# Patient Record
Sex: Female | Born: 2000 | Race: White | Hispanic: No | Marital: Married | Smoking: Never smoker
Health system: Southern US, Community
[De-identification: ages and names within clinical notes are randomized; demographics above are authoritative.]

## PROBLEM LIST (undated history)

## (undated) DIAGNOSIS — J45909 Unspecified asthma, uncomplicated: Secondary | ICD-10-CM

## (undated) DIAGNOSIS — D6800 Von Willebrand disease, unspecified: Secondary | ICD-10-CM

## (undated) DIAGNOSIS — F419 Anxiety disorder, unspecified: Secondary | ICD-10-CM

## (undated) DIAGNOSIS — M5127 Other intervertebral disc displacement, lumbosacral region: Secondary | ICD-10-CM

## (undated) DIAGNOSIS — Z1589 Genetic susceptibility to other disease: Secondary | ICD-10-CM

## (undated) DIAGNOSIS — I10 Essential (primary) hypertension: Secondary | ICD-10-CM

## (undated) DIAGNOSIS — K529 Noninfective gastroenteritis and colitis, unspecified: Secondary | ICD-10-CM

## (undated) DIAGNOSIS — K6389 Other specified diseases of intestine: Secondary | ICD-10-CM

## (undated) DIAGNOSIS — D689 Coagulation defect, unspecified: Secondary | ICD-10-CM

## (undated) DIAGNOSIS — E7212 Methylenetetrahydrofolate reductase deficiency: Secondary | ICD-10-CM

## (undated) DIAGNOSIS — T7840XA Allergy, unspecified, initial encounter: Secondary | ICD-10-CM

## (undated) HISTORY — DX: Von Willebrand's disease: D68.0

## (undated) HISTORY — DX: Von Willebrand disease, unspecified: D68.00

## (undated) HISTORY — DX: Allergy, unspecified, initial encounter: T78.40XA

## (undated) HISTORY — DX: Anxiety disorder, unspecified: F41.9

## (undated) HISTORY — DX: Other specified diseases of intestine: K63.89

## (undated) HISTORY — DX: Genetic susceptibility to other disease: Z15.89

## (undated) HISTORY — DX: Coagulation defect, unspecified: D68.9

## (undated) HISTORY — DX: Noninfective gastroenteritis and colitis, unspecified: K52.9

## (undated) HISTORY — DX: Methylenetetrahydrofolate reductase deficiency: E72.12

---
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
## 2019-01-17 ENCOUNTER — Telehealth: Payer: Self-pay

## 2019-01-17 DIAGNOSIS — Z01419 Encounter for gynecological examination (general) (routine) without abnormal findings: Secondary | ICD-10-CM

## 2019-01-17 MED ORDER — NORETHINDRONE ACET-ETHINYL EST 1-20 MG-MCG PO TABS: 1.0000 | ORAL_TABLET | Freq: Every day | ORAL | 1 refills | Status: DC

## 2019-01-17 NOTE — Telephone Encounter (Signed)
Pt has sched annual for 02/15/19 and needs refill of bc.  [phone]  Pt aware refill eRx'd.

## 2019-02-15 ENCOUNTER — Other Ambulatory Visit (HOSPITAL_COMMUNITY)
Admission: RE | Admit: 2019-02-15 | Discharge: 2019-02-15 | Disposition: A | Discharge status: Home / Self Care | Payer: BC Managed Care – PPO | Source: Ambulatory Visit | Attending: Obstetrics and Gynecology | Admitting: Obstetrics and Gynecology

## 2019-02-15 ENCOUNTER — Encounter: Payer: Self-pay | Admitting: Obstetrics and Gynecology

## 2019-02-15 ENCOUNTER — Other Ambulatory Visit: Payer: Self-pay

## 2019-02-15 ENCOUNTER — Ambulatory Visit (INDEPENDENT_AMBULATORY_CARE_PROVIDER_SITE_OTHER): Payer: BC Managed Care – PPO | Admitting: Obstetrics and Gynecology

## 2019-02-15 VITALS — BP 125/95 | HR 69 | Ht 61.0 in | Wt 168.0 lb

## 2019-02-15 DIAGNOSIS — Z30017 Encounter for initial prescription of implantable subdermal contraceptive: Secondary | ICD-10-CM | POA: Diagnosis not present

## 2019-02-15 DIAGNOSIS — Z01419 Encounter for gynecological examination (general) (routine) without abnormal findings: Secondary | ICD-10-CM

## 2019-02-15 DIAGNOSIS — Z1331 Encounter for screening for depression: Secondary | ICD-10-CM

## 2019-02-15 DIAGNOSIS — Z113 Encounter for screening for infections with a predominantly sexual mode of transmission: Secondary | ICD-10-CM

## 2019-02-15 DIAGNOSIS — Z1339 Encounter for screening examination for other mental health and behavioral disorders: Secondary | ICD-10-CM

## 2019-02-15 MED ORDER — NEXPLANON 68 MG ~~LOC~~ IMPL: 1.0000 | DRUG_IMPLANT | Freq: Once | SUBCUTANEOUS | 0 refills | Status: DC

## 2019-02-15 NOTE — Progress Notes (Signed)
Gynecology Annual Exam  PCP: Lorre Munroe, NP  Chief Complaint  Patient presents with  . Gynecologic Exam    Discuss birthcontrol options    History of Present Illness:  [name] is a 18 y.o. G0P0000 who LMP was Patient's last menstrual period was 01/29/2019., presents today for her annual examination.  Her menses are regular every 28-30 days, lasting 5 day(s).  Dysmenorrhea none. She does not have intermenstrual bleeding. She takes COCs.  She has von Willebrands Disease. She was put on contraception to help control her bleeding, given her disease state.  She used to have very heavy period, but never had a blood transfusion.    She is not sexually active.   Hx of STDs: none  There is no FH of breast cancer. There is no FH of ovarian cancer.   Tobacco use: The patient denies current or previous tobacco use. Alcohol use: none Exercise: not active  The patient wears seatbelts: yes.   The patient reports that domestic violence in her life is absent.   Patient is a 18 y.o. G0P0000 presenting for contraception consult.  She is currently on OCP (estrogen/progesterone) and desiring to start Nexplanon.  She has a past medical history significant for no contraindication to estrogen.  She specifically denies a history of migraine with aura, chronic hypertension, history of DVT/PE and smoking.  Reported Patient's last menstrual period was 01/29/2019.Marland Kitchen      Past Medical History:  Diagnosis Date  . Anxiety   . Epiploic appendagitis   . MTHFR mutation (HCC)   . Von Willebrand disease (HCC) Dr[facility] Janee [facility] at [facility]    Past Surgical History:  Procedure Laterality Date  . WISDOM TOOTH EXTRACTION  08/2018   no bleeding complications    Prior to Admission medications   Medication Sig Start Date End Date Taking? Authorizing Provider  ibuprofen (ADVIL,MOTRIN) 200 MG tablet Take 600 mg by mouth every 6 (six) hours as needed.   Yes [provider]  norethindrone-ethinyl  estradiol (JUNEL 1/20) 1-20 MG-MCG tablet Take 1 tablet by mouth daily. 01/17/19  Yes Oswaldo Conroy, CNM  ondansetron (ZOFRAN ODT) 4 MG disintegrating tablet Take 1 tablet (4 mg total) by mouth every 8 (eight) hours as needed for nausea or vomiting. 04/06/18  Yes Zehr, Princella Pellegrini, PA-C    Allergies  Allergen Reactions  . Tylenol [Acetaminophen] Other (See Comments)    fever   Obstetric History: G0P0000  Social History   Socioeconomic History  . Marital status: Single    Spouse name: Not on file  . Number of children: Not on file  . Years of education: Not on file  . Highest education level: Not on file  Occupational History  . Not on file  Social Needs  . Financial resource strain: Not on file  . Food insecurity    Worry: Not on file    Inability: Not on file  . Transportation needs    Medical: Not on file    Non-medical: Not on file  Tobacco Use  . Smoking status: Never Smoker  . Smokeless tobacco: Never Used  Substance and Sexual Activity  . Alcohol use: No    Alcohol/week: 0.0 standard drinks  . Drug use: No  . Sexual activity: Never    Birth control/protection: Pill  Lifestyle  . Physical activity    Days per week: Not on file    Minutes per session: Not on file  . Stress: Not on file  Relationships  .  Social Herbalist on phone: Not on file    Gets together: Not on file    Attends religious service: Not on file    Active member of club or organization: Not on file    Attends meetings of clubs or organizations: Not on file    Relationship status: Not on file  . Intimate partner violence    Fear of current or ex partner: Not on file    Emotionally abused: Not on file    Physically abused: Not on file    Forced sexual activity: Not on file  Other Topics Concern  . Not on file  Social History Narrative  . Not on file    Family History  Problem Relation Age of Onset  . Arthritis Maternal Grandmother   . Hyperlipidemia Maternal Grandmother    . CAD Maternal Grandfather   . Cancer Paternal Grandmother        melanoma, sinus cancer  . Colon polyps Paternal Grandmother   . Esophageal cancer Neg Hx   . Stomach cancer Neg Hx   . Liver disease Neg Hx     Review of Systems  Constitutional: Negative.   HENT: Negative.   Eyes: Negative.   Respiratory: Negative.   Cardiovascular: Negative.   Gastrointestinal: Negative.   Genitourinary: Negative.   Musculoskeletal: Negative.   Skin: Negative.   Neurological: Negative.   Psychiatric/Behavioral: Negative.      Physical Exam BP (!) 125/95 (BP Location: Left Arm, Patient Position: Sitting, Cuff Size: Normal)   Pulse 69   Ht 5\' 1"  (1.549 m)   Wt 168 lb (76.2 kg)   LMP 01/29/2019   BMI 31.74 kg/m    Physical Exam Constitutional:      General: She is not in acute distress.    Appearance: Normal appearance. She is well-developed.  Genitourinary:     Genitourinary Comments: Pelvic exam deferred given age and virginal status  HENT:     Head: Normocephalic and atraumatic.  Eyes:     General: No scleral icterus.    Conjunctiva/sclera: Conjunctivae normal.  Neck:     Musculoskeletal: Normal range of motion and neck supple.  Cardiovascular:     Rate and Rhythm: Normal rate and regular rhythm.     Heart sounds: No murmur. No friction rub. No gallop.   Pulmonary:     Effort: Pulmonary effort is normal. No respiratory distress.     Breath sounds: Normal breath sounds. No wheezing or rales.  Abdominal:     General: Bowel sounds are normal. There is no distension.     Palpations: Abdomen is soft. There is no mass.     Tenderness: There is no abdominal tenderness. There is no guarding or rebound.  Musculoskeletal: Normal range of motion.  Neurological:     General: No focal deficit present.     Mental Status: She is alert and oriented to person, place, and time.     Cranial Nerves: No cranial nerve deficit.  Skin:    General: Skin is warm and dry.     Findings: No  erythema.  Psychiatric:        Mood and Affect: Mood normal.        Behavior: Behavior normal.        Judgment: Judgment normal.     GYNECOLOGY PROCEDURE NOTE  Patient is a 18 y.o. G0P0000 presenting for Nexplanon insertion as her desires means of contraception.  She provided informed consent, signed copy in the chart,  time out was performed. Pregnancy test was not done, with self reported LMP of Patient's last menstrual period was 01/29/2019.  She understands that Nexplanon is a progesterone only therapy, and that patients often patients have irregular and unpredictable vaginal bleeding or amenorrhea. She understands that other side effects are possible related to systemic progesterone, including but not limited to, headaches, breast tenderness, nausea, and irritability. While effective at preventing pregnancy long acting reversible contraceptives do not prevent transmission of sexually transmitted diseases and use of barrier methods for this purpose was discussed. The placement procedure for Nexplanon was reviewed with the patient in detail including risks of nerve injury, infection, bleeding and injury to other muscles or tendons. She understands that the Nexplanon implant is good for 3 years and needs to be removed at the end of that time.  She understands that Nexplanon is an extremely effective option for contraception, with failure rate of <1%. This information is reviewed today and all questions were answered. Informed consent was obtained, both verbally and written.   The patient is healthy and has no contraindications to Nexplanon use. Urine pregnancy test was performed today and was negative.  Procedure Appropriate time out taken.  Patient placed in dorsal supine with left arm above head, elbow flexed at 90 degrees, arm resting on examination table with hand behind her head.  The bicipital grove was palpated and site 8-10cm proximal to the medial epicondyle was indentified.  Per the  manufacturer's recommendations, the insertion site was marked along a line 3-5 cm posterior (toward the triceps) to the bicipital groove and at 8-10 cm medial to the medial epicondyle. The insertion site was prepped with a two betadine swabs and then injected with 2 cc of 1% lidocaine without epinephrine.  Nexplanon removed form sterile blister packaging,  Device confirmed in needle, before inserting full length of needle, tenting up the skin as the needle was advance.  The drug eluting rod was then deployed by pulling back the slider per the manufactures recommendation.  The implant was palpable by the clinician as well as the patient.  The insertion site covered dressed with a 1/2" steri-strip before applying  a kerlex bandage pressure dressing..Minimal blood loss was noted during the procedure.  The patient tolerated the procedure well.   She was instructed to wear the bandage for 24 hours, call with any signs of infection.  She was given the Nexplanon card and instructed to have the rod removed in 3 years.  Results: AUDIT Questionnaire (screen for alcoholism): 0 PHQ-9: 0   Assessment: 18 y.o. G0P0000 female here for routine annual gynecologic examination  Plan: Problem List Items Addressed This Visit    None    Visit Diagnoses    Women's annual routine gynecological examination    -  Primary   Relevant Orders   Cervicovaginal ancillary only   Screening for depression       Screening for alcoholism       Screen for STD (sexually transmitted disease)       Relevant Orders   Cervicovaginal ancillary only   Nexplanon insertion          Screening: -- Blood pressure screen elevated: continued to monitor. -- Weight screening: obese: discussed management options, including lifestyle, dietary, and exercise. -- Depression screening negative (PHQ-9) -- Nutrition: normal -- cholesterol screening: not due for screening -- osteoporosis screening: due - will order DEXA -- tobacco screening:  not using -- alcohol screening: AUDIT questionnaire indicates low-risk usage. -- family history  of breast cancer screening: done. not at high risk. -- no evidence of domestic violence or intimate partner violence. -- STD screening: gonorrhea/chlamydia NAAT collected -- pap smear not collected per ASCCP guidelines -- HPV vaccination series: discussed. She will consider.   Thomasene Mohair, MD 02/15/2019 1:29 PM

## 2019-02-27 LAB — CERVICOVAGINAL ANCILLARY ONLY
Chlamydia: NEGATIVE
Comment: NEGATIVE
Comment: NORMAL
Neisseria Gonorrhea: NEGATIVE

## 2019-06-28 ENCOUNTER — Ambulatory Visit (INDEPENDENT_AMBULATORY_CARE_PROVIDER_SITE_OTHER): Payer: BC Managed Care – PPO | Admitting: Family Medicine

## 2019-06-28 ENCOUNTER — Encounter: Payer: Self-pay | Admitting: Family Medicine

## 2019-06-28 DIAGNOSIS — H6980 Other specified disorders of Eustachian tube, unspecified ear: Secondary | ICD-10-CM | POA: Diagnosis not present

## 2019-06-28 DIAGNOSIS — K6389 Other specified diseases of intestine: Secondary | ICD-10-CM | POA: Insufficient documentation

## 2019-06-28 DIAGNOSIS — R110 Nausea: Secondary | ICD-10-CM | POA: Diagnosis not present

## 2019-06-28 MED ORDER — ONDANSETRON 4 MG PO TBDP: 4.0000 mg | ORAL_TABLET | Freq: Three times a day (TID) | ORAL | 0 refills | Status: DC | PRN

## 2019-06-28 NOTE — Progress Notes (Signed)
I connected with Tamara Mcclain on 06/28/19 at 10:00 AM EST by video and verified that I am speaking with the correct person using two identifiers.   I discussed the limitations, risks, security and privacy concerns of performing an evaluation and management service by video and the availability of in person appointments. I also discussed with the patient that there may be a patient responsible charge related to this service. The patient expressed understanding and agreed to proceed.  Patient location: Home Provider Location: [facility] Participants: Lynnda Child and Tamara Mcclain   Subjective:     Tamara Mcclain is a 19 y.o. female presenting for Nausea (ears feel stopped. 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  HENT: Negative for congestion.   Respiratory: Negative for cough and shortness of breath.   Gastrointestinal: Positive for nausea. Negative for abdominal pain, constipation, diarrhea and vomiting.  Musculoskeletal: Negative for myalgias.     Social History   Tobacco Use  Smoking Status Never Smoker  Smokeless Tobacco Never Used        Objective:   BP Readings from Last 3 Encounters:  02/15/19 (!) 125/95  04/09/18 (!) 152/95  04/06/18 110/70   Wt Readings from Last 3 Encounters:  02/15/19 168 lb (76.2 kg) (91 %, Z= 1.35)*  04/09/18 165 lb 3.2 oz (74.9  kg) (91 %, Z= 1.34)*  04/06/18 165 lb 3.2 oz (74.9 kg) (91 %, Z= 1.34)*   * Growth percentiles are based on CDC (Girls, 2-20 Years) data.    There were no vitals taken for this visit.   Physical Exam Constitutional:      Appearance: Normal appearance. She is not ill-appearing.  HENT:     Head: Normocephalic and atraumatic.     Right Ear: External ear normal.     Left Ear: External ear normal.  Eyes:     Conjunctiva/sclera: Conjunctivae normal.  Pulmonary:     Effort: Pulmonary effort is normal. No respiratory distress.  Neurological:     Mental Status: She is alert. Mental status is at baseline.  Psychiatric:        Mood and Affect: Mood normal.        Behavior: Behavior normal.        Thought Content: Thought content normal.        Judgment: Judgment normal.            Assessment & Plan:   Problem List Items Addressed This Visit      Digestive   Epiploic appendagitis     Other   Nausea without vomiting - Primary    Other Visit Diagnoses    Dysfunction of Eustachian tube, unspecified laterality         Possible food related nausea - zofran prn Ear fullness improving and no other  symptoms to suggest URI - watch and wait, can try allergy medication or pseudoephedrine if needed  Return if symptoms worsen or fail to improve.  Lesleigh Noe, MD

## 2019-06-28 NOTE — Patient Instructions (Addendum)
#  Nausea - small sips of fluid - bland diet - zofran as needed  #Ear Fullness - Watch and wait - if not improving - can try allergy medication (Claritin, zyrtec) and or sinus decongestant like Pseudoephedrine

## 2019-10-24 ENCOUNTER — Telehealth: Payer: Self-pay

## 2019-10-24 ENCOUNTER — Encounter: Payer: Self-pay | Admitting: Internal Medicine

## 2019-10-24 ENCOUNTER — Other Ambulatory Visit: Payer: Self-pay

## 2019-10-24 ENCOUNTER — Ambulatory Visit: Payer: BC Managed Care – PPO | Admitting: Internal Medicine

## 2019-10-24 VITALS — BP 120/82 | HR 84 | Temp 98.1°F | Wt 187.0 lb

## 2019-10-24 DIAGNOSIS — R110 Nausea: Secondary | ICD-10-CM | POA: Diagnosis not present

## 2019-10-24 DIAGNOSIS — R1030 Lower abdominal pain, unspecified: Secondary | ICD-10-CM

## 2019-10-24 DIAGNOSIS — K6389 Other specified diseases of intestine: Secondary | ICD-10-CM | POA: Diagnosis not present

## 2019-10-24 DIAGNOSIS — T50905A Adverse effect of unspecified drugs, medicaments and biological substances, initial encounter: Secondary | ICD-10-CM

## 2019-10-24 LAB — POC URINALSYSI DIPSTICK (AUTOMATED)
Bilirubin, UA: NEGATIVE
Glucose, UA: NEGATIVE
Ketones, UA: NEGATIVE
Nitrite, UA: NEGATIVE
Protein, UA: NEGATIVE
Spec Grav, UA: 1.02 (ref 1.010–1.025)
Urobilinogen, UA: 0.2 E.U./dL
pH, UA: 6 (ref 5.0–8.0)

## 2019-10-24 LAB — LIPASE: Lipase: 10 U/L — ABNORMAL LOW (ref 11.0–59.0)

## 2019-10-24 LAB — CBC
HCT: 43.3 % (ref 36.0–46.0)
Hemoglobin: 14.4 g/dL (ref 12.0–15.0)
MCHC: 33.3 g/dL (ref 30.0–36.0)
MCV: 82.2 fl (ref 78.0–100.0)
Platelets: 334 10*3/uL (ref 150.0–400.0)
RBC: 5.27 Mil/uL — ABNORMAL HIGH (ref 3.87–5.11)
RDW: 14.1 % (ref 11.5–14.6)
WBC: 8.7 10*3/uL (ref 4.5–10.5)

## 2019-10-24 LAB — COMPREHENSIVE METABOLIC PANEL
ALT: 16 U/L (ref 0–35)
AST: 22 U/L (ref 0–37)
Albumin: 4.2 g/dL (ref 3.5–5.2)
Alkaline Phosphatase: 85 U/L (ref 39–117)
BUN: 8 mg/dL (ref 6–23)
CO2: 27 mEq/L (ref 19–32)
Calcium: 9.4 mg/dL (ref 8.4–10.5)
Chloride: 105 mEq/L (ref 96–112)
Creatinine, Ser: 0.75 mg/dL (ref 0.40–1.20)
GFR: 98.24 mL/min (ref >60.00)
Glucose, Bld: 97 mg/dL (ref 70–99)
Potassium: 4.2 mEq/L (ref 3.5–5.1)
Sodium: 137 mEq/L (ref 135–145)
Total Bilirubin: 1.2 mg/dL (ref 0.2–1.2)
Total Protein: 7.3 g/dL (ref 6.0–8.3)

## 2019-10-24 LAB — AMYLASE: Amylase: 29 U/L (ref 27–131)

## 2019-10-24 MED ORDER — DIPHENHYDRAMINE HCL 12.5 MG/5ML PO ELIX
25.0000 mg | ORAL_SOLUTION | Freq: Once | ORAL | Status: AC
  Administered 2019-10-24: 25 mg via ORAL

## 2019-10-24 MED ORDER — ONDANSETRON 4 MG PO TBDP
4.0000 mg | ORAL_TABLET | Freq: Once | ORAL | Status: AC
  Administered 2019-10-24: 4 mg via ORAL

## 2019-10-24 MED ORDER — NAPROXEN 500 MG PO TABS: 500.0000 mg | ORAL_TABLET | Freq: Two times a day (BID) | ORAL | 0 refills | Status: DC

## 2019-10-24 MED ORDER — METHYLPREDNISOLONE ACETATE 80 MG/ML IJ SUSP
80.0000 mg | Freq: Once | INTRAMUSCULAR | Status: AC
  Administered 2019-10-24: 80 mg via INTRAMUSCULAR

## 2019-10-24 MED ORDER — DIPHENHYDRAMINE HCL 12.5 MG/5ML PO ELIX: 25.0000 mg | ORAL_SOLUTION | Freq: Once | ORAL | Status: DC

## 2019-10-24 NOTE — Addendum Note (Signed)
Addended by: Roena Malady on: 10/24/2019 03:39 PM   Modules accepted: Orders

## 2019-10-24 NOTE — Telephone Encounter (Signed)
Addressed as part of visit.

## 2019-10-24 NOTE — Patient Instructions (Signed)

## 2019-10-24 NOTE — Progress Notes (Addendum)
Subjective:    Patient ID: Tamara Mcclain, female    DOB: February 18, 2001, 19 y.o.   MRN: [id number]  HPI  Pt presents to the clinic today with c/o bilateral abdominal pain. She reports this started 1 week on the right side and has now radiated to the left. She describes the pain as pins and needles but can be sharp and stabbing at times. The pain is constant on the left, intermittent on the right. She reports nausea, but denies reflux, constipation, diarrhea or blood in her stool. She denies urinary or vaginal complaints. She has a Nexplanon and does not routinely get a regular menses. She is not sexually active. She has tried Ibuprofen OTC with minimal relief. She has been diagnosed with Epiploic Appendigitis and she reports this feels the same.  Review of Systems      Past Medical History:  Diagnosis Date  . Anxiety   . Epiploic appendagitis   . MTHFR mutation (HCC)   . Von Willebrand disease (HCC) Dr. Janee Morn at [facility]    Current Outpatient Medications  Medication Sig Dispense Refill  . etonogestrel (NEXPLANON) 68 MG IMPL implant 1 each (68 mg total) by Subdermal route once for 1 dose. 1 each 0  . ibuprofen (ADVIL,MOTRIN) 200 MG tablet Take 600 mg by mouth every 6 (six) hours as needed.    . ondansetron (ZOFRAN ODT) 4 MG disintegrating tablet Take 1 tablet (4 mg total) by mouth every 8 (eight) hours as needed for nausea or vomiting. 30 tablet 0   No current facility-administered medications for this visit.    Allergies  Allergen Reactions  . Tylenol [Acetaminophen] Other (See Comments)    fever    Family History  Problem Relation Age of Onset  . Arthritis Maternal Grandmother   . Hyperlipidemia Maternal Grandmother   . CAD Maternal Grandfather   . Cancer Paternal Grandmother        melanoma, sinus cancer  . Colon polyps Paternal Grandmother   . Esophageal cancer Neg Hx   . Stomach cancer Neg Hx   . Liver disease Neg Hx     Social History   Socioeconomic History  .  Marital status: Single    Spouse name: Not on file  . Number of children: Not on file  . Years of education: Not on file  . Highest education level: Not on file  Occupational History  . Not on file  Tobacco Use  . Smoking status: Never Smoker  . Smokeless tobacco: Never Used  Vaping Use  . Vaping Use: Never used  Substance and Sexual Activity  . Alcohol use: No    Alcohol/week: 0.0 standard drinks  . Drug use: No  . Sexual activity: Never    Birth control/protection: Pill  Other Topics Concern  . Not on file  Social History Narrative  . Not on file   Social Determinants of Health   Financial Resource Strain:   . Difficulty of Paying Living Expenses:   Food Insecurity:   . Worried About Programme researcher, broadcasting/film/video in the Last Year:   . Barista in the Last Year:   Transportation Needs:   . Freight forwarder (Medical):   Marland Kitchen Lack of Transportation (Non-Medical):   Physical Activity:   . Days of Exercise per Week:   . Minutes of Exercise per Session:   Stress:   . Feeling of Stress :   Social Connections:   . Frequency of Communication with Friends and Family:   .  Frequency of Social Gatherings with Friends and Family:   . Attends Religious Services:   . Active Member of Clubs or Organizations:   . Attends Archivist Meetings:   Marland Kitchen Marital Status:   Intimate Partner Violence:   . Fear of Current or Ex-Partner:   . Emotionally Abused:   Marland Kitchen Physically Abused:   . Sexually Abused:      Constitutional: Denies fever, malaise, fatigue, headache or abrupt weight changes.  Respiratory: Denies difficulty breathing, shortness of breath, cough or sputum production.   Cardiovascular: Denies chest pain, chest tightness, palpitations or swelling in the hands or feet.  Gastrointestinal: Pt reports abdominal pain and nausea.  Denies bloating, constipation, diarrhea or blood in the stool.  GU: Denies urgency, frequency, pain with urination, burning sensation, blood in  urine, odor or discharge.  No other specific complaints in a complete review of systems (except as listed in HPI above).  Objective:   Physical Exam  BP 120/82   Pulse 84   Temp 98.1 F (36.7 C) (Temporal)   Wt 187 lb (84.8 kg)   SpO2 98%   BMI 35.33 kg/m   Wt Readings from Last 3 Encounters:  02/15/19 168 lb (76.2 kg) (91 %, Z= 1.35)*  04/09/18 165 lb 3.2 oz (74.9 kg) (91 %, Z= 1.34)*  04/06/18 165 lb 3.2 oz (74.9 kg) (91 %, Z= 1.34)*   * Growth percentiles are based on CDC (Girls, 2-20 Years) data.    General: Appears her stated age, obese, in NAD. Skin: Warm, dry and intact. No rashes noted. Cardiovascular: Normal rate and rhythm. S1,S2 noted.  No murmur, rubs or gallops noted.  Pulmonary/Chest: Normal effort and positive vesicular breath sounds. No respiratory distress. No wheezes, rales or ronchi noted.  Abdomen: Soft and tender in the RLQ, LUQ, LLQ. Normal bowel sounds. No distention or masses noted. Liver, spleen and kidneys non palpable. Musculoskeletal:  No difficulty with gait.  Neurological: Alert and oriented.    BMET    Component Value Date/Time   NA 141 04/09/2018 2151   K 3.6 04/09/2018 2151   CL 107 04/09/2018 2151   CO2 25 04/09/2018 2151   GLUCOSE 123 (H) 04/09/2018 2151   BUN 10 04/09/2018 2151   CREATININE 0.55 04/09/2018 2151   CALCIUM 9.2 04/09/2018 2151   GFRNONAA >60 04/09/2018 2151   GFRAA >60 04/09/2018 2151    Lipid Panel  No results found for: CHOL, TRIG, HDL, CHOLHDL, VLDL, LDLCALC  CBC    Component Value Date/Time   WBC 12.5 (H) 04/09/2018 2151   RBC 4.86 04/09/2018 2151   HGB 13.7 04/09/2018 2151   HCT 41.1 04/09/2018 2151   PLT 368 04/09/2018 2151   MCV 84.6 04/09/2018 2151   MCH 28.2 04/09/2018 2151   MCHC 33.3 04/09/2018 2151   RDW 12.2 04/09/2018 2151   LYMPHSABS 2.6 04/06/2018 1101   MONOABS 0.8 04/06/2018 1101   EOSABS 0.2 04/06/2018 1101   BASOSABS 0.1 04/06/2018 1101    Hgb A1C No results found for:  HGBA1C         Assessment & Plan:   Bilateral Lower Abdominal Pain, Nausea:  Urinalysis: trace leuks, trace blood Will send urine culture CBC, CMET, Amylase and Lipase 80 mg Depo Medrol IM today -had mild reaction of flushing, lightheadedness and nausea. Zofran 4 mg and Benadryl 25 mg PO x 1 given RX for Naproxen 500 mg BID x 1 week If pain worsens, will obtain CT scan abd/pelvis  Will follow  up after labs, return precautions discussed Nicki Reaper, NP This visit occurred during the SARS-CoV-2 public health emergency.  Safety protocols were in place, including screening questions prior to the visit, additional usage of staff PPE, and extensive cleaning of exam room while observing appropriate contact time as indicated for disinfecting solutions.

## 2019-10-24 NOTE — Telephone Encounter (Signed)
Carrie from front desk came and said pt was at her window and said she felt nauseated, her mouth was swelling and pt felt like she was going to pass out. Morey Hummingbird advised pt to sit down and came and told me; I advised Morey Hummingbird to let Avie Echevaria NP know; I grabbed the BP kit and went to waiting area and was about to take BP and Melanie CMA came and got pt to take back to exam room.

## 2019-10-24 NOTE — Addendum Note (Signed)
Addended by: Roena Malady on: 10/24/2019 04:35 PM   Modules accepted: Orders

## 2019-10-24 NOTE — Addendum Note (Signed)
Addended by: Tawnya Crook on: 10/24/2019 12:06 PM   Modules accepted: Orders

## 2019-10-25 LAB — URINE CULTURE
MICRO NUMBER:: 10593135
SPECIMEN QUALITY:: ADEQUATE

## 2019-11-09 ENCOUNTER — Other Ambulatory Visit: Payer: Self-pay

## 2019-11-09 ENCOUNTER — Ambulatory Visit: Payer: BC Managed Care – PPO | Admitting: Internal Medicine

## 2019-11-09 ENCOUNTER — Encounter: Payer: Self-pay | Admitting: Internal Medicine

## 2019-11-09 VITALS — BP 122/78 | HR 104 | Temp 97.2°F | Wt 189.0 lb

## 2019-11-09 DIAGNOSIS — K6389 Other specified diseases of intestine: Secondary | ICD-10-CM | POA: Diagnosis not present

## 2019-11-09 DIAGNOSIS — R112 Nausea with vomiting, unspecified: Secondary | ICD-10-CM

## 2019-11-09 MED ORDER — PROMETHAZINE HCL 12.5 MG RE SUPP: 12.5000 mg | Freq: Four times a day (QID) | RECTAL | 0 refills | Status: DC | PRN

## 2019-11-09 NOTE — Patient Instructions (Signed)
Nausea, Adult Nausea is feeling sick to your stomach or feeling that you are about to throw up (vomit). Feeling sick to your stomach is usually not serious, but it may be an early sign of a more serious medical problem. As you feel sicker to your stomach, you may throw up. If you throw up, or if you are not able to drink enough fluids, there is a risk that you may lose too much water in your body (get dehydrated). If you lose too much water in your body, you may:  Feel tired.  Feel thirsty.  Have a dry mouth.  Have cracked lips.  Go pee (urinate) less often. Older adults and people who have other diseases or a weak body defense system (immune system) have a higher risk of losing too much water in the body. The main goals of treating this condition are:  To relieve your nausea.  To ensure your nausea occurs less often.  To prevent throwing up and losing too much fluid. Follow these instructions at home: Watch your symptoms for any changes. Tell your doctor about them. Follow these instructions as told by your doctor. Eating and drinking      Take an ORS (oral rehydration solution). This is a drink that is sold at pharmacies and stores.  Drink clear fluids in small amounts as you are able. These include: ? Water. ? Ice chips. ? Fruit juice that has water added (diluted fruit juice). ? Low-calorie sports drinks.  Eat bland, easy-to-digest foods in small amounts as you are able, such as: ? Bananas. ? Applesauce. ? Rice. ? Low-fat (lean) meats. ? Toast. ? Crackers.  Avoid drinking fluids that have a lot of sugar or caffeine in them. This includes energy drinks, sports drinks, and soda.  Avoid alcohol.  Avoid spicy or fatty foods. General instructions  Take over-the-counter and prescription medicines only as told by your doctor.  Rest at home while you get better.  Drink enough fluid to keep your pee (urine) pale yellow.  Take slow and deep breaths when you feel  sick to your stomach.  Avoid food or things that have strong smells.  Wash your hands often with soap and water. If you cannot use soap and water, use hand sanitizer.  Make sure that all people in your home wash their hands well and often.  Keep all follow-up visits as told by your doctor. This is important. Contact a doctor if:  You feel sicker to your stomach.  You feel sick to your stomach for more than 2 days.  You throw up.  You are not able to drink fluids without throwing up.  You have new symptoms.  You have a fever.  You have a headache.  You have muscle cramps.  You have a rash.  You have pain while peeing.  You feel light-headed or dizzy. Get help right away if:  You have pain in your chest, neck, arm, or jaw.  You feel very weak or you pass out (faint).  You have throw up that is bright red or looks like coffee grounds.  You have bloody or black poop (stools) or poop that looks like tar.  You have a very bad headache, a stiff neck, or both.  You have very bad pain, cramping, or bloating in your belly (abdomen).  You have trouble breathing or you are breathing very quickly.  Your heart is beating very quickly.  Your skin feels cold and clammy.  You feel confused.    You have signs of losing too much water in your body, such as: ? Dark pee, very little pee, or no pee. ? Cracked lips. ? Dry mouth. ? Sunken eyes. ? Sleepiness. ? Weakness. These symptoms may be an emergency. Do not wait to see if the symptoms will go away. Get medical help right away. Call your local emergency services (911 in the [place] Do not drive yourself to the hospital. Summary  Nausea is feeling sick to your stomach or feeling that you are about to throw up (vomit).  If you throw up, or if you are not able to drink enough fluids, there is a risk that you may lose too much water in your body (get dehydrated).  Eat and drink what your doctor tells you. Take  over-the-counter and prescription medicines only as told by your doctor.  Contact a doctor right away if your symptoms get worse or you have new symptoms.  Keep all follow-up visits as told by your doctor. This is important. This information is not intended to replace advice given to you by your health care provider. Make sure you discuss any questions you have with your health care provider. Document Revised: 10/05/2017 Document Reviewed: 10/05/2017 Elsevier Patient Education  2020 Elsevier Inc.  

## 2019-11-09 NOTE — Progress Notes (Signed)
Subjective:    Patient ID: Tamara Mcclain, female    DOB: 15-Sep-2000, 19 y.o.   MRN: [id number]  HPI  Pt presents to the clinic today with c/o nausea and vomiting. This started 2 days ago. She has been unable to keep food or water down. She had some loose stools on Tuesday but denies constipation or blood in her stool She denies fever, chills or body aches. She reports the vomiting is better but she is still nauseated. She was seen 6/15 for nausea, vomiting and abdminal pain. Labs were unremarkable. 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 She has taken Zofran and Ibuprofen OTC with minimal relief.  Review of Systems      Past Medical History:  Diagnosis Date  . Anxiety   . Epiploic appendagitis   . MTHFR mutation (HCC)   . Von Willebrand disease (HCC) Dr. Janee Morn at [facility]    Current Outpatient Medications  Medication Sig Dispense Refill  . etonogestrel (NEXPLANON) 68 MG IMPL implant 1 each (68 mg total) by Subdermal route once for 1 dose. 1 each 0  . ibuprofen (ADVIL,MOTRIN) 200 MG tablet Take 600 mg by mouth every 6 (six) hours as needed.    . naproxen (NAPROSYN) 500 MG tablet Take 1 tablet (500 mg total) by mouth 2 (two) times daily with a meal. 14 tablet 0  . ondansetron (ZOFRAN ODT) 4 MG disintegrating tablet Take 1 tablet (4 mg total) by mouth every 8 (eight) hours as needed for nausea or vomiting. 30 tablet 0   No current facility-administered medications for this visit.    Allergies  Allergen Reactions  . Tylenol [Acetaminophen] Other (See Comments)    fever  . Depo-Medrol [Methylprednisolone] Nausea Only    Feeling flushed    Family History  Problem Relation Age of Onset  . Arthritis Maternal Grandmother   . Hyperlipidemia Maternal Grandmother   . CAD Maternal Grandfather   . Cancer Paternal Grandmother        melanoma, sinus cancer  . Colon polyps Paternal Grandmother   . Esophageal cancer Neg Hx   . Stomach cancer Neg Hx   . Liver disease Neg Hx      Social History   Socioeconomic History  . Marital status: Single    Spouse name: Not on file  . Number of children: Not on file  . Years of education: Not on file  . Highest education level: Not on file  Occupational History  . Not on file  Tobacco Use  . Smoking status: Never Smoker  . Smokeless tobacco: Never Used  Vaping Use  . Vaping Use: Never used  Substance and Sexual Activity  . Alcohol use: No    Alcohol/week: 0.0 standard drinks  . Drug use: No  . Sexual activity: Never    Birth control/protection: Pill  Other Topics Concern  . Not on file  Social History Narrative  . Not on file   Social Determinants of Health   Financial Resource Strain:   . Difficulty of Paying Living Expenses:   Food Insecurity:   . Worried About Programme researcher, broadcasting/film/video in the Last Year:   . Barista in the Last Year:   Transportation Needs:   . Freight forwarder (Medical):   Marland Kitchen Lack of Transportation (Non-Medical):   Physical Activity:   . Days of Exercise per Week:   . Minutes of Exercise per Session:   Stress:   . Feeling of Stress :   Social  Connections:   . Frequency of Communication with Friends and Family:   . Frequency of Social Gatherings with Friends and Family:   . Attends Religious Services:   . Active Member of Clubs or Organizations:   . Attends Banker Meetings:   Marland Kitchen Marital Status:   Intimate Partner Violence:   . Fear of Current or Ex-Partner:   . Emotionally Abused:   Marland Kitchen Physically Abused:   . Sexually Abused:      Constitutional: Denies fever, malaise, fatigue, headache or abrupt weight changes.  Respiratory: Denies difficulty breathing, shortness of breath, cough or sputum production.   Cardiovascular: Denies chest pain, chest tightness, palpitations or swelling in the hands or feet.  Gastrointestinal: Pt reports nausea, vomiting and loose stools. Denies abdominal pain, bloating, constipation, diarrhea or blood in the stool.  GU:  Denies urgency, frequency, pain with urination, burning sensation, blood in urine, odor or discharge.  No other specific complaints in a complete review of systems (except as listed in HPI above).  Objective:   Physical Exam  BP 122/78   Pulse (!) 104   Temp (!) 97.2 F (36.2 C) (Temporal)   Wt 189 lb (85.7 kg)   SpO2 97%   BMI 35.71 kg/m   Wt Readings from Last 3 Encounters:  10/24/19 187 lb (84.8 kg)  02/15/19 168 lb (76.2 kg) (91 %, Z= 1.35)*  04/09/18 165 lb 3.2 oz (74.9 kg) (91 %, Z= 1.34)*   * Growth percentiles are based on CDC (Girls, 2-20 Years) data.    General: Appears her stated age, obese, in NAD. Cardiovascular: Tachycardic with normal rhythm. S1,S2 noted.  No murmur, rubs or gallops noted. No JVD or BLE edema. No carotid bruits noted. Pulmonary/Chest: Normal effort and positive vesicular breath sounds. No respiratory distress. No wheezes, rales or ronchi noted.  Abdomen: Soft and nontender. Normal bowel sounds. No distention or masses noted. Liver, spleen and kidneys non palpable. Neurological: Alert and oriented.     BMET    Component Value Date/Time   NA 137 10/24/2019 1058   K 4.2 10/24/2019 1058   CL 105 10/24/2019 1058   CO2 27 10/24/2019 1058   GLUCOSE 97 10/24/2019 1058   BUN 8 10/24/2019 1058   CREATININE 0.75 10/24/2019 1058   CALCIUM 9.4 10/24/2019 1058   GFRNONAA >60 04/09/2018 2151   GFRAA >60 04/09/2018 2151    Lipid Panel  No results found for: CHOL, TRIG, HDL, CHOLHDL, VLDL, LDLCALC  CBC    Component Value Date/Time   WBC 8.7 10/24/2019 1058   RBC 5.27 (H) 10/24/2019 1058   HGB 14.4 10/24/2019 1058   HCT 43.3 10/24/2019 1058   PLT 334.0 10/24/2019 1058   MCV 82.2 10/24/2019 1058   MCH 28.2 04/09/2018 2151   MCHC 33.3 10/24/2019 1058   RDW 14.1 10/24/2019 1058   LYMPHSABS 2.6 04/06/2018 1101   MONOABS 0.8 04/06/2018 1101   EOSABS 0.2 04/06/2018 1101   BASOSABS 0.1 04/06/2018 1101    Hgb A1C No results found for:  HGBA1C      Assessment & Plan:   Nausea, Vomiting and Epiploic Appendagitis:  No need for CT abd/pelvis today ? Food poisoning vs [facility] vs epiploic appendagitis RX for Phenergan 12.5 mg suppositories Referral to GI for further evaluation  ER precautions discussed    Nicki Reaper, NP This visit occurred during the SARS-CoV-2 public health emergency.  Safety protocols were in place, including screening questions prior to the visit, additional usage of staff  PPE, and extensive cleaning of exam room while observing appropriate contact time as indicated for disinfecting solutions.

## 2019-11-14 ENCOUNTER — Encounter: Payer: Self-pay | Admitting: Internal Medicine

## 2019-11-14 DIAGNOSIS — K6389 Other specified diseases of intestine: Secondary | ICD-10-CM

## 2019-11-14 DIAGNOSIS — R110 Nausea: Secondary | ICD-10-CM

## 2019-11-29 ENCOUNTER — Ambulatory Visit: Payer: BC Managed Care – PPO | Admitting: Advanced Practice Midwife

## 2019-12-05 ENCOUNTER — Ambulatory Visit: Payer: BC Managed Care – PPO | Admitting: Family Medicine

## 2019-12-05 ENCOUNTER — Other Ambulatory Visit: Payer: Self-pay

## 2019-12-05 ENCOUNTER — Encounter: Payer: Self-pay | Admitting: Family Medicine

## 2019-12-05 VITALS — BP 123/69 | HR 101 | Temp 98.0°F | Wt 184.5 lb

## 2019-12-05 DIAGNOSIS — F410 Panic disorder [episodic paroxysmal anxiety] without agoraphobia: Secondary | ICD-10-CM

## 2019-12-05 MED ORDER — HYDROXYZINE HCL 25 MG PO TABS: 25.0000 mg | ORAL_TABLET | Freq: Three times a day (TID) | ORAL | 0 refills | Status: DC | PRN

## 2019-12-05 NOTE — Progress Notes (Signed)
Subjective:     Tamara Mcclain is a 19 y.o. 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 -- but denies extensive worry - no major life changes - lives with BF and gets along well with him - no depression - denies hx of general anxiety but endorses worrying about a lot of things "what if" - endorses that she worries about paying bills - recently lost her job and school    Does not get regular exercise Drinks more mountain dew than water - trying to decrease  Review of Systems   Social History   Tobacco Use  Smoking Status Never Smoker  Smokeless Tobacco Never Used        Objective:    BP Readings from Last 3 Encounters:  12/05/19 123/69  11/09/19 122/78  10/24/19 120/82   Wt Readings from Last 3 Encounters:  12/05/19 184 lb 8 oz (83.7 kg)  11/09/19 189 lb (85.7 kg)  10/24/19 187 lb (84.8 kg)    BP 123/69   Pulse 101   Temp 98 F (36.7 C) (Temporal)   Wt 184 lb 8 oz (83.7 kg)   SpO2 100%   BMI 34.86 kg/m    Physical Exam Constitutional:      General: She is not in acute distress.    Appearance: She is well-developed. She is not diaphoretic.  HENT:     Right Ear: External ear normal.     Left Ear: External ear normal.     Nose: Nose normal.  Eyes:     Conjunctiva/sclera: Conjunctivae normal.  Cardiovascular:     Rate and Rhythm: Normal rate and regular rhythm.     Heart sounds: No murmur heard.   Pulmonary:     Effort: Pulmonary effort is normal. No respiratory distress.     Breath sounds:  Normal breath sounds. No wheezing.  Musculoskeletal:     Cervical back: Neck supple.  Skin:    General: Skin is warm and dry.     Capillary Refill: Capillary refill takes less than 2 seconds.  Neurological:     Mental Status: She is alert. Mental status is at baseline.  Psychiatric:        Mood and Affect: Mood normal.        Behavior: Behavior normal.     GAD 7 : Generalized Anxiety Score 12/05/2019  Nervous, Anxious, on Edge 3  Control/stop worrying 2  Worry too much - different things 2  Trouble relaxing 3  Restless 2  Easily annoyed or irritable 3  Afraid - awful might happen 3  Total GAD 7 Score 18  Anxiety Difficulty Somewhat difficult           Assessment & Plan:   Problem List Items Addressed This Visit      Other   Panic attacks - Primary    Symptoms seem consistent with panic attacks and does have some recent life stressors (not working, in school currently) and  GAD elevated. Seems she probably has some baseline Generalized anxiety, but manages well w/o medication until the panic episodes started happening over the last 2 weeks. Hydroxyzine (counseled on sedation) trial and hand out and discussed mindfulness practice. She will look into therapy and phone number provided but due to financial stress this is not a good option for her. Recommended f/u if persisting to consider daily medication.       Relevant Medications   hydrOXYzine (ATARAX/VISTARIL) 25 MG tablet       Return if symptoms worsen or fail to improve.  Lynnda Child, MD  This visit occurred during the SARS-CoV-2 public health emergency.  Safety protocols were in place, including screening questions prior to the visit, additional usage of staff PPE, and extensive cleaning of exam room while observing appropriate contact time as indicated for disinfecting solutions.

## 2019-12-05 NOTE — Assessment & Plan Note (Signed)
Symptoms seem consistent with panic attacks and does have some recent life stressors (not working, in school currently) and GAD elevated. Seems she probably has some baseline Generalized anxiety, but manages well w/o medication until the panic episodes started happening over the last 2 weeks. Hydroxyzine (counseled on sedation) trial and hand out and discussed mindfulness practice. She will look into therapy and phone number provided but due to financial stress this is not a good option for her. Recommended f/u if persisting to consider daily medication.

## 2019-12-05 NOTE — Patient Instructions (Signed)
How to help anxiety - without medication.  ? ?1) Regular Exercise - walking, jogging, cycling, dancing, strength training ?--> Yoga has been shown in research to reduce depression and anxiety -- with even just one hour long session per week ? ?2)  Begin a Mindfulness/Meditation practice -- this can take a little as 3 minutes and is helpful for all kinds of mood issues ?-- You can find resources in books ?-- Or you can download apps like  ?---- Headspace App (which currently has free content called "Weathering the Storm") ?---- Calm (which has a few free options)  ?---- Insignt Timer ?---- Stop, Breathe & Think ? ?# With each of these Apps - you should decline the "start free trial" offer and as you search through the App should be able to access some of their free content. You can also chose to pay for the content if you find one that works well for you.  ? ?# Many of them also offer sleep specific content which may help with insomnia ? ?3) Healthy Diet ?-- Avoid or decrease Caffeine ?-- Avoid or decrease Alcohol ?-- Drink plenty of water, have a balanced diet ?-- Avoid cigarettes and marijuana (as well as other recreational drugs) ? ?4) Consider contacting a professional therapist  ?-- [facility] is one option. Call [phone] ?-- Or you can check out www.psychologytoday.com -- you can read bios of therapists and see if they accept insurance ?-- Check with your insurance to see if you have coverage and who may take your insurance ?  ? ? ?

## 2020-01-01 ENCOUNTER — Other Ambulatory Visit: Payer: Self-pay

## 2020-01-01 ENCOUNTER — Ambulatory Visit (INDEPENDENT_AMBULATORY_CARE_PROVIDER_SITE_OTHER): Payer: BC Managed Care – PPO | Admitting: Gastroenterology

## 2020-01-01 VITALS — BP 135/97 | HR 93 | Temp 98.2°F | Ht 61.0 in | Wt 189.0 lb

## 2020-01-01 DIAGNOSIS — R112 Nausea with vomiting, unspecified: Secondary | ICD-10-CM

## 2020-01-01 DIAGNOSIS — R1032 Left lower quadrant pain: Secondary | ICD-10-CM

## 2020-01-01 MED ORDER — OMEPRAZOLE 40 MG PO CPDR
40.0000 mg | DELAYED_RELEASE_CAPSULE | Freq: Every day | ORAL | 1 refills | Status: DC
Start: 2020-01-01 — End: 2020-07-09

## 2020-01-01 NOTE — Progress Notes (Signed)
Wyline Mood MD, MRCP(U.K) [address]  [address]  [place], [place] [address]  Main: [phone]  Fax: [phone]   Gastroenterology Consultation  Referring Provider:     Lorre Munroe, NP Primary Care Physician:  Lorre Munroe, NP Primary Gastroenterologist:  Dr. Wyline Mood  Reason for Consultation:     Epiploic appendagitis nausea and vomiting        HPI:   Tamara Mcclain is a 19 y.o. y/o female referred for consultation & management  by  Lorre Munroe, NP.    She has been referred for abdominal pain.  At least going on since June 2021.  Last CT scan of the abdomen in December 2019 was normal.  Associated [facility] Me[facility] CT scan in October 2019 which was performed showed features suggestive of epiploic appendagitis.  She was seen in the emergency room in June 2021 at [facility] for abdominal pain of 2 days duration.  Nonbloody emesis.  Felt to be gastroenteritis.  Hemoglobin is 14.5 g with an MCV of 81.7 platelet count of 369 with protein and leukocyte esterase present in the urine.  Large quantity of blood.  LFTs were normal.  Her last urine cultures in June 2021 suggesting possible contamination.  She felt that she did not develop quite a while of 2019 and then the abdominal pain returned.  She describes it in both her flanks.  On and off.  Most days right after she eats within 30 minutes and last for about 30 minutes.  No clear other aggravating or relieving factors.  Has a bowel movement every other day.  Not relieved by bowel movement.  Takes ibuprofen 2 tablets a day for the last 2 months which helps the pain temporarily but not too much.  Denies any marijuana use.  Nausea at times but no vomiting.  Father had issues with her gallbladder.  She has not lost weight but rather gained weight recently.  Pain is sharp in nature.  Nonradiating.  Past Medical History:  Diagnosis Date  . Anxiety   . Epiploic appendagitis   . MTHFR mutation (HCC)   . Von Willebrand disease (HCC) Dr.  Janee Morn at [facility]    Past Surgical History:  Procedure Laterality Date  . WISDOM TOOTH EXTRACTION  08/2018   no bleeding complications    Prior to Admission medications   Medication Sig Start Date End Date Taking? Authorizing Provider  etonogestrel (NEXPLANON) 68 MG IMPL implant 1 each by Subdermal route once.    [provider]  hydrOXYzine (ATARAX/VISTARIL) 25 MG tablet Take 1-2 tablets (25-50 mg total) by mouth 3 (three) times daily as needed for anxiety. 12/05/19   Lynnda Child, MD  ibuprofen (ADVIL,MOTRIN) 200 MG tablet Take 600 mg by mouth every 6 (six) hours as needed.    [provider]  ondansetron (ZOFRAN ODT) 4 MG disintegrating tablet Take 1 tablet (4 mg total) by mouth every 8 (eight) hours as needed for nausea or vomiting. 06/28/19   Lynnda Child, MD    Family History  Problem Relation Age of Onset  . Arthritis Maternal Grandmother   . Hyperlipidemia Maternal Grandmother   . CAD Maternal Grandfather   . Cancer Paternal Grandmother        melanoma, sinus cancer  . Colon polyps Paternal Grandmother   . Esophageal cancer Neg Hx   . Stomach cancer Neg Hx   . Liver disease Neg Hx      Social History   Tobacco Use  .  Smoking status: Never Smoker  . Smokeless tobacco: Never Used  Vaping Use  . Vaping Use: Never used  Substance Use Topics  . Alcohol use: No    Alcohol/week: 0.0 standard drinks  . Drug use: No    Allergies as of 01/01/2020 - Review Complete 12/05/2019  Allergen Reaction Noted  . Tylenol [acetaminophen] Other (See Comments) 08/18/2016  . Depo-medrol [methylprednisolone] Nausea Only 10/24/2019    Review of Systems:    All systems reviewed and negative except where noted in HPI.   Physical Exam:  There were no vitals taken for this visit. No LMP recorded. Patient has had an implant. Psych:  Alert and cooperative. Normal mood and affect. General:   Alert,  Well-developed, well-nourished, pleasant and cooperative in  NAD Head:  Normocephalic and atraumatic. Eyes:  Sclera clear, no icterus.   Conjunctiva pink. Ears:  Normal auditory acuity. Lungs:  Respirations even and unlabored.  Clear throughout to auscultation.   No wheezes, crackles, or rhonchi. No acute distress. Heart:  Regular rate and rhythm; no murmurs, clicks, rubs, or gallops. Abdomen:  Normal bowel sounds.  No bruits.  Soft, mild epigastric tenderness and non-distended without masses, hepatosplenomegaly or hernias noted.  No guarding or rebound tenderness.    Neurologic:  Alert and oriented x3;  grossly normal neurologically. Psych:  Alert and cooperative. Normal mood and affect.  Imaging Studies: No results found.  Assessment and Plan:   Tamara Mcclain is a 19 y.o. y/o female has been referred for epiploic appendagitis.  Diagnosed by CT scan in 2019.  Repeat CT scan subsequently 2 months later showed a normal study.The treatment of which is generally conservative with NSAIDs.  Surgery is reserved for those who failed to improve with conservative management or with worsening symptoms.  Her symptoms presently could be related to gastritis versus dyspepsia versus gallbladder related.  Plan 1.  Obtain CT scan of the abdomen since her symptoms are ongoing since 2 years slightly worse at this point of time 2.  Obtain HIDA scan to evaluate gallbladder. 3.  Stop all NSAIDs as it may cause an ulcer.  Commenced on PPI Prilosec 40 mg once a day 4.  Check H. pylori breath test 5.  If above are negative and the issue continues may need endoscopic evaluation down the road.   Follow up in 3 to 4 weeks  Dr Wyline Mood MD,MRCP(U.K)

## 2020-01-03 LAB — H. PYLORI BREATH TEST: H pylori Breath Test: NEGATIVE

## 2020-01-04 ENCOUNTER — Encounter: Payer: Self-pay | Admitting: Gastroenterology

## 2020-01-04 ENCOUNTER — Other Ambulatory Visit: Payer: Self-pay

## 2020-01-04 ENCOUNTER — Encounter: Payer: Self-pay | Admitting: Emergency Medicine

## 2020-01-04 ENCOUNTER — Emergency Department
Admission: EM | Admit: 2020-01-04 | Discharge: 2020-01-04 | Disposition: A | Discharge status: Home / Self Care | Payer: BC Managed Care – PPO | Attending: Emergency Medicine | Admitting: Emergency Medicine

## 2020-01-04 DIAGNOSIS — R1010 Upper abdominal pain, unspecified: Secondary | ICD-10-CM | POA: Diagnosis present

## 2020-01-04 DIAGNOSIS — Z5321 Procedure and treatment not carried out due to patient leaving prior to being seen by health care provider: Secondary | ICD-10-CM | POA: Insufficient documentation

## 2020-01-04 NOTE — Telephone Encounter (Signed)
Yes we can give a letter explaining not attending school on August 23 rd

## 2020-01-04 NOTE — ED Triage Notes (Signed)
Patient presents to the ED with abdominal pain.  Patient states she ate a piece of plain bread today and she immediately started having upper abdominal pain.  Patient has GI history but states this feels differently than her normal pain/issues.  Patient denies vomiting and diarrhea at this time.  Patient appears uncomfortable.

## 2020-01-11 ENCOUNTER — Other Ambulatory Visit: Payer: Self-pay

## 2020-01-11 ENCOUNTER — Encounter
Admission: RE | Admit: 2020-01-11 | Discharge: 2020-01-11 | Disposition: A | Discharge status: Home / Self Care | Payer: BC Managed Care – PPO | Source: Ambulatory Visit | Attending: Gastroenterology | Admitting: Gastroenterology

## 2020-01-11 DIAGNOSIS — R112 Nausea with vomiting, unspecified: Secondary | ICD-10-CM | POA: Diagnosis present

## 2020-01-11 MED ORDER — TECHNETIUM TC 99M MEBROFENIN IV KIT
4.7800 | PACK | Freq: Once | INTRAVENOUS | Status: AC | PRN
  Administered 2020-01-11: 4.78 via INTRAVENOUS

## 2020-01-16 ENCOUNTER — Encounter: Payer: Self-pay | Admitting: Gastroenterology

## 2020-01-18 ENCOUNTER — Telehealth: Payer: Self-pay | Admitting: Gastroenterology

## 2020-01-19 ENCOUNTER — Other Ambulatory Visit: Payer: Self-pay

## 2020-01-19 DIAGNOSIS — R1032 Left lower quadrant pain: Secondary | ICD-10-CM

## 2020-01-19 NOTE — Telephone Encounter (Signed)
Ok schedule EGD- if negative will try CT scan again

## 2020-01-22 ENCOUNTER — Ambulatory Visit: Payer: BC Managed Care – PPO

## 2020-01-26 ENCOUNTER — Other Ambulatory Visit: Payer: Self-pay

## 2020-01-26 ENCOUNTER — Other Ambulatory Visit
Admission: RE | Admit: 2020-01-26 | Discharge: 2020-01-26 | Disposition: A | Discharge status: Home / Self Care | Payer: BC Managed Care – PPO | Source: Ambulatory Visit | Attending: Gastroenterology | Admitting: Gastroenterology

## 2020-01-26 DIAGNOSIS — Z20822 Contact with and (suspected) exposure to covid-19: Secondary | ICD-10-CM | POA: Diagnosis not present

## 2020-01-26 DIAGNOSIS — Z01812 Encounter for preprocedural laboratory examination: Secondary | ICD-10-CM | POA: Insufficient documentation

## 2020-01-27 LAB — SARS CORONAVIRUS 2 (TAT 6-24 HRS): SARS Coronavirus 2: NEGATIVE

## 2020-01-30 ENCOUNTER — Encounter
Admission: RE | Disposition: A | Discharge status: Home / Self Care | Payer: Self-pay | Source: Home / Self Care | Attending: Gastroenterology

## 2020-01-30 ENCOUNTER — Ambulatory Visit: Payer: BC Managed Care – PPO | Admitting: Anesthesiology

## 2020-01-30 ENCOUNTER — Other Ambulatory Visit: Payer: Self-pay

## 2020-01-30 ENCOUNTER — Ambulatory Visit
Admission: RE | Admit: 2020-01-30 | Discharge: 2020-01-30 | Disposition: A | Discharge status: Home / Self Care | Payer: BC Managed Care – PPO | Attending: Gastroenterology | Admitting: Gastroenterology

## 2020-01-30 ENCOUNTER — Encounter: Payer: Self-pay | Admitting: Gastroenterology

## 2020-01-30 DIAGNOSIS — Z886 Allergy status to analgesic agent status: Secondary | ICD-10-CM | POA: Diagnosis not present

## 2020-01-30 DIAGNOSIS — D680 Von Willebrand's disease: Secondary | ICD-10-CM | POA: Insufficient documentation

## 2020-01-30 DIAGNOSIS — R1032 Left lower quadrant pain: Secondary | ICD-10-CM | POA: Diagnosis not present

## 2020-01-30 DIAGNOSIS — K2970 Gastritis, unspecified, without bleeding: Secondary | ICD-10-CM | POA: Diagnosis not present

## 2020-01-30 DIAGNOSIS — R109 Unspecified abdominal pain: Secondary | ICD-10-CM | POA: Insufficient documentation

## 2020-01-30 DIAGNOSIS — Z888 Allergy status to other drugs, medicaments and biological substances status: Secondary | ICD-10-CM | POA: Insufficient documentation

## 2020-01-30 HISTORY — PX: ESOPHAGOGASTRODUODENOSCOPY (EGD) WITH PROPOFOL: SHX5813

## 2020-01-30 LAB — POCT PREGNANCY, URINE: Preg Test, Ur: NEGATIVE

## 2020-01-30 SURGERY — ESOPHAGOGASTRODUODENOSCOPY (EGD) WITH PROPOFOL
Anesthesia: General

## 2020-01-30 MED ORDER — PROPOFOL 10 MG/ML IV BOLUS
INTRAVENOUS | Status: DC | PRN
  Administered 2020-01-30: 40 mg via INTRAVENOUS
  Administered 2020-01-30: 20 mg via INTRAVENOUS

## 2020-01-30 MED ORDER — PROPOFOL 500 MG/50ML IV EMUL
INTRAVENOUS | Status: DC | PRN
  Administered 2020-01-30: 180 ug/kg/min via INTRAVENOUS

## 2020-01-30 MED ORDER — MIDAZOLAM HCL 2 MG/2ML IJ SOLN
INTRAMUSCULAR | Status: AC
  Filled 2020-01-30: qty 2

## 2020-01-30 MED ORDER — ONDANSETRON 4 MG PO TBDP
4.0000 mg | ORAL_TABLET | Freq: Once | ORAL | Status: AC
  Administered 2020-01-30: 4 mg via ORAL

## 2020-01-30 MED ORDER — PROPOFOL 10 MG/ML IV BOLUS
INTRAVENOUS | Status: AC
  Filled 2020-01-30: qty 20

## 2020-01-30 MED ORDER — SODIUM CHLORIDE 0.9 % IV SOLN: INTRAVENOUS | Status: DC

## 2020-01-30 MED ORDER — PROPOFOL 500 MG/50ML IV EMUL
INTRAVENOUS | Status: AC
  Filled 2020-01-30: qty 50

## 2020-01-30 MED ORDER — ONDANSETRON 4 MG PO TBDP
ORAL_TABLET | ORAL | Status: AC
  Filled 2020-01-30: qty 1

## 2020-01-30 MED ORDER — MIDAZOLAM HCL 2 MG/2ML IJ SOLN
INTRAMUSCULAR | Status: DC | PRN
  Administered 2020-01-30: 1 mg via INTRAVENOUS

## 2020-01-30 MED ORDER — ONDANSETRON HCL 4 MG/2ML IJ SOLN
INTRAMUSCULAR | Status: DC | PRN
  Administered 2020-01-30: 4 mg via INTRAVENOUS

## 2020-01-30 NOTE — Anesthesia Postprocedure Evaluation (Signed)
Anesthesia Post Note  Patient: Tamara Mcclain  Procedure(s) Performed: ESOPHAGOGASTRODUODENOSCOPY (EGD) WITH PROPOFOL (N/A )  Patient location during evaluation: Endoscopy Anesthesia Type: General Level of consciousness: awake and alert Pain management: pain level controlled Vital Signs Assessment: post-procedure vital signs reviewed and stable Respiratory status: spontaneous breathing, nonlabored ventilation, respiratory function stable and patient connected to nasal cannula oxygen Cardiovascular status: blood pressure returned to baseline and stable Postop Assessment: no apparent nausea or vomiting Anesthetic complications: no   No complications documented.   Last Vitals:  Vitals:   01/30/20 1015 01/30/20 1025  BP: 110/61 108/65  Pulse: 79 71  Resp: 18 17  Temp:    SpO2: 99% 100%    Last Pain:  Vitals:   01/30/20 1025  TempSrc:   PainSc: 0-No pain                 Corinda Gubler

## 2020-01-30 NOTE — Op Note (Signed)
[facility] Gastroenterology Patient Name: Tamara Mcclain Procedure Date: 01/30/2020 9:38 AM MRN: [id number] Account #: [id number] Date of Birth: February 23, 2001 Admit Type: Outpatient Age: 19 Room: [facility] ENDO ROOM 1 Gender: Female Note Status: Finalized Procedure:             Upper GI endoscopy Indications:           Abdominal pain Providers:             Wyline Mood MD, MD Referring MD:          Lorre Munroe (Referring MD) Medicines:             Monitored Anesthesia Care Complications:         No immediate complications. Procedure:             Pre-Anesthesia Assessment:                        - Prior to the procedure, a History and Physical was                         performed, and patient medications, allergies and                         sensitivities were reviewed. The patient's tolerance                         of previous anesthesia was reviewed.                        - The risks and benefits of the procedure and the                         sedation options and risks were discussed with the                         patient. All questions were answered and informed                         consent was obtained.                        - ASA Grade Assessment: II - A patient with mild                         systemic disease.                        After obtaining informed consent, the endoscope was                         passed under direct vision. Throughout the procedure,                         the patient's blood pressure, pulse, and oxygen                         saturations were monitored continuously. The Endoscope                         was introduced through the mouth, and advanced  to the                         third part of duodenum. The upper GI endoscopy was                         accomplished with ease. The patient tolerated the                         procedure well. Findings:      The examined duodenum was normal.      The esophagus was  normal.      The entire examined stomach was normal. Biopsies were taken with a cold       forceps for histology.      The cardia and gastric fundus were normal on retroflexion. Impression:            - Normal examined duodenum.                        - Normal esophagus.                        - Normal stomach. Biopsied. Recommendation:        - Await pathology results.                        - Discharge patient to home (with escort).                        - Resume previous diet today.                        - Continue present medications.                        - Return to my office in 3 weeks. Procedure Code(s):     --- Professional ---                        [phone], Esophagogastroduodenoscopy, flexible,                         transoral; with biopsy, single or multiple Diagnosis Code(s):     --- Professional ---                        R10.9, Unspecified abdominal pain CPT copyright 2019 American Medical Association. All rights reserved. The codes documented in this report are preliminary and upon coder review may  be revised to meet current compliance requirements. Wyline Mood, MD Wyline Mood MD, MD 01/30/2020 9:50:27 AM This report has been signed electronically. Number of Addenda: 0 Note Initiated On: 01/30/2020 9:38 AM Estimated Blood Loss:  Estimated blood loss: none.      [facility]

## 2020-01-30 NOTE — Anesthesia Preprocedure Evaluation (Signed)
Anesthesia Evaluation  Patient identified by MRN, date of birth, ID band Patient awake    Reviewed: Allergy & Precautions, NPO status , Patient's Chart, lab work & pertinent test results  History of Anesthesia Complications Negative for: history of anesthetic complications  Airway Mallampati: II  TM Distance: >3 FB Neck ROM: Full    Dental no notable dental hx. (+) Teeth Intact   Pulmonary neg pulmonary ROS, neg sleep apnea, neg COPD, Patient abstained from smoking.Not current smoker,    Pulmonary exam normal breath sounds clear to auscultation       Cardiovascular Exercise Tolerance: Good METS(-) hypertension(-) CAD and (-) Past MI negative cardio ROS  (-) dysrhythmias  Rhythm:Regular Rate:Normal - Systolic murmurs    Neuro/Psych PSYCHIATRIC DISORDERS Anxiety negative neurological ROS     GI/Hepatic neg GERD  ,(+)     (-) substance abuse  ,   Endo/Other  neg diabetes  Renal/GU negative Renal ROS     Musculoskeletal   Abdominal   Peds  Hematology  (+) Blood dyscrasia, , Patient had heme workup in 2014 for heavy periods, normal blood work, but patient had genetic mutation that could have been a hypercoagulable state, but peds heme note says this mutation not associated with increased risk of clotting. No definitive diagnosis of Von willibrand disease made in those consult notes. Patient does say she bleeds from her gums sometimes when she brushes her teeth.   Anesthesia Other Findings Past Medical History: No date: Anxiety No date: Epiploic appendagitis No date: MTHFR mutation (HCC) Dr. Janee Morn at [facility]: Von Willebrand disease [facility])  Reproductive/Obstetrics                             Anesthesia Physical Anesthesia Plan  ASA: II  Anesthesia Plan: General   Post-op Pain Management:    Induction: Intravenous  PONV Risk Score and Plan: 3 and Ondansetron, Propofol infusion and  TIVA  Airway Management Planned: Nasal Cannula  Additional Equipment: None  Intra-op Plan:   Post-operative Plan:   Informed Consent: I have reviewed the patients History and Physical, chart, labs and discussed the procedure including the risks, benefits and alternatives for the proposed anesthesia with the patient or authorized representative who has indicated his/her understanding and acceptance.     Dental advisory given  Plan Discussed with: CRNA and Surgeon  Anesthesia Plan Comments: (Discussed risks of anesthesia with patient, including possibility of difficulty with spontaneous ventilation under anesthesia necessitating airway intervention, PONV, and rare risks such as cardiac or respiratory or neurological events. Patient understands.)        Anesthesia Quick Evaluation

## 2020-01-30 NOTE — Transfer of Care (Signed)
Immediate Anesthesia Transfer of Care Note  Patient: Tamara Mcclain  Procedure(s) Performed: ESOPHAGOGASTRODUODENOSCOPY (EGD) WITH PROPOFOL (N/A )  Patient Location: PACU  Anesthesia Type:General  Level of Consciousness: sedated  Airway & Oxygen Therapy: Patient Spontanous Breathing and Patient connected to nasal cannula oxygen  Post-op Assessment: Report given to RN and Post -op Vital signs reviewed and stable  Post vital signs: Reviewed and stable  Last Vitals:  Vitals Value Taken Time  BP 109/62 01/30/20 1005  Temp    Pulse 82 01/30/20 1009  Resp 18 01/30/20 1009  SpO2 98 % 01/30/20 1009  Vitals shown include unvalidated device data.  Last Pain:  Vitals:   01/30/20 1005  TempSrc:   PainSc: Asleep         Complications: No complications documented.

## 2020-01-30 NOTE — Anesthesia Procedure Notes (Signed)
Date/Time: 01/30/2020 9:50 AM Performed by: Henrietta Hoover, CRNA Pre-anesthesia Checklist: Patient identified, Emergency Drugs available, Suction available, Patient being monitored and Timeout performed Patient Re-evaluated:Patient Re-evaluated prior to induction Oxygen Delivery Method: Nasal cannula Placement Confirmation: positive ETCO2

## 2020-01-30 NOTE — H&P (Signed)
Wyline Mood, MD [address], [place], [place], [address] [address], [place], [place], [address] Phone: [phone]  Fax: [phone]  Primary Care Physician:  Lorre Munroe, NP   Pre-Procedure History & Physical: HPI:  Tamara Mcclain is a 19 y.o. female is here for an endoscopy    Past Medical History:  Diagnosis Date   Anxiety    Epiploic appendagitis    MTHFR mutation [facility])    Von Willebrand disease (HCC) Dr. Jane[facility] at [facility]    Past Surgical History:  Procedure Laterality Date   WISDOM TOOTH EXTRACTION  08/2018   no bleeding complications    Prior to Admission medications   Medication Sig Start Date End Date Taking? Authorizing Provider  etonogestrel (NEXPLANON) 68 MG IMPL implant 1 each by Subdermal route once.    [provider]  hydrOXYzine (ATARAX/VISTARIL) 25 MG tablet Take 1-2 tablets (25-50 mg total) by mouth 3 (three) times daily as needed for anxiety. Patient not taking: Reported on 01/01/2020 12/05/19   Lynnda Child, MD  ibuprofen (ADVIL,MOTRIN) 200 MG tablet Take 600 mg by mouth every 6 (six) hours as needed. Patient not taking: Reported on 01/30/2020    [provider]  omeprazole (PRILOSEC) 40 MG capsule Take 1 capsule (40 mg total) by mouth daily. Patient not taking: Reported on 01/30/2020 01/01/20   Wyline Mood, MD  ondansetron (ZOFRAN ODT) 4 MG disintegrating tablet Take 1 tablet (4 mg total) by mouth every 8 (eight) hours as needed for nausea or vomiting. 06/28/19   Lynnda Child, MD    Allergies as of 01/19/2020 - Review Complete 01/04/2020  Allergen Reaction Noted   Tylenol [acetaminophen] Other (See Comments) 08/18/2016   Depo-medrol [methylprednisolone] Nausea Only 10/24/2019    Family History  Problem Relation Age of Onset   Arthritis Maternal Grandmother    Hyperlipidemia Maternal Grandmother    CAD Maternal Grandfather    Cancer Paternal Grandmother        melanoma, sinus  cancer   Colon polyps Paternal Grandmother    Esophageal cancer Neg Hx    Stomach cancer Neg Hx    Liver disease Neg Hx     Social History   Socioeconomic History   Marital status: Single    Spouse name: Not on file   Number of children: Not on file   Years of education: Not on file   Highest education level: Not on file  Occupational History   Not on file  Tobacco Use   Smoking status: Never Smoker   Smokeless tobacco: Never Used  Vaping Use   Vaping Use: Never used  Substance and Sexual Activity   Alcohol use: No    Alcohol/week: 0.0 standard drinks   Drug use: No   Sexual activity: Never    Birth control/protection: Pill  Other Topics Concern   Not on file  Social History Narrative   Not on file   Social Determinants of Health   Financial Resource Strain:    Difficulty of Paying Living Expenses: Not on file  Food Insecurity:    Worried About Programme researcher, broadcasting/film/video in the Last Year: Not on file   [employer] of Food in the Last Year: Not on file  Transportation Needs:    Lack of Transportation (Medical): Not on file   Lack of Transportation (Non-Medical): Not on file  Physical Activity:    Days of Exercise per Week: Not on file   Minutes  of Exercise per Session: Not on file  Stress:    Feeling of Stress : Not on file  Social Connections:    Frequency of Communication with Friends and Family: Not on file   Frequency of Social Gatherings with Friends and Family: Not on file   Attends Religious Services: Not on file   Active Member of Clubs or Organizations: Not on file   Attends Banker Meetings: Not on file   Marital Status: Not on file  Intimate Partner Violence:    Fear of Current or Ex-Partner: Not on file   Emotionally Abused: Not on file   Physically Abused: Not on file   Sexually Abused: Not on file    Review of Systems: See HPI, otherwise negative ROS  Physical Exam: BP 105/86    Pulse 70    Temp (!)  97.4 F (36.3 C) (Temporal)    Resp 16    Ht 5\' 1"  (1.549 m)    Wt 85.7 kg    SpO2 100%    BMI 35.71 kg/m  General:   Alert,  pleasant and cooperative in NAD Head:  Normocephalic and atraumatic. Neck:  Supple; no masses or thyromegaly. Lungs:  Clear throughout to auscultation, normal respiratory effort.    Heart:  +S1, +S2, Regular rate and rhythm, No edema. Abdomen:  Soft, nontender and nondistended. Normal bowel sounds, without guarding, and without rebound.   Neurologic:  Alert and  oriented x4;  grossly normal neurologically.  Impression/Plan: Tamara Mcclain is here for an endoscopy  to be performed for  evaluation of abdominal pain    Risks, benefits, limitations, and alternatives regarding endoscopy have been reviewed with the patient.  Questions have been answered.  All parties agreeable.   [name], MD  01/30/2020, 9:22 AM

## 2020-01-31 ENCOUNTER — Encounter: Payer: Self-pay | Admitting: Gastroenterology

## 2020-01-31 LAB — SURGICAL PATHOLOGY

## 2020-02-05 ENCOUNTER — Encounter: Payer: Self-pay | Admitting: Internal Medicine

## 2020-02-22 ENCOUNTER — Ambulatory Visit: Payer: BC Managed Care – PPO | Admitting: Obstetrics and Gynecology

## 2020-03-11 ENCOUNTER — Encounter: Payer: Self-pay | Admitting: Gastroenterology

## 2020-03-13 ENCOUNTER — Ambulatory Visit (INDEPENDENT_AMBULATORY_CARE_PROVIDER_SITE_OTHER): Payer: BC Managed Care – PPO | Admitting: Gastroenterology

## 2020-03-13 ENCOUNTER — Other Ambulatory Visit: Payer: Self-pay

## 2020-03-13 ENCOUNTER — Encounter: Payer: Self-pay | Admitting: Gastroenterology

## 2020-03-13 VITALS — BP 123/84 | HR 102 | Temp 97.3°F | Ht 61.0 in | Wt 191.6 lb

## 2020-03-13 DIAGNOSIS — R319 Hematuria, unspecified: Secondary | ICD-10-CM

## 2020-03-13 DIAGNOSIS — F439 Reaction to severe stress, unspecified: Secondary | ICD-10-CM

## 2020-03-13 DIAGNOSIS — M549 Dorsalgia, unspecified: Secondary | ICD-10-CM | POA: Diagnosis not present

## 2020-03-13 DIAGNOSIS — M7918 Myalgia, other site: Secondary | ICD-10-CM | POA: Diagnosis not present

## 2020-03-13 NOTE — Progress Notes (Signed)
Wyline Mood MD, MRCP(U.K) [address]  [address]  [place], [place] [address]  Main: [phone]  Fax: [phone]   Primary Care Physician: Lorre Munroe, NP  Primary Gastroenterologist:  Dr. Wyline Mood   Chief Complaint  Patient presents with  . Follow-up    Abd pain, N/V    HPI: Tamara Mcclain is a 19 y.o. female    Summary of history :  She was initially referred and seen in 12/29/2019 for nausea vomiting along with epiploic appendagitis.  Her symptoms at least began in June 2021.CT scan of the abdomen in December 2019 was normal.   CT scan in October 2019 which was performed showed features suggestive of epiploic appendagitis.  She was seen in the emergency room in June 2021 at [facility] for abdominal pain of 2 days duration.  Nonbloody emesis.  Felt to be gastroenteritis.  Hemoglobin is 14.5 g with an MCV of 81.7 platelet count of 369 with protein and leukocyte esterase present in the urine.  Large quantity of blood.  LFTs were normal.  Her last urine cultures in June 2021 suggesting possible contamination.  At her initial visit She describes the abdominal pain  in both her flanks.  On and off.  Most days right after she eats within 30 minutes and last for about 30 minutes.  No clear other aggravating or relieving factors.  Has a bowel movement every other day.  Not relieved by bowel movement.  Takes ibuprofen 2 tablets a day enies any marijuana use.  Nausea at times but no vomiting.  Father had issues with her gallbladder.  She has not lost weight but rather gained weight recently.  Pain is sharp in nature.  Nonradiating.  Interval history   01/01/2020-03/13/2020  01/11/2020: HIDA scan: Normal ejection fraction of the gallbladder. 01/02/2020: H. pylori breath test: Negative: 01/30/2020: EGD: Normal appearance of the GI tract: Biopsies of the stomach showed focal chronic inflammation.  02/05/2020 was seen at the emergency room at [facility] with abdominal pain underwent  a CT scan of the abdomen and pelvis which demonstrated no abnormalities  02/05/2020: CMP normal, lipase normal,.  Moderate leukocyte esterase seen in the urine with moderate bacteria and trace blood  Since her last visit she states that she still has on and off right-sided pain mostly in her back area worse with anxiety and movement.  When she is stressed the pain is definitely worse.  Having regular bowel movements.  Not taking Aleve on a regular basis.  Has gained weight since her last visit.  Current Outpatient Medications  Medication Sig Dispense Refill  . etonogestrel (NEXPLANON) 68 MG IMPL implant 1 each by Subdermal route once.    . ondansetron (ZOFRAN ODT) 4 MG disintegrating tablet Take 1 tablet (4 mg total) by mouth every 8 (eight) hours as needed for nausea or vomiting. 30 tablet 0  . hydrOXYzine (ATARAX/VISTARIL) 25 MG tablet Take 1-2 tablets (25-50 mg total) by mouth 3 (three) times daily as needed for anxiety. (Patient not taking: Reported on 01/01/2020) 30 tablet 0  . ibuprofen (ADVIL,MOTRIN) 200 MG tablet Take 600 mg by mouth every 6 (six) hours as needed. (Patient not taking: Reported on 01/30/2020)    . omeprazole (PRILOSEC) 40 MG capsule Take 1 capsule (40 mg total) by mouth daily. (Patient not taking: Reported on 01/30/2020) 90 capsule 1   No current facility-administered medications for this visit.    Allergies as of 03/13/2020 - Review Complete 03/13/2020  Allergen Reaction  Noted  . Tylenol [acetaminophen] Other (See Comments) 08/18/2016  . Depo-medrol [methylprednisolone] Nausea Only 10/24/2019    ROS:  General: Negative for anorexia, weight loss, fever, chills, fatigue, weakness. ENT: Negative for hoarseness, difficulty swallowing , nasal congestion. CV: Negative for chest pain, angina, palpitations, dyspnea on exertion, peripheral edema.  Respiratory: Negative for dyspnea at rest, dyspnea on exertion, cough, sputum, wheezing.  GI: See history of present illness. GU:   Negative for dysuria, hematuria, urinary incontinence, urinary frequency, nocturnal urination.  Endo: Negative for unusual weight change.    Physical Examination:   BP 123/84 (BP Location: Left Arm, Patient Position: Sitting, Cuff Size: Large)   Pulse (!) 102   Temp (!) 97.3 F (36.3 C) (Oral)   Ht 5\' 1"  (1.549 m)   Wt 191 lb 9.6 oz (86.9 kg)   BMI 36.20 kg/m   General: Well-nourished, well-developed in no acute distress.  Eyes: No icterus. Conjunctivae pink. Abdomen: Tenderness present over the right lower ribs in the infra scapular area..  Pressure in this area replicates her pain.  Abdomen is otherwise soft and nontender. Neuro: Alert and oriented x 3.  Grossly intact. Skin: Warm and dry, no jaundice.   Psych: Alert and cooperative, normal mood and affect.   Imaging Studies: No results found.  Assessment and Plan:   Tamara Mcclain is a 19 y.o. y/o female here to follow-up for nausea vomiting and  epiploic appendagitis.    Repeat CT scan shows that it has resolved.  Her main complaint at this point of time is a right-sided pain over the right lower ribs in the infrascapular area.  This pain is worse with movement as well as on palpation suggestive of a musculoskeletal etiology.  In addition anxiety and stress seems to make the pain worse.  Plan 1.    Suggest physical therapy for her back pain. 2.  She does complain of a lot of stress/anxiety and may benefit from discussion with 26, NP for medication which can treat the same.  Medication such as amitriptyline cannot really treat the stress/anxiety but will also help with the pain as a modulator 3.  She has had recurrent blood in her urine seen previously I will recheck her urine today and if blood is still present may need to see a urologist   Dr Lorre Munroe  MD,MRCP [facility]) Follow up in as needed .

## 2020-03-14 LAB — URINALYSIS
Bilirubin, UA: NEGATIVE
Glucose, UA: NEGATIVE
Ketones, UA: NEGATIVE
Nitrite, UA: NEGATIVE
Protein,UA: NEGATIVE
Specific Gravity, UA: 1.012 (ref 1.005–1.030)
Urobilinogen, Ur: 0.2 mg/dL (ref 0.2–1.0)
pH, UA: 7 (ref 5.0–7.5)

## 2020-03-19 ENCOUNTER — Telehealth: Payer: Self-pay

## 2020-03-19 NOTE — Telephone Encounter (Signed)
-----   Message from Wyline Mood, MD sent at 03/18/2020  1:59 PM EST ----- 1+ RBC in urin suggest repeat clean catch urine in 6 weeks

## 2020-03-22 ENCOUNTER — Telehealth: Payer: Self-pay

## 2020-03-22 NOTE — Telephone Encounter (Signed)
Unable to reach pt by phone. I tried pts cell and home phone per DPR can leave v/m on home phone. pts grandfather answered and said pt does not live there and the grandmother was not available. Sending note to Pamala Hurry NP and [facility] CMA.

## 2020-03-22 NOTE — Telephone Encounter (Signed)
Can we try to reach out to her again, no appointments available today but we could offer Saturday clinic.

## 2020-03-22 NOTE — Telephone Encounter (Signed)
[facility] Night - Client TELEPHONE ADVICE RECORD AccessNurse Patient Name: Tamara Mcclain Gender: Female DOB: 06/05/2000 Age: 19 Y 8 M 6 D Return Phone Number: [phone] (Primary) [place] [address] Client  [facility] Night - Client Client Site  [facility] [place] - Night Physician Nicki Reaper - NP Contact Type Call Who Is Calling Patient / Member / Family / Caregiver Call Type Triage / Clinical Relationship To Patient Self Return Phone Number [phone] (Primary) Chief Complaint Headache Reason for Call Symptomatic / Request for Health Information Initial Comment Caller states shehas a stuffy nose as well as a headache due to sinus pressure. She states she doesn't have any energy. Translation No Nurse Assessment Nurse: Louann Liv, RN, Darl Pikes Date/Time Lamount Cohen Time): 03/21/2020 9:35:55 PM Confirm and document reason for call. If symptomatic, describe symptoms. ---Caller states she has a stuffy nose as well as a headache due to sinus pressure/congestion. She states she doesn't have any energy, is tired but denies severe weakness. Onset last night, increased today. No fever, 97.5. Denies Covid exposure. Does the patient have any new or worsening symptoms? ---Yes Will a triage be completed? ---Yes Related visit to physician within the last 2 weeks? ---No Does the PT have any chronic conditions? (i.e. diabetes, asthma, this includes High risk factors for pregnancy, etc.) ---No Is the patient pregnant or possibly pregnant? (Ask all females between the ages of 41-55) ---No Is this a behavioral health or substance abuse call? ---No Guidelines Guideline Title Affirmed Question Affirmed Notes Nurse Date/Time Lamount Cohen Time) Sinus Pain or Congestion [1] Sinus congestion as part of a cold AND [2] present < 10 days Dorinda Hill 03/21/2020 9:39:39 PM Disp. Time Lamount Cohen Time)  Disposition Final User 03/21/2020 9:45:04 PM Home Care Yes Louann Liv, RN, Helane Rima Disagree/Comply Comply PLEASE NOTE: All timestamps contained within this report are represented as [place] Standard Time. CONFIDENTIALTY NOTICE: This fax transmission is intended only for the addressee. It contains information that is legally privileged, confidential or otherwise protected from use or disclosure. If you are not the intended recipient, you are strictly prohibited from reviewing, disclosing, copying using or disseminating any of this information or taking any action in reliance on or regarding this information. If you have received this fax in error, please notify us immediately by telephone so that we can arrange for its return to [place]. Phone: [phone], Toll-Free: [phone], Fax: [phone] Page: 2 of 2 Call Id: [id number] Caller Understands Yes PreDisposition Call Pharmacist Care Advice Given Per Guideline HOME CARE: * You should be able to treat this at home. REASSURANCE AND EDUCATION: * Sinus congestion is a normal part of a cold. * Usually home treatment with nasal washes can prevent an actual bacterial sinus infection. NASAL WASHES FOR A STUFFY NOSE: * Introduction: Saline (salt water) nasal irrigation (nasal wash) is an effective and simple home remedy for treating stuffy nose and sinus congestion. The nose can be irrigated by pouring, spraying, or squirting salt water into the nose and then letting it run back out. * How it Helps: The salt water rinses out excess mucus and washes out any irritants (dust, allergens) that might be present. It also moistens the nasal cavity. NASAL WASHES - STEP-BY-STEP INSTRUCTIONS: * STEP 1: Lean over a sink. * STEP 2: Gently squirt or spray warm salt water into one of your nostrils. NASAL DECONGESTANTS FOR A VERY STUFFY NOSE: * MOST PEOPLE DO NOT NEED TO USE THESE MEDICINES. * If your  nose feels blocked, you should try using nasal washes first. * If  you have a very stuffy nose, nasal decongestant medicines can shrink the swollen nasal mucosa and allow for easier breathing. If you have a very runny nose, these medicines can reduce the amount of drainage. They may be taken as pills by mouth or as a nasal spray. * Pseudoephedrine (Sudafed): Available over-the-counter in pill form. Typical adult dosage is two 30 mg tablets every 6 hours. CAUTION - NASAL DECONGESTANTS: * Do not take these medications if you have high blood pressure, heart disease, prostate enlargement, or an overactive thyroid. PAIN MEDICINES: * For pain relief, you can take either acetaminophen, ibuprofen, or naproxen. * They are over-the-counter (OTC) pain drugs. You can buy them at the drugstore. * IBUPROFEN (E.G., MOTRIN, ADVIL): Take 400 mg (two 200 mg pills) by mouth every 6 hours. The most you should take each day is 1,200 mg (six 200 mg pills), unless your doctor has told you to take more. CALL BACK IF: * Severe pain persists over 2 hours after pain medicine * Sinus congestion (fullness) persists over 10 days * You become worse CARE ADVICE given per Sinus Pain or Congestion (Adult) guideline. Comments User: Silvio Clayman, RN Date/Time Lamount Cohen Time): 03/21/2020 9:41:08 PM patient states the pain is mild, and she has taken 2 Sudafed per pharmacist recommendation.

## 2020-03-25 NOTE — Telephone Encounter (Signed)
Spoke to pt and she reports she is feeling better with stuffy nose, advise pt OTC allergy medicine at bedtime and Flonase in AM may continue to aide in improvement in Sx... pt is aware of virtual visit is available for further evaluation if Rx intervention may be needed.

## 2020-04-01 ENCOUNTER — Telehealth: Payer: Self-pay | Admitting: Internal Medicine

## 2020-04-01 ENCOUNTER — Emergency Department
Admission: EM | Admit: 2020-04-01 | Discharge: 2020-04-01 | Disposition: A | Discharge status: Home / Self Care | Payer: BC Managed Care – PPO

## 2020-04-01 NOTE — Telephone Encounter (Signed)
[facility] Day - Client TELEPHONE ADVICE RECORD AccessNurse Patient Name: Tamara Mcclain Gender: Female DOB: June 26, 2000 Age: 19 Y 8 M 16 D Return Phone Number: [phone] (Primary) Address: [place] [address] Client [facility] Day - Client Client Site [facility] - Day Physician Nicki Reaper - NP Contact Type Call Who Is Calling Patient / Member / Family / Caregiver Call Type Triage / Clinical Relationship To Patient Self Return Phone Number [phone] (Primary) Chief Complaint ABDOMINAL PAIN - Severe and only in abdomen Reason for Call Symptomatic / Request for Health Information Initial Comment Caller states she went to ER, having severe abdominal pain from belly button to right side, dx uti, but its getting worse. Translation No Nurse Assessment Nurse: Mayford Knife, RN, Whitney Date/Time (Eastern Time): 04/01/2020 10:30:08 AM Confirm and document reason for call. If symptomatic, describe symptoms. ---Caller states she went to ER yesterday evening, having severe abdominal pain from belly button to right side, diagnosed with a UTI, but its getting worse. States they only took a urine specimen and blood work. Denies fever. Symptoms started on Friday night with pain and she started vomiting. Does the patient have any new or worsening symptoms? ---Yes Will a triage be completed? ---Yes Related visit to physician within the last 2 weeks? ---Yes Does the PT have any chronic conditions? (i.e. diabetes, asthma, this includes High risk factors for pregnancy, etc.) ---Yes List chronic conditions. ---von willebrand, epiploic appendagitis Is the patient pregnant or possibly pregnant? (Ask all females between the ages of 53-55) ---No Is this a behavioral health or substance abuse call? ---No Guidelines Guideline Title Affirmed Question Affirmed Notes Nurse Date/Time (Eastern Time) Urinary Tract  Infection on Antibiotic Follow-up Call - Female [1] SEVERE pain (e.g., excruciating) AND [2] no improvement 2 hours after pain medications Mayford Knife, RN, Whitney 04/01/2020 10:37:07 AM Disp. Time Lamount Cohen Time) Disposition Final User 04/01/2020 10:28:56 AM Send to Urgent Macon Large, Baird Lyons PLEASE NOTE: All timestamps contained within this report are represented as [place] Standard Time. CONFIDENTIALTY NOTICE: This fax transmission is intended only for the addressee. It contains information that is legally privileged, confidential or otherwise protected from use or disclosure. If you are not the intended recipient, you are strictly prohibited from reviewing, disclosing, copying using or disseminating any of this information or taking any action in reliance on or regarding this information. If you have received this fax in error, please notify us immediately by telephone so that we can arrange for its return to [place]. Phone: [phone], Toll-Free: [phone], Fax: [phone] Page: 2 of 2 Call Id: [id number] 04/01/2020 10:41:38 AM Go to ED Now (or PCP triage) Yes Mayford Knife, RN, Whitney Disposition Overriden: See HCP within 4 Hours (or PCP triage) Override Reason: Patient's symptoms need a higher level of care Caller Disagree/Comply Comply Caller Understands Yes PreDisposition InappropriateToAsk Care Advice Given Per Guideline SEE HCP (OR PCP TRIAGE) WITHIN 4 HOURS: * IF NO PCP (PRIMARY CARE PROVIDER) SECOND-LEVEL TRIAGE: You need to be seen within the next hour. Go to the ED/UCC at _____________ Hospital. Leave as soon as you can. Comments User: Hardie Lora, RN Date/Time Lamount Cohen Time): 04/01/2020 10:42:48 AM RN upgraded triage outcome due to caller report of vomiting, and concern caller needed urgent imaging of abdomen. Referrals GO TO FACILITY UNDECIDED

## 2020-04-01 NOTE — Telephone Encounter (Signed)
Patient called in states that she went to ER last night was diagnosed with UTI. Patient states that she is in even more pain on her right side going all the way to her belly button. Transferred her to Triage due to abdominal pain not improving. EM

## 2020-04-01 NOTE — Telephone Encounter (Addendum)
I spoke with pt's grandmother; pts grandmother only knows the one contact # for pt as her cell phone; pts grandmother does not know if pt went back to ED or not. I spoke with pt; pt said the right sided pain has worsened since 03/31/20. pt said ED is last resort; pt has made appt with [facility] in [place] today at 1;15 pm.pt went to [facility] ED on 03/31/20 . Now pts pain level is 9. Pt said the pain med given by ED made her abd pain worse. Pt still has appendix; pt had pregnancy test at ED that was neg. Pt said when she mashes on rt side the pain is so intense she cries. Pt is having chills; pt is not sure if fever or not. Pt said she just hit a bump in the rd and causes severe pain; pt is going to [facility] ED now; I spoke with Victorino Dike in [facility] ED traige and gave above symptoms and pt will be at [facility] in 10'. FYI to Pamala Hurry NP who is not in office and Allayne Gitelman NP who is in office.

## 2020-04-01 NOTE — Telephone Encounter (Addendum)
Noted. It looks like patient checked in at [facility] ED and then left without triage or labs. Cannot see notes from [facility] urgent care from today.  ED notes from [facility] reviewed, no CT obtained as symptoms were chronic (3 years) and CT in September 2021 was negative.   Seems like symptoms are chronic, will defer to PCP. Appropriate recommendations made earlier by our nursing staff which was for urgent ED evaluation.

## 2020-04-01 NOTE — ED Notes (Addendum)
Pt telling screener that she can not wait and needs something for the pain.  Screener advised pt that nothing can be provided, pt inquired about the wait time and when she was told we could not give out times, pt walked from building and stated she was leaving.

## 2020-04-02 NOTE — Telephone Encounter (Signed)
Can we call to see if she was evaluated

## 2020-04-03 ENCOUNTER — Encounter: Payer: Self-pay | Admitting: Internal Medicine

## 2020-05-09 ENCOUNTER — Encounter: Payer: Self-pay | Admitting: Obstetrics and Gynecology

## 2020-05-09 ENCOUNTER — Other Ambulatory Visit: Payer: Self-pay

## 2020-05-09 ENCOUNTER — Ambulatory Visit (INDEPENDENT_AMBULATORY_CARE_PROVIDER_SITE_OTHER): Payer: BC Managed Care – PPO | Admitting: Obstetrics and Gynecology

## 2020-05-09 VITALS — BP 135/86 | Ht 61.0 in | Wt 175.0 lb

## 2020-05-09 DIAGNOSIS — Z3046 Encounter for surveillance of implantable subdermal contraceptive: Secondary | ICD-10-CM

## 2020-05-09 DIAGNOSIS — Z113 Encounter for screening for infections with a predominantly sexual mode of transmission: Secondary | ICD-10-CM

## 2020-05-09 DIAGNOSIS — Z30011 Encounter for initial prescription of contraceptive pills: Secondary | ICD-10-CM

## 2020-05-09 MED ORDER — NORETHIN ACE-ETH ESTRAD-FE 1-20 MG-MCG PO TABS: 1.0000 | ORAL_TABLET | Freq: Every day | ORAL | 4 refills | Status: DC

## 2020-05-09 NOTE — Addendum Note (Signed)
Addended by: Thomasene Mohair D on: 05/09/2020 01:35 PM   Modules accepted: Orders

## 2020-05-09 NOTE — Progress Notes (Signed)
  GYNECOLOGY PROCEDURE NOTE  Nexplanon removal discussed in detail.  Risks of infection, bleeding, nerve injury all reviewed.  Patient understands risks and desires to proceed.  Verbal consent obtained.  Patient is certain she wants the Nexplanon removed.  All questions answered.  Procedure: Patient placed in dorsal supine with left arm above head, elbow flexed at 90 degrees, arm resting on examination table.  Nexplanon identified without problems.  Betadine scrub x3.  1 ml of 1% lidocaine injected under Nexplanondevice without problems.  Sterile gloves applied.  Small 0.5cm incision made at distal tip of Nexplanon device with 11 blade scalpel.  Nexplanon brought to incision and grasped with a small kelly clamp.  Nexplanon removed intact without problems.  Pressure applied to incision.  Hemostasis obtained.  Steri-strips applied, followed by bandage and compression dressing.  Patient tolerated procedure well.  No complications.   Assessment: 19 y.o. year old female now s/p uncomplicated Nexplanon removal.  Plan: 1.  Patient given post procedure precautions and asked to call for fever, chills, redness or drainage from her incision, bleeding from incision.  She understands she will likely have a small bruise near site of removal and can remove bandage tomorrow and steri-strips in approximately 1 week.  2) Contraception: Junel 1/20. She has no contraindications to estrogen.   3) STD screen sent today.  Follow up for annual after turning 19 yo.    Thomasene Mohair, MD, [facility], [facility] 05/09/2020 11:38 AM

## 2020-05-09 NOTE — Addendum Note (Signed)
Addended by: Thomasene Mohair D on: 05/09/2020 04:07 PM   Modules accepted: Orders

## 2020-05-11 LAB — GC/CHLAMYDIA PROBE AMP
Chlamydia trachomatis, NAA: NEGATIVE
Neisseria Gonorrhoeae by PCR: NEGATIVE

## 2020-06-17 ENCOUNTER — Ambulatory Visit (INDEPENDENT_AMBULATORY_CARE_PROVIDER_SITE_OTHER): Payer: BC Managed Care – PPO | Admitting: Obstetrics and Gynecology

## 2020-06-17 ENCOUNTER — Encounter: Payer: Self-pay | Admitting: Obstetrics and Gynecology

## 2020-06-17 ENCOUNTER — Other Ambulatory Visit: Payer: Self-pay

## 2020-06-17 VITALS — BP 124/78 | Ht 61.0 in | Wt 176.0 lb

## 2020-06-17 DIAGNOSIS — Z3169 Encounter for other general counseling and advice on procreation: Secondary | ICD-10-CM

## 2020-06-17 DIAGNOSIS — R6889 Other general symptoms and signs: Secondary | ICD-10-CM

## 2020-06-17 DIAGNOSIS — E66811 Obesity, class 1: Secondary | ICD-10-CM

## 2020-06-17 DIAGNOSIS — D6800 Von Willebrand disease, unspecified: Secondary | ICD-10-CM

## 2020-06-17 DIAGNOSIS — N926 Irregular menstruation, unspecified: Secondary | ICD-10-CM | POA: Diagnosis not present

## 2020-06-17 DIAGNOSIS — E669 Obesity, unspecified: Secondary | ICD-10-CM

## 2020-06-17 DIAGNOSIS — Z6833 Body mass index (BMI) 33.0-33.9, adult: Secondary | ICD-10-CM

## 2020-06-17 NOTE — Progress Notes (Signed)
Gynecology H&P  PCP: Jearld Fenton, NP  Chief Complaint:  Chief Complaint  Patient presents with  . Consult    Fertility consult - RM 6    History of Present Illness: Patient is a 20 y.o. G0P0000 presenting for evaluation of infertility. Patient and partner have been attempting unprotected intercourse for 2 months and actively trying for conception for 2 months.  Pregnancies with current partner no  Sexual History Dyspareunia: no Use of Lubricant: no Douching: no  Ovulatory Evaluation LMP: Patient's last menstrual period was 05/21/2020. Menarche:11 Interval were irregular prior to having Nexplanon placed.  She has history of heavy menstrual cycles dating back to her teens with work up at that time showing Von Willebrands disease. Heavy Menses: yes Clots: yes Intermenstrual Bleeding: no Dysmenorrhea: no  Molimina yes Ovulation Predictor Kits Positive  no  PCOS  Wt Change: no Hirsutism: no Acne: no  Hyperprolactinemia Galactorrhea: no Headaches: no Vision Changes:  no  Thyroid Temperature Intolerance: yes cold interolerance Constipation or Diarrhea: no Hair Thinning:  no Palpitation:  no  Prior treatments Meds: none Other Therapies: Not applicable  Premature Ovarian Failure Family history of autism, mental retardation, or fragile X: no Family history of premature ovarian failure or early menopause: no Prior radiation or chemotherapy exposoure:  no  Tubal Factor Previous abdominal or pelvic surgery: no Pelvic Pain:  no Endometriosis: no STD: no PID: no Laparoscopy: no Prior HSG: no  Female Factor Sired prior conception:  no Semen analysis: no  Contraception Nexplanon removed 05/09/2020  Contributing Habits Cigarettes:    Wife -  no    Husband - no Alcohol:    Wife -  no    Husband - no Marijuana: denies  Review of Systems: 10 point review of systems negative unless otherwise noted in HPI  Past Medical History:  Patient Active  Problem List   Diagnosis Date Noted  . Panic attacks 12/05/2019  . 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 Nausea without vomiting 04/06/2018  . Von Willebrand disease ([place]) 08/18/2016    Past Surgical History:  Past Surgical History:  Procedure Laterality Date  . ESOPHAGOGASTRODUODENOSCOPY (EGD) WITH PROPOFOL N/A 01/30/2020   Procedure: ESOPHAGOGASTRODUODENOSCOPY (EGD) WITH PROPOFOL;  Surgeon: Jonathon Bellows, MD;  Location: [facility] ENDOSCOPY;  Service: Gastroenterology;  Laterality: N/A;  . WISDOM TOOTH EXTRACTION  08/2018   no bleeding complications    Obstetric History:  Never pregnant  Family History:  Family History  Problem Relation Age of Onset  . Arthritis Maternal Grandmother   . Hyperlipidemia Maternal Grandmother   . CAD Maternal Grandfather   . Cancer Paternal Grandmother        melanoma, sinus cancer  . Colon polyps Paternal Grandmother   . Esophageal cancer Neg Hx   . Stomach cancer Neg Hx   . Liver disease Neg Hx    Thyroid Problems: no Heart Condition or High Blood Pressure: no Blood Clot or Stroke: no Diabetes: no Birth Defects/Inherited diseases:no Other Medical Problems: no  Social History:  Social History   Socioeconomic History  . Marital status: Single    Spouse name: Not on file  . Number of children: Not on file  . Years of education: Not on file  . Highest education level: Not on file  Occupational History  . Not on file  Tobacco Use  . Smoking status: Never Smoker  . Smokeless tobacco: Never Used  Vaping Use  . Vaping Use: Never used  Substance and Sexual Activity  . Alcohol use:  No    Alcohol/week: 0.0 standard drinks  . Drug use: No  . Sexual activity: Yes    Birth control/protection: None  Other Topics Concern  . Not on file  Social History Narrative  . Not on file   Social Determinants of Health   Financial Resource Strain: Not on file  Food Insecurity: Not on file  Transportation Needs: Not on file  Physical Activity: Not on  file  Stress: Not on file  Social Connections: Not on file  Intimate Partner Violence: Not on file    Allergies:  Allergies  Allergen Reactions  . Tylenol [Acetaminophen] Other (See Comments)    fever  . Depo-Medrol [Methylprednisolone] Nausea Only    Feeling flushed    Medications: Prior to Admission medications   Medication Sig Start Date End Date Taking? Authorizing Provider  ibuprofen (ADVIL) 200 MG tablet Take by mouth.   Yes [provider]  hydrOXYzine (ATARAX/VISTARIL) 25 MG tablet Take 1-2 tablets (25-50 mg total) by mouth 3 (three) times daily as needed for anxiety. Patient not taking: Reported on 01/01/2020 12/05/19   Lesleigh Noe, MD  ibuprofen (ADVIL,MOTRIN) 200 MG tablet Take 600 mg by mouth every 6 (six) hours as needed. Patient not taking: Reported on 01/30/2020    [provider]  norethindrone-ethinyl estradiol (JUNEL FE 1/20) 1-20 MG-MCG tablet Take 1 tablet by mouth daily. Patient not taking: Reported on 06/17/2020 05/09/20   Will Bonnet, MD  omeprazole (PRILOSEC) 40 MG capsule Take 1 capsule (40 mg total) by mouth daily. Patient not taking: Reported on 01/30/2020 01/01/20   Jonathon Bellows, MD  ondansetron (ZOFRAN ODT) 4 MG disintegrating tablet Take 1 tablet (4 mg total) by mouth every 8 (eight) hours as needed for nausea or vomiting. Patient not taking: Reported on 06/17/2020 06/28/19   Lesleigh Noe, MD    Physical Exam Vitals: Blood pressure 124/78, height $RemoveBefore'5\' 1"'[id number]$  (1.549 m), weight 176 lb (79.8 kg), last menstrual period 05/21/2020. Body mass index is 33.25 kg/m.  General: NAD, well nourished appears stated age 53: normocephalic, anicteric Pulmonary: No increased work of breathing Neurologic: Grossly intact Psychiatric: mood appropriate, affect full  * No order type specified *  Immunization History  Administered Date(s) Administered  . DTaP 10/01/1999, 11/21/1999, 01/23/2000, 10/28/2000, 11/09/2003  . Hepatitis A 07/13/2007,  02/08/2009  . Hepatitis B 02-12-2000, 10/01/1999, 01/23/2000  . HiB (PRP-OMP) 10/01/1999, 12/01/1999, 01/23/2000, 07/15/2000  . Hpv 05/19/2012, 07/27/2012, 12/23/2012  . IPV 10/01/1999, 12/01/1999, 01/23/2000, 11/09/2003  . Influenza,inj,Quad PF,6+ Mos 02/10/2018, 01/20/2019  . Influenza-Unspecified 03/01/2017  . MMR 10/01/1999, 11/21/1999  . Meningococcal Conjugate 05/19/2012  . Moderna Sars-Covid-2 Vaccination 09/15/2019, 10/06/2019  . Tdap 09/04/2010  . Varicella 07/15/2000, 07/13/2007     Assessment: 20 y.o. G0P0000 presenting for initial infertility evaluatoin  Problem List Items Addressed This Visit   None   Visit Diagnoses    Irregular menstrual cycle    -  Primary   Relevant Orders   17-Hydroxyprogesterone   Androstenedione   DHEA-sulfate   FSH/LH   TSH   Testosterone,Free and Total   Prolactin   US Transvaginal Non-OB   Cold intolerance       Relevant Orders   TSH   Encounter for preconception consultation       Relevant Orders   17-Hydroxyprogesterone   Androstenedione   DHEA-sulfate   FSH/LH   TSH   Testosterone,Free and Total   Prolactin   Von Willebrand's disease ([place])       Class  1 obesity without serious comorbidity with body mass index (BMI) of 33.0 to 33.9 in adult, unspecified obesity type       Relevant Orders   17-Hydroxyprogesterone   Androstenedione   DHEA-sulfate   FSH/LH   TSH   Testosterone,Free and Total   Prolactin       Plan: 1) We discussed the underlying etiologies which may be implicated in a couple experiencing difficulty conceiving.  The average couple will conceive within the span of 1 year with unprotected coitus, with a monthly fecundity rate of 20% or 1 in 5.  Even without further work up or intervention the patient and her partner may be successful in conceiving unassisted, although if an underlying etiology can be identified and addressed fecundity rate may improve.  The work up entails examining for ovulatory function,  tubal patency, and ruling out female factor infertility.  These may be looked at concurrently or sequentially.  The downside of sequential work up is that this method may miss issues if more than one compartment is contributing.  She is aware that tubal factor or moderate to severe female factor infertility will require further consultation with a reproductive endocrinologist.  In the case of anovulation, use of Clomid (clomiphen citrate) or Femara (letrazole) were discussed with the understanding the the later is an off-label, but well supported use.  With either of these drugs the risk of multiples increases from the standard population rate of 2% to approximately 10%, with higher order multiples possible but unlikely.  Both drugs may require some time to titrate to the appropriate dosage to ensure consistent ovulation.  Cycles will be limited to 6 cycles on each drug secondary to decreasing rates of conception after 6 cycles.  In addition should patient be started on ovulation induction with Clomid she was advised to discontinue the drug for any vision changes as this is a rare but potentially permanent side-effect if medication is continued.  We discussed timing of intercourse as well as the use of ovulation predictor kits identify the patient's fertile window each month.    - Basic laboratory work up started given preceeding history of abnormal menses - Ultrasound to evalute for PCOS - Von willebrand has not needed DDAVP for dental procedures etc but does report bleeds easily with cuts  2) Preconception counseling - immunization up to date.  Has had 2 COVID shots.  The patient denies any family history of conditions which would warrant preconception genetic counseling or testing on her or her partner.  Instructed to start prenatal vitamins while trying to conceive.    3) A total of 25 minutes were spent in face-to-face contact with the patient during this encounter with over half of that time devoted to  counseling and coordination of care.  4) Return in about 1 week (around 06/24/2020) for GYN and TVUS follow up.    Malachy Mood, MD, [facility], [place] 06/17/2020, 11:36 AM

## 2020-06-25 ENCOUNTER — Encounter: Payer: Self-pay | Admitting: Obstetrics and Gynecology

## 2020-06-25 ENCOUNTER — Ambulatory Visit (INDEPENDENT_AMBULATORY_CARE_PROVIDER_SITE_OTHER): Payer: BC Managed Care – PPO | Admitting: Obstetrics and Gynecology

## 2020-06-25 ENCOUNTER — Other Ambulatory Visit: Payer: Self-pay

## 2020-06-25 ENCOUNTER — Ambulatory Visit (INDEPENDENT_AMBULATORY_CARE_PROVIDER_SITE_OTHER): Payer: BC Managed Care – PPO

## 2020-06-25 VITALS — BP 126/74 | Ht 61.0 in | Wt 174.0 lb

## 2020-06-25 DIAGNOSIS — N979 Female infertility, unspecified: Secondary | ICD-10-CM | POA: Diagnosis not present

## 2020-06-25 DIAGNOSIS — N926 Irregular menstruation, unspecified: Secondary | ICD-10-CM

## 2020-06-25 DIAGNOSIS — Z3169 Encounter for other general counseling and advice on procreation: Secondary | ICD-10-CM

## 2020-06-25 NOTE — Progress Notes (Signed)
Gynecology Ultrasound Follow Up  Chief Complaint:  Chief Complaint  Patient presents with  . Follow-up    F/U TVUS - RM 5     History of Present Illness: Patient is a 20 y.o. female who presents today for ultrasound evaluation of infertility .  Ultrasound demonstrates the following findgins Adnexa: no masses seen Uterus: Homogenous myometrial echo texture no evidence of leiomyomata with endometrial stripe trilaminar without focal abnormalities Additional: no free fluid  Review of Systems: Review of Systems  Constitutional: Negative.   Gastrointestinal: Negative.   Genitourinary: Negative.     Past Medical History:  Past Medical History:  Diagnosis Date  . Anxiety   . Epiploic appendagitis   . MTHFR mutation   . Von Willebrand disease ([facility]) Dr. Janee Morn at [facility]    Past Surgical History:  Past Surgical History:  Procedure Laterality Date  . ESOPHAGOGASTRODUODENOSCOPY (EGD) WITH PROPOFOL N/A 01/30/2020   Procedure: ESOPHAGOGASTRODUODENOSCOPY (EGD) WITH PROPOFOL;  Surgeon: Wyline Mood, MD;  Location[facility] ENDOSCOPY;  Service: Gastroenterology;  Laterality: N/A;  . WISDOM TOOTH EXTRACTION  08/2018   no bleeding complications    Gynecologic History:  No LMP recorded. Contraception: none Last Pap: N/A >21 Family History:  Family History  Problem Relation Age of Onset  . Arthritis Maternal Grandmother   . Hyperlipidemia Maternal Grandmother   . CAD Maternal Grandfather   . Cancer Paternal Grandmother        melanoma, sinus cancer  . Colon polyps Paternal Grandmother   . Esophageal cancer Neg Hx   . Stomach cancer Neg Hx   . Liver disease Neg Hx     Social History:  Social History   Socioeconomic History  . Marital status: Single    Spouse name: Not on file  . Number of children: Not on file  . Years of education: Not on file  . Highest education level: Not on file  Occupational History  . Not on file  Tobacco Use  . Smoking status: Never Smoker  .  Smokeless tobacco: Never Used  Vaping Use  . Vaping Use: Never used  Substance and Sexual Activity  . Alcohol use: No    Alcohol/week: 0.0 standard drinks  . Drug use: No  . Sexual activity: Yes    Birth control/protection: None  Other Topics Concern  . Not on file  Social History Narrative  . Not on file   Social Determinants of Health   Financial Resource Strain: Not on file  Food Insecurity: Not on file  Transportation Needs: Not on file  Physical Activity: Not on file  Stress: Not on file  Social Connections: Not on file  Intimate Partner Violence: Not on file    Allergies:  Allergies  Allergen Reactions  . Tylenol [Acetaminophen] Other (See Comments)    fever  . Depo-Medrol [Methylprednisolone] Nausea Only    Feeling flushed    Medications: Prior to Admission medications   Medication Sig Start Date End Date Taking? Authorizing Provider  ibuprofen (ADVIL) 200 MG tablet Take by mouth.   Yes [provider]  hydrOXYzine (ATARAX/VISTARIL) 25 MG tablet Take 1-2 tablets (25-50 mg total) by mouth 3 (three) times daily as needed for anxiety. Patient not taking: Reported on 01/01/2020 12/05/19   Lynnda Child, MD  ibuprofen (ADVIL,MOTRIN) 200 MG tablet Take 600 mg by mouth every 6 (six) hours as needed. Patient not taking: Reported on 01/30/2020    [provider]  norethindrone-ethinyl estradiol (JUNEL FE 1/20) 1-20 MG-MCG tablet  Take 1 tablet by mouth daily. Patient not taking: Reported on 06/17/2020 05/09/20   Conard Novak, MD  omeprazole (PRILOSEC) 40 MG capsule Take 1 capsule (40 mg total) by mouth daily. Patient not taking: Reported on 01/30/2020 01/01/20   Wyline Mood, MD  ondansetron (ZOFRAN ODT) 4 MG disintegrating tablet Take 1 tablet (4 mg total) by mouth every 8 (eight) hours as needed for nausea or vomiting. Patient not taking: Reported on 06/17/2020 06/28/19   Lynnda Child, MD    Physical Exam Vitals: Blood pressure 126/74, height 5\' 1"   (1.549 m), weight 174 lb (78.9 kg).  General: NAD HEENT: normocephalic, anicteric Pulmonary: No increased work of breathing Extremities: no edema, erythema, or tenderness Neurologic: Grossly intact, normal gait Psychiatric: mood appropriate, affect full   Assessment: 20 y.o. G0P0000 preconception infertility follow up ultrasound  Plan: Problem List Items Addressed This Visit   None   Visit Diagnoses    Encounter for preconception consultation    -  Primary   Female infertility          1) Ultrasound today showing structurally normal uterus and ovaries.  No concerns.  Labs obtained at last visit pending.  Recommend 6 months of monitored cycle with OPK and if no conception consider proceeding with semen analysis  2) A total of 15 minutes were spent in face-to-face contact with the patient during this encounter with over half of that time devoted to counseling and coordination of care.  3) Return in about 1 year (around 06/25/2021) for annual.    06/27/2021, MD, [facility], [facility] 06/25/2020, 11:50 AM

## 2020-06-28 LAB — DHEA-SULFATE: DHEA-SO4: 141 ug/dL (ref 110.0–431.7)

## 2020-06-28 LAB — TSH: TSH: 1.52 u[IU]/mL (ref 0.450–4.500)

## 2020-06-28 LAB — FSH/LH
FSH: 6.1 m[IU]/mL
LH: 9.6 m[IU]/mL

## 2020-06-28 LAB — ANDROSTENEDIONE: Androstenedione: 102 ng/dL (ref 41–262)

## 2020-06-28 LAB — PROLACTIN: Prolactin: 8.8 ng/mL (ref 4.8–23.3)

## 2020-06-28 LAB — 17-HYDROXYPROGESTERONE: 17-Hydroxyprogesterone: 34 ng/dL

## 2020-06-28 LAB — TESTOSTERONE,FREE AND TOTAL
Testosterone, Free: 1.1 pg/mL (ref 0.0–4.2)
Testosterone: 20 ng/dL (ref 13–71)

## 2020-07-05 ENCOUNTER — Emergency Department (HOSPITAL_COMMUNITY)
Admission: EM | Admit: 2020-07-05 | Discharge: 2020-07-06 | Disposition: A | Discharge status: Home / Self Care | Payer: BC Managed Care – PPO | Attending: Emergency Medicine | Admitting: Emergency Medicine

## 2020-07-05 ENCOUNTER — Other Ambulatory Visit: Payer: Self-pay

## 2020-07-05 ENCOUNTER — Encounter (HOSPITAL_COMMUNITY): Payer: Self-pay | Admitting: Emergency Medicine

## 2020-07-05 DIAGNOSIS — Z5321 Procedure and treatment not carried out due to patient leaving prior to being seen by health care provider: Secondary | ICD-10-CM | POA: Insufficient documentation

## 2020-07-05 DIAGNOSIS — R109 Unspecified abdominal pain: Secondary | ICD-10-CM | POA: Insufficient documentation

## 2020-07-05 DIAGNOSIS — R112 Nausea with vomiting, unspecified: Secondary | ICD-10-CM | POA: Diagnosis not present

## 2020-07-05 LAB — CBC
HCT: 42.9 % (ref 36.0–46.0)
Hemoglobin: 14.8 g/dL (ref 12.0–15.0)
MCH: 28.3 pg (ref 26.0–34.0)
MCHC: 34.5 g/dL (ref 30.0–36.0)
MCV: 82 fL (ref 80.0–100.0)
Platelets: 425 10*3/uL — ABNORMAL HIGH (ref 150–400)
RBC: 5.23 MIL/uL — ABNORMAL HIGH (ref 3.87–5.11)
RDW: 13.2 % (ref 11.5–15.5)
WBC: 17.4 10*3/uL — ABNORMAL HIGH (ref 4.0–10.5)
nRBC: 0 % (ref 0.0–0.2)

## 2020-07-05 LAB — COMPREHENSIVE METABOLIC PANEL
ALT: 23 U/L (ref 0–44)
AST: 25 U/L (ref 15–41)
Albumin: 3.7 g/dL (ref 3.5–5.0)
Alkaline Phosphatase: 74 U/L (ref 38–126)
Anion gap: 13 (ref 5–15)
BUN: 6 mg/dL (ref 6–20)
CO2: 22 mmol/L (ref 22–32)
Calcium: 9.2 mg/dL (ref 8.9–10.3)
Chloride: 101 mmol/L (ref 98–111)
Creatinine, Ser: 0.75 mg/dL (ref 0.44–1.00)
GFR, Estimated: >60 mL/min (ref >60)
Glucose, Bld: 119 mg/dL — ABNORMAL HIGH (ref 70–99)
Potassium: 3.4 mmol/L — ABNORMAL LOW (ref 3.5–5.1)
Sodium: 136 mmol/L (ref 135–145)
Total Bilirubin: 2 mg/dL — ABNORMAL HIGH (ref 0.3–1.2)
Total Protein: 7 g/dL (ref 6.5–8.1)

## 2020-07-05 LAB — URINALYSIS, ROUTINE W REFLEX MICROSCOPIC
Bilirubin Urine: NEGATIVE
Glucose, UA: NEGATIVE mg/dL
Ketones, ur: NEGATIVE mg/dL
Nitrite: NEGATIVE
Protein, ur: 30 mg/dL — AB
Specific Gravity, Urine: 1.014 (ref 1.005–1.030)
pH: 5 (ref 5.0–8.0)

## 2020-07-05 LAB — I-STAT BETA HCG BLOOD, ED (MC, WL, AP ONLY): I-stat hCG, quantitative: <5 m[IU]/mL (ref <5)

## 2020-07-05 LAB — LIPASE, BLOOD: Lipase: 25 U/L (ref 11–51)

## 2020-07-05 NOTE — ED Triage Notes (Signed)
Patient here with abdominal pain, patient had Nexplanon taken out 12/30.  She has not had a period since.  She passed clots last month, thinks she may have had a miscarriage.  Patient now with nausea and vomiting.  Patient does have Von Willebrand's.

## 2020-07-06 NOTE — ED Notes (Signed)
Patient LWBS

## 2020-07-09 ENCOUNTER — Ambulatory Visit (INDEPENDENT_AMBULATORY_CARE_PROVIDER_SITE_OTHER): Payer: BC Managed Care – PPO | Admitting: Gastroenterology

## 2020-07-09 ENCOUNTER — Other Ambulatory Visit: Payer: Self-pay

## 2020-07-09 VITALS — BP 136/92 | HR 83 | Temp 98.2°F | Ht 61.0 in | Wt 175.0 lb

## 2020-07-09 DIAGNOSIS — R109 Unspecified abdominal pain: Secondary | ICD-10-CM | POA: Diagnosis not present

## 2020-07-09 DIAGNOSIS — R319 Hematuria, unspecified: Secondary | ICD-10-CM

## 2020-07-09 MED ORDER — OMEPRAZOLE 40 MG PO CPDR: 40.0000 mg | DELAYED_RELEASE_CAPSULE | Freq: Every day | ORAL | 0 refills | Status: DC

## 2020-07-09 NOTE — Progress Notes (Signed)
Wyline Mood MD, MRCP(U.K) [address]  [address]  [place], [place] [address]  Main: [phone]  Fax: [phone]   Primary Care Physician: Lorre Munroe, NP  Primary Gastroenterologist:  Dr. Wyline Mood   ER visit follow-up  HPI: Tamara Mcclain is a 20 y.o. female    Summary of history :  She was initially referred and seen in 12/29/2019 for nausea vomiting along with epiploic appendagitis.Her symptoms at least began in June 2021.CT scan of the abdomen in December 2019 was normal.  CT scan in October 2019 which was performed showed features suggestive of epiploic appendagitis. She was seen in the emergency room in June 2021 at [facility] for abdominal pain of 2 days duration. Nonbloody emesis. Felt to be gastroenteritis. Hemoglobin is 14.5 g with an MCV of 81.7 platelet count of 369 with protein and leukocyte esterase present in the urine. Large quantity of blood. LFTs were normal.    At her initial visitShe describes the abdominal pain  in both her flanks. On and off. Most days right after she eats within 30 minutes and last for about 30 minutes. No clear other aggravating or relieving factors. Had  a bowel movement every other day. Not relieved by bowel movement. Took  ibuprofen 2 tablets a day.Denied any marijuana use. Nausea at times but no vomiting. Father had issues with  gallbladder. She has not lost weight but rather gained weight recently. Pain is sharp in nature. Nonradiating.  01/11/2020: HIDA scan: Normal ejection fraction of the gallbladder. 01/02/2020: H. pylori breath test: Negative: 01/30/2020: EGD: Normal appearance of the GI tract: Biopsies of the stomach showed focal chronic inflammation.  02/05/2020 was seen at the emergency room at [facility] with abdominal pain underwent a CT scan of the abdomen and pelvis which demonstrated no abnormalities   Interval history 03/13/2020-07/09/2020  04/03/2020: Right lower quadrant pain seen at  [facility] emergency department.  Urine analysis did have small leukocyte esterase and large blood.Had a CT scan of the abdomen on 04/04/2020 that showed no acute abnormalities.  She has been seen by OB/GYN for evaluation of infertility.ER visit on 07/05/2020 was seen for abdominal pain concern for miscarriage.   07/05/2020: CBC showed hemoglobin of 14.8 with a white cell count of 17.4, CMP showed a bilirubin of 2.0 total.  Urinalysis showed large quantity of blood trace leukocyte and rare bacteria i-STAT hCG was less than 5.0.  Lipase was normal.  Her main complaint today is epigastric pain ongoing for the past few weeks.  Not affected by food intake.  No clear relieving factors.  Radiating to the lower part of the abdomen and to the sides.  Denies any dysuria.  Denies any NSAID use.  Not on a PPI.  Has not seen a neurologist.  Not taking any antibiotics recently.    Current Outpatient Medications  Medication Sig Dispense Refill  . hydrOXYzine (ATARAX/VISTARIL) 25 MG tablet Take 1-2 tablets (25-50 mg total) by mouth 3 (three) times daily as needed for anxiety. (Patient not taking: Reported on 01/01/2020) 30 tablet 0  . ibuprofen (ADVIL) 200 MG tablet Take by mouth.    Marland Kitchen ibuprofen (ADVIL,MOTRIN) 200 MG tablet Take 600 mg by mouth every 6 (six) hours as needed. (Patient not taking: Reported on 01/30/2020)    . norethindrone-ethinyl estradiol (JUNEL FE 1/20) 1-20 MG-MCG tablet Take 1 tablet by mouth daily. (Patient not taking: Reported on 06/17/2020) 84 tablet 4  . omeprazole (PRILOSEC) 40 MG capsule Take 1 capsule (40  mg total) by mouth daily. 90 capsule 0  . ondansetron (ZOFRAN ODT) 4 MG disintegrating tablet Take 1 tablet (4 mg total) by mouth every 8 (eight) hours as needed for nausea or vomiting. (Patient not taking: Reported on 06/17/2020) 30 tablet 0   No current facility-administered medications for this visit.    Allergies as of 07/09/2020 - Review Complete 07/09/2020  Allergen Reaction Noted   . Tylenol [acetaminophen] Other (See Comments) 08/18/2016  . Depo-medrol [methylprednisolone] Nausea Only 10/24/2019    ROS:  General: Negative for anorexia, weight loss, fever, chills, fatigue, weakness. ENT: Negative for hoarseness, difficulty swallowing , nasal congestion. CV: Negative for chest pain, angina, palpitations, dyspnea on exertion, peripheral edema.  Respiratory: Negative for dyspnea at rest, dyspnea on exertion, cough, sputum, wheezing.  GI: See history of present illness. GU:  Negative for dysuria, hematuria, urinary incontinence, urinary frequency, nocturnal urination.  Endo: Negative for unusual weight change.    Physical Examination:   BP (!) 136/92   Pulse 83   Temp 98.2 F (36.8 C)   Ht 5\' 1"  (1.549 m)   Wt 175 lb (79.4 kg)   BMI 33.07 kg/m   General: Well-nourished, well-developed in no acute distress.  Eyes: No icterus. Conjunctivae pink. Mouth: Oropharyngeal mucosa moist and pink , no lesions erythema or exudate. Lungs: Clear to auscultation bilaterally. Non-labored. Abdomen: Bowel sounds are normal, mild epigastric tenderness, nondistended, no hepatosplenomegaly or masses, no abdominal bruits or hernia , no rebound or guarding.   Neuro: Alert and oriented x 3.  Grossly intact. Skin: Warm and dry, no jaundice.   Psych: Alert and cooperative, normal mood and affect.   Imaging Studies: Transvaginal Non-OB  Result Date: 06/25/2020 Patient Name: Tamara Mcclain DOB: [birth year] MRN: 2000 ULTRASOUND REPORT Location: [facility] Date of Service: 06/25/2020 Indications:Infertility Findings: The uterus is Axial and measures 6.8 x 3.7 x 2.9 cm. Echo texture is homogenous without evidence of focal masses. The Endometrium measures 5.6 mm. Right Ovary measures 3.2 x 2.4 x 1.8 cm. It is normal in appearance. Left Ovary measures 2.9 x 3.0 x 2.7 cm. It is normal in appearance. Survey of the adnexa demonstrates no adnexal masses. There is no free fluid in the  cul de sac. Impression: 1. Normal pelvic ultrasound. Recommendations: 1.Clinical correlation with the patient's History and Physical Exam. 06/27/2020, RT Images reviewed.  Normal GYN study without visualized pathology.  Multiple follicles are visualized on both ovaries.  Deanna Artis, MD, [facility], [facility] 06/25/2020, 11:34 AM    Assessment and Plan:   Tamara Mcclain is a 20 y.o. y/o female here to follow-up to recent ER visit when she presented with abdominal pain.  She has had multiple urine samples which shows blood in the urine and possible UTI.  Recent sample also showed blood in the urine no usage of antibiotics recently.  Today she wants to see me for epigastric discomfort.  Denies any NSAID use.  26 of her pain is not specific.  Since this is a recurring issue and gallbladder is not evaluated I will do the same.  Plan 1. Recurrent urine sample shows blood in the urine.  Suggest referral to urology.  Each time the urine is checked she has had issues with lower back pain.  Questionable recurrent UTI  2. HIDA scan to rule out gallbladder dyskinesia  3. Colonoscopy to evaluate abdominal pain , commence on Prilosec 40 mg a day .   I have  discussed alternative options, risks & benefits,  which include, but are not limited to, bleeding, infection, perforation,respiratory complication & drug reaction.  The patient agrees with this plan & written consent will be obtained.      Dr Wyline Mood  MD,MRCP [facility]) Follow up in 4 weeks video visit

## 2020-07-17 ENCOUNTER — Inpatient Hospital Stay: Payer: BC Managed Care – PPO | Admitting: Internal Medicine

## 2020-07-17 ENCOUNTER — Other Ambulatory Visit: Payer: BC Managed Care – PPO | Attending: Gastroenterology

## 2020-07-19 ENCOUNTER — Ambulatory Visit: Admit: 2020-07-19 | Payer: BC Managed Care – PPO | Admitting: Gastroenterology

## 2020-07-19 SURGERY — COLONOSCOPY WITH PROPOFOL
Anesthesia: General

## 2020-07-26 NOTE — Progress Notes (Addendum)
07/29/2020 8:52 AM   Tamara Mcclain 2001/04/12 [id number]  Referring provider: Wyline Mood, MD [address] [place],  [place] [address] Chief Complaint  Patient presents with  . Hematuria    HPI: Tamara Mcclain is a 20 y.o. female who presents today for evaluation and management of hematuria.   - She was seen in the ER on 07/05/2020 for abdominal pain and concern for miscarriage. - The patient was seen by Dr. Tobi Bastos on 07/09/2020 following her ER visit. - She reported ongoing epigastric pain for the past few weeks.  - Pain radiated to lower abdomen and sides.  - Denied any dysuria. No antibiotics or NSAID use.  - Denies recurrent UTI - Denies dysuria and hematuria.   UA: 07/05/2020: Large blood, RBC 11-20, 21-50 squamous epithelial, rare bacteria   PMH: Past Medical History:  Diagnosis Date  . Anxiety   . Epiploic appendagitis   . MTHFR mutation   . Von Willebrand disease (HCC) Dr. Janee Morn at [facility]    Surgical History: Past Surgical History:  Procedure Laterality Date  . ESOPHAGOGASTRODUODENOSCOPY (EGD) WITH PROPOFOL N/A 01/30/2020   Procedure: ESOPHAGOGASTRODUODENOSCOPY (EGD) WITH PROPOFOL;  Surgeon: Wyline Mood, MD;  Location: [facility] ENDOSCOPY;  Service: Gastroenterology;  Laterality: N/A;  . WISDOM TOOTH EXTRACTION  08/2018   no bleeding complications    Home Medications:  Allergies as of 07/29/2020      Reactions   Tylenol [acetaminophen] Other (See Comments)   fever   Depo-medrol [methylprednisolone] Nausea Only   Feeling flushed      Medication List       Accurate as of July 29, 2020  8:52 AM. If you have any questions, ask your nurse or doctor.        hydrOXYzine 25 MG tablet Commonly known as: ATARAX/VISTARIL Take 1-2 tablets (25-50 mg total) by mouth 3 (three) times daily as needed for anxiety.   ibuprofen 200 MG tablet Commonly known as: ADVIL Take 600 mg by mouth every 6 (six) hours as needed.   ibuprofen 200 MG  tablet Commonly known as: ADVIL Take by mouth.   norethindrone-ethinyl estradiol 1-20 MG-MCG tablet Commonly known as: Junel FE 1/20 Take 1 tablet by mouth daily.   omeprazole 40 MG capsule Commonly known as: PRILOSEC Take 1 capsule (40 mg total) by mouth daily.   ondansetron 4 MG disintegrating tablet Commonly known as: Zofran ODT Take 1 tablet (4 mg total) by mouth every 8 (eight) hours as needed for nausea or vomiting.       Allergies:  Allergies  Allergen Reactions  . Tylenol [Acetaminophen] Other (See Comments)    fever  . Depo-Medrol [Methylprednisolone] Nausea Only    Feeling flushed    Family History: Family History  Problem Relation Age of Onset  . Arthritis Maternal Grandmother   . Hyperlipidemia Maternal Grandmother   . CAD Maternal Grandfather   . Cancer Paternal Grandmother        melanoma, sinus cancer  . Colon polyps Paternal Grandmother   . Esophageal cancer Neg Hx   . Stomach cancer Neg Hx   . Liver disease Neg Hx     Social History:  reports that she has never smoked. She has never used smokeless tobacco. She reports that she does not drink alcohol and does not use drugs.   Physical Exam: BP (!) 135/91   Pulse 77   Ht 5\' 1"  (1.549 m)   Wt 175 lb (79.4 kg)   BMI 33.07 kg/m  Constitutional:  Alert and oriented, No acute distress. HEENT: [place] AT, moist mucus membranes.  Trachea midline, no masses. Cardiovascular: No clubbing, cyanosis, or edema. Respiratory: Normal respiratory effort, no increased work of breathing. Skin: No rashes, bruises or suspicious lesions. Neurologic: Grossly intact, no focal deficits, moving all 4 extremities. Psychiatric: Normal mood and affect.  Laboratory Data:  Urinalysis Pending   Assessment & Plan:    1.  Microhematuria -Prior urinalyses in Epic with significant vaginal epithelial contamination and would not be considered reliable specimen -Urinalysis today is pending and will contact patient with  results and further recommendations  1.  Pyuria - Asymptomatic  -As above   Addendum: Urinalysis returned >30 RBCs and no significant epis.  The patient was contacted and stated she was menstruating at the time of this urine collection.  A follow-up urinalysis with microscopy was recommended in ~ 2 weeks  I, Theador Hawthorne, am acting as a scribe for Dr. Lorin Picket C. Berthel Bagnall,  I have reviewed the above documentation for accuracy and completeness, and I agree with the above.    Riki Altes, MD    [facility] [address] [place], [place] [address] [phone]

## 2020-07-29 ENCOUNTER — Ambulatory Visit (INDEPENDENT_AMBULATORY_CARE_PROVIDER_SITE_OTHER): Payer: BC Managed Care – PPO | Admitting: Urology

## 2020-07-29 ENCOUNTER — Ambulatory Visit: Payer: BC Managed Care – PPO

## 2020-07-29 ENCOUNTER — Other Ambulatory Visit: Payer: Self-pay

## 2020-07-29 ENCOUNTER — Encounter: Payer: Self-pay | Admitting: Urology

## 2020-07-29 VITALS — BP 135/91 | HR 77 | Ht 61.0 in | Wt 175.0 lb

## 2020-07-29 DIAGNOSIS — R310 Gross hematuria: Secondary | ICD-10-CM

## 2020-07-29 DIAGNOSIS — R319 Hematuria, unspecified: Secondary | ICD-10-CM | POA: Diagnosis not present

## 2020-07-30 ENCOUNTER — Telehealth: Payer: Self-pay | Admitting: Urology

## 2020-07-30 LAB — URINALYSIS, COMPLETE
Bilirubin, UA: NEGATIVE
Glucose, UA: NEGATIVE
Ketones, UA: NEGATIVE
Nitrite, UA: NEGATIVE
Protein,UA: NEGATIVE
Specific Gravity, UA: >1.03 — ABNORMAL HIGH (ref 1.005–1.030)
Urobilinogen, Ur: 0.2 mg/dL (ref 0.2–1.0)
pH, UA: 6 (ref 5.0–7.5)

## 2020-07-30 LAB — MICROSCOPIC EXAMINATION: RBC, Urine: >30 /hpf — AB (ref 0–2)

## 2020-07-30 NOTE — Telephone Encounter (Signed)
UA yesterday showed >30 RBCs and no vaginal epithelial cells.  Please find out if patient was on period.

## 2020-07-31 ENCOUNTER — Encounter: Payer: Self-pay | Admitting: Urology

## 2020-07-31 NOTE — Telephone Encounter (Signed)
Pt WAS on her period when she left U/A.

## 2020-07-31 NOTE — Telephone Encounter (Signed)
Recommend a lab visit ~ 2 weeks for repeat urinalysis

## 2020-08-02 NOTE — Telephone Encounter (Signed)
Left message to call back to schedule ° ° °Tamara Mcclain °

## 2020-08-06 ENCOUNTER — Ambulatory Visit (INDEPENDENT_AMBULATORY_CARE_PROVIDER_SITE_OTHER): Payer: BC Managed Care – PPO | Admitting: Podiatry

## 2020-08-06 ENCOUNTER — Encounter: Payer: Self-pay | Admitting: Gastroenterology

## 2020-08-06 ENCOUNTER — Telehealth: Payer: BC Managed Care – PPO | Admitting: Gastroenterology

## 2020-08-06 DIAGNOSIS — Z5329 Procedure and treatment not carried out because of patient's decision for other reasons: Secondary | ICD-10-CM

## 2020-08-06 NOTE — Progress Notes (Deleted)
Wyline Mood , MD [address]  [address]  [place], [place] [address]  Main: [phone]  Fax: [phone]   Primary Care Physician: Lorre Munroe, NP  Virtual Visit via Video Note  I connected with patient on 08/06/20 at  1:15 PM EDT by video and verified that I am speaking with the correct person using two identifiers.   I discussed the limitations, risks, security and privacy concerns of performing an evaluation and management service by video  and the availability of in person appointments. I also discussed with the patient that there may be a patient responsible charge related to this service. The patient expressed understanding and agreed to proceed.  Location of Patient: Home Location of Provider: Home Persons involved: Patient and provider only   History of Present Illness: No chief complaint on file.   HPI: Tamara Mcclain is a 20 y.o. female   Summary of history :  She was initially referred and seen in 12/29/2019 for nausea vomiting along with epiploic appendagitis.Her symptoms at least began in June 2021.CT scan of the abdomen in December 2019 was normal. CT scan in October 2019 which was performed showed features suggestive of epiploic appendagitis. She was seen in the emergency room in June 2021 at [facility] for abdominal pain of 2 days duration. Nonbloody emesis. Felt to be gastroenteritis. Hemoglobin is 14.5 g with an MCV of 81.7 platelet count of 369 with protein and leukocyte esterase present in the urine. Large quantity of blood. LFTs were normal.    At her initial visitShe describesthe abdominal painin both her flanks. On and off. Most days right after she eats within 30 minutes and last for about 30 minutes. No clear other aggravating or relieving factors. Had  a bowel movement every other day. Not relieved by bowel movement. Took  ibuprofen 2 tablets a day.Denied any marijuana use. Nausea at times but no vomiting. Father had issues  with  gallbladder. She has not lost weight but rather gained weight recently. Pain is sharp in nature. Nonradiating.  01/11/2020: HIDA scan: Normal ejection fraction of the gallbladder. 01/02/2020: H. pylori breath test: Negative: 01/30/2020: EGD: Normal appearance of the GI tract: Biopsies of the stomach showed focal chronic inflammation. 02/05/2020 was seen at the emergency room at [facility] with abdominal pain underwent a CT scan of the abdomen and pelvis which demonstrated no abnormalities 04/03/2020: Right lower quadrant pain seen at [facility] emergency department.  Urine analysis did have small leukocyte esterase and large blood. Had a CT scan of the abdomen on 04/04/2020 that showed no acute abnormalities.  Interval history3/05/2020-08/06/2020  Last visit her main complaint is epigastric pain ongoing for a few weeks not affected by food intake and no clear relieving factors radiated to the lower right part of the abdomen and her sides.  Was not on a PPI and has not used any NSAIDs.  She was scheduled to undergo a colonoscopy on 07/19/2020 but the patient canceled the procedure.  I referred her to for urology for blood seen in the urine.  She saw urology on 07/29/2020 and was suggested to repeat her urinalysis in 2 weeks as she was on her periods.        Current Outpatient Medications  Medication Sig Dispense Refill  . hydrOXYzine (ATARAX/VISTARIL) 25 MG tablet Take 1-2 tablets (25-50 mg total) by mouth 3 (three) times daily as needed for anxiety. 30 tablet 0  . ibuprofen (ADVIL) 200 MG tablet Take by mouth.    Marland Kitchen  ibuprofen (ADVIL,MOTRIN) 200 MG tablet Take 600 mg by mouth every 6 (six) hours as needed.    . norethindrone-ethinyl estradiol (JUNEL FE 1/20) 1-20 MG-MCG tablet Take 1 tablet by mouth daily. 84 tablet 4  . omeprazole (PRILOSEC) 40 MG capsule Take 1 capsule (40 mg total) by mouth daily. 90 capsule 0  . ondansetron (ZOFRAN ODT) 4 MG disintegrating tablet Take 1 tablet  (4 mg total) by mouth every 8 (eight) hours as needed for nausea or vomiting. 30 tablet 0   No current facility-administered medications for this visit.    Allergies as of 08/06/2020 - Review Complete 07/31/2020  Allergen Reaction Noted  . Tylenol [acetaminophen] Other (See Comments) 08/18/2016  . Depo-medrol [methylprednisolone] Nausea Only 10/24/2019    Review of Systems:    All systems reviewed and negative except where noted in HPI.  General Appearance:    Alert, cooperative, no distress, appears stated age  Head:    Normocephalic, without obvious abnormality, atraumatic  Eyes:    PERRL, conjunctiva/corneas clear,  Ears:    Grossly normal hearing    Neurologic:  Grossly normal    Observations/Objective:  Labs: CMP     Component Value Date/Time   NA 136 07/05/2020 2200   K 3.4 (L) 07/05/2020 2200   CL 101 07/05/2020 2200   CO2 22 07/05/2020 2200   GLUCOSE 119 (H) 07/05/2020 2200   BUN 6 07/05/2020 2200   CREATININE 0.75 07/05/2020 2200   CALCIUM 9.2 07/05/2020 2200   PROT 7.0 07/05/2020 2200   ALBUMIN 3.7 07/05/2020 2200   AST 25 07/05/2020 2200   ALT 23 07/05/2020 2200   ALKPHOS 74 07/05/2020 2200   BILITOT 2.0 (H) 07/05/2020 2200   GFRNONAA >60 07/05/2020 2200   GFRAA >60 04/09/2018 2151   Lab Results  Component Value Date   WBC 17.4 (H) 07/05/2020   HGB 14.8 07/05/2020   HCT 42.9 07/05/2020   MCV 82.0 07/05/2020   PLT 425 (H) 07/05/2020    Imaging Studies: No results found.  Assessment and Plan:   Tamara Mcclain is a 20 y.o. y/o femalehere to follow-up to recent ER visit when she presented with abdominal pain.  She has had multiple urine samples which shows blood in the urine and possible UTI.  Recent sample also showed blood in the urine no usage of antibiotics recently.  Today she wants to see me for epigastric discomfort.  Denies any NSAID use.  Ashby Dawes of her pain is not specific.  Since this is a recurring issue and gallbladder is not evaluated I  will do the same.     I discussed the assessment and treatment plan with the patient. The patient was provided an opportunity to ask questions and all were answered. The patient agreed with the plan and demonstrated an understanding of the instructions.   The patient was advised to call back or seek an in-person evaluation if the symptoms worsen or if the condition fails to improve as anticipated.  I provided *** minutes of face-to-face time during this encounter.  Dr Wyline Mood MD,MRCP [facility]) Gastroenterology/Hepatology Pager: [phone]   Speech recognition software was used to dictate this note.

## 2020-08-06 NOTE — Progress Notes (Signed)
   Complete physical exam  Patient: Tamara Mcclain   DOB: 02/28/2000   20 y.o. Female  MRN: [id number]  Subjective:    No chief complaint on file.   Tamara Mcclain is a 20 y.o. female who presents today for a complete physical exam. She reports consuming a {diet types:17450} diet. {types:19826} She generally feels {DESC; WELL/FAIRLY WELL/POORLY:18703}. She reports sleeping {DESC; WELL/FAIRLY WELL/POORLY:18703}. She {does/does not:200015} have additional problems to discuss today.    Most recent fall risk assessment:    11/05/2021   10:42 AM  Fall Risk   Falls in the past year? 0  Number falls in past yr: 0  Injury with Fall? 0  Risk for fall due to : No Fall Risks  Follow up Falls evaluation completed     Most recent depression screenings:    11/05/2021   10:42 AM 09/26/2020   10:46 AM  PHQ 2/9 Scores  PHQ - 2 Score 0 0  PHQ- 9 Score 5     {VISON DENTAL STD PSA (Optional):27386}  {History (Optional):23778}  Patient Care Team: Jessup, Joy, NP as PCP - General (Nurse Practitioner)   Outpatient Medications Prior to Visit  Medication Sig   fluticasone (FLONASE) 50 MCG/ACT nasal spray Place 2 sprays into both nostrils in the morning and at bedtime. After 7 days, reduce to once daily.   norgestimate-ethinyl estradiol (SPRINTEC 28) 0.25-35 MG-MCG tablet Take 1 tablet by mouth daily.   Nystatin POWD Apply liberally to affected area 2 times per day   spironolactone (ALDACTONE) 100 MG tablet Take 1 tablet (100 mg total) by mouth daily.   No facility-administered medications prior to visit.    ROS        Objective:     There were no vitals taken for this visit. {Vitals History (Optional):23777}  Physical Exam   No results found for any visits on 12/11/21. {Show previous labs (optional):23779}    Assessment & Plan:    Routine Health Maintenance and Physical Exam  Immunization History  Administered Date(s) Administered   DTaP 05/14/1999, 07/10/1999,  09/18/1999, 06/03/2000, 12/18/2003   Hepatitis A 10/14/2007, 10/19/2008   Hepatitis B 03/01/1999, 04/08/1999, 09/18/1999   HiB (PRP-OMP) 05/14/1999, 07/10/1999, 09/18/1999, 06/03/2000   IPV 05/14/1999, 07/10/1999, 03/08/2000, 12/18/2003   Influenza,inj,Quad PF,6+ Mos 01/19/2014   Influenza-Unspecified 04/20/2012   MMR 03/08/2001, 12/18/2003   Meningococcal Polysaccharide 10/19/2011   Pneumococcal Conjugate-13 06/03/2000   Pneumococcal-Unspecified 09/18/1999, 12/02/1999   Tdap 10/19/2011   Varicella 03/08/2000, 10/14/2007    Health Maintenance  Topic Date Due   HIV Screening  Never done   Hepatitis C Screening  Never done   INFLUENZA VACCINE  12/09/2021   PAP-Cervical Cytology Screening  12/11/2021 (Originally 02/28/2020)   PAP SMEAR-Modifier  12/11/2021 (Originally 02/28/2020)   TETANUS/TDAP  12/11/2021 (Originally 10/18/2021)   HPV VACCINES  Discontinued   COVID-19 Vaccine  Discontinued    Discussed health benefits of physical activity, and encouraged her to engage in regular exercise appropriate for her age and condition.  Problem List Items Addressed This Visit   None Visit Diagnoses     Annual physical exam    -  Primary   Cervical cancer screening       Need for Tdap vaccination          No follow-ups on file.     Joy Jessup, NP   

## 2020-08-26 ENCOUNTER — Telehealth: Payer: Self-pay

## 2020-08-26 NOTE — Telephone Encounter (Signed)
Pt was tx'd from SP c/o started spotting yesterday; more like a period this morning; has gotten heavier - has gone thru 2 overnight pads in an hour and a half; has sharp pains in her back and really bad cramping in her lower abd; neg home preg test last week; is trying to conceive.  Adv pt she needed to go to ED per our policy.

## 2020-08-26 NOTE — Telephone Encounter (Signed)
Patient is scheduled for 08/28/20 with CRS at 3:10

## 2020-08-28 ENCOUNTER — Ambulatory Visit: Payer: BC Managed Care – PPO | Admitting: Obstetrics and Gynecology

## 2020-09-05 ENCOUNTER — Other Ambulatory Visit (HOSPITAL_COMMUNITY)
Admission: RE | Admit: 2020-09-05 | Discharge: 2020-09-05 | Disposition: A | Discharge status: Home / Self Care | Payer: BC Managed Care – PPO | Source: Ambulatory Visit | Attending: Obstetrics and Gynecology | Admitting: Obstetrics and Gynecology

## 2020-09-05 ENCOUNTER — Ambulatory Visit (INDEPENDENT_AMBULATORY_CARE_PROVIDER_SITE_OTHER): Payer: BC Managed Care – PPO | Admitting: Obstetrics and Gynecology

## 2020-09-05 ENCOUNTER — Other Ambulatory Visit: Payer: Self-pay

## 2020-09-05 ENCOUNTER — Encounter: Payer: Self-pay | Admitting: Obstetrics and Gynecology

## 2020-09-05 VITALS — BP 120/70 | Ht 61.0 in | Wt 184.0 lb

## 2020-09-05 DIAGNOSIS — Z124 Encounter for screening for malignant neoplasm of cervix: Secondary | ICD-10-CM | POA: Diagnosis present

## 2020-09-05 DIAGNOSIS — N979 Female infertility, unspecified: Secondary | ICD-10-CM

## 2020-09-05 DIAGNOSIS — Z01419 Encounter for gynecological examination (general) (routine) without abnormal findings: Secondary | ICD-10-CM | POA: Diagnosis not present

## 2020-09-05 DIAGNOSIS — Z113 Encounter for screening for infections with a predominantly sexual mode of transmission: Secondary | ICD-10-CM | POA: Insufficient documentation

## 2020-09-05 DIAGNOSIS — Z3490 Encounter for supervision of normal pregnancy, unspecified, unspecified trimester: Secondary | ICD-10-CM

## 2020-09-05 DIAGNOSIS — Z3169 Encounter for other general counseling and advice on procreation: Secondary | ICD-10-CM | POA: Diagnosis not present

## 2020-09-05 NOTE — Progress Notes (Signed)
Patient ID: Tamara Mcclain, female   DOB: 02/09/2001, 20 y.o.   MRN: [id number]  Reason for Consult: Follow-up   Referred by Lorre Munroe, NP  Subjective:     HPI:  Tamara Mcclain is a 20 y.o. female.  She is following up today from an ER visit in the middle of February.  She reports that she believes she has had 2 early miscarriages.  She believes that she had a miscarriage in February 2022 and again on August 26, 2020.  She reports that both of these time she had a positive pregnancy test which was positive before a missed period and then her period started at the usual time.  She reports that she had a positive pregnancy test on April 15.  She took this pregnancy test because she was having right-sided abdominal pain.  Then on Easter which was April 17 she began having bleeding.  The bleeding was light at first and spotting and then became heavier.  She was seen at the ER on April 18 and her beta hCG at that time was less than 5.  The bleeding that she had on April 18 was heavier than a normal period which is what led her to believe it was a miscarriage.   She has been taking a supplement from [facility] for fertility.  Gynecological History  Patient's last menstrual period was 07/29/2020. Menarche: 12 Menopause: no  She denies passage of large clots She reports sensations of gushing or flooding of blood. She denies accidents where she bleeds through her clothing. She denies that she changes a saturated pad or tampon more frequently than every hour.  She denies that pain from her periods limits her activities.  History of fibroids, polyps, or ovarian cysts? : no  History of PCOS? no Hstory of Endometriosis? no History of abnormal pap smears? no Have you had any sexually transmitted infections in the past? no  She denies HPV vaccination in the past. She declines HPV vaccinations today  Last ACZ:YSAYT  She identifies as a female. She is sexually active with men.   She denies  dyspareunia. She denies postcoital bleeding.  She currently uses none for contraception.   Obstetrical History Reports two recent miscarriages in April 2022 in February 2022/   Past Medical History:  Diagnosis Date  . Anxiety   . Epiploic appendagitis   . MTHFR mutation   . Von Willebrand disease (HCC) Dr. Janee Morn at [facility] History  Problem Relation Age of Onset  . Arthritis Maternal Grandmother   . Hyperlipidemia Maternal Grandmother   . CAD Maternal Grandfather   . Cancer Paternal Grandmother        melanoma, sinus cancer  . Colon polyps Paternal Grandmother   . Esophageal cancer Neg Hx   . Stomach cancer Neg Hx   . Liver disease Neg Hx    Past Surgical History:  Procedure Laterality Date  . ESOPHAGOGASTRODUODENOSCOPY (EGD) WITH PROPOFOL N/A 01/30/2020   Procedure: ESOPHAGOGASTRODUODENOSCOPY (EGD) WITH PROPOFOL;  Surgeon: Wyline Mood, MD;  Location: [facility] ENDOSCOPY;  Service: Gastroenterology;  Laterality: N/A;  . WISDOM TOOTH EXTRACTION  08/2018   no bleeding complications    Short Social History:  Social History   Tobacco Use  . Smoking status: Never Smoker  . Smokeless tobacco: Never Used  Substance Use Topics  . Alcohol use: No    Alcohol/week: 0.0 standard drinks    Allergies  Allergen Reactions  . Tylenol [Acetaminophen] Other (See Comments)  fever  . Depo-Medrol [Methylprednisolone] Nausea Only    Feeling flushed    Current Outpatient Medications  Medication Sig Dispense Refill  . ibuprofen (ADVIL) 200 MG tablet Take by mouth.    . hydrOXYzine (ATARAX/VISTARIL) 25 MG tablet Take 1-2 tablets (25-50 mg total) by mouth 3 (three) times daily as needed for anxiety. (Patient not taking: Reported on 09/05/2020) 30 tablet 0  . ibuprofen (ADVIL,MOTRIN) 200 MG tablet Take 600 mg by mouth every 6 (six) hours as needed. (Patient not taking: Reported on 09/05/2020)    . norethindrone-ethinyl estradiol (JUNEL FE 1/20) 1-20 MG-MCG tablet Take 1 tablet by mouth  daily. (Patient not taking: Reported on 09/05/2020) 84 tablet 4  . omeprazole (PRILOSEC) 40 MG capsule Take 1 capsule (40 mg total) by mouth daily. (Patient not taking: Reported on 09/05/2020) 90 capsule 0  . ondansetron (ZOFRAN ODT) 4 MG disintegrating tablet Take 1 tablet (4 mg total) by mouth every 8 (eight) hours as needed for nausea or vomiting. (Patient not taking: Reported on 09/05/2020) 30 tablet 0   No current facility-administered medications for this visit.    Review of Systems  Constitutional: Negative for chills, fatigue, fever and unexpected weight change.  HENT: Negative for trouble swallowing.  Eyes: Negative for loss of vision.  Respiratory: Negative for cough, shortness of breath and wheezing.  Cardiovascular: Negative for chest pain, leg swelling, palpitations and syncope.  GI: Negative for abdominal pain, blood in stool, diarrhea, nausea and vomiting.  GU: Positive for frequency. Negative for difficulty urinating, dysuria and hematuria.  Musculoskeletal: Positive for joint pain. Negative for back pain and leg pain.  Skin: Negative for rash.  Neurological: Negative for dizziness, headaches, light-headedness, numbness and seizures.  Psychiatric: Negative for behavioral problem, confusion, depressed mood and sleep disturbance.        Objective:  Objective   Vitals:   09/05/20 1115  BP: 120/70  Weight: 184 lb (83.5 kg)  Height: 5\' 1"  (1.549 m)   Body mass index is 34.77 kg/m.  Physical Exam Vitals and nursing note reviewed. Exam conducted with a chaperone present.  Constitutional:      Appearance: Normal appearance. She is well-developed.  HENT:     Head: Normocephalic and atraumatic.  Eyes:     Extraocular Movements: Extraocular movements intact.     Pupils: Pupils are equal, round, and reactive to light.  Cardiovascular:     Rate and Rhythm: Normal rate and regular rhythm.  Pulmonary:     Effort: Pulmonary effort is normal. No respiratory distress.      Breath sounds: Normal breath sounds.  Abdominal:     General: Abdomen is flat.     Palpations: Abdomen is soft.  Genitourinary:    Comments: External: Normal appearing vulva. No lesions noted.  Speculum examination: Normal appearing cervix. No blood in the vaginal vault. no discharge.    Musculoskeletal:        General: No signs of injury.  Skin:    General: Skin is warm and dry.  Neurological:     Mental Status: She is alert and oriented to person, place, and time.  Psychiatric:        Behavior: Behavior normal.        Thought Content: Thought content normal.        Judgment: Judgment normal.     Assessment/Plan:     20 year old who desires pregnancy Possible to recent early miscarriages.  Discussed that she could also be having cross-reactivity of home pregnancy test.  Encourage patient to obtain an early beta hCG after her next positive home pregnancy test.  If this is positive we can consider progesterone supplementation.  At present there is no evidence to support the use of vaginal progesterone in women with a history of unexplained recurrent first trimester miscarriage in an attempt to minimize risk of miscarriage in a subsequent pregnancy.  Its use did not show a clinically significant increase in live birth rate.  However, there were also no identified adverse events to its use.  This data was shared with the patient.    Pap smear and gonorrhea chlamydia testing today as indicated per her age.  Provided with ACOG handout regarding nutrition and pregnancy.  Plan for follow-up as needed.  More than 20 minutes were spent face to face with the patient in the room, reviewing the medical record, labs and images, and coordinating care for the patient. The plan of management was discussed in detail and counseling was provided.    Adelene Idler MD [facility], [facility] 09/05/2020 11:29 AM

## 2020-09-09 LAB — CYTOLOGY - PAP
Chlamydia: NEGATIVE
Comment: NEGATIVE
Comment: NEGATIVE
Comment: NORMAL
Diagnosis: NEGATIVE
Neisseria Gonorrhea: NEGATIVE
Trichomonas: NEGATIVE

## 2020-09-16 ENCOUNTER — Ambulatory Visit: Payer: BC Managed Care – PPO | Admitting: Internal Medicine

## 2020-09-16 NOTE — Progress Notes (Deleted)
Subjective:    Patient ID: Tamara Mcclain, female    DOB: 08/21/2000, 20 y.o.   MRN: [id number]  HPI  Pt presents to the clinic today with c/o shoulder pain.  Review of Systems      Past Medical History:  Diagnosis Date  . Anxiety   . Epiploic appendagitis   . MTHFR mutation   . Von Willebrand disease (HCC) Dr. Janee Morn at [facility]    Current Outpatient Medications  Medication Sig Dispense Refill  . ibuprofen (ADVIL) 200 MG tablet Take by mouth.     No current facility-administered medications for this visit.    Allergies  Allergen Reactions  . Tylenol [Acetaminophen] Other (See Comments)    fever  . Depo-Medrol [Methylprednisolone] Nausea Only    Feeling flushed    Family History  Problem Relation Age of Onset  . Arthritis Maternal Grandmother   . Hyperlipidemia Maternal Grandmother   . CAD Maternal Grandfather   . Cancer Paternal Grandmother        melanoma, sinus cancer  . Colon polyps Paternal Grandmother   . Esophageal cancer Neg Hx   . Stomach cancer Neg Hx   . Liver disease Neg Hx     Social History   Socioeconomic History  . Marital status: Single    Spouse name: Not on file  . Number of children: Not on file  . Years of education: Not on file  . Highest education level: Not on file  Occupational History  . Not on file  Tobacco Use  . Smoking status: Never Smoker  . Smokeless tobacco: Never Used  Vaping Use  . Vaping Use: Never used  Substance and Sexual Activity  . Alcohol use: No    Alcohol/week: 0.0 standard drinks  . Drug use: No  . Sexual activity: Yes    Birth control/protection: None  Other Topics Concern  . Not on file  Social History Narrative  . Not on file   Social Determinants of Health   Financial Resource Strain: Not on file  Food Insecurity: Not on file  Transportation Needs: Not on file  Physical Activity: Not on file  Stress: Not on file  Social Connections: Not on file  Intimate Partner Violence: Not on file      Constitutional: Denies fever, malaise, fatigue, headache or abrupt weight changes.  HEENT: Denies eye pain, eye redness, ear pain, ringing in the ears, wax buildup, runny nose, nasal congestion, bloody nose, or sore throat. Respiratory: Denies difficulty breathing, shortness of breath, cough or sputum production.   Cardiovascular: Denies chest pain, chest tightness, palpitations or swelling in the hands or feet.  Gastrointestinal: Denies abdominal pain, bloating, constipation, diarrhea or blood in the stool.  GU: Denies urgency, frequency, pain with urination, burning sensation, blood in urine, odor or discharge. Musculoskeletal: Pt reports shoulder pain. Denies decrease in range of motion, difficulty with gait, muscle pain or joint swelling.  Skin: Denies redness, rashes, lesions or ulcercations.  Neurological: Denies dizziness, difficulty with memory, difficulty with speech or problems with balance and coordination.  Psych: Denies anxiety, depression, SI/HI.  No other specific complaints in a complete review of systems (except as listed in HPI above).  Objective:   Physical Exam   There were no vitals taken for this visit. Wt Readings from Last 3 Encounters:  09/05/20 184 lb (83.5 kg)  07/29/20 175 lb (79.4 kg)  07/09/20 175 lb (79.4 kg)    General: Appears their stated age, well developed, well nourished in  NAD. Skin: Warm, dry and intact. No rashes, lesions or ulcerations noted. HEENT: Head: normal shape and size; Eyes: sclera white, no icterus, conjunctiva pink, PERRLA and EOMs intact; Ears: Tm's gray and intact, normal light reflex; Nose: mucosa pink and moist, septum midline; Throat/Mouth: Teeth present, mucosa pink and moist, no exudate, lesions or ulcerations noted.  Neck:  Neck supple, trachea midline. No masses, lumps or thyromegaly present.  Cardiovascular: Normal rate and rhythm. S1,S2 noted.  No murmur, rubs or gallops noted. No JVD or BLE edema. No carotid bruits  noted. Pulmonary/Chest: Normal effort and positive vesicular breath sounds. No respiratory distress. No wheezes, rales or ronchi noted.  Abdomen: Soft and nontender. Normal bowel sounds. No distention or masses noted. Liver, spleen and kidneys non palpable. Musculoskeletal: Normal range of motion. No signs of joint swelling. No difficulty with gait.  Neurological: Alert and oriented. Cranial nerves II-XII grossly intact. Coordination normal.  Psychiatric: Mood and affect normal. Behavior is normal. Judgment and thought content normal.   EKG:  BMET    Component Value Date/Time   NA 136 07/05/2020 2200   K 3.4 (L) 07/05/2020 2200   CL 101 07/05/2020 2200   CO2 22 07/05/2020 2200   GLUCOSE 119 (H) 07/05/2020 2200   BUN 6 07/05/2020 2200   CREATININE 0.75 07/05/2020 2200   CALCIUM 9.2 07/05/2020 2200   GFRNONAA >60 07/05/2020 2200   GFRAA >60 04/09/2018 2151    Lipid Panel  No results found for: CHOL, TRIG, HDL, CHOLHDL, VLDL, LDLCALC  CBC    Component Value Date/Time   WBC 17.4 (H) 07/05/2020 2200   RBC 5.23 (H) 07/05/2020 2200   HGB 14.8 07/05/2020 2200   HCT 42.9 07/05/2020 2200   PLT 425 (H) 07/05/2020 2200   MCV 82.0 07/05/2020 2200   MCH 28.3 07/05/2020 2200   MCHC 34.5 07/05/2020 2200   RDW 13.2 07/05/2020 2200   LYMPHSABS 2.6 04/06/2018 1101   MONOABS 0.8 04/06/2018 1101   EOSABS 0.2 04/06/2018 1101   BASOSABS 0.1 04/06/2018 1101    Hgb A1C No results found for: HGBA1C         Assessment & Plan:    Nicki Reaper, NP This visit occurred during the SARS-CoV-2 public health emergency.  Safety protocols were in place, including screening questions prior to the visit, additional usage of staff PPE, and extensive cleaning of exam room while observing appropriate contact time as indicated for disinfecting solutions.

## 2020-10-01 ENCOUNTER — Encounter: Payer: Self-pay | Admitting: Certified Nurse Midwife

## 2020-10-01 ENCOUNTER — Ambulatory Visit: Payer: BC Managed Care – PPO | Admitting: Certified Nurse Midwife

## 2020-10-01 ENCOUNTER — Encounter: Payer: Self-pay | Admitting: Internal Medicine

## 2020-10-01 ENCOUNTER — Other Ambulatory Visit: Payer: Self-pay

## 2020-10-01 ENCOUNTER — Ambulatory Visit: Payer: BC Managed Care – PPO | Admitting: Internal Medicine

## 2020-10-01 VITALS — BP 132/83 | HR 84 | Temp 97.7°F | Resp 18 | Ht 62.5 in | Wt 185.6 lb

## 2020-10-01 VITALS — BP 135/85 | HR 85 | Resp 16 | Ht 62.5 in | Wt 185.8 lb

## 2020-10-01 DIAGNOSIS — H60392 Other infective otitis externa, left ear: Secondary | ICD-10-CM | POA: Diagnosis not present

## 2020-10-01 DIAGNOSIS — Z3169 Encounter for other general counseling and advice on procreation: Secondary | ICD-10-CM | POA: Diagnosis not present

## 2020-10-01 MED ORDER — HYDROCORTISONE-ACETIC ACID 1-2 % OT SOLN: 3.0000 [drp] | Freq: Three times a day (TID) | OTIC | 0 refills | Status: AC

## 2020-10-01 NOTE — Patient Instructions (Signed)

## 2020-10-01 NOTE — Patient Instructions (Signed)
Female Infertility  Female infertility refers to a woman's inability to get pregnant (conceive) after a year of having sex regularly (or after 6 months in women over age 21) without using birth control. Infertility can also mean that a woman is not able to carry a pregnancy to full term. Both women and men can have fertility problems. What are the causes? This condition may be caused by:  Problems with reproductive organs. Infertility can result if a woman: ? Has an abnormally short cervix or a cervix that does not remain closed during a pregnancy. ? Has a blockage or scarring in the fallopian tubes. ? Has an abnormally shaped uterus. ? Has uterine fibroids. This is a benign mass of tissue or muscle (tumor) that can develop in the uterus. ? Is not ovulating in a regular way.  Certain medical conditions. These may include: ? Polycystic ovary syndrome (PCOS). This is a hormonal disorder that can cause small cysts to grow on the ovaries. This is the most common cause of infertility in women. ? Endometriosis. This is a condition in which the tissue that lines the uterus (endometrium) grows outside of its normal location. ? Cancer and cancer treatments, such as chemotherapy or radiation. ? Premature ovarian failure. This is when ovaries stop producing eggs and hormones before age 40. ? Sexually transmitted diseases, such as chlamydia or gonorrhea. ? Autoimmune disorders. These are disorders in which the body's defense system (immune system) attacks normal, healthy cells. Infertility can be linked to more than one cause. For some women, the cause of infertility is not known (unexplained infertility). What increases the risk?  Age. A woman's fertility declines with age, especially after her mid-30s.  Being underweight or overweight.  Drinking too much alcohol.  Using drugs such as anabolic steroids, cocaine, and marijuana.  Exercising excessively.  Being exposed to environmental toxins,  such as radiation, pesticides, and certain chemicals. What are the signs or symptoms? The main sign of infertility in women is the inability to get pregnant or carry a pregnancy to full term. How is this diagnosed? This condition may be diagnosed by:  Checking whether you are ovulating each month. The tests may include: ? Blood tests to check hormone levels. ? An ultrasound of the ovaries. ? Taking a small tissue that lines the uterus and checking it under a microscope (endometrial biopsy).  Doing additional tests. This is done if ovulation is normal. Tests may include: ? Hysterosalpingography. This X-ray test can show the shape of the uterus and whether the fallopian tubes are open. ? Laparoscopy. This test uses a lighted tube (laparoscope) to look for problems in the fallopian tubes and other organs. ? Transvaginal ultrasound. This imaging test is used to check for abnormalities in the uterus and ovaries. ? Hysteroscopy. This test uses a lighted tube to check for problems in the cervix and the uterus. To be diagnosed with infertility, both partners will have a physical exam. Both partners will also have an extensive medical and sexual history taken. Additional tests may be done. How is this treated? Treatment depends on the cause of infertility. Most cases of infertility in women are treated with medicine or surgery.  Women may take medicine to: ? Correct ovulation problems. ? Treat other health conditions.  Surgery may be done to: ? Repair damage to the ovaries, fallopian tubes, cervix, or uterus. ? Remove growths from the uterus. ? Remove scar tissue from the uterus, pelvis, or other organs. Assisted reproductive technology (ART) Assisted reproductive technology (  ART) refers to all treatments and procedures that combine eggs and sperm outside the body to try to help a couple conceive. ART is often combined with fertility drugs to stimulate ovulation. Sometimes ART is done using eggs  retrieved from another woman's body (donor eggs) or from previously frozen fertilized eggs (embryos). There are different types of ART. These include:  Intrauterine insemination (IUI). A long, thin tube is used to place sperm directly into a woman's uterus. This procedure: ? Is effective for infertility caused by sperm problems, including low sperm count and low motility. ? Can be used in combination with fertility drugs.  In vitro fertilization (IVF). This is done when a woman's fallopian tubes are blocked or when a man has low sperm count. In this procedure: ? Fertility drugs are used to stimulate the ovaries to produce multiple eggs. ? Once mature, these eggs are removed from the body and combined with the sperm to be fertilized. ? The fertilized eggs are then placed into the woman's uterus. Follow these instructions at home:  Take over-the-counter and prescription medicines only as told by your health care provider.  Do not use any products that contain nicotine or tobacco, such as cigarettes and e-cigarettes. If you need help quitting, ask your health care provider.  If you drink alcohol, limit how much you have to 1 drink a day.  Make dietary changes to lose weight or maintain a healthy weight. Work with your health care provider and a dietitian to set a weight-loss goal that is healthy and reasonable for you.  Seek support from a counselor or support group to talk about your concerns related to infertility. Couples counseling may be helpful for you and your partner.  Practice stress reduction techniques that work well for you, such as regular physical activity, meditation, or deep breathing.  Keep all follow-up visits as told by your health care provider. This is important. Contact a health care provider if you:  Feel that stress is interfering with your life and relationships.  Have side effects from treatments for infertility. Summary  Female infertility refers to a woman's  inability to get pregnant (conceive) after a year of having sex regularly (or after 6 months in women over age 21) without using birth control.  To be diagnosed with infertility, both partners will have a physical exam. Both partners will also have an extensive medical and sexual history taken.  Seek support from a counselor or support group to talk about your concerns related to infertility. Couples counseling may be helpful for you and your partner. This information is not intended to replace advice given to you by your health care provider. Make sure you discuss any questions you have with your health care provider. Document Revised: 02/08/2020 Document Reviewed: 12/29/2019 Elsevier Patient Education  2021 Elsevier Inc.  

## 2020-10-01 NOTE — Progress Notes (Signed)
Subjective:    Tamara Mcclain is a 20 y.o. female who presents for evaluation of infertility. Patient and partner have been attempting conception for 6 months. Pt state she has been using ovulation kit and app on her phone to predict ovulation but it has not shown that she has ovulated.  Marital status: engaged. Pregnancies with current partner: no.  Menstrual and Endocrine History LMP Patient's last menstrual period was 09/25/2020 (approximate).           Duration of flow several days  Heavy menses yes  Clots no  Intermenstrual bleeding no  Postcoital bleeding no  Dysmenorrhea no  Amenorrhea no  Weight change no  Hirsutism no  Balding no  Acne no  Galactorrhea no   Obstetrical History Never pregnant, not confirmed pt state she had a positive pregnancy test in April and a week later started bleeding, believes she had a miscarriage.  Gynecologic History Last PAP 09/05/20  Previous abdominal or pelvic surgery no  Pelvic pain no  Endometriosis no  Hot flashes no  DES exposure no  Abnormal Pap no  Cervix Cryo/cone no  Sexually transmitted diseases no  Pelvic inflammatory disease no   Infertility and Endocrine Studies Basal body temperature no  Endo with biopsy no  Hysterosalpingogram no  Post-coital test no  Laparoscopy no  Hormonal studies no  Semen analysis no  Other studies no  Medications none  Other therapies Not applicable  Insemination not applicable   Contraception None   Habits Cigarettes:    Wife -  no    Husband - in the past stopped last year. Alcohol:    Wife -  no    Husband - no Marijuana:   Wife - no   Husband - as a teenager, none current  The following portions of the patient's history were reviewed and updated as appropriate: allergies, current medications, past family history, past medical history, past social history, past surgical history and problem list.  Review of Systems Pertinent items are noted in HPI.    Marital  History: engaged   Paternity of Pregnancies: Number with this partner: 0 Number with other partners: 0 Age of youngest child: not applicable     Objective:    Female Exam BP 135/85   Pulse 85   Resp 16   Ht 5' 2.5" (1.588 m)   Wt 185 lb 12.8 oz (84.3 kg)   LMP 09/25/2020 (Approximate)   BMI 33.44 kg/m  Wt Readings from Last 1 Encounters:  10/01/20 185 lb 12.8 oz (84.3 kg)   BMI: Body mass index is 33.44 kg/m. No exam performed today, patient combative and not indicated to discuss infertility treatment.  Patient Name: Tamara Mcclain DOB: 02-04-2001 MRN: [id number]   ULTRASOUND REPORT  Location: [facility]  Date of Service: 06/25/2020     Indications:Infertility   Findings:  The uterus is Axial and measures 6.8 x 3.7 x 2.9 cm. Echo texture is homogenous without evidence of focal masses. The Endometrium measures 5.6 mm.  Right Ovary measures 3.2 x 2.4 x 1.8 cm. It is normal in appearance. Left Ovary measures 2.9 x 3.0 x 2.7 cm. It is normal in appearance. Survey of the adnexa demonstrates no adnexal masses. There is no free fluid in the cul de sac.  Impression: 1. Normal pelvic ultrasound.   Recommendations: 1.Clinical correlation with the patient's History and Physical Exam.  Gweneth Dimitri, RT   Images reviewed.  Normal GYN study without visualized pathology.  Multiple  follicles are visualized on both ovaries.    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      Plan:  Pt encouraged to have partner semen analysis completed given he is 20 yrs old and has no children. Discussed use of femera to help induce ovulation after progesterone level and semen analysis completed.   All questions answered. Follow up in day 20 of cycle for progesterone level. weeks.    Face to face time 15 min Philip Aspen, CNM

## 2020-10-01 NOTE — Progress Notes (Signed)
Subjective:    Patient ID: Tamara Mcclain, female    DOB: June 25, 2000, 20 y.o.   MRN: [id number]  HPI  Pt presents to the clinic today with c/o nasal congestion, left ear pain, post nasal drip and cough. She reports this started 3 days ago. She is not blowing anything out of her nose. She describes the left ear pain as achy and pressure. She denies drainage or loss of hearing. She denies difficulty swallowing. The cough is dry and non productive, worse at night. She denies headache, runny nose, sore throat or SOB. She denies fever, chills or body aches. She has had a negative Covid test. She is taking Mucinex and Flonase OTC with minimal relief of symptoms.  Review of Systems      Past Medical History:  Diagnosis Date  . Anxiety   . Epiploic appendagitis   . MTHFR mutation   . Von Willebrand disease (HCC) Dr. Janee Morn at [facility]    Current Outpatient Medications  Medication Sig Dispense Refill  . ibuprofen (ADVIL) 200 MG tablet Take by mouth.     No current facility-administered medications for this visit.    Allergies  Allergen Reactions  . Tylenol [Acetaminophen] Other (See Comments)    fever  . Depo-Medrol [Methylprednisolone] Nausea Only    Feeling flushed    Family History  Problem Relation Age of Onset  . Arthritis Maternal Grandmother   . Hyperlipidemia Maternal Grandmother   . CAD Maternal Grandfather   . Cancer Paternal Grandmother        melanoma, sinus cancer  . Colon polyps Paternal Grandmother   . Esophageal cancer Neg Hx   . Stomach cancer Neg Hx   . Liver disease Neg Hx     Social History   Socioeconomic History  . Marital status: Single    Spouse name: Not on file  . Number of children: Not on file  . Years of education: Not on file  . Highest education level: Not on file  Occupational History  . Not on file  Tobacco Use  . Smoking status: Never Smoker  . Smokeless tobacco: Never Used  Vaping Use  . Vaping Use: Never used  Substance and  Sexual Activity  . Alcohol use: No    Alcohol/week: 0.0 standard drinks  . Drug use: No  . Sexual activity: Yes    Birth control/protection: None  Other Topics Concern  . Not on file  Social History Narrative  . Not on file   Social Determinants of Health   Financial Resource Strain: Not on file  Food Insecurity: Not on file  Transportation Needs: Not on file  Physical Activity: Not on file  Stress: Not on file  Social Connections: Not on file  Intimate Partner Violence: Not on file     Constitutional: Denies fever, malaise, fatigue, headache or abrupt weight changes.  HEENT: Pt reports left ear pain, nasal congestion, post nasal drip. Denies eye pain, eye redness, ringing in the ears, wax buildup, runny nose, bloody nose, or sore throat. Respiratory: Pt reports cough. Denies difficulty breathing, shortness of breath, or sputum production.   Cardiovascular: Denies chest pain, chest tightness, palpitations or swelling in the hands or feet.   No other specific complaints in a complete review of systems (except as listed in HPI above).  Objective:   Physical Exam BP 132/83 (BP Location: Right Arm, Patient Position: Sitting, Cuff Size: Normal)   Pulse 84   Temp 97.7 F (36.5 C) (Temporal)  Resp 18   Ht 5' 2.5" (1.588 m)   Wt 185 lb 9.6 oz (84.2 kg)   LMP 09/25/2020 (Approximate)   SpO2 100%   BMI 33.41 kg/m   Wt Readings from Last 3 Encounters:  09/05/20 184 lb (83.5 kg)  07/29/20 175 lb (79.4 kg)  07/09/20 175 lb (79.4 kg)    General: Appears her stated age, obese, in NAD. Skin: Warm, dry and intact. HEENT: Head: normal shape and size; Eyes: sclera white and EOMs intact; Left Ear: Tm's gray and intact, normal light reflex, she has 2 pustules in external ear canal, with erythematous canal;  Neck:  No adenopathy noted.  Cardiovascular: Normal rate and rhythm. S1,S2 noted.  No murmur, rubs or gallops noted. Pulmonary/Chest: Normal effort and positive vesicular breath  sounds. No respiratory distress. No wheezes, rales or ronchi noted.  Neurological: Alert and oriented.   BMET    Component Value Date/Time   NA 136 07/05/2020 2200   K 3.4 (L) 07/05/2020 2200   CL 101 07/05/2020 2200   CO2 22 07/05/2020 2200   GLUCOSE 119 (H) 07/05/2020 2200   BUN 6 07/05/2020 2200   CREATININE 0.75 07/05/2020 2200   CALCIUM 9.2 07/05/2020 2200   GFRNONAA >60 07/05/2020 2200   GFRAA >60 04/09/2018 2151    Lipid Panel  No results found for: CHOL, TRIG, HDL, CHOLHDL, VLDL, LDLCALC  CBC    Component Value Date/Time   WBC 17.4 (H) 07/05/2020 2200   RBC 5.23 (H) 07/05/2020 2200   HGB 14.8 07/05/2020 2200   HCT 42.9 07/05/2020 2200   PLT 425 (H) 07/05/2020 2200   MCV 82.0 07/05/2020 2200   MCH 28.3 07/05/2020 2200   MCHC 34.5 07/05/2020 2200   RDW 13.2 07/05/2020 2200   LYMPHSABS 2.6 04/06/2018 1101   MONOABS 0.8 04/06/2018 1101   EOSABS 0.2 04/06/2018 1101   BASOSABS 0.1 04/06/2018 1101    Hgb A1C No results found for: HGBA1C          Assessment & Plan:   Left Otitis Externa:  Will trial Vosol HC 3 x day for 5 days Continue Flonase Stop Mucinex  Return precautions discussed   Nicki Reaper, NP This visit occurred during the SARS-CoV-2 public health emergency.  Safety protocols were in place, including screening questions prior to the visit, additional usage of staff PPE, and extensive cleaning of exam room while observing appropriate contact time as indicated for disinfecting solutions.

## 2020-10-02 ENCOUNTER — Encounter: Payer: Self-pay | Admitting: Internal Medicine

## 2020-10-04 ENCOUNTER — Encounter: Payer: Self-pay | Admitting: Internal Medicine

## 2020-10-04 ENCOUNTER — Other Ambulatory Visit: Payer: Self-pay

## 2020-10-04 ENCOUNTER — Telehealth (INDEPENDENT_AMBULATORY_CARE_PROVIDER_SITE_OTHER): Payer: BC Managed Care – PPO | Admitting: Internal Medicine

## 2020-10-04 VITALS — Ht 64.0 in | Wt 185.0 lb

## 2020-10-04 DIAGNOSIS — A084 Viral intestinal infection, unspecified: Secondary | ICD-10-CM

## 2020-10-04 MED ORDER — ONDANSETRON 4 MG PO TBDP: 4.0000 mg | ORAL_TABLET | Freq: Three times a day (TID) | ORAL | 0 refills | Status: DC | PRN

## 2020-10-04 NOTE — Progress Notes (Signed)
Virtual Visit via Video Note  I connected with Tamara Mcclain on 10/04/20 at  4:00 PM EDT by a video enabled telemedicine application and verified that I am speaking with the correct person using two identifiers.  Location: Patient: Home Provider: Office  Persons participating in this video call: Nicki Reaper, NP and Marinus Maw   I discussed the limitations of evaluation and management by telemedicine and the availability of in person appointments. The patient expressed understanding and agreed to proceed.  History of Present Illness:  Patient presents the clinic today for nausea, vomiting and diarrhea.  She reports this started 2 days ago.  She does have a history of epiploic appendagitis. She has vomited 5-6 times per day. She has had 8-9 loose stools today. She reports her vomit and her stool are clear/bilious. She reports she is having difficulty keeping any fluids down. She feels weak and intermittently lightheaded. No on in her home has similar symptoms. She has not recently been out to eat. She had a negative home covid test. She has been taking Emetrol and Imodium for her symptoms.   Past Medical History:  Diagnosis Date  . Anxiety   . Epiploic appendagitis   . MTHFR mutation   . Von Willebrand disease (HCC) Dr. Janee Morn at [facility]    Current Outpatient Medications  Medication Sig Dispense Refill  . acetic acid-hydrocortisone (VOSOL-HC) OTIC solution Place 3 drops into the left ear 3 (three) times daily for 5 days. 10 mL 0  . Fexofenadine HCl (MUCINEX ALLERGY PO) Take by mouth.    . fluticasone (FLONASE) 50 MCG/ACT nasal spray Place into both nostrils daily.    . Melatonin 10 MG CAPS Take by mouth.     No current facility-administered medications for this visit.    Allergies  Allergen Reactions  . Tylenol [Acetaminophen] Other (See Comments)    fever  . Depo-Medrol [Methylprednisolone] Nausea Only    Feeling flushed    Family History  Problem Relation Age of  Onset  . Arthritis Maternal Grandmother   . Hyperlipidemia Maternal Grandmother   . CAD Maternal Grandfather   . Cancer Paternal Grandmother        melanoma, sinus cancer  . Colon polyps Paternal Grandmother   . Esophageal cancer Neg Hx   . Stomach cancer Neg Hx   . Liver disease Neg Hx     Social History   Socioeconomic History  . Marital status: Single    Spouse name: Not on file  . Number of children: Not on file  . Years of education: Not on file  . Highest education level: Not on file  Occupational History  . Not on file  Tobacco Use  . Smoking status: Never Smoker  . Smokeless tobacco: Never Used  Vaping Use  . Vaping Use: Never used  Substance and Sexual Activity  . Alcohol use: No    Alcohol/week: 0.0 standard drinks  . Drug use: No  . Sexual activity: Yes    Birth control/protection: None  Other Topics Concern  . Not on file  Social History Narrative  . Not on file   Social Determinants of Health   Financial Resource Strain: Not on file  Food Insecurity: Not on file  Transportation Needs: Not on file  Physical Activity: Not on file  Stress: Not on file  Social Connections: Not on file  Intimate Partner Violence: Not on file     Constitutional: Denies fever, malaise, fatigue, headache or abrupt weight changes.  Respiratory:  Denies difficulty breathing, shortness of breath, cough or sputum production.   Cardiovascular: Denies chest pain, chest tightness, palpitations or swelling in the hands or feet.  Gastrointestinal: Patient reports nausea, vomiting and diarrhea.  Denies abdominal pain, bloating, constipation, or blood in the stool.  GU: Denies urgency, frequency, pain with urination, burning sensation, blood in urine, odor or discharge. Neurological: Pt reports weakness and lightheadedness. Denies difficulty with memory, difficulty with speech or problems with balance and coordination.    No other specific complaints in a complete review of systems  (except as listed in HPI above).  Observations/Objective:  LMP 09/25/2020 (Approximate)  Wt Readings from Last 3 Encounters:  10/01/20 185 lb 9.6 oz (84.2 kg)  10/01/20 185 lb 12.8 oz (84.3 kg)  09/05/20 184 lb (83.5 kg)    General: Appears unwell but in NAD. Pulmonary/Chest:  No respiratory distress.  Neurological: Alert and oriented.    BMET    Component Value Date/Time   NA 136 07/05/2020 2200   K 3.4 (L) 07/05/2020 2200   CL 101 07/05/2020 2200   CO2 22 07/05/2020 2200   GLUCOSE 119 (H) 07/05/2020 2200   BUN 6 07/05/2020 2200   CREATININE 0.75 07/05/2020 2200   CALCIUM 9.2 07/05/2020 2200   GFRNONAA >60 07/05/2020 2200   GFRAA >60 04/09/2018 2151    Lipid Panel  No results found for: CHOL, TRIG, HDL, CHOLHDL, VLDL, LDLCALC  CBC    Component Value Date/Time   WBC 17.4 (H) 07/05/2020 2200   RBC 5.23 (H) 07/05/2020 2200   HGB 14.8 07/05/2020 2200   HCT 42.9 07/05/2020 2200   PLT 425 (H) 07/05/2020 2200   MCV 82.0 07/05/2020 2200   MCH 28.3 07/05/2020 2200   MCHC 34.5 07/05/2020 2200   RDW 13.2 07/05/2020 2200   LYMPHSABS 2.6 04/06/2018 1101   MONOABS 0.8 04/06/2018 1101   EOSABS 0.2 04/06/2018 1101   BASOSABS 0.1 04/06/2018 1101    Hgb A1C No results found for: HGBA1C      Assessment and Plan:  Viral Gastroenteritis:  Encouraged rest, fluids and BRAT diet Encouraged frequent handwashing and wash the toilet handle, sink knob and door know with Lysol RX for Zofran 4 mg ODT Q8H prn Ok to continue Imodium/Pepto Bismol as needed  Return/ER precautions discussed Follow Up Instructions:    I discussed the assessment and treatment plan with the patient. The patient was provided an opportunity to ask questions and all were answered. The patient agreed with the plan and demonstrated an understanding of the instructions.   The patient was advised to call back or seek an in-person evaluation if the symptoms worsen or if the condition fails to improve as  anticipated.     Nicki Reaper, NP

## 2020-10-04 NOTE — Patient Instructions (Signed)

## 2020-10-05 ENCOUNTER — Other Ambulatory Visit: Payer: Self-pay

## 2020-10-05 ENCOUNTER — Encounter: Payer: Self-pay | Admitting: Intensive Care

## 2020-10-05 ENCOUNTER — Emergency Department
Admission: EM | Admit: 2020-10-05 | Discharge: 2020-10-05 | Disposition: A | Discharge status: Home / Self Care | Payer: BC Managed Care – PPO | Attending: Emergency Medicine | Admitting: Emergency Medicine

## 2020-10-05 DIAGNOSIS — R109 Unspecified abdominal pain: Secondary | ICD-10-CM | POA: Diagnosis not present

## 2020-10-05 DIAGNOSIS — R197 Diarrhea, unspecified: Secondary | ICD-10-CM | POA: Insufficient documentation

## 2020-10-05 DIAGNOSIS — R112 Nausea with vomiting, unspecified: Secondary | ICD-10-CM

## 2020-10-05 LAB — COMPREHENSIVE METABOLIC PANEL
ALT: 30 U/L (ref 0–44)
AST: 39 U/L (ref 15–41)
Albumin: 4.2 g/dL (ref 3.5–5.0)
Alkaline Phosphatase: 79 U/L (ref 38–126)
Anion gap: 11 (ref 5–15)
BUN: 17 mg/dL (ref 6–20)
CO2: 21 mmol/L — ABNORMAL LOW (ref 22–32)
Calcium: 9.5 mg/dL (ref 8.9–10.3)
Chloride: 104 mmol/L (ref 98–111)
Creatinine, Ser: 0.92 mg/dL (ref 0.44–1.00)
GFR, Estimated: >60 mL/min (ref >60)
Glucose, Bld: 108 mg/dL — ABNORMAL HIGH (ref 70–99)
Potassium: 3.5 mmol/L (ref 3.5–5.1)
Sodium: 136 mmol/L (ref 135–145)
Total Bilirubin: 1.2 mg/dL (ref 0.3–1.2)
Total Protein: 8.1 g/dL (ref 6.5–8.1)

## 2020-10-05 LAB — URINALYSIS, COMPLETE (UACMP) WITH MICROSCOPIC
Bilirubin Urine: NEGATIVE
Glucose, UA: NEGATIVE mg/dL
Ketones, ur: 20 mg/dL — AB
Nitrite: NEGATIVE
Protein, ur: 30 mg/dL — AB
Specific Gravity, Urine: 1.028 (ref 1.005–1.030)
pH: 5 (ref 5.0–8.0)

## 2020-10-05 LAB — CBC
HCT: 46.5 % — ABNORMAL HIGH (ref 36.0–46.0)
Hemoglobin: 16 g/dL — ABNORMAL HIGH (ref 12.0–15.0)
MCH: 28 pg (ref 26.0–34.0)
MCHC: 34.4 g/dL (ref 30.0–36.0)
MCV: 81.3 fL (ref 80.0–100.0)
Platelets: 382 10*3/uL (ref 150–400)
RBC: 5.72 MIL/uL — ABNORMAL HIGH (ref 3.87–5.11)
RDW: 12.3 % (ref 11.5–15.5)
WBC: 7.8 10*3/uL (ref 4.0–10.5)
nRBC: 0 % (ref 0.0–0.2)

## 2020-10-05 LAB — LIPASE, BLOOD: Lipase: 21 U/L (ref 11–51)

## 2020-10-05 LAB — POC URINE PREG, ED: Preg Test, Ur: NEGATIVE

## 2020-10-05 MED ORDER — SODIUM CHLORIDE 0.9 % IV BOLUS
1000.0000 mL | Freq: Once | INTRAVENOUS | Status: AC
  Administered 2020-10-05: 1000 mL via INTRAVENOUS

## 2020-10-05 MED ORDER — ONDANSETRON HCL 4 MG/2ML IJ SOLN
4.0000 mg | Freq: Once | INTRAMUSCULAR | Status: AC
  Administered 2020-10-05: 4 mg via INTRAVENOUS
  Filled 2020-10-05: qty 2

## 2020-10-05 MED ORDER — ONDANSETRON 4 MG PO TBDP: 4.0000 mg | ORAL_TABLET | Freq: Three times a day (TID) | ORAL | 0 refills | Status: DC | PRN

## 2020-10-05 MED ORDER — ONDANSETRON 4 MG PO TBDP
4.0000 mg | ORAL_TABLET | Freq: Once | ORAL | Status: AC | PRN
  Administered 2020-10-05: 4 mg via ORAL
  Filled 2020-10-05: qty 1

## 2020-10-05 NOTE — ED Triage Notes (Signed)
Pt c/o N/V/D since Wednesday. Reports unable to hold anything down by mouth.

## 2020-10-05 NOTE — ED Notes (Signed)
Pt signed printed d/c paperwork.  

## 2020-10-05 NOTE — ED Notes (Signed)
Pt reports diarrhea and nausea since Wednesday. States mild tenderness all across abdomen. Denies fever, SOB, CP, HA.

## 2020-10-05 NOTE — ED Notes (Signed)
Pt up to restroom to provide urine sample.

## 2020-10-05 NOTE — ED Notes (Signed)
Pt updated on wait. Pt verbalizes understanding.  

## 2020-10-05 NOTE — ED Provider Notes (Signed)
[facility] Emergency Department Provider Note  Time seen: 8:21 PM  I have reviewed the triage vital signs and the nursing notes.   HISTORY  Chief Complaint Emesis and Diarrhea   HPI Tamara Mcclain is a 20 y.o. female with a past medical history of anxiety, presents to the emergency department for nausea vomiting diarrhea.  According to the patient for the past 4 days she has had nausea vomiting and diarrhea.  States abdominal cramping but denies any focal abdominal pain.  No fever cough or shortness of breath.  Patient states last menstrual period was approximately 1 to 2 weeks ago.  States whenever she drinks or eats she throws up shortly afterwards, unable to keep anything down.  Concerned over dehydration so she came to the emergency department for evaluation.   Past Medical History:  Diagnosis Date  . Anxiety   . Epiploic appendagitis   . MTHFR mutation   . Von Willebrand disease (HCC) Dr. Janee Morn at [facility]    Patient Active Problem List   Diagnosis Date Noted  . Panic attacks 12/05/2019  . 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 Von Willebrand disease (HCC) 08/18/2016    Past Surgical History:  Procedure Laterality Date  . ESOPHAGOGASTRODUODENOSCOPY (EGD) WITH PROPOFOL N/A 01/30/2020   Procedure: ESOPHAGOGASTRODUODENOSCOPY (EGD) WITH PROPOFOL;  Surgeon: Wyline Mood, MD;  Location: [facility] ENDOSCOPY;  Service: Gastroenterology;  Laterality: N/A;  . WISDOM TOOTH EXTRACTION  08/2018   no bleeding complications    Prior to Admission medications   Medication Sig Start Date End Date Taking? Authorizing Provider  acetic acid-hydrocortisone (VOSOL-HC) OTIC solution Place 3 drops into the left ear 3 (three) times daily for 5 days. 10/01/20 10/06/20  Lorre Munroe, NP  Fexofenadine HCl [facility] ALLERGY PO) Take by mouth.    [provider]  fluticasone (FLONASE) 50 MCG/ACT nasal spray Place into both nostrils daily.    [provider]  Melatonin  10 MG CAPS Take by mouth.    [provider]  ondansetron (ZOFRAN ODT) 4 MG disintegrating tablet Take 1 tablet (4 mg total) by mouth every 8 (eight) hours as needed for nausea or vomiting. 10/04/20   Lorre Munroe, NP    Allergies  Allergen Reactions  . Tylenol [Acetaminophen] Other (See Comments)    fever  . Depo-Medrol [Methylprednisolone] Nausea Only    Feeling flushed    Family History  Problem Relation Age of Onset  . Arthritis Maternal Grandmother   . Hyperlipidemia Maternal Grandmother   . CAD Maternal Grandfather   . Cancer Paternal Grandmother        melanoma, sinus cancer  . Colon polyps Paternal Grandmother   . Esophageal cancer Neg Hx   . Stomach cancer Neg Hx   . Liver disease Neg Hx     Social History Social History   Tobacco Use  . Smoking status: Never Smoker  . Smokeless tobacco: Never Used  Vaping Use  . Vaping Use: Never used  Substance Use Topics  . Alcohol use: No    Alcohol/week: 0.0 standard drinks  . Drug use: No    Review of Systems Constitutional: Negative for fever. Cardiovascular: Negative for chest pain. Respiratory: Negative for shortness of breath. Gastrointestinal: Diffuse mild abdominal cramping.  Positive for nausea vomiting diarrhea. Genitourinary: Negative for urinary compaints Musculoskeletal: Negative for musculoskeletal complaints Neurological: Negative for headache All other ROS negative  ____________________________________________   PHYSICAL EXAM:  VITAL SIGNS: ED Triage Vitals  Enc Vitals Group  BP 10/05/20 1607 (!) 146/108     Pulse Rate 10/05/20 1607 (!) 128     Resp 10/05/20 1607 18     Temp 10/05/20 1607 97.7 F (36.5 C)     Temp Source 10/05/20 1607 Oral     SpO2 10/05/20 1607 97 %     Weight 10/05/20 1608 185 lb (83.9 kg)     Height 10/05/20 1608 5\' 1"  (1.549 m)     Head Circumference --      Peak Flow --      Pain Score 10/05/20 1608 10     Pain Loc --      Pain Edu? --      Excl. in  GC? --    Constitutional: Alert and oriented. Well appearing and in no distress. Eyes: Normal exam ENT      Head: Normocephalic and atraumatic.      Mouth/Throat: Dry mucous membranes Cardiovascular: Normal rate, regular rhythm. Respiratory: Normal respiratory effort without tachypnea nor retractions. Breath sounds are clear  Gastrointestinal: Soft, mild diffuse tenderness without n/v/d Musculoskeletal: Nontender with normal range of motion in all extremities.  Neurologic:  Normal speech and language. No gross focal neurologic deficits  Skin:  Skin is warm, dry and intact.  Psychiatric: Mood and affect are normal.   ____________________________________________   INITIAL IMPRESSION / ASSESSMENT AND PLAN / ED COURSE  Pertinent labs & imaging results that were available during my care of the patient were reviewed by me and considered in my medical decision making (see chart for details).   Patient presents emergency department for nausea vomiting diarrhea for the past 4 days unable to keep anything down today.  Patient has mild abdominal discomfort on palpation but no focal tenderness identified.  Patient does appear somewhat dehydrated with dry appearing mucous membranes.  We will IV hydrate, treat with Zofran.  Blood work is largely nonrevealing, urinalysis pending.  Urinalysis equivocal but no clear UTI.  Urine culture sent as a precaution.  Patient is feeling better.  We will discharge with Zofran and PCP follow-up.  Discussed supportive care at home.  Also discussed return precautions.  Tamara Mcclain was evaluated in Emergency Department on 10/05/2020 for the symptoms described in the history of present illness. She was evaluated in the context of the global COVID-19 pandemic, which necessitated consideration that the patient might be at risk for infection with the SARS-CoV-2 virus that causes COVID-19. Institutional protocols and algorithms that pertain to the evaluation of patients at  risk for COVID-19 are in a state of rapid change based on information released by regulatory bodies including the CDC and federal and state organizations. These policies and algorithms were followed during the patient's care in the ED.  ____________________________________________   FINAL CLINICAL IMPRESSION(S) / ED DIAGNOSES  Nausea vomiting diarrhea   10/07/2020, MD 10/05/20 2222

## 2020-10-05 NOTE — ED Notes (Signed)
Pt reminded urine sample needed when she is able. Given another specimen cup as pt lost previous one.

## 2020-10-05 NOTE — ED Notes (Signed)
Pt laying calmly in bed; on personal phone; skin dry; resp reg/unlabored.

## 2020-10-05 NOTE — ED Notes (Signed)
Pt given another warm blanket.  

## 2020-10-06 ENCOUNTER — Other Ambulatory Visit: Payer: Self-pay | Admitting: Gastroenterology

## 2020-10-11 ENCOUNTER — Other Ambulatory Visit: Payer: Self-pay

## 2020-10-11 ENCOUNTER — Other Ambulatory Visit: Payer: BC Managed Care – PPO

## 2020-10-11 DIAGNOSIS — Z3169 Encounter for other general counseling and advice on procreation: Secondary | ICD-10-CM

## 2020-10-12 LAB — PROGESTERONE: Progesterone: 0.3 ng/mL

## 2020-10-21 ENCOUNTER — Ambulatory Visit: Payer: BC Managed Care – PPO | Admitting: Certified Nurse Midwife

## 2020-10-21 ENCOUNTER — Other Ambulatory Visit: Payer: Self-pay

## 2020-10-21 VITALS — BP 138/87 | HR 85 | Ht 62.0 in | Wt 183.6 lb

## 2020-10-21 DIAGNOSIS — N926 Irregular menstruation, unspecified: Secondary | ICD-10-CM

## 2020-10-21 DIAGNOSIS — Z3169 Encounter for other general counseling and advice on procreation: Secondary | ICD-10-CM | POA: Diagnosis not present

## 2020-10-21 LAB — POCT URINE PREGNANCY: Preg Test, Ur: NEGATIVE

## 2020-10-21 NOTE — Progress Notes (Signed)
OB/GYN CONFERENCE NOTE:  Subjective:       Tamara Mcclain is a 20 y.o. [id number] female who presents for a conference appointment. Current complaints include: missed menses, infertility.   Patient states she is 4 days late on her cycle , she had negative UPT today in the office. She state she has increased breast tenderness and is never late.   Reviewed progestin level and next steps for ovulation induction with medications.   Gynecologic History Patient's last menstrual period was 09/25/2020 (approximate). Contraception: none  Obstetric History OB History  Gravida Para Term Preterm AB Living  1       1    SAB IAB Ectopic Multiple Live Births  1            # Outcome Date GA Lbr Len/2nd Weight Sex Delivery Anes PTL Lv  1 SAB             Past Medical History:  Diagnosis Date   Anxiety    Epiploic appendagitis    MTHFR mutation    Von Willebrand disease (HCC) Dr. Janee Morn at [facility]    Past Surgical History:  Procedure Laterality Date   ESOPHAGOGASTRODUODENOSCOPY (EGD) WITH PROPOFOL N/A 01/30/2020   Procedure: ESOPHAGOGASTRODUODENOSCOPY (EGD) WITH PROPOFOL;  Surgeon: Wyline Mood, MD;  Location: [facility] ENDOSCOPY;  Service: Gastroenterology;  Laterality: N/A;   WISDOM TOOTH EXTRACTION  08/2018   no bleeding complications    Current Outpatient Medications on File Prior to Visit  Medication Sig Dispense Refill   Fexofenadine HCl (MUCINEX ALLERGY PO) Take by mouth.     fluticasone (FLONASE) 50 MCG/ACT nasal spray Place into both nostrils daily.     Melatonin 10 MG CAPS Take by mouth.     ondansetron (ZOFRAN ODT) 4 MG disintegrating tablet Take 1 tablet (4 mg total) by mouth every 8 (eight) hours as needed for nausea or vomiting. 20 tablet 0   No current facility-administered medications on file prior to visit.    Allergies  Allergen Reactions   Tylenol [Acetaminophen] Other (See Comments)    fever   Depo-Medrol [Methylprednisolone] Nausea Only    Feeling flushed    Social  History   Socioeconomic History   Marital status: Single    Spouse name: Not on file   Number of children: Not on file   Years of education: Not on file   Highest education level: Not on file  Occupational History   Not on file  Tobacco Use   Smoking status: Never   Smokeless tobacco: Never  Vaping Use   Vaping Use: Never used  Substance and Sexual Activity   Alcohol use: No    Alcohol/week: 0.0 standard drinks   Drug use: No   Sexual activity: Yes    Birth control/protection: None  Other Topics Concern   Not on file  Social History Narrative   Not on file   Social Determinants of Health   Financial Resource Strain: Not on file  Food Insecurity: Not on file  Transportation Needs: Not on file  Physical Activity: Not on file  Stress: Not on file  Social Connections: Not on file  Intimate Partner Violence: Not on file    Family History  Problem Relation Age of Onset   Arthritis Maternal Grandmother    Hyperlipidemia Maternal Grandmother    CAD Maternal Grandfather    Cancer Paternal Grandmother        melanoma, sinus cancer   Colon polyps Paternal Grandmother    Esophageal cancer Neg  Hx    Stomach cancer Neg Hx    Liver disease Neg Hx     The following portions of the patient's history were reviewed and updated as appropriate: allergies, current medications, past family history, past medical history, past social history, past surgical history and problem list.  Review of Systems Pertinent items are noted in HPI.   Objective:   LMP 09/25/2020 (Approximate)    Assessment/Plan:   Patient Active Problem List   Diagnosis Date Noted   Panic attacks 12/05/2019   Epiploic appendagitis 06/28/2019   Von Willebrand disease (HCC) 08/18/2016       Time: 10 min. Reviewing results, discussed necessity of semen analysis , reviewed ovulation induction with Femara. She verbalizes and agrees. Beta quant level today. Discussed induction of period in 2 wks if negative  beta quant and cycle does not occur on its own.   Return to Clinic: once semen analysis completed for review of Femara use. She verbalizes and agrees to plan.    Doreene Burke, CNM [facility]

## 2020-10-22 LAB — BETA HCG QUANT (REF LAB): hCG Quant: <1 m[IU]/mL

## 2020-12-04 ENCOUNTER — Encounter: Payer: Self-pay | Admitting: Emergency Medicine

## 2020-12-04 ENCOUNTER — Ambulatory Visit: Payer: Self-pay

## 2020-12-04 ENCOUNTER — Other Ambulatory Visit: Payer: Self-pay

## 2020-12-04 ENCOUNTER — Ambulatory Visit
Admission: EM | Admit: 2020-12-04 | Discharge: 2020-12-04 | Disposition: A | Discharge status: Home / Self Care | Payer: BC Managed Care – PPO | Attending: Emergency Medicine | Admitting: Emergency Medicine

## 2020-12-04 DIAGNOSIS — S91331A Puncture wound without foreign body, right foot, initial encounter: Secondary | ICD-10-CM | POA: Diagnosis not present

## 2020-12-04 MED ORDER — DOXYCYCLINE HYCLATE 100 MG PO CAPS: 100.0000 mg | ORAL_CAPSULE | Freq: Two times a day (BID) | ORAL | 0 refills | Status: AC

## 2020-12-04 MED ORDER — TETANUS-DIPHTH-ACELL PERTUSSIS 5-2.5-18.5 LF-MCG/0.5 IM SUSY
0.5000 mL | PREFILLED_SYRINGE | Freq: Once | INTRAMUSCULAR | Status: AC
  Administered 2020-12-04: 0.5 mL via INTRAMUSCULAR

## 2020-12-04 NOTE — ED Provider Notes (Signed)
Chief Complaint   Chief Complaint  Patient presents with   Foot Injury     Subjective, HPI  Tamara Mcclain is a 20 y.o. female who presents with right foot injury which occurred about 45 minutes ago.  Patient states that she was outside with her dog walking through an area with brush when she stepped on something that penetrated her foot and went through her sandal.  Patient reports bleeding to the area and extreme discomfort with walking.  She does not report any additional injuries, falls.  History obtained from patient.  Patient's problem list, past medical and social history, medications, and allergies were reviewed by me and updated in Epic.    ROS  See HPI.  Objective   Vitals:   12/04/20 1348  BP: (!) 134/93  Pulse: 97  Resp: 16  Temp: 98.4 F (36.9 C)    Vital signs and nursing note reviewed.   General: Appears well-developed and well-nourished. No acute distress.  Head: Normocephalic and atraumatic.   Neck: Normal range of motion, neck is supple.  Cardiovascular: Normal rate. Pulm/Chest: No respiratory distress.  Musculoskeletal: Right foot: Puncture wound noted to arch of right foot without appreciated foreign body present. 5/5 strength, full sensation, 2+  pulses, < 2 sec cap refill.  Neurological: Alert and oriented to person, place, and time.  Skin: Skin is warm and dry.   Psychiatric: Normal mood, affect, behavior, and thought content.    Data  No results found for any visits on 12/04/20.    Assessment & Plan  1. Puncture wound of right foot, initial encounter  Meds ordered this encounter  Medications   Tdap (BOOSTRIX) injection 0.5 mL   doxycycline (VIBRAMYCIN) 100 MG capsule    Sig: Take 1 capsule (100 mg total) by mouth 2 (two) times daily for 5 days.    Dispense:  10 capsule    Refill:  0    Order Specific Question:   Supervising Provider    Answer:   Merrilee Jansky [[id number]]     20 y.o. female presents with right foot injury which  occurred about 45 minutes ago.  Patient states that she was outside with her dog walking through an area with brush when she stepped on something that penetrated her foot and went through her sandal.  Patient reports bleeding to the area and extreme discomfort with walking.  She does not report any additional injuries, falls.  Chart review completed.  Puncture wound of unknown etiology to right foot.  Patient received a Tdap in clinic today as her last injection was in 2012.  Rx doxycycline to the patient's preferred pharmacy twice daily for the next 5 days and prevention of infection.  Advised about home treatment and care as outlined in her AVS.  Return as needed.  Stable on discharge.  Patient verbalized understanding and agreed with plan.  Plan:   Discharge Instructions      Take medications as prescribed. Keep right foot elevated and iced Warm epsom salt soaks 2-3 times daily Return as needed        Amalia Greenhouse, FNP-C 12/04/20   This note was partially made with the aid of speech-to-text dictation; typographical errors are not intentional.    Amalia Greenhouse, FNP 12/04/20 1403

## 2020-12-04 NOTE — Discharge Instructions (Addendum)
Take medications as prescribed. Keep right foot elevated and iced Warm epsom salt soaks 2-3 times daily Return as needed

## 2020-12-04 NOTE — ED Triage Notes (Signed)
Patient c/o RT foot injury at 1300 today.   Patient endorses difficulty with ambulation.   Patient states " she stepped on something and it went through her shoe and into her foot".   Patient is unaware of last tetanus.   Patient rates pain 10/10.

## 2020-12-04 NOTE — Telephone Encounter (Signed)
Pt. Was walking dog and possibly stepped on a nail. Pain to right foot, bottom of foot. Has a puncture wound that is bleeding slightly. No availablity in the practice today. Will go to UC.    Reason for Disposition  [1] SEVERE pain (e.g., excruciating, unable to do any normal activities) AND [2] not improved after 2 hours of pain medicine  Answer Assessment - Initial Assessment Questions 1. ONSET: "When did the pain start?"      Today 2. LOCATION: "Where is the pain located?"      Bottom of foot 3. PAIN: "How bad is the pain?"    (Scale 1-10; or mild, moderate, severe)  - MILD (1-3): doesn't interfere with normal activities.   - MODERATE (4-7): interferes with normal activities (e.g., work or school) or awakens from sleep, limping.   - SEVERE (8-10): excruciating pain, unable to do any normal activities, unable to walk.      9 4. WORK OR EXERCISE: "Has there been any recent work or exercise that involved this part of the body?"      Possibly stepped 5. CAUSE: "What do you think is causing the foot pain?"     Maybe stepped on a nail. 6. OTHER SYMPTOMS: "Do you have any other symptoms?" (e.g., leg pain, rash, fever, numbness)     Pain 7. PREGNANCY: "Is there any chance you are pregnant?" "When was your last menstrual period?"     No  Protocols used: Foot Pain-A-AH

## 2020-12-05 NOTE — Telephone Encounter (Signed)
noted 

## 2020-12-20 ENCOUNTER — Encounter: Payer: Self-pay | Admitting: Obstetrics and Gynecology

## 2020-12-20 ENCOUNTER — Ambulatory Visit: Payer: BC Managed Care – PPO | Admitting: Obstetrics and Gynecology

## 2020-12-20 ENCOUNTER — Other Ambulatory Visit: Payer: Self-pay

## 2020-12-20 VITALS — BP 124/78 | Ht 61.0 in | Wt 187.0 lb

## 2020-12-20 DIAGNOSIS — Z3169 Encounter for other general counseling and advice on procreation: Secondary | ICD-10-CM | POA: Diagnosis not present

## 2020-12-20 NOTE — Patient Instructions (Signed)
[email or web address]  1) Email me with your next menstrual cycle start date 2) With the next cycle we will start the letrozole on day 3 and schedule the HSG test in the first 10 days of the cycle

## 2020-12-20 NOTE — Progress Notes (Signed)
Obstetrics & Gynecology Office Visit   Chief Complaint:  Chief Complaint  Patient presents with   Fertility    History of Present Illness: 20 y.o. G1P0010 presenting for follow up of infertility.  She has had a prior work up which was included normal ultrasound, normal labs (slight increase in LH/FSH ratio).  She has not tried to conceive unassisted for 12 months.  Significant other currently can afford semen analysis.  She has not had and HSG.  OPK have been positive at home.  Serum progesterone checked 10/11/2020 unclear when in cycle but anovulatory  Review of Systems: Review of Systems  Constitutional: Negative.   Gastrointestinal: Negative.   Genitourinary: Negative.    Past Medical History:  Past Medical History:  Diagnosis Date   Anxiety    Epiploic appendagitis    MTHFR mutation    Von Willebrand disease (HCC) Dr. Janee Morn at [facility]    Past Surgical History:  Past Surgical History:  Procedure Laterality Date   ESOPHAGOGASTRODUODENOSCOPY (EGD) WITH PROPOFOL N/A 01/30/2020   Procedure: ESOPHAGOGASTRODUODENOSCOPY (EGD) WITH PROPOFOL;  Surgeon: Wyline Mood, MD;  Location: [facility] ENDOSCOPY;  Service: Gastroenterology;  Laterality: N/A;   WISDOM TOOTH EXTRACTION  08/2018   no bleeding complications    Gynecologic History: Patient's last menstrual period was 12/16/2020 (exact date).  Obstetric History: G1P0010  Family History:  Family History  Problem Relation Age of Onset   Arthritis Maternal Grandmother    Hyperlipidemia Maternal Grandmother    CAD Maternal Grandfather    Cancer Paternal Grandmother        melanoma, sinus cancer   Colon polyps Paternal Grandmother    Esophageal cancer Neg Hx    Stomach cancer Neg Hx    Liver disease Neg Hx     Social History:  Social History   Socioeconomic History   Marital status: Single    Spouse name: Not on file   Number of children: Not on file   Years of education: Not on file   Highest education level: Not on file   Occupational History   Not on file  Tobacco Use   Smoking status: Never   Smokeless tobacco: Never  Vaping Use   Vaping Use: Never used  Substance and Sexual Activity   Alcohol use: No    Alcohol/week: 0.0 standard drinks   Drug use: No   Sexual activity: Yes    Birth control/protection: None  Other Topics Concern   Not on file  Social History Narrative   Not on file   Social Determinants of Health   Financial Resource Strain: Not on file  Food Insecurity: Not on file  Transportation Needs: Not on file  Physical Activity: Not on file  Stress: Not on file  Social Connections: Not on file  Intimate Partner Violence: Not on file    Allergies:  Allergies  Allergen Reactions   Tylenol [Acetaminophen] Other (See Comments)    fever   Depo-Medrol [Methylprednisolone] Nausea Only    Feeling flushed    Medications: Prior to Admission medications   Medication Sig Start Date End Date Taking? Authorizing Provider  Melatonin 10 MG CAPS Take by mouth.   Yes [provider]  Fexofenadine HCl (MUCINEX ALLERGY PO) Take by mouth. Patient not taking: No sig reported    [provider]  fluticasone (FLONASE) 50 MCG/ACT nasal spray Place into both nostrils daily. Patient not taking: No sig reported    [provider]  ibuprofen (ADVIL) 800 MG tablet Take 800  mg by mouth 3 (three) times daily. 11/28/20   [provider]  ondansetron (ZOFRAN ODT) 4 MG disintegrating tablet Take 1 tablet (4 mg total) by mouth every 8 (eight) hours as needed for nausea or vomiting. Patient not taking: No sig reported 10/05/20   Minna Antis, MD    Physical Exam Vitals:  Vitals:   12/20/20 0855  BP: 124/78   Patient's last menstrual period was 12/16/2020 (exact date).  General: NAD HEENT: normocephalic, anicteric Pulmonary: No increased work of breathing Neurologic: Grossly intact Psychiatric: mood appropriate, affect full  Female chaperone present for  pelvic  portions of the physical exam  Assessment: 20 y.o. G1P0010 follow up infertility  Plan: Problem List Items Addressed This Visit   None Visit Diagnoses     Infertility counseling    -  Primary      1) Semen analysis - significant other did at home test but currently lacks funding for semen analysis secondary to being uninsured  2) OKP has been positive at home.  Discussed limited benefit of letrozole in setting of ovulation but is interested in trial with next cycle  3) HSG to be scheduled with next cycle  4) Return if symptoms worsen or fail to improve.   Vena Austria, MD, [facility], [facility] 12/20/2020, 9:21 AM

## 2021-01-01 ENCOUNTER — Ambulatory Visit: Payer: Self-pay | Admitting: *Deleted

## 2021-01-01 NOTE — Telephone Encounter (Addendum)
Pt triaged earlier, see encounter. Called back to practice. 2nd attempt to reach pt, Voice mail full, unable to leave message.

## 2021-01-01 NOTE — Telephone Encounter (Signed)
Many ant bites on Monday, left ankle swollen.bites are pink and fluid filled.  Soak in cold soapy water daily. Use benadry/hydrocortisone cream/calamine to help with the itching. Apply to a dry foot antibiotic ointment to help prevent infection. Do not pop bumps. With fever, increased swelling/redness call back for appointment.  Reason for Disposition . All other insect bites  Answer Assessment - Initial Assessment Questions 1. SEVERITY: "How many stings are there?"     A lot, "many" 2. ONSET: "When did it occur?"      Monday 3. LOCATION: "Where is the sting located?"  "How many stings?"     feet 4. SWELLING: "How big is the swelling?" (e.g., inches or cm)     Left foot some swelling 5. REDNESS: "Is the area red or pink?" If Yes, ask: "What size is the area of redness?" (e.g., inches or cm). "When did the redness start?"     Pink bumps 6. PAIN: "Is there any pain?" If Yes, ask: "How bad is it?"  (Scale 1-10; or mild, moderate, severe)     moderate 7. ITCHING: "Is there any itching?" If Yes, ask: "How bad is it?"      Yes, some 8. RESPIRATORY DISTRESS: "Describe your breathing."     Wnl, no respiratory issues. 9. PRIOR REACTIONS: "Have you had any severe allergic reactions to stings in the past?" If yes, ask: "What happened?"     None  10. OTHER SYMPTOMS: "Do you have any other symptoms?" (e.g., abdominal pain, face or tongue swelling, new rash elsewhere, vomiting)       *No Answer* 11. PREGNANCY: "Is there any chance you are pregnant?" "When was your last menstrual period?"       *No Answer*  Protocols used: Environmental health practitioner, Insect Bite-A-AH

## 2021-01-01 NOTE — Telephone Encounter (Signed)
Patient called and call was disconnected. Called patient back to review symptoms. No answer, unable to leave voicemail due to mailbox full and not accepting messages at this time.

## 2021-01-09 ENCOUNTER — Other Ambulatory Visit: Payer: Self-pay | Admitting: Obstetrics and Gynecology

## 2021-01-09 DIAGNOSIS — N970 Female infertility associated with anovulation: Secondary | ICD-10-CM

## 2021-01-09 DIAGNOSIS — N979 Female infertility, unspecified: Secondary | ICD-10-CM

## 2021-01-09 MED ORDER — LETROZOLE 2.5 MG PO TABS: 2.5000 mg | ORAL_TABLET | Freq: Every day | ORAL | 0 refills | Status: AC

## 2021-01-09 NOTE — Telephone Encounter (Signed)
Patient called back to confirm scheduled appointment for HSG

## 2021-01-09 NOTE — Telephone Encounter (Signed)
Left voicemail for patient to call back to confirm scheduled appointment . [facility] 01/16/21 Medical mall entrance arrive at 11:30

## 2021-01-09 NOTE — Telephone Encounter (Signed)
Patient contacted office . Patient is scheduled 01/09/21 for labs

## 2021-01-09 NOTE — Progress Notes (Signed)
LMP 01/09/2021 cycle I letrozole 2.5mg  day 21 progesterone 9/21  HSG 01/16/2021

## 2021-01-16 ENCOUNTER — Other Ambulatory Visit: Payer: Self-pay

## 2021-01-16 ENCOUNTER — Ambulatory Visit
Admission: RE | Admit: 2021-01-16 | Discharge: 2021-01-16 | Disposition: A | Discharge status: Home / Self Care | Payer: BC Managed Care – PPO | Source: Ambulatory Visit | Attending: Obstetrics and Gynecology | Admitting: Obstetrics and Gynecology

## 2021-01-16 DIAGNOSIS — N979 Female infertility, unspecified: Secondary | ICD-10-CM | POA: Diagnosis present

## 2021-01-16 MED ORDER — IOHEXOL 300 MG/ML  SOLN
300.0000 mL | Freq: Once | INTRAMUSCULAR | Status: AC | PRN
  Administered 2021-01-16: 10 mL

## 2021-01-29 ENCOUNTER — Other Ambulatory Visit: Payer: Self-pay

## 2021-01-29 ENCOUNTER — Other Ambulatory Visit: Payer: BC Managed Care – PPO

## 2021-01-29 DIAGNOSIS — N970 Female infertility associated with anovulation: Secondary | ICD-10-CM

## 2021-01-29 DIAGNOSIS — Z3490 Encounter for supervision of normal pregnancy, unspecified, unspecified trimester: Secondary | ICD-10-CM

## 2021-01-29 DIAGNOSIS — N979 Female infertility, unspecified: Secondary | ICD-10-CM

## 2021-01-30 LAB — PROGESTERONE: Progesterone: 10.1 ng/mL

## 2021-01-30 LAB — BETA HCG QUANT (REF LAB): hCG Quant: <1 m[IU]/mL

## 2021-02-01 ENCOUNTER — Other Ambulatory Visit: Payer: Self-pay

## 2021-02-01 ENCOUNTER — Emergency Department
Admission: EM | Admit: 2021-02-01 | Discharge: 2021-02-01 | Disposition: A | Discharge status: Home / Self Care | Payer: BC Managed Care – PPO | Attending: Emergency Medicine | Admitting: Emergency Medicine

## 2021-02-01 ENCOUNTER — Ambulatory Visit
Admission: EM | Admit: 2021-02-01 | Discharge: 2021-02-01 | Disposition: A | Discharge status: Home / Self Care | Payer: BC Managed Care – PPO | Attending: Family Medicine | Admitting: Family Medicine

## 2021-02-01 ENCOUNTER — Encounter: Payer: Self-pay | Admitting: Emergency Medicine

## 2021-02-01 DIAGNOSIS — R109 Unspecified abdominal pain: Secondary | ICD-10-CM | POA: Diagnosis not present

## 2021-02-01 DIAGNOSIS — R1084 Generalized abdominal pain: Secondary | ICD-10-CM | POA: Diagnosis not present

## 2021-02-01 DIAGNOSIS — Z5321 Procedure and treatment not carried out due to patient leaving prior to being seen by health care provider: Secondary | ICD-10-CM | POA: Diagnosis not present

## 2021-02-01 DIAGNOSIS — R112 Nausea with vomiting, unspecified: Secondary | ICD-10-CM | POA: Insufficient documentation

## 2021-02-01 LAB — COMPREHENSIVE METABOLIC PANEL
ALT: 18 U/L (ref 0–44)
AST: 22 U/L (ref 15–41)
Albumin: 3.8 g/dL (ref 3.5–5.0)
Alkaline Phosphatase: 73 U/L (ref 38–126)
Anion gap: 8 (ref 5–15)
BUN: 8 mg/dL (ref 6–20)
CO2: 25 mmol/L (ref 22–32)
Calcium: 9.3 mg/dL (ref 8.9–10.3)
Chloride: 104 mmol/L (ref 98–111)
Creatinine, Ser: 0.66 mg/dL (ref 0.44–1.00)
GFR, Estimated: >60 mL/min (ref >60)
Glucose, Bld: 132 mg/dL — ABNORMAL HIGH (ref 70–99)
Potassium: 4.2 mmol/L (ref 3.5–5.1)
Sodium: 137 mmol/L (ref 135–145)
Total Bilirubin: 1 mg/dL (ref 0.3–1.2)
Total Protein: 7.3 g/dL (ref 6.5–8.1)

## 2021-02-01 LAB — POCT URINALYSIS DIP (MANUAL ENTRY)
Bilirubin, UA: NEGATIVE
Glucose, UA: NEGATIVE mg/dL
Ketones, POC UA: NEGATIVE mg/dL
Nitrite, UA: NEGATIVE
Protein Ur, POC: NEGATIVE mg/dL
Spec Grav, UA: <=1.005 — AB (ref 1.010–1.025)
Urobilinogen, UA: 0.2 E.U./dL
pH, UA: 6 (ref 5.0–8.0)

## 2021-02-01 LAB — CBC
HCT: 41.4 % (ref 36.0–46.0)
Hemoglobin: 14.7 g/dL (ref 12.0–15.0)
MCH: 29.3 pg (ref 26.0–34.0)
MCHC: 35.5 g/dL (ref 30.0–36.0)
MCV: 82.6 fL (ref 80.0–100.0)
Platelets: 371 10*3/uL (ref 150–400)
RBC: 5.01 MIL/uL (ref 3.87–5.11)
RDW: 12.4 % (ref 11.5–15.5)
WBC: 10.1 10*3/uL (ref 4.0–10.5)
nRBC: 0 % (ref 0.0–0.2)

## 2021-02-01 LAB — LIPASE, BLOOD: Lipase: 27 U/L (ref 11–51)

## 2021-02-01 LAB — POCT URINE PREGNANCY: Preg Test, Ur: NEGATIVE

## 2021-02-01 MED ORDER — ONDANSETRON 4 MG PO TBDP
4.0000 mg | ORAL_TABLET | Freq: Once | ORAL | Status: AC | PRN
  Administered 2021-02-01: 4 mg via ORAL

## 2021-02-01 MED ORDER — ONDANSETRON 4 MG PO TBDP
ORAL_TABLET | ORAL | Status: AC
  Administered 2021-02-01: 4 mg via ORAL
  Filled 2021-02-01: qty 1

## 2021-02-01 NOTE — ED Triage Notes (Signed)
Pt c/o sharp abdominal pain and n/v starting last night.  Pain score 9/10.  Pt increases w/ palpation. Sts BM made pain worse this morning.

## 2021-02-01 NOTE — ED Provider Notes (Addendum)
Emergency Medicine Provider Triage Evaluation Note  LYNZE REDDY, a 20 y.o. female  was evaluated in triage.  Pt complains of sudden onset of RLQ pain progressing to left sided, then generalized abdominal pain last night. She had one episode of non-bloody/non-bilious post-prandial emesis  She denies any abdominal surgeries, and LMP was 3 weeks ago. She reports a normal bowel movement this morning that subsequently increased symptoms. She denies diarrhea, dysuria, or abnormal vaginal bleeding. She presents from [facility] where UA/Upreg was performed.   Review of Systems  Positive: Abdominal pain Negative: Dysuria  Physical Exam  BP (!) 142/104 (BP Location: Left Arm)   Pulse 97   Temp 98.7 F (37.1 C) (Oral)   Resp 18   Ht 5\' 1"  (1.549 m)   Wt 83.9 kg   LMP 01/11/2021 (Approximate)   SpO2 97%   BMI 34.96 kg/m  Gen:   Awake, no distress  NAD Resp:  Normal effort CTA MSK:   Moves extremities without difficulty  Other:  ABD: mildly tender to palp generally  Medical Decision Making  Medically screening exam initiated at 11:55 AM.  Appropriate orders placed.  BERTIE MCCONATHY was informed that the remainder of the evaluation will be completed by another provider, this initial triage assessment does not replace that evaluation, and the importance of remaining in the ED until their evaluation is complete.  Patient with ED evaluation of generalized abdominal pain.    [name], PA-C 02/01/21 [address], [address], PA-C 02/01/21 1201    02/03/21, MD 02/02/21 [id number]

## 2021-02-01 NOTE — ED Provider Notes (Signed)
Renaldo Fiddler    CSN: [id number] Arrival date & time: 02/01/21  1053      History   Chief Complaint Chief Complaint  Patient presents with   Abdominal Pain    HPI Tamara Mcclain is a 20 y.o. female.   HPI Patient presents today with abdominal pain x 1 day. Abdominal pain initially developed at the right lower quadrant and this morning was more prominent on left side. At present, abdominal pain is midline and radiating both left and right. She characterizes abdominal pain at present as very sharp and rates pain 9/10.Nauseated now with one episode of vomitus after attempting to eat. Bowel movement today solid and it made abdominal pain worse.  Patient has both appendix and gallbladder.   Past Medical History:  Diagnosis Date   Anxiety    Epiploic appendagitis    MTHFR mutation    Von Willebrand disease (HCC) Dr. Janee Morn at [facility]    Patient Active Problem List   Diagnosis Date Noted   Panic attacks 12/05/2019   Epiploic appendagitis 06/28/2019   Von Willebrand disease (HCC) 08/18/2016    Past Surgical History:  Procedure Laterality Date   ESOPHAGOGASTRODUODENOSCOPY (EGD) WITH PROPOFOL N/A 01/30/2020   Procedure: ESOPHAGOGASTRODUODENOSCOPY (EGD) WITH PROPOFOL;  Surgeon: Wyline Mood, MD;  Location: [facility] ENDOSCOPY;  Service: Gastroenterology;  Laterality: N/A;   WISDOM TOOTH EXTRACTION  08/2018   no bleeding complications    OB History     Gravida  1   Para      Term      Preterm      AB  1   Living         SAB  1   IAB      Ectopic      Multiple      Live Births               Home Medications    Prior to Admission medications   Medication Sig Start Date End Date Taking? Authorizing Provider  Fexofenadine HCl (MUCINEX ALLERGY PO) Take by mouth. Patient not taking: No sig reported    [provider]  fluticasone (FLONASE) 50 MCG/ACT nasal spray Place into both nostrils daily. Patient not taking: No sig reported     [provider]  ibuprofen (ADVIL) 800 MG tablet Take 800 mg by mouth 3 (three) times daily. 11/28/20   [provider]  Melatonin 10 MG CAPS Take by mouth.    [provider]  ondansetron (ZOFRAN ODT) 4 MG disintegrating tablet Take 1 tablet (4 mg total) by mouth every 8 (eight) hours as needed for nausea or vomiting. Patient not taking: No sig reported 10/05/20   Minna Antis, MD    Family History Family History  Problem Relation Age of Onset   Arthritis Maternal Grandmother    Hyperlipidemia Maternal Grandmother    CAD Maternal Grandfather    Cancer Paternal Grandmother        melanoma, sinus cancer   Colon polyps Paternal Grandmother    Esophageal cancer Neg Hx    Stomach cancer Neg Hx    Liver disease Neg Hx     Social History Social History   Tobacco Use   Smoking status: Never   Smokeless tobacco: Never  Vaping Use   Vaping Use: Never used  Substance Use Topics   Alcohol use: No    Alcohol/week: 0.0 standard drinks   Drug use: No     Allergies   Tylenol [acetaminophen]  and Depo-medrol [methylprednisolone]   Review of Systems Review of Systems Pertinent negatives listed in HPI   Physical Exam Triage Vital Signs ED Triage Vitals [02/01/21 1111]  Enc Vitals Group     BP      Pulse      Resp      Temp      Temp src      SpO2      Weight      Height      Head Circumference      Peak Flow      Pain Score 9     Pain Loc      Pain Edu?      Excl. in GC?    No data found.  Updated Vital Signs LMP 01/11/2021 (Approximate)   Visual Acuity Right Eye Distance:   Left Eye Distance:   Bilateral Distance:    Right Eye Near:   Left Eye Near:    Bilateral Near:     Physical Exam Constitutional:      Appearance: She is ill-appearing.  HENT:     Head: Normocephalic.  Eyes:     Extraocular Movements: Extraocular movements intact.  Cardiovascular:     Rate and Rhythm: Normal rate and regular rhythm.  Pulmonary:      Effort: Pulmonary effort is normal.  Abdominal:     General: Bowel sounds are decreased.     Palpations: Abdomen is soft.     Tenderness: There is abdominal tenderness in the right lower quadrant and left lower quadrant. There is right CVA tenderness, left CVA tenderness, guarding and rebound.     Hernia: No hernia is present.  Neurological:     Mental Status: She is alert.     UC Treatments / Results  Labs (all labs ordered are listed, but only abnormal results are displayed) Labs Reviewed  POCT URINALYSIS DIP (MANUAL ENTRY) - Abnormal; Notable for the following components:      Result Value   Clarity, UA cloudy (*)    Spec Grav, UA <=1.005 (*)    Blood, UA trace-intact (*)    Leukocytes, UA Moderate (2+) (*)    All other components within normal limits  POCT URINE PREGNANCY    EKG   Radiology No results found.  Procedures Procedures (including critical care time)  Medications Ordered in UC Medications - No data to display  Initial Impression / Assessment and Plan / UC Course  I have reviewed the triage vital signs and the nursing notes.  Pertinent labs & imaging results that were available during my care of the patient were reviewed by me and considered in my medical decision making (see chart for details).    Generalized abdominal pain, with pain level 9/10, patient is being directed to follow-up with at the Emergency Department for work   For further work-up and evaluation  Final Clinical Impressions(s) / UC Diagnoses   Final diagnoses:  Generalized abdominal pain   Discharge Instructions   None    ED Prescriptions   None    PDMP not reviewed this encounter.   Bing Neighbors, FNP 02/01/21 1140

## 2021-02-01 NOTE — ED Triage Notes (Signed)
Pt c/o right side pain that started last night after eating. She woke up this morning and pain moved to the left side and center of her abdomen. She rates the pain 9/10 and she also has some nausea.

## 2021-02-01 NOTE — Discharge Instructions (Addendum)
Go to emergency department for evaluation of abdominal pain.

## 2021-02-01 NOTE — ED Notes (Signed)
Administered at 12, no duplicate order

## 2021-02-05 ENCOUNTER — Telehealth: Payer: Self-pay

## 2021-02-05 ENCOUNTER — Ambulatory Visit: Payer: BC Managed Care – PPO | Admitting: Internal Medicine

## 2021-02-05 ENCOUNTER — Other Ambulatory Visit: Payer: Self-pay | Admitting: Obstetrics and Gynecology

## 2021-02-05 MED ORDER — LETROZOLE 2.5 MG PO TABS: 2.5000 mg | ORAL_TABLET | Freq: Every day | ORAL | 0 refills | Status: AC

## 2021-02-05 NOTE — Progress Notes (Deleted)
Subjective:    Patient ID: Tamara Mcclain, female    DOB: 11/13/2000, 20 y.o.   MRN: [id number]  HPI  Patient presents to clinic today for follow-up of chronic conditions.  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  She was sent to the ER for further evaluation.  Her labs were unremarkable.  She did leave without being seen.  Von Willebrand's Disease: She denies any issues at this time.  She does not follow with hematology.  Anxiety with Panic Attacks: She is not currently seeing a therapist.  She denies depression, SI/HI.  Review of Systems     Past Medical History:  Diagnosis Date   Anxiety    Epiploic appendagitis    MTHFR mutation    Von Willebrand disease (HCC) Dr. Janee Morn at [facility]    Current Outpatient Medications  Medication Sig Dispense Refill   Fexofenadine HCl (MUCINEX ALLERGY PO) Take by mouth. (Patient not taking: No sig reported)     fluticasone (FLONASE) 50 MCG/ACT nasal spray Place into both nostrils daily. (Patient not taking: No sig reported)     ibuprofen (ADVIL) 800 MG tablet Take 800 mg by mouth 3 (three) times daily.     Melatonin 10 MG CAPS Take by mouth.     ondansetron (ZOFRAN ODT) 4 MG disintegrating tablet Take 1 tablet (4 mg total) by mouth every 8 (eight) hours as needed for nausea or vomiting. (Patient not taking: No sig reported) 20 tablet 0   No current facility-administered medications for this visit.    Allergies  Allergen Reactions   Tylenol [Acetaminophen] Other (See Comments)    fever   Depo-Medrol [Methylprednisolone] Nausea Only    Feeling flushed    Family History  Problem Relation Age of Onset   Arthritis Maternal Grandmother    Hyperlipidemia Maternal Grandmother    CAD Maternal Grandfather    Cancer Paternal Grandmother        melanoma, sinus cancer   Colon polyps Paternal Grandmother    Esophageal cancer Neg Hx    Stomach cancer Neg Hx    Liver disease Neg Hx     Social  History   Socioeconomic History   Marital status: Single    Spouse name: Not on file   Number of children: Not on file   Years of education: Not on file   Highest education level: Not on file  Occupational History   Not on file  Tobacco Use   Smoking status: Never   Smokeless tobacco: Never  Vaping Use   Vaping Use: Never used  Substance and Sexual Activity   Alcohol use: No    Alcohol/week: 0.0 standard drinks   Drug use: No   Sexual activity: Yes    Birth control/protection: None  Other Topics Concern   Not on file  Social History Narrative   Not on file   Social Determinants of Health   Financial Resource Strain: Not on file  Food Insecurity: Not on file  Transportation Needs: Not on file  Physical Activity: Not on file  Stress: Not on file  Social Connections: Not on file  Intimate Partner Violence: Not on file     Constitutional: Denies fever, malaise, fatigue, headache or abrupt weight changes.  HEENT: Denies eye pain, eye redness, ear pain, ringing in the ears, wax buildup, runny nose, nasal congestion, bloody nose, or sore throat. Respiratory: Denies difficulty breathing, shortness of breath, cough or sputum production.   Cardiovascular:  Denies chest pain, chest tightness, palpitations or swelling in the hands or feet.  Gastrointestinal: Denies abdominal pain, bloating, constipation, diarrhea or blood in the stool.  GU: Denies urgency, frequency, pain with urination, burning sensation, blood in urine, odor or discharge. Musculoskeletal: Denies decrease in range of motion, difficulty with gait, muscle pain or joint pain and swelling.  Skin: Denies redness, rashes, lesions or ulcercations.  Neurological: Denies dizziness, difficulty with memory, difficulty with speech or problems with balance and coordination.  Psych: Patient has a history of anxiety panic attacks.  Denies depression, SI/HI.  No other specific complaints in a complete review of systems (except  as listed in HPI above).  Objective:   Physical Exam  LMP 01/11/2021 (Approximate)  Wt Readings from Last 3 Encounters:  12/20/20 187 lb (84.8 kg)  10/21/20 183 lb 9.6 oz (83.3 kg)  10/05/20 185 lb (83.9 kg)    General: Appears their stated age, well developed, well nourished in NAD. Skin: Warm, dry and intact. No rashes, lesions or ulcerations noted. HEENT: Head: normal shape and size; Eyes: sclera white, no icterus, conjunctiva pink, PERRLA and EOMs intact; Ears: Tm's gray and intact, normal light reflex; Nose: mucosa pink and moist, septum midline; Throat/Mouth: Teeth present, mucosa pink and moist, no exudate, lesions or ulcerations noted.  Neck:  Neck supple, trachea midline. No masses, lumps or thyromegaly present.  Cardiovascular: Normal rate and rhythm. S1,S2 noted.  No murmur, rubs or gallops noted. No JVD or BLE edema. No carotid bruits noted. Pulmonary/Chest: Normal effort and positive vesicular breath sounds. No respiratory distress. No wheezes, rales or ronchi noted.  Abdomen: Soft and nontender. Normal bowel sounds. No distention or masses noted. Liver, spleen and kidneys non palpable. Musculoskeletal: Normal range of motion. No signs of joint swelling. No difficulty with gait.  Neurological: Alert and oriented. Cranial nerves II-XII grossly intact. Coordination normal.  Psychiatric: Mood and affect normal. Behavior is normal. Judgment and thought content normal.    BMET    Component Value Date/Time   NA 137 02/01/2021 1155   K 4.2 02/01/2021 1155   CL 104 02/01/2021 1155   CO2 25 02/01/2021 1155   GLUCOSE 132 (H) 02/01/2021 1155   BUN 8 02/01/2021 1155   CREATININE 0.66 02/01/2021 1155   CALCIUM 9.3 02/01/2021 1155   GFRNONAA >60 02/01/2021 1155   GFRAA >60 04/09/2018 2151    Lipid Panel  No results found for: CHOL, TRIG, HDL, CHOLHDL, VLDL, LDLCALC  CBC    Component Value Date/Time   WBC 10.1 02/01/2021 1155   RBC 5.01 02/01/2021 1155   HGB 14.7  02/01/2021 1155   HCT 41.4 02/01/2021 1155   PLT 371 02/01/2021 1155   MCV 82.6 02/01/2021 1155   MCH 29.3 02/01/2021 1155   MCHC 35.5 02/01/2021 1155   RDW 12.4 02/01/2021 1155   LYMPHSABS 2.6 04/06/2018 1101   MONOABS 0.8 04/06/2018 1101   EOSABS 0.2 04/06/2018 1101   BASOSABS 0.1 04/06/2018 1101    Hgb A1C No results found for: HGBA1C          Assessment & Plan:     Nicki Reaper, NP This visit occurred during the SARS-CoV-2 public health emergency.  Safety protocols were in place, including screening questions prior to the visit, additional usage of staff PPE, and extensive cleaning of exam room while observing appropriate contact time as indicated for disinfecting solutions.

## 2021-02-05 NOTE — Telephone Encounter (Signed)
Pt calling; started period yesterday; needs rx for letrazole; needs to start it tomorrow.  [phone]

## 2021-02-05 NOTE — Progress Notes (Signed)
LMP 02/04/2021 cycle 2 letrozole 2.5mg 

## 2021-02-05 NOTE — Telephone Encounter (Signed)
You can let her now Rx is called start tomorrow 9/29

## 2021-02-07 NOTE — Telephone Encounter (Signed)
Left detailed msg 'rx has been sent in'.  

## 2021-02-19 ENCOUNTER — Ambulatory Visit
Admission: EM | Admit: 2021-02-19 | Discharge: 2021-02-19 | Disposition: A | Discharge status: Home / Self Care | Payer: BC Managed Care – PPO | Attending: Emergency Medicine | Admitting: Emergency Medicine

## 2021-02-19 ENCOUNTER — Other Ambulatory Visit: Payer: Self-pay

## 2021-02-19 DIAGNOSIS — J0100 Acute maxillary sinusitis, unspecified: Secondary | ICD-10-CM | POA: Diagnosis not present

## 2021-02-19 MED ORDER — AMOXICILLIN 875 MG PO TABS: 875.0000 mg | ORAL_TABLET | Freq: Two times a day (BID) | ORAL | 0 refills | Status: AC

## 2021-02-19 NOTE — Discharge Instructions (Addendum)
Take the amoxicillin as directed.  Follow up with your primary care provider if your symptoms are not improving.   ° ° °

## 2021-02-19 NOTE — ED Provider Notes (Signed)
Tamara Mcclain    CSN: [id number] Arrival date & time: 02/19/21  1324      History   Chief Complaint Chief Complaint  Patient presents with   Cough   Nasal Congestion    HPI Tamara Mcclain is a 20 y.o. female.  Patient presents with 6-7 day history of nasal congestion, postnasal drip, cough.  Negative at home COVID test today.  Treating symptoms at home with Zyrtec and nasal spray without relief.  She denies fever, chills, shortness of breath, or other symptoms.  Her medical history includes Willebrand disease, panic attacks, anxiety.  The history is provided by the patient and medical records.   Past Medical History:  Diagnosis Date   Anxiety    Epiploic appendagitis    MTHFR mutation    Von Willebrand disease Dr. Janee Morn at [facility]    Patient Active Problem List   Diagnosis Date Noted   Panic attacks 12/05/2019   Epiploic appendagitis 06/28/2019   Von Willebrand disease 08/18/2016    Past Surgical History:  Procedure Laterality Date   ESOPHAGOGASTRODUODENOSCOPY (EGD) WITH PROPOFOL N/A 01/30/2020   Procedure: ESOPHAGOGASTRODUODENOSCOPY (EGD) WITH PROPOFOL;  Surgeon: Wyline Mood, MD;  Location: [facility] ENDOSCOPY;  Service: Gastroenterology;  Laterality: N/A;   WISDOM TOOTH EXTRACTION  08/2018   no bleeding complications    OB History     Gravida  1   Para      Term      Preterm      AB  1   Living         SAB  1   IAB      Ectopic      Multiple      Live Births               Home Medications    Prior to Admission medications   Medication Sig Start Date End Date Taking? Authorizing Provider  amoxicillin (AMOXIL) 875 MG tablet Take 1 tablet (875 mg total) by mouth 2 (two) times daily for 7 days. 02/19/21 02/26/21 Yes Mickie Bail, NP  Fexofenadine HCl [facility] ALLERGY PO) Take by mouth. Patient not taking: No sig reported    [provider]  fluticasone (FLONASE) 50 MCG/ACT nasal spray Place into both nostrils  daily. Patient not taking: No sig reported    [provider]  ibuprofen (ADVIL) 800 MG tablet Take 800 mg by mouth 3 (three) times daily. 11/28/20   [provider]  Melatonin 10 MG CAPS Take by mouth.    [provider]  ondansetron (ZOFRAN ODT) 4 MG disintegrating tablet Take 1 tablet (4 mg total) by mouth every 8 (eight) hours as needed for nausea or vomiting. Patient not taking: No sig reported 10/05/20   Minna Antis, MD    Family History Family History  Problem Relation Age of Onset   Arthritis Maternal Grandmother    Hyperlipidemia Maternal Grandmother    CAD Maternal Grandfather    Cancer Paternal Grandmother        melanoma, sinus cancer   Colon polyps Paternal Grandmother    Esophageal cancer Neg Hx    Stomach cancer Neg Hx    Liver disease Neg Hx     Social History Social History   Tobacco Use   Smoking status: Never   Smokeless tobacco: Never  Vaping Use   Vaping Use: Never used  Substance Use Topics   Alcohol use: No    Alcohol/week: 0.0 standard drinks   Drug  use: No     Allergies   Tylenol [acetaminophen] and Depo-medrol [methylprednisolone]   Review of Systems Review of Systems  Constitutional:  Negative for chills and fever.  HENT:  Positive for congestion, postnasal drip and rhinorrhea. Negative for ear pain and sore throat.   Respiratory:  Positive for cough. Negative for shortness of breath.   Cardiovascular:  Negative for chest pain and palpitations.  Gastrointestinal:  Negative for abdominal pain and vomiting.  Skin:  Negative for color change and rash.  Neurological:  Negative for syncope.  All other systems reviewed and are negative.   Physical Exam Triage Vital Signs ED Triage Vitals  Enc Vitals Group     BP      Pulse      Resp      Temp      Temp src      SpO2      Weight      Height      Head Circumference      Peak Flow      Pain Score      Pain Loc      Pain Edu?      Excl. in GC?     No data found.  Updated Vital Signs BP (!) 135/93 (BP Location: Left Arm)   Pulse 97   Temp 98.1 F (36.7 C) (Temporal)   Resp 16   SpO2 98%   Visual Acuity Right Eye Distance:   Left Eye Distance:   Bilateral Distance:    Right Eye Near:   Left Eye Near:    Bilateral Near:     Physical Exam Vitals and nursing note reviewed.  Constitutional:      General: She is not in acute distress.    Appearance: She is well-developed.  HENT:     Head: Normocephalic and atraumatic.     Right Ear: Tympanic membrane normal.     Left Ear: Tympanic membrane normal.     Nose: Congestion and rhinorrhea present.     Mouth/Throat:     Mouth: Mucous membranes are moist.     Pharynx: Oropharynx is clear.  Eyes:     Conjunctiva/sclera: Conjunctivae normal.  Cardiovascular:     Rate and Rhythm: Normal rate and regular rhythm.     Heart sounds: Normal heart sounds.  Pulmonary:     Effort: Pulmonary effort is normal. No respiratory distress.     Breath sounds: Rhonchi present.     Comments: Scattered bilateral rhonchi which clears with cough. Abdominal:     Palpations: Abdomen is soft.     Tenderness: There is no abdominal tenderness.  Musculoskeletal:     Cervical back: Neck supple.  Skin:    General: Skin is warm and dry.  Neurological:     General: No focal deficit present.     Mental Status: She is alert and oriented to person, place, and time.  Psychiatric:        Mood and Affect: Mood normal.        Behavior: Behavior normal.     UC Treatments / Results  Labs (all labs ordered are listed, but only abnormal results are displayed) Labs Reviewed - No data to display  EKG   Radiology No results found.  Procedures Procedures (including critical care time)  Medications Ordered in UC Medications - No data to display  Initial Impression / Assessment and Plan / UC Course  I have reviewed the triage vital signs and the nursing notes.  Pertinent labs & imaging results  that were available during my care of the patient were reviewed by me and considered in my medical decision making (see chart for details).  Acute sinusitis.  Treating with amoxicillin.  Discussed symptomatic treatment including ibuprofen and Mucinex.  Instructed patient to follow-up with her PCP if her symptoms are not improving.  She agrees to plan of care.   Final Clinical Impressions(s) / UC Diagnoses   Final diagnoses:  Acute non-recurrent maxillary sinusitis     Discharge Instructions      Take the amoxicillin as directed.    Follow up with your primary care provider if your symptoms are not improving.         ED Prescriptions     Medication Sig Dispense Auth. Provider   amoxicillin (AMOXIL) 875 MG tablet Take 1 tablet (875 mg total) by mouth 2 (two) times daily for 7 days. 14 tablet Mickie Bail, NP      PDMP not reviewed this encounter.   Mickie Bail, NP 02/19/21 1406

## 2021-02-19 NOTE — ED Triage Notes (Signed)
Patient presents to Urgent Care with complaints of dry cough and nasal congestion since Friday. Negative covid test today.Treating symptoms with zyrtec.  Denies fever.

## 2021-03-25 ENCOUNTER — Encounter: Payer: Self-pay | Admitting: Emergency Medicine

## 2021-03-25 ENCOUNTER — Ambulatory Visit
Admission: EM | Admit: 2021-03-25 | Discharge: 2021-03-25 | Disposition: A | Discharge status: Home / Self Care | Payer: BC Managed Care – PPO | Attending: Emergency Medicine | Admitting: Emergency Medicine

## 2021-03-25 ENCOUNTER — Other Ambulatory Visit: Payer: Self-pay

## 2021-03-25 DIAGNOSIS — K529 Noninfective gastroenteritis and colitis, unspecified: Secondary | ICD-10-CM | POA: Diagnosis not present

## 2021-03-25 MED ORDER — ONDANSETRON 4 MG PO TBDP
4.0000 mg | ORAL_TABLET | Freq: Once | ORAL | Status: AC
  Administered 2021-03-25: 4 mg via ORAL

## 2021-03-25 MED ORDER — ONDANSETRON 4 MG PO TBDP: 4.0000 mg | ORAL_TABLET | Freq: Three times a day (TID) | ORAL | 0 refills | Status: DC | PRN

## 2021-03-25 NOTE — ED Triage Notes (Signed)
Pt here N/V since last night. No abdominal pain. Unable to give urine specimen in triage.

## 2021-03-25 NOTE — ED Provider Notes (Signed)
Presents [facility] [place]    CSN: [id number] Arrival date & time: 03/25/21  [id number]      History   Chief Complaint Chief Complaint  Patient presents with   Emesis   Nausea    HPI Tamara Mcclain is a 20 y.o. female.  Patient presents with nausea, vomiting, diarrhea last night.  She denies fever, chills, abdominal pain, dysuria, hematuria, or other symptoms.  No treatments at home.  LMP: Current.  Patient was seen here on 02/19/2021; diagnosed with sinusitis and treated with amoxicillin.  The history is provided by the patient and medical records.   Past Medical History:  Diagnosis Date   Anxiety    Epiploic appendagitis    MTHFR mutation    Von Willebrand disease Dr. Grandville Silos at [facility]    Patient Active Problem List   Diagnosis Date Noted   Panic attacks 12/05/2019   Epiploic appendagitis 06/28/2019   Von Willebrand disease 08/18/2016    Past Surgical History:  Procedure Laterality Date   ESOPHAGOGASTRODUODENOSCOPY (EGD) WITH PROPOFOL N/A 01/30/2020   Procedure: ESOPHAGOGASTRODUODENOSCOPY (EGD) WITH PROPOFOL;  Surgeon: Jonathon Bellows, MD;  Location: [facility] ENDOSCOPY;  Service: Gastroenterology;  Laterality: N/A;   WISDOM TOOTH EXTRACTION  08/2018   no bleeding complications    OB History     Gravida  1   Para      Term      Preterm      AB  1   Living         SAB  1   IAB      Ectopic      Multiple      Live Births               Home Medications    Prior to Admission medications   Medication Sig Start Date End Date Taking? Authorizing Provider  ondansetron (ZOFRAN ODT) 4 MG disintegrating tablet Take 1 tablet (4 mg total) by mouth every 8 (eight) hours as needed for nausea or vomiting. 03/25/21  Yes Sharion Balloon, NP  Fexofenadine HCl [facility] ALLERGY PO) Take by mouth. Patient not taking: No sig reported    [provider]  fluticasone (FLONASE) 50 MCG/ACT nasal spray Place into both nostrils daily. Patient not taking: No sig  reported    [provider]  ibuprofen (ADVIL) 800 MG tablet Take 800 mg by mouth 3 (three) times daily. 11/28/20   [provider]  Melatonin 10 MG CAPS Take by mouth.    [provider]    Family History Family History  Problem Relation Age of Onset   Arthritis Maternal Grandmother    Hyperlipidemia Maternal Grandmother    CAD Maternal Grandfather    Cancer Paternal Grandmother        melanoma, sinus cancer   Colon polyps Paternal Grandmother    Esophageal cancer Neg Hx    Stomach cancer Neg Hx    Liver disease Neg Hx     Social History Social History   Tobacco Use   Smoking status: Never   Smokeless tobacco: Never  Vaping Use   Vaping Use: Never used  Substance Use Topics   Alcohol use: No    Alcohol/week: 0.0 standard drinks   Drug use: No     Allergies   Tylenol [acetaminophen] and Depo-medrol [methylprednisolone]   Review of Systems Review of Systems  Constitutional:  Negative for chills and fever.  Respiratory:  Negative for cough and shortness of breath.   Gastrointestinal:  Positive for diarrhea, nausea and vomiting. Negative for abdominal pain.  Genitourinary:  Negative for dysuria and hematuria.  All other systems reviewed and are negative.   Physical Exam Triage Vital Signs ED Triage Vitals  Enc Vitals Group     BP      Pulse      Resp      Temp      Temp src      SpO2      Weight      Height      Head Circumference      Peak Flow      Pain Score      Pain Loc      Pain Edu?      Excl. in GC?    No data found.  Updated Vital Signs BP 131/89   Pulse 81   Temp 98.3 F (36.8 C) (Oral)   Resp 18   SpO2 98%   Visual Acuity Right Eye Distance:   Left Eye Distance:   Bilateral Distance:    Right Eye Near:   Left Eye Near:    Bilateral Near:     Physical Exam Vitals and nursing note reviewed.  Constitutional:      General: She is not in acute distress.    Appearance: She is well-developed.  HENT:      Head: Normocephalic and atraumatic.     Mouth/Throat:     Mouth: Mucous membranes are moist.  Eyes:     Conjunctiva/sclera: Conjunctivae normal.  Cardiovascular:     Rate and Rhythm: Normal rate and regular rhythm.     Heart sounds: Normal heart sounds.  Pulmonary:     Effort: Pulmonary effort is normal. No respiratory distress.     Breath sounds: Normal breath sounds.  Abdominal:     General: Bowel sounds are normal.     Palpations: Abdomen is soft.     Tenderness: There is no abdominal tenderness. There is no guarding or rebound.  Musculoskeletal:     Cervical back: Neck supple.  Skin:    General: Skin is warm and dry.  Neurological:     Mental Status: She is alert.  Psychiatric:        Mood and Affect: Mood normal.        Behavior: Behavior normal.     UC Treatments / Results  Labs (all labs ordered are listed, but only abnormal results are displayed) Labs Reviewed - No data to display  EKG   Radiology No results found.  Procedures Procedures (including critical care time)  Medications Ordered in UC Medications  ondansetron (ZOFRAN-ODT) disintegrating tablet 4 mg (4 mg Oral Given 03/25/21 1048)    Initial Impression / Assessment and Plan / UC Course  I have reviewed the triage vital signs and the nursing notes.  Pertinent labs & imaging results that were available during my care of the patient were reviewed by me and considered in my medical decision making (see chart for details).  Gastroenteritis.  Treating with Zofran.  Instructed patient to keep her self hydrated with clear liquids.  Education provided on nausea, vomiting, diarrhea.  ED precautions discussed.  Instructed patient to follow-up with her PCP if her symptoms are not improving.  Work note provided per patient request.  She agrees to plan of care.   Final Clinical Impressions(s) / UC Diagnoses   Final diagnoses:  Gastroenteritis     Discharge Instructions      Take the antinausea  medication  as directed.    Keep yourself hydrated with clear liquids, such as water, Gatorade, Pedialyte, Sprite, or ginger ale.    Go to the emergency department if you have acute worsening symptoms.    Follow up with your primary care provider if your symptoms are not improving.          ED Prescriptions     Medication Sig Dispense Auth. Provider   ondansetron (ZOFRAN ODT) 4 MG disintegrating tablet Take 1 tablet (4 mg total) by mouth every 8 (eight) hours as needed for nausea or vomiting. 20 tablet Sharion Balloon, NP      PDMP not reviewed this encounter.   Sharion Balloon, NP 03/25/21 1100

## 2021-03-25 NOTE — Discharge Instructions (Addendum)
Take the antinausea medication as directed.    Keep yourself hydrated with clear liquids, such as water, Gatorade, Pedialyte, Sprite, or ginger ale.    Go to the emergency department if you have acute worsening symptoms.    Follow up with your primary care provider if your symptoms are not improving.      

## 2021-04-14 ENCOUNTER — Ambulatory Visit: Payer: BC Managed Care – PPO | Admitting: Obstetrics and Gynecology

## 2021-04-21 ENCOUNTER — Encounter: Payer: Self-pay | Admitting: Obstetrics and Gynecology

## 2021-04-21 ENCOUNTER — Encounter: Payer: BC Managed Care – PPO | Admitting: Obstetrics and Gynecology

## 2021-04-21 ENCOUNTER — Other Ambulatory Visit: Payer: Self-pay

## 2021-04-23 NOTE — Progress Notes (Signed)
This encounter was created in error - please disregard.

## 2021-05-07 ENCOUNTER — Ambulatory Visit
Admission: EM | Admit: 2021-05-07 | Discharge: 2021-05-07 | Disposition: A | Discharge status: Home / Self Care | Payer: BC Managed Care – PPO | Attending: Medical Oncology | Admitting: Medical Oncology

## 2021-05-07 ENCOUNTER — Other Ambulatory Visit: Payer: Self-pay

## 2021-05-07 ENCOUNTER — Encounter: Payer: Self-pay | Admitting: Emergency Medicine

## 2021-05-07 ENCOUNTER — Ambulatory Visit: Payer: Self-pay

## 2021-05-07 DIAGNOSIS — J029 Acute pharyngitis, unspecified: Secondary | ICD-10-CM

## 2021-05-07 DIAGNOSIS — R0981 Nasal congestion: Secondary | ICD-10-CM

## 2021-05-07 DIAGNOSIS — R112 Nausea with vomiting, unspecified: Secondary | ICD-10-CM

## 2021-05-07 LAB — POCT INFLUENZA A/B
Influenza A, POC: NEGATIVE
Influenza B, POC: NEGATIVE

## 2021-05-07 MED ORDER — ONDANSETRON HCL 4 MG PO TABS: 4.0000 mg | ORAL_TABLET | Freq: Four times a day (QID) | ORAL | 0 refills | Status: DC

## 2021-05-07 MED ORDER — BENZONATATE 100 MG PO CAPS: 100.0000 mg | ORAL_CAPSULE | Freq: Three times a day (TID) | ORAL | 0 refills | Status: DC

## 2021-05-07 NOTE — Telephone Encounter (Signed)
Pt stated she has been feeling dizziness since yesterday, sore throat,sneezing. Pt stated experiencing nausea vomited twice today.   No chest pain  No sob  No fever   First appointment available is January 3rd. Pt stated she is at work and will not be available to speak until 3:15.   Seeking clinical advice  Left message to call back about her symptoms.

## 2021-05-07 NOTE — ED Provider Notes (Signed)
Tamara Mcclain    CSN: [id number] Arrival date & time: 05/07/21  1814      History   Chief Complaint Chief Complaint  Patient presents with   Nausea   Emesis    HPI Tamara Mcclain is a 20 y.o. female.   HPI  Nausea: Patient reports that she had nausea and one episode of vomiting this morning. Vomiting was non-bloody. Symptoms have resolved but she still has occasional nausea along with a runny nose and sore throat. No fevers, abdominal pain, pelvic pain, dysuria, diarrhea, constipation. She has tried fludis for symptoms. She asks for a work note. LMP: 04/17/2021.Fiance sick with similar symptoms.   Past Medical History:  Diagnosis Date   Anxiety    Epiploic appendagitis    MTHFR mutation    Von Willebrand disease Dr. Janee Morn at [facility]    Patient Active Problem List   Diagnosis Date Noted   Panic attacks 12/05/2019   Epiploic appendagitis 06/28/2019   Von Willebrand disease 08/18/2016    Past Surgical History:  Procedure Laterality Date   ESOPHAGOGASTRODUODENOSCOPY (EGD) WITH PROPOFOL N/A 01/30/2020   Procedure: ESOPHAGOGASTRODUODENOSCOPY (EGD) WITH PROPOFOL;  Surgeon: Wyline Mood, MD;  Location: [facility] ENDOSCOPY;  Service: Gastroenterology;  Laterality: N/A;   WISDOM TOOTH EXTRACTION  08/2018   no bleeding complications    OB History     Gravida  1   Para      Term      Preterm      AB  1   Living         SAB  1   IAB      Ectopic      Multiple      Live Births               Home Medications    Prior to Admission medications   Medication Sig Start Date End Date Taking? Authorizing Provider  ibuprofen (ADVIL) 800 MG tablet Take 800 mg by mouth 3 (three) times daily. 11/28/20   [provider]  Melatonin 10 MG CAPS Take by mouth.    [provider]    Family History Family History  Problem Relation Age of Onset   Arthritis Maternal Grandmother    Hyperlipidemia Maternal Grandmother    CAD Maternal  Grandfather    Cancer Paternal Grandmother        melanoma, sinus cancer   Colon polyps Paternal Grandmother    Esophageal cancer Neg Hx    Stomach cancer Neg Hx    Liver disease Neg Hx     Social History Social History   Tobacco Use   Smoking status: Never   Smokeless tobacco: Never  Vaping Use   Vaping Use: Never used  Substance Use Topics   Alcohol use: No    Alcohol/week: 0.0 standard drinks   Drug use: No     Allergies   Tylenol [acetaminophen] and Depo-medrol [methylprednisolone]   Review of Systems Review of Systems  As stated above in HPI Physical Exam Triage Vital Signs ED Triage Vitals  Enc Vitals Group     BP 05/07/21 1829 132/84     Pulse Rate 05/07/21 1829 (!) 105     Resp 05/07/21 1829 20     Temp 05/07/21 1829 98.5 F (36.9 C)     Temp Source 05/07/21 1829 Oral     SpO2 05/07/21 1829 100 %     Weight --      Height --  Head Circumference --      Peak Flow --      Pain Score 05/07/21 1831 0     Pain Loc --      Pain Edu? --      Excl. in GC? --    No data found.  Updated Vital Signs BP 132/84    Pulse (!) 105    Temp 98.5 F (36.9 C) (Oral)    Resp 20    LMP 04/17/2021 (Exact Date)    SpO2 100%   Physical Exam Vitals and nursing note reviewed.  Constitutional:      General: She is not in acute distress.    Appearance: Normal appearance. She is not ill-appearing, toxic-appearing or diaphoretic.  HENT:     Head: Normocephalic and atraumatic.     Right Ear: Tympanic membrane normal.     Left Ear: Tympanic membrane normal.     Nose: Congestion and rhinorrhea present.     Mouth/Throat:     Mouth: Mucous membranes are moist.     Pharynx: Oropharynx is clear. No oropharyngeal exudate or posterior oropharyngeal erythema.  Eyes:     Extraocular Movements: Extraocular movements intact.     Conjunctiva/sclera: Conjunctivae normal.     Pupils: Pupils are equal, round, and reactive to light.  Cardiovascular:     Rate and Rhythm: Normal  rate and regular rhythm.     Heart sounds: Normal heart sounds.  Pulmonary:     Effort: Pulmonary effort is normal.     Breath sounds: Normal breath sounds.  Abdominal:     General: Bowel sounds are normal. There is no distension.     Palpations: Abdomen is soft. There is no mass.     Tenderness: There is no abdominal tenderness. There is no right CVA tenderness, left CVA tenderness, guarding or rebound.     Hernia: No hernia is present.  Musculoskeletal:     Cervical back: Normal range of motion and neck supple.  Lymphadenopathy:     Cervical: No cervical adenopathy.  Skin:    General: Skin is warm.     Coloration: Skin is not jaundiced.  Neurological:     Mental Status: She is alert and oriented to person, place, and time.     UC Treatments / Results  Labs (all labs ordered are listed, but only abnormal results are displayed) Labs Reviewed - No data to display  EKG   Radiology No results found.  Procedures Procedures (including critical care time)  Medications Ordered in UC Medications - No data to display  Initial Impression / Assessment and Plan / UC Course  I have reviewed the triage vital signs and the nursing notes.  Pertinent labs & imaging results that were available during my care of the patient were reviewed by me and considered in my medical decision making (see chart for details).     New. Viral testing pending. For now rest, hydration, zofran, saline nasal spray, and tessalon. Follow up PRN.  Final Clinical Impressions(s) / UC Diagnoses   Final diagnoses:  None   Discharge Instructions   None    ED Prescriptions   None    PDMP not reviewed this encounter.   Rushie Chestnut, [place] 05/07/21 1855

## 2021-05-07 NOTE — ED Triage Notes (Signed)
Pt here with N/V today at work. 1 occurrence and is no longer nauseous. Pt kindly requests a note for work today so they know she was seen.

## 2021-05-07 NOTE — Telephone Encounter (Signed)
°  Chief Complaint: Sore throat Symptoms: Sore throat 8/10, dizziness sitting to standing, vomited bile x 2 today, nausea, subjective fever Frequency: Onset last night Pertinent Negatives: Patient denies SOB,cough,sinus pain,earache Disposition: [] ED /[x] Urgent Care (no appt availability in office) / [] Appointment(In office/virtual)/ []  [facility]/ [] Home Care/ [] Refused Recommended Disposition  Additional Notes: No availability, directed to UC, states will follow disposition.   Has not covid tested.       Reason for Disposition  SEVERE (e.g., excruciating) throat pain    Vomiting bile x 2 today  Answer Assessment - Initial Assessment Questions 1. ONSET: "When did the throat start hurting?" (Hours or days ago)      Yesterday 2. SEVERITY: "How bad is the sore throat?" (Scale 1-10; mild, moderate or severe)   - MILD (1-3):  doesn't interfere with eating or normal activities   - MODERATE (4-7): interferes with eating some solids and normal activities   - SEVERE (8-10):  excruciating pain, interferes with most normal activities   - SEVERE DYSPHAGIA: can't swallow liquids, drooling     5-6/10 during day, at night 8-9/10 3. STREP EXPOSURE: "Has there been any exposure to strep within the past week?" If Yes, ask: "What type of contact occurred?"      No 4.  VIRAL SYMPTOMS: "Are there any symptoms of a cold, such as a runny nose, cough, hoarse voice or red eyes?"      No cough 5. FEVER: "Do you have a fever?" If Yes, ask: "What is your temperature, how was it measured, and when did it start?"     Unsure, sweating at times 6. PUS ON THE TONSILS: "Is there pus on the tonsils in the back of your throat?"     Can't tell 7. OTHER SYMPTOMS: "Do you have any other symptoms?" (e.g., difficulty breathing, headache, rash)     Dizziness, intermittent, when getting up and down. Spinning. Nausea and vomiting x 2 today. "Like bile" BP last week bad headache 147/94  No headache  now.  Protocols used: Sore Throat-A-AH

## 2021-05-08 NOTE — Telephone Encounter (Signed)
The pt was seen at the urgent care on yesterday, 05/07/2021.

## 2021-05-08 NOTE — Telephone Encounter (Signed)
I could add her on Friday at 1 if she is not already gone to urgent care

## 2021-05-18 ENCOUNTER — Encounter: Payer: Self-pay | Admitting: Emergency Medicine

## 2021-05-18 ENCOUNTER — Ambulatory Visit
Admission: EM | Admit: 2021-05-18 | Discharge: 2021-05-18 | Disposition: A | Discharge status: Home / Self Care | Payer: BC Managed Care – PPO | Attending: Medical Oncology | Admitting: Medical Oncology

## 2021-05-18 DIAGNOSIS — N939 Abnormal uterine and vaginal bleeding, unspecified: Secondary | ICD-10-CM | POA: Insufficient documentation

## 2021-05-18 DIAGNOSIS — D6800 Von Willebrand disease, unspecified: Secondary | ICD-10-CM

## 2021-05-18 DIAGNOSIS — R8290 Unspecified abnormal findings in urine: Secondary | ICD-10-CM | POA: Diagnosis present

## 2021-05-18 LAB — POCT URINALYSIS DIP (MANUAL ENTRY)
Bilirubin, UA: NEGATIVE
Glucose, UA: NEGATIVE mg/dL
Ketones, POC UA: NEGATIVE mg/dL
Nitrite, UA: NEGATIVE
Protein Ur, POC: 30 mg/dL — AB
Spec Grav, UA: >=1.03 — AB (ref 1.010–1.025)
Urobilinogen, UA: 0.2 E.U./dL
pH, UA: 6.5 (ref 5.0–8.0)

## 2021-05-18 LAB — POCT URINE PREGNANCY: Preg Test, Ur: NEGATIVE

## 2021-05-18 MED ORDER — TRANEXAMIC ACID 650 MG PO TABS: 1300.0000 mg | ORAL_TABLET | Freq: Three times a day (TID) | ORAL | 0 refills | Status: AC

## 2021-05-18 MED ORDER — MEDROXYPROGESTERONE ACETATE 10 MG PO TABS: 20.0000 mg | ORAL_TABLET | Freq: Three times a day (TID) | ORAL | 0 refills | Status: DC

## 2021-05-18 NOTE — ED Provider Notes (Signed)
[facility]    CSN: [id number] Arrival date & time: 05/18/21  0950      History   Chief Complaint Chief Complaint  Patient presents with   Vaginal Bleeding    HPI Elyse JarvisKatelyn E Mcclain is a 21 y.o. female.   HPI  Vaginal Bleeding: Patient reports that she started her menstrual cycle 3 days ago. She reports that this cycle has been heavier than normal and with cramping. She is having to change her pads about every 2-3 hours. She is concerned as typically tomorrow is her heaviest day of her cycle. She denies chest pain, SOB, peripheral edema, does have mild fatigue. She does have a history of Von Willebrand disease.   Past Medical History:  Diagnosis Date   Anxiety    Epiploic appendagitis    MTHFR mutation    V[facility]    Patient Active Problem List   Diagnosis Date Noted   Panic attacks 12/05/2019   Epiploic appendagitis 06/28/2019   Von Willebrand disease 08/18/2016    Past Surgical History:  Procedure Laterality Date   ESOPHAGOGASTRODUODENOSCOPY (EGD) WITH PROPOFOL N/A 01/30/2020   Procedure: ESOPHAGOGASTRODUODENOSCOPY (EGD) WITH PROPOFOL;  Surgeon: Wyline MoodAnna, Kiran, MD;  [facility];  Service: Gastroenterology;  Laterality: N/A;   WISDOM TOOTH EXTRACTION  08/2018   no bleeding complications    OB History     Gravida  1   Para      Term      Preterm      AB  1   Living         SAB  1   IAB      Ectopic      Multiple      Live Births               Home Medications    Prior to Admission medications   Medication Sig Start Date End Date Taking? Authorizing Provider  benzonatate (TESSALON) 100 MG capsule Take 1 capsule (100 mg total) by mouth every 8 (eight) hours. 05/07/21   Rushie Chestnutovington, Zackery Brine M, PA-C  ibuprofen (ADVIL) 800 MG tablet Take 800 mg by mouth 3 (three) times daily. 11/28/20   [provider]  Melatonin 10 MG CAPS Take by mouth.    [provider]  ondansetron  (ZOFRAN) 4 MG tablet Take 1 tablet (4 mg total) by mouth every 6 (six) hours. 05/07/21   Rushie Chestnutovington, Ovida Delagarza M, PA-C    Family History Family History  Problem Relation Age of Onset   Arthritis Maternal Grandmother    Hyperlipidemia Maternal Grandmother    CAD Maternal Grandfather    Cancer Paternal Grandmother        melanoma, sinus cancer   Colon polyps Paternal Grandmother    Esophageal cancer Neg Hx    Stomach cancer Neg Hx    Liver disease Neg Hx     Social History Social History   Tobacco Use   Smoking status: Never   Smokeless tobacco: Never  Vaping Use   Vaping Use: Never used  Substance Use Topics   Alcohol use: No    Alcohol/week: 0.0 standard drinks   Drug use: No     Allergies   Tylenol [acetaminophen] and Depo-medrol [methylprednisolone]   Review of Systems Review of Systems  As stated above in HPI Physical Exam Triage Vital Signs ED Triage Vitals  Enc Vitals Group     BP 05/18/21 1042 138/84     Pulse Rate  05/18/21 1042 84     Resp 05/18/21 1042 16     Temp 05/18/21 1042 98.2 F (36.8 C)     Temp Source 05/18/21 1042 Oral     SpO2 05/18/21 1042 96 %     Weight --      Height --      Head Circumference --      Peak Flow --      Pain Score 05/18/21 1046 3     Pain Loc --      Pain Edu? --      Excl. in GC? --    No data found.  Updated Vital Signs BP 138/84 (BP Location: Left Arm)    Pulse 84    Temp 98.2 F (36.8 C) (Oral)    Resp 16    LMP 05/16/2021    SpO2 96%   Physical Exam Vitals and nursing note reviewed.  Constitutional:      General: She is not in acute distress.    Appearance: Normal appearance. She is not ill-appearing, toxic-appearing or diaphoretic.  HENT:     Head: Normocephalic and atraumatic.  Eyes:     Comments: No pallor  Cardiovascular:     Rate and Rhythm: Normal rate and regular rhythm.     Heart sounds: Normal heart sounds.  Pulmonary:     Effort: Pulmonary effort is normal.     Breath sounds: Normal breath  sounds.  Abdominal:     Palpations: Abdomen is soft.  Musculoskeletal:     Cervical back: Normal range of motion and neck supple.     Right lower leg: No edema.     Left lower leg: No edema.  Lymphadenopathy:     Cervical: No cervical adenopathy.  Skin:    General: Skin is warm.     Coloration: Skin is not pale.  Neurological:     Mental Status: She is alert and oriented to person, place, and time.     UC Treatments / Results  Labs (all labs ordered are listed, but only abnormal results are displayed) Labs Reviewed  POCT URINALYSIS DIP (MANUAL ENTRY) - Abnormal; Notable for the following components:      Result Value   Color, UA other (*)    Spec Grav, UA >=1.030 (*)    Blood, UA large (*)    Protein Ur, POC =30 (*)    Leukocytes, UA Trace (*)    All other components within normal limits  URINE CULTURE  POCT URINE PREGNANCY  CERVICOVAGINAL ANCILLARY ONLY    EKG   Radiology No results found.  Procedures Procedures (including critical care time)  Medications Ordered in UC Medications - No data to display  Initial Impression / Assessment and Plan / UC Course  I have reviewed the triage vital signs and the nursing notes.  Pertinent labs & imaging results that were available during my care of the patient were reviewed by me and considered in my medical decision making (see chart for details).     New.  hCG negative.  Given her history and symptoms we are going to treat Lysteda given that Provera makes her nauseous and flushed.  Discussed how to use.  Discussed hydration with water, iron supplementation and red flag signs and symptoms.  A CBC today.   Final Clinical Impressions(s) / UC Diagnoses   Final diagnoses:  Vaginal bleeding  Abnormal urine   Discharge Instructions   None    ED Prescriptions   None  PDMP not reviewed this encounter.   Rushie Chestnut, [place] 05/18/21 1125

## 2021-05-18 NOTE — ED Triage Notes (Signed)
Pt states she started her period 3 days ago and her bleeding has been heavier than normal. She also has some abdominal cramping which is not normal. She's changing her pad every 2-3 hours.

## 2021-05-18 NOTE — Discharge Instructions (Addendum)
Pick up the Lysteda prescription from the pharmacy.  Hydrate with water Iron supplementation

## 2021-05-19 LAB — CERVICOVAGINAL ANCILLARY ONLY
Bacterial Vaginitis (gardnerella): POSITIVE — AB
Candida Glabrata: NEGATIVE
Candida Vaginitis: NEGATIVE
Chlamydia: NEGATIVE
Comment: NEGATIVE
Comment: NEGATIVE
Comment: NEGATIVE
Comment: NEGATIVE
Comment: NEGATIVE
Comment: NORMAL
Neisseria Gonorrhea: NEGATIVE
Trichomonas: NEGATIVE

## 2021-05-19 LAB — CBC
Hematocrit: 42.5 % (ref 34.0–46.6)
Hemoglobin: 14.1 g/dL (ref 11.1–15.9)
MCH: 27.1 pg (ref 26.6–33.0)
MCHC: 33.2 g/dL (ref 31.5–35.7)
MCV: 82 fL (ref 79–97)
Platelets: 366 10*3/uL (ref 150–450)
RBC: 5.2 x10E6/uL (ref 3.77–5.28)
RDW: 12.6 % (ref 11.7–15.4)
WBC: 9.8 10*3/uL (ref 3.4–10.8)

## 2021-05-20 ENCOUNTER — Telehealth (HOSPITAL_COMMUNITY): Payer: Self-pay

## 2021-05-20 LAB — URINE CULTURE

## 2021-05-20 MED ORDER — METRONIDAZOLE 500 MG PO TABS: 500.0000 mg | ORAL_TABLET | Freq: Two times a day (BID) | ORAL | 0 refills | Status: DC

## 2021-06-25 ENCOUNTER — Ambulatory Visit
Admission: EM | Admit: 2021-06-25 | Discharge: 2021-06-25 | Disposition: A | Discharge status: Home / Self Care | Payer: BC Managed Care – PPO | Attending: Emergency Medicine | Admitting: Emergency Medicine

## 2021-06-25 ENCOUNTER — Encounter: Payer: Self-pay | Admitting: Emergency Medicine

## 2021-06-25 ENCOUNTER — Other Ambulatory Visit: Payer: Self-pay

## 2021-06-25 DIAGNOSIS — K529 Noninfective gastroenteritis and colitis, unspecified: Secondary | ICD-10-CM | POA: Diagnosis not present

## 2021-06-25 DIAGNOSIS — R1030 Lower abdominal pain, unspecified: Secondary | ICD-10-CM | POA: Diagnosis not present

## 2021-06-25 MED ORDER — ONDANSETRON 4 MG PO TBDP
4.0000 mg | ORAL_TABLET | Freq: Once | ORAL | Status: AC
  Administered 2021-06-25: 4 mg via ORAL

## 2021-06-25 MED ORDER — ONDANSETRON 4 MG PO TBDP: 4.0000 mg | ORAL_TABLET | Freq: Three times a day (TID) | ORAL | 0 refills | Status: DC | PRN

## 2021-06-25 NOTE — ED Provider Notes (Signed)
[name]    CSN: [id number] Arrival date & time: 06/25/21  1322      History   Chief Complaint Chief Complaint  Patient presents with   Diarrhea   Nausea   Emesis    HPI Tamara Mcclain is a 21 y.o. female.  Patient presents with 4-day history of nausea.  She reports vomiting and diarrhea today.  1 episode of emesis and 3 episodes of diarrhea today.  She also reports lower abdominal "cramping" today.  No fever, chills, rash, dysuria, hematuria, flank pain, vaginal discharge, pelvic pain, or other symptoms.  No treatments at home.  Her medical history includes infertility and she received Ovidrel on 06/20/2021.  The history is provided by the patient and medical records.   Past Medical History:  Diagnosis Date   Anxiety    Epiploic appendagitis    MTHFR mutation    Von Willebrand disease Dr. Grandville Silos at [facility]    Patient Active Problem List   Diagnosis Date Noted   Panic attacks 12/05/2019   Epiploic appendagitis 06/28/2019   Von Willebrand disease 08/18/2016    Past Surgical History:  Procedure Laterality Date   ESOPHAGOGASTRODUODENOSCOPY (EGD) WITH PROPOFOL N/A 01/30/2020   Procedure: ESOPHAGOGASTRODUODENOSCOPY (EGD) WITH PROPOFOL;  Surgeon: Jonathon Bellows, MD;  Location: [facility] ENDOSCOPY;  Service: Gastroenterology;  Laterality: N/A;   WISDOM TOOTH EXTRACTION  08/2018   no bleeding complications    OB History     Gravida  1   Para      Term      Preterm      AB  1   Living         SAB  1   IAB      Ectopic      Multiple      Live Births               Home Medications    Prior to Admission medications   Medication Sig Start Date End Date Taking? Authorizing Provider  ondansetron (ZOFRAN-ODT) 4 MG disintegrating tablet Take 1 tablet (4 mg total) by mouth every 8 (eight) hours as needed for nausea or vomiting. 06/25/21  Yes Sharion Balloon, NP  benzonatate (TESSALON) 100 MG capsule Take 1 capsule (100 mg total) by mouth every 8  (eight) hours. 05/07/21   Hughie Closs, PA-C  ibuprofen (ADVIL) 800 MG tablet Take 800 mg by mouth 3 (three) times daily. 11/28/20   [provider]  Melatonin 10 MG CAPS Take by mouth.    [provider]  metroNIDAZOLE (FLAGYL) 500 MG tablet Take 1 tablet (500 mg total) by mouth 2 (two) times daily. 05/20/21   LampteyMyrene Galas, MD    Family History Family History  Problem Relation Age of Onset   Arthritis Maternal Grandmother    Hyperlipidemia Maternal Grandmother    CAD Maternal Grandfather    Cancer Paternal Grandmother        melanoma, sinus cancer   Colon polyps Paternal Grandmother    Esophageal cancer Neg Hx    Stomach cancer Neg Hx    Liver disease Neg Hx     Social History Social History   Tobacco Use   Smoking status: Never   Smokeless tobacco: Never  Vaping Use   Vaping Use: Never used  Substance Use Topics   Alcohol use: No    Alcohol/week: 0.0 standard drinks   Drug use: No     Allergies   Tylenol [acetaminophen] and Depo-medrol [  methylprednisolone]   Review of Systems Review of Systems  Respiratory:  Negative for cough and shortness of breath.   Skin:  Negative for color change and rash.  All other systems reviewed and are negative.   Physical Exam Triage Vital Signs ED Triage Vitals  Enc Vitals Group     BP 06/25/21 1342 (!) 136/93     Pulse Rate 06/25/21 1342 92     Resp 06/25/21 1342 16     Temp 06/25/21 1344 98.6 F (37 C)     Temp Source 06/25/21 1344 Oral     SpO2 06/25/21 1342 98 %     Weight --      Height --      Head Circumference --      Peak Flow --      Pain Score --      Pain Loc --      Pain Edu? --      Excl. in [place]? --    No data found.  Updated Vital Signs BP (!) 136/93 (BP Location: Left Arm)    Pulse 92    Temp 98.6 F (37 C) (Oral)    Resp 16    LMP 06/11/2021    SpO2 98%   Visual Acuity Right Eye Distance:   Left Eye Distance:   Bilateral Distance:    Right Eye Near:   Left Eye  Near:    Bilateral Near:     Physical Exam Vitals and nursing note reviewed.  Constitutional:      General: She is not in acute distress.    Appearance: Normal appearance. She is well-developed. She is not ill-appearing.  HENT:     Mouth/Throat:     Mouth: Mucous membranes are moist.  Cardiovascular:     Rate and Rhythm: Normal rate and regular rhythm.     Heart sounds: Normal heart sounds.  Pulmonary:     Effort: Pulmonary effort is normal. No respiratory distress.     Breath sounds: Normal breath sounds.  Abdominal:     General: Bowel sounds are normal. There is no distension.     Palpations: Abdomen is soft.     Tenderness: There is no abdominal tenderness. There is no right CVA tenderness, left CVA tenderness, guarding or rebound.  Musculoskeletal:     Cervical back: Neck supple.  Skin:    General: Skin is warm and dry.  Neurological:     Mental Status: She is alert and oriented to person, place, and time.  Psychiatric:        Mood and Affect: Mood normal.        Behavior: Behavior normal.     UC Treatments / Results  Labs (all labs ordered are listed, but only abnormal results are displayed) Labs Reviewed - No data to display  EKG   Radiology No results found.  Procedures Procedures (including critical care time)  Medications Ordered in UC Medications  ondansetron (ZOFRAN-ODT) disintegrating tablet 4 mg (4 mg Oral Given 06/25/21 1352)    Initial Impression / Assessment and Plan / UC Course  I have reviewed the triage vital signs and the nursing notes.  Pertinent labs & imaging results that were available during my care of the patient were reviewed by me and considered in my medical decision making (see chart for details).    Gastroenteritis, Lower abdominal pain.  Patient appears well hydrated.  Abdomen is soft and nontender.  Zofran given here and patient able to drink water without  emesis.  Treating with Zofran and clear liquid diet.  Discussed diarrhea  diet as tolerated.  ED precautions discussed.  Instructed patient to follow up with her PCP.  She agrees to plan of care.    Final Clinical Impressions(s) / UC Diagnoses   Final diagnoses:  Gastroenteritis  Lower abdominal pain     Discharge Instructions      Take the antinausea medication as directed.    Keep yourself hydrated with clear liquids, such as water and Gatorade.  Follow the attached diarrhea diet as tolerated.   Go to the emergency department if you are unable to keep yourself hydrated or have worsening symptoms.    Follow up with your primary care provider.         ED Prescriptions     Medication Sig Dispense Auth. Provider   ondansetron (ZOFRAN-ODT) 4 MG disintegrating tablet Take 1 tablet (4 mg total) by mouth every 8 (eight) hours as needed for nausea or vomiting. 20 tablet Sharion Balloon, NP      PDMP not reviewed this encounter.   Sharion Balloon, NP 06/25/21 1413

## 2021-06-25 NOTE — ED Provider Notes (Incomplete)
Tamara Mcclain    CSN: [id number] Arrival date & time: 06/25/21  1322      History   Chief Complaint Chief Complaint  Patient presents with   Diarrhea   Nausea   Emesis    HPI Tamara Mcclain is a 21 y.o. female. Patient presents with nausea, vomiting, and diarrhea x 2 days.  She also reports mild abdominal "cramping" in lower abdomen.   1 episode of emesis and 3 episodes of diarrhea today.  She has been able to drink water without emesis.  No fever, chills, dysuria, hematuria, flank pain, vaginal discharge, pelvic pain, or other symptoms.  No treatment at home.  No recent travel or antibiotics.  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    The history is provided by the patient and medical records.   Past Medical History:  Diagnosis Date   Anxiety    Epiploic appendagitis    MTHFR mutation    Von Willebrand disease Dr. Janee Morn at [facility]    Patient Active Problem List   Diagnosis Date Noted   Panic attacks 12/05/2019   Epiploic appendagitis 06/28/2019   Von Willebrand disease 08/18/2016    Past Surgical History:  Procedure Laterality Date   ESOPHAGOGASTRODUODENOSCOPY (EGD) WITH PROPOFOL N/A 01/30/2020   Procedure: ESOPHAGOGASTRODUODENOSCOPY (EGD) WITH PROPOFOL;  Surgeon: Wyline Mood, MD;  Location: [facility] ENDOSCOPY;  Service: Gastroenterology;  Laterality: N/A;   WISDOM TOOTH EXTRACTION  08/2018   no bleeding complications    OB History     Gravida  1   Para      Term      Preterm      AB  1   Living         SAB  1   IAB      Ectopic      Multiple      Live Births               Home Medications    Prior to Admission medications   Medication Sig Start Date End Date Taking? Authorizing Provider  ondansetron (ZOFRAN-ODT) 4 MG disintegrating tablet Take 1 tablet (4 mg total) by mouth every 8 (eight) hours as needed for nausea or vomiting. 06/25/21  Yes Mickie Bail, NP   benzonatate (TESSALON) 100 MG capsule Take 1 capsule (100 mg total) by mouth every 8 (eight) hours. 05/07/21   Rushie Chestnut, PA-C  ibuprofen (ADVIL) 800 MG tablet Take 800 mg by mouth 3 (three) times daily. 11/28/20   [provider]  Melatonin 10 MG CAPS Take by mouth.    [provider]  metroNIDAZOLE (FLAGYL) 500 MG tablet Take 1 tablet (500 mg total) by mouth 2 (two) times daily. 05/20/21   LampteyBritta Mccreedy, MD    Family History Family History  Problem Relation Age of Onset   Arthritis Maternal Grandmother    Hyperlipidemia Maternal Grandmother    CAD Maternal Grandfather    Cancer Paternal Grandmother        melanoma, sinus cancer   Colon polyps Paternal Grandmother    Esophageal cancer Neg Hx    Stomach cancer Neg Hx    Liver disease Neg Hx     Social History Social History   Tobacco Use   Smoking status: Never   Smokeless tobacco: Never  Vaping Use   Vaping Use: Never used  Substance Use Topics   Alcohol use: No    Alcohol/week: 0.0 standard  drinks   Drug use: No     Allergies   Tylenol [acetaminophen] and Depo-medrol [methylprednisolone]   Review of Systems Review of Systems  Constitutional:  Negative for chills and fever.  Respiratory:  Negative for cough and shortness of breath.   Cardiovascular:  Negative for chest pain and palpitations.  Gastrointestinal:  Positive for abdominal pain, diarrhea, nausea and vomiting.  Genitourinary:  Negative for dysuria, flank pain, hematuria, pelvic pain and vaginal discharge.  Skin:  Negative for color change and rash.  All other systems reviewed and are negative.   Physical Exam Triage Vital Signs ED Triage Vitals  Enc Vitals Group     BP      Pulse      Resp      Temp      Temp src      SpO2      Weight      Height      Head Circumference      Peak Flow      Pain Score      Pain Loc      Pain Edu?      Excl. in [place]?    No data found.  Updated Vital Signs BP (!)  136/93 (BP Location: Left Arm)    Pulse 92    Temp 98.6 F (37 C) (Oral)    Resp 16    LMP 06/11/2021    SpO2 98%   Visual Acuity Right Eye Distance:   Left Eye Distance:   Bilateral Distance:    Right Eye Near:   Left Eye Near:    Bilateral Near:     Physical Exam Vitals and nursing note reviewed.  Constitutional:      General: She is not in acute distress.    Appearance: She is well-developed. She is not ill-appearing.  HENT:     Mouth/Throat:     Mouth: Mucous membranes are moist.  Eyes:     Conjunctiva/sclera: Conjunctivae normal.  Cardiovascular:     Rate and Rhythm: Normal rate and regular rhythm.     Heart sounds: Normal heart sounds.  Pulmonary:     Effort: Pulmonary effort is normal. No respiratory distress.     Breath sounds: Normal breath sounds.  Abdominal:     General: Bowel sounds are normal. There is no distension.     Palpations: Abdomen is soft.     Tenderness: There is no abdominal tenderness. There is no right CVA tenderness, left CVA tenderness, guarding or rebound.  Musculoskeletal:     Cervical back: Neck supple.  Skin:    General: Skin is warm and dry.  Neurological:     Mental Status: She is alert.  Psychiatric:        Mood and Affect: Mood normal.        Behavior: Behavior normal.     UC Treatments / Results  Labs (all labs ordered are listed, but only abnormal results are displayed) Labs Reviewed - No data to display  EKG   Radiology No results found.  Procedures Procedures (including critical care time)  Medications Ordered in UC Medications  ondansetron (ZOFRAN-ODT) disintegrating tablet 4 mg (4 mg Oral Given 06/25/21 1352)    Initial Impression / Assessment and Plan / UC Course  I have reviewed the triage vital signs and the nursing notes.  Pertinent labs & imaging results that were available during my care of the patient were reviewed by me and considered in my medical decision making (  see chart for details).     Gastroenteritis.   Final Clinical Impressions(s) / UC Diagnoses   Final diagnoses:  Gastroenteritis  Lower abdominal pain     Discharge Instructions      Take the antinausea medication as directed.    Keep yourself hydrated with clear liquids, such as water and Gatorade.  Follow the attached diarrhea diet as tolerated.   Go to the emergency department if you are unable to keep yourself hydrated or have worsening symptoms.    Follow up with your primary care provider.          ED Prescriptions     Medication Sig Dispense Auth. Provider   ondansetron (ZOFRAN-ODT) 4 MG disintegrating tablet Take 1 tablet (4 mg total) by mouth every 8 (eight) hours as needed for nausea or vomiting. 20 tablet Sharion Balloon, NP      PDMP not reviewed this encounter.

## 2021-06-25 NOTE — ED Triage Notes (Signed)
Pt presents with nausea x 4 days and vomiting and diarrhea that started today. She is also complaining of abdominal cramping that started today.

## 2021-06-25 NOTE — Discharge Instructions (Addendum)
Take the antinausea medication as directed.    Keep yourself hydrated with clear liquids, such as water and Gatorade.  Follow the attached diarrhea diet as tolerated.   Go to the emergency department if you are unable to keep yourself hydrated or have worsening symptoms.    Follow up with your primary care provider.

## 2021-07-08 ENCOUNTER — Other Ambulatory Visit: Payer: Self-pay

## 2021-07-08 ENCOUNTER — Emergency Department
Admission: EM | Admit: 2021-07-08 | Discharge: 2021-07-08 | Disposition: A | Discharge status: Home / Self Care | Payer: BC Managed Care – PPO | Attending: Physician Assistant | Admitting: Physician Assistant

## 2021-07-08 ENCOUNTER — Encounter: Payer: Self-pay | Admitting: *Deleted

## 2021-07-08 DIAGNOSIS — N939 Abnormal uterine and vaginal bleeding, unspecified: Secondary | ICD-10-CM | POA: Diagnosis not present

## 2021-07-08 DIAGNOSIS — Z5321 Procedure and treatment not carried out due to patient leaving prior to being seen by health care provider: Secondary | ICD-10-CM | POA: Insufficient documentation

## 2021-07-08 DIAGNOSIS — R102 Pelvic and perineal pain: Secondary | ICD-10-CM | POA: Diagnosis not present

## 2021-07-08 LAB — CBC WITH DIFFERENTIAL/PLATELET
Abs Immature Granulocytes: 0.03 10*3/uL (ref 0.00–0.07)
Basophils Absolute: 0.1 10*3/uL (ref 0.0–0.1)
Basophils Relative: 1 %
Eosinophils Absolute: 0.2 10*3/uL (ref 0.0–0.5)
Eosinophils Relative: 1 %
HCT: 42.1 % (ref 36.0–46.0)
Hemoglobin: 13.8 g/dL (ref 12.0–15.0)
Immature Granulocytes: 0 %
Lymphocytes Relative: 23 %
Lymphs Abs: 2.9 10*3/uL (ref 0.7–4.0)
MCH: 27 pg (ref 26.0–34.0)
MCHC: 32.8 g/dL (ref 30.0–36.0)
MCV: 82.2 fL (ref 80.0–100.0)
Monocytes Absolute: 0.8 10*3/uL (ref 0.1–1.0)
Monocytes Relative: 6 %
Neutro Abs: 8.9 10*3/uL — ABNORMAL HIGH (ref 1.7–7.7)
Neutrophils Relative %: 69 %
Platelets: 394 10*3/uL (ref 150–400)
RBC: 5.12 MIL/uL — ABNORMAL HIGH (ref 3.87–5.11)
RDW: 12.4 % (ref 11.5–15.5)
WBC: 12.9 10*3/uL — ABNORMAL HIGH (ref 4.0–10.5)
nRBC: 0 % (ref 0.0–0.2)

## 2021-07-08 LAB — ABO/RH: ABO/RH(D): A POS

## 2021-07-08 LAB — BASIC METABOLIC PANEL
Anion gap: 11 (ref 5–15)
BUN: 10 mg/dL (ref 6–20)
CO2: 25 mmol/L (ref 22–32)
Calcium: 9.6 mg/dL (ref 8.9–10.3)
Chloride: 104 mmol/L (ref 98–111)
Creatinine, Ser: 0.75 mg/dL (ref 0.44–1.00)
GFR, Estimated: >60 mL/min (ref >60)
Glucose, Bld: 98 mg/dL (ref 70–99)
Potassium: 3.7 mmol/L (ref 3.5–5.1)
Sodium: 140 mmol/L (ref 135–145)

## 2021-07-08 LAB — HCG, QUANTITATIVE, PREGNANCY: hCG, Beta Chain, Quant, S: <1 m[IU]/mL (ref <5)

## 2021-07-08 NOTE — ED Provider Triage Note (Signed)
Emergency Medicine Provider Triage Evaluation Note  Tamara Mcclain , a 21 y.o. female  was evaluated in triage.  Pt complains of pelvic pain and heavy vaginal bleeding.  Patient is undergoing infertility treatments..  Review of Systems  Positive: Vaginal pain, vaginal bleeding Negative: Fever chills  Physical Exam  BP (!) 139/94    Pulse 93    Temp 98.6 F (37 C) (Oral)    Resp 16    LMP 06/11/2021    SpO2 98%  Gen:   Awake, no distress   Resp:  Normal effort  MSK:   Moves extremities without difficulty  Other:    Medical Decision Making  Medically screening exam initiated at 5:14 PM.  Appropriate orders placed.  JAGGER BEAHM was informed that the remainder of the evaluation will be completed by another provider, this initial triage assessment does not replace that evaluation, and the importance of remaining in the ED until their evaluation is complete.     Faythe Ghee, PA-C 07/08/21 1715

## 2021-07-08 NOTE — ED Notes (Signed)
Pt up to registration desk pt states that she is leaving due to the wait time.

## 2021-07-08 NOTE — ED Notes (Signed)
Pt unable to void at this time. 

## 2021-07-08 NOTE — ED Triage Notes (Signed)
Pt has low abd pain since last night.  Menses now.  Pt reports heavy bleeding today.  No urinary sx.  Pt alert  speech clear.

## 2021-07-24 ENCOUNTER — Other Ambulatory Visit: Payer: Self-pay

## 2021-07-24 ENCOUNTER — Emergency Department (HOSPITAL_BASED_OUTPATIENT_CLINIC_OR_DEPARTMENT_OTHER)
Admission: EM | Admit: 2021-07-24 | Discharge: 2021-07-24 | Disposition: A | Discharge status: Home / Self Care | Payer: BC Managed Care – PPO | Attending: Emergency Medicine | Admitting: Emergency Medicine

## 2021-07-24 ENCOUNTER — Ambulatory Visit: Payer: Self-pay | Admitting: *Deleted

## 2021-07-24 ENCOUNTER — Encounter (HOSPITAL_BASED_OUTPATIENT_CLINIC_OR_DEPARTMENT_OTHER): Payer: Self-pay

## 2021-07-24 ENCOUNTER — Emergency Department (HOSPITAL_BASED_OUTPATIENT_CLINIC_OR_DEPARTMENT_OTHER): Payer: BC Managed Care – PPO | Admitting: Radiology

## 2021-07-24 DIAGNOSIS — R059 Cough, unspecified: Secondary | ICD-10-CM | POA: Diagnosis not present

## 2021-07-24 DIAGNOSIS — Z20822 Contact with and (suspected) exposure to covid-19: Secondary | ICD-10-CM | POA: Diagnosis not present

## 2021-07-24 DIAGNOSIS — R0602 Shortness of breath: Secondary | ICD-10-CM | POA: Diagnosis present

## 2021-07-24 DIAGNOSIS — R000 Tachycardia, unspecified: Secondary | ICD-10-CM | POA: Diagnosis not present

## 2021-07-24 LAB — PREGNANCY, URINE: Preg Test, Ur: NEGATIVE

## 2021-07-24 LAB — CBC WITH DIFFERENTIAL/PLATELET
Abs Immature Granulocytes: 0.02 10*3/uL (ref 0.00–0.07)
Basophils Absolute: 0 10*3/uL (ref 0.0–0.1)
Basophils Relative: 0 %
Eosinophils Absolute: 0.2 10*3/uL (ref 0.0–0.5)
Eosinophils Relative: 3 %
HCT: 41.2 % (ref 36.0–46.0)
Hemoglobin: 13.6 g/dL (ref 12.0–15.0)
Immature Granulocytes: 0 %
Lymphocytes Relative: 22 %
Lymphs Abs: 1.3 10*3/uL (ref 0.7–4.0)
MCH: 26.9 pg (ref 26.0–34.0)
MCHC: 33 g/dL (ref 30.0–36.0)
MCV: 81.6 fL (ref 80.0–100.0)
Monocytes Absolute: 0.6 10*3/uL (ref 0.1–1.0)
Monocytes Relative: 10 %
Neutro Abs: 3.9 10*3/uL (ref 1.7–7.7)
Neutrophils Relative %: 65 %
Platelets: 318 10*3/uL (ref 150–400)
RBC: 5.05 MIL/uL (ref 3.87–5.11)
RDW: 13 % (ref 11.5–15.5)
WBC: 6 10*3/uL (ref 4.0–10.5)
nRBC: 0 % (ref 0.0–0.2)

## 2021-07-24 LAB — COMPREHENSIVE METABOLIC PANEL
ALT: 15 U/L (ref 0–44)
AST: 17 U/L (ref 15–41)
Albumin: 4 g/dL (ref 3.5–5.0)
Alkaline Phosphatase: 75 U/L (ref 38–126)
Anion gap: 8 (ref 5–15)
BUN: 9 mg/dL (ref 6–20)
CO2: 27 mmol/L (ref 22–32)
Calcium: 9.5 mg/dL (ref 8.9–10.3)
Chloride: 104 mmol/L (ref 98–111)
Creatinine, Ser: 0.7 mg/dL (ref 0.44–1.00)
GFR, Estimated: >60 mL/min (ref >60)
Glucose, Bld: 83 mg/dL (ref 70–99)
Potassium: 3.6 mmol/L (ref 3.5–5.1)
Sodium: 139 mmol/L (ref 135–145)
Total Bilirubin: 0.9 mg/dL (ref 0.3–1.2)
Total Protein: 7.3 g/dL (ref 6.5–8.1)

## 2021-07-24 LAB — RESP PANEL BY RT-PCR (FLU A&B, COVID) ARPGX2
Influenza A by PCR: NEGATIVE
Influenza B by PCR: NEGATIVE
SARS Coronavirus 2 by RT PCR: NEGATIVE

## 2021-07-24 LAB — TROPONIN I (HIGH SENSITIVITY): Troponin I (High Sensitivity): <2 ng/L (ref <18)

## 2021-07-24 LAB — D-DIMER, QUANTITATIVE: D-Dimer, Quant: 0.37 ug/mL-FEU (ref 0.00–0.50)

## 2021-07-24 MED ORDER — ALBUTEROL SULFATE HFA 108 (90 BASE) MCG/ACT IN AERS: 1.0000 | INHALATION_SPRAY | Freq: Four times a day (QID) | RESPIRATORY_TRACT | 0 refills | Status: DC | PRN

## 2021-07-24 MED ORDER — ALBUTEROL SULFATE HFA 108 (90 BASE) MCG/ACT IN AERS
2.0000 | INHALATION_SPRAY | RESPIRATORY_TRACT | Status: DC | PRN
  Administered 2021-07-24: 2 via RESPIRATORY_TRACT
  Filled 2021-07-24: qty 6.7

## 2021-07-24 NOTE — Discharge Instructions (Addendum)
Like we discussed, you can continue to use your albuterol inhaler as prescribed for shortness of breath and wheezing. ? ?Please follow-up with your primary care provider regarding your symptoms as well as this visit today.  If you develop any new or worsening symptoms please come back to the emergency department. ?

## 2021-07-24 NOTE — Telephone Encounter (Addendum)
?  Chief Complaint: wheezing with activity ?Symptoms: stuffed nose/runny nose ?Frequency: nasal all the time, wheezing with activity ?Pertinent Negatives: Patient denies fever ?Disposition: [] ED /[x] Urgent Care (no appt availability in office) / [x] Appointment(In office/virtual)/ []  [place] [facility]/ [] Home Care/ [] Refused Recommended Disposition /[] [place] [facility]/ []  Follow-up with PCP ?Additional Notes: First appt in practice, virtual or in person is tomorrow. Made appt, put on wait list, advised pt to go to UC if worsens or if gets a fever. Pt states she is going to a job interview now then she will go to UC. Will keep appt tomorrow as follow up. ? ?Reason for Disposition ? [1] MODERATE longstanding difficulty breathing (e.g., speaks in phrases, SOB even at rest, pulse 100-120) AND [2] SAME as normal ? ?Answer Assessment - Initial Assessment Questions ?1. RESPIRATORY STATUS: "Describe your breathing?" (e.g., wheezing, shortness of breath, unable to speak, severe coughing)  ?    Wheezing SOB ?2. ONSET: "When did this breathing problem begin?"  ?    Last night really, started during afternoon, thought allergies ?3. PATTERN "Does the difficult breathing come and go, or has it been constant since it started?"  ?    With any activity ?4. SEVERITY: "How bad is your breathing?" (e.g., mild, moderate, severe)  ?  - MILD: No SOB at rest, mild SOB with walking, speaks normally in sentences, can lie down, no retractions, pulse < 100.  ?  - MODERATE: SOB at rest, SOB with minimal exertion and prefers to sit, cannot lie down flat, speaks in phrases, mild retractions, audible wheezing, pulse 100-120.  ?  - SEVERE: Very SOB at rest, speaks in single words, struggling to breathe, sitting hunched forward, retractions, pulse > 120  ?    5 ?5. RECURRENT SYMPTOM: "Have you had difficulty breathing before?" If Yes, ask: "When was the last time?" and "What happened that time?"  ?    Any activity ? ?8. CAUSE: "What do  you think is causing the breathing problem?"  ?    unknown ?9. OTHER SYMPTOMS: "Do you have any other symptoms? (e.g., dizziness, runny nose, cough, chest pain, fever) ?    Runny nose yesterday, stuffed, no fever or cough ? ?11. PREGNANCY: "Is there any chance you are pregnant?" "When was your last menstrual period?" ?      LMP 07/06/21 ?12. TRAVEL: "Have you traveled out of the country in the last month?" (e.g., travel history, exposures) ?      no ? ?Protocols used: Breathing Difficulty-A-AH ? ?

## 2021-07-24 NOTE — ED Provider Notes (Signed)
?[facility] EMERGENCY DEPT ?Provider Note ? ? ?CSN: [id number] ?Arrival date & time: 07/24/21  1340 ? ?  ? ?History ? ?Chief Complaint  ?Patient presents with  ? Cough  ? Shortness of Breath  ? ? ?Elyse JarvisKatelyn E Thomas is a 21 y.o. female. ? ?HPI ?Patient is a 21 year old female who presents to the emergency department due to nasal congestion that started yesterday.  Patient states that her symptoms began yesterday evening.  She went to bed and woke up in the middle the night with wheezing and constant shortness of breath that worsens with exertion.  Denies any coughing or sore throat.  She states that this morning she then began developing intermittent central chest pain.  Describes it as an aching sensation.  No modifying factors.  Also notes subjective fevers as well as nausea.  Denies any current nausea.  No vomiting.  No abdominal pain. ?  ? ?Home Medications ?Prior to Admission medications   ?Medication Sig Start Date End Date Taking? Authorizing Provider  ?albuterol (VENTOLIN HFA) 108 (90 Base) MCG/ACT inhaler Inhale 1-2 puffs into the lungs every 6 (six) hours as needed for wheezing or shortness of breath. 07/24/21  Yes Placido SouJoldersma, Yamin Swingler, PA-C  ?letrozole (FEMARA) 2.5 MG tablet Take 2.5 mg by mouth See admin instructions. Take 3rd day after start of period until 7th day 07/08/21  Yes [provider]  ?benzonatate (TESSALON) 100 MG capsule Take 1 capsule (100 mg total) by mouth every 8 (eight) hours. ?Patient not taking: Reported on 07/24/2021 05/07/21   Rushie Chestnutovington, Sarah M, PA-C  ?ibuprofen (ADVIL) 800 MG tablet Take 800 mg by mouth 3 (three) times daily. 11/28/20   [provider]  ?Melatonin 10 MG CAPS Take by mouth.    [provider]  ?metroNIDAZOLE (FLAGYL) 500 MG tablet Take 1 tablet (500 mg total) by mouth 2 (two) times daily. ?Patient not taking: Reported on 07/24/2021 05/20/21   Merrilee JanskyLamptey, Philip O, MD  ?ondansetron (ZOFRAN-ODT) 4 MG disintegrating tablet Take 1 tablet (4 mg  total) by mouth every 8 (eight) hours as needed for nausea or vomiting. 06/25/21   Mickie Bailate, Kelly H, NP  ?   ? ?Allergies    ?Tylenol [acetaminophen] and Depo-medrol [methylprednisolone]   ? ?Review of Systems   ?Review of Systems  ?All other systems reviewed and are negative. ?Ten systems reviewed and are negative for acute change, except as noted in the HPI.   ?Physical Exam ?Updated Vital Signs ?BP (!) 133/98   Pulse (!) 104   Temp 98.2 ?F (36.8 ?C)   Resp 17   Ht 5\' 1"  (1.549 m)   Wt 90.7 kg   LMP 07/08/2021   SpO2 98%   BMI 37.79 kg/m?  ?Physical Exam ?Vitals and nursing note reviewed.  ?Constitutional:   ?   General: She is not in acute distress. ?   Appearance: Normal appearance. She is well-developed. She is not ill-appearing, toxic-appearing or diaphoretic.  ?   Interventions: She is not intubated. ?HENT:  ?   Head: Normocephalic and atraumatic.  ?   Right Ear: External ear normal.  ?   Left Ear: External ear normal.  ?   Nose: Nose normal.  ?   Mouth/Throat:  ?   Mouth: Mucous membranes are moist.  ?   Pharynx: Oropharynx is clear. No oropharyngeal exudate or posterior oropharyngeal erythema.  ?Eyes:  ?   Extraocular Movements: Extraocular movements intact.  ?Cardiovascular:  ?   Rate and Rhythm: Regular rhythm. Tachycardia present.  ?  Pulses: Normal pulses.  ?   Heart sounds: Normal heart sounds. No murmur heard. ?  No friction rub. No gallop.  ?Pulmonary:  ?   Effort: Pulmonary effort is normal. No tachypnea, bradypnea, accessory muscle usage or respiratory distress. She is not intubated.  ?   Breath sounds: Normal breath sounds. No stridor. No decreased breath sounds, wheezing, rhonchi or rales.  ?   Comments: LCTAB without wheezing, rales, or rhonchi ?Chest:  ?   Chest wall: No tenderness.  ?Abdominal:  ?   General: Abdomen is flat.  ?   Palpations: Abdomen is soft.  ?   Tenderness: There is no abdominal tenderness.  ?   Comments: Abdomen is soft and nontender.  ?Musculoskeletal:     ?   General:  Normal range of motion.  ?   Cervical back: Normal range of motion and neck supple. No tenderness.  ?   Right lower leg: No edema.  ?   Left lower leg: No edema.  ?   Comments: No pedal edema.  2+ DP pulses.  ?Skin: ?   General: Skin is warm and dry.  ?Neurological:  ?   General: No focal deficit present.  ?   Mental Status: She is alert and oriented to person, place, and time.  ?Psychiatric:     ?   Mood and Affect: Mood normal.     ?   Behavior: Behavior normal.  ? ?ED Results / Procedures / Treatments   ?Labs ?(all labs ordered are listed, but only abnormal results are displayed) ?Labs Reviewed  ?RESP PANEL BY RT-PCR (FLU A&B, COVID) ARPGX2  ?COMPREHENSIVE METABOLIC PANEL  ?CBC WITH DIFFERENTIAL/PLATELET  ?PREGNANCY, URINE  ?D-DIMER, QUANTITATIVE  ?TROPONIN I (HIGH SENSITIVITY)  ?TROPONIN I (HIGH SENSITIVITY)  ? ?EKG ?None ? ?Radiology ?DG Chest 2 View ? ?Result Date: 07/24/2021 ?CLINICAL DATA:  Shortness of breath with chest pain and cough since last night. EXAM: CHEST - 2 VIEW COMPARISON:  Radiographs 02/13/2018. FINDINGS: Suboptimal inspiration, specially on the lateral view. The heart size and mediastinal contours are normal. The lungs are clear. There is no pleural effusion or pneumothorax. No acute osseous findings are identified. IMPRESSION: Suboptimal inspiration. No evidence of active cardiopulmonary process. Electronically Signed   By: Carey Bullocks M.D.   On: 07/24/2021 14:26   ? ?Procedures ?Procedures  ? ?Medications Ordered in ED ?Medications  ?albuterol (VENTOLIN HFA) 108 (90 Base) MCG/ACT inhaler 2 puff (2 puffs Inhalation Given 07/24/21 1829)  ? ? ?ED Course/ Medical Decision Making/ A&P ?  ?                        ?Medical Decision Making ?Amount and/or Complexity of Data Reviewed ?Labs: ordered. ?Radiology: ordered. ? ?Risk ?Prescription drug management. ? ?Pt is a 21 y.o. female who presents to the emergency department due to rhinorrhea, congestion, shortness of breath. ? ?Labs: ?CBC, CMP  without abnormalities. ?Respiratory panel is negative. ?Troponin less than 2. ?Urine pregnancy is negative. ?D-dimer within normal limits at 0.37 ? ?Imaging: ?Chest x-ray shows suboptimal inspiration but no evidence of active cardiopulmonary process. ? ?I, Placido Sou, PA-C, personally reviewed and evaluated these images and lab results as part of my medical decision-making. ? ?Unsure the source of the patient's symptoms.  She was tachycardic around 110 bpm on my initial exam.  No murmurs, rubs, or gallops.  Lungs are clear to auscultation bilaterally without wheezing, rales, or rhonchi.  Abdomen is soft and nontender.  Symptoms appear to likely be viral so a respiratory panel was obtained which is negative.  ECG showed sinus tachycardia with nonspecific T wave abnormalities.  She also notes intermittent chest pain that started this morning so single troponin was obtained which is less than 2.  I also obtained a D-dimer which was within normal limits at 0.37.  Given her age, lack of risk factors, as well as her reassuring work-up, doubt ACS at this time.  D-dimer appears reassuring, doubt DVT/PE at this time. ? ?Patient was given a dose of albuterol and notes significant improvement in her symptoms.  We will send her home with the inhaler as well as a spacer.  We will give a prescription for an additional albuterol inhaler.  She states that she also has a follow-up appointment with her PCP tomorrow.  I urged her to discuss her symptoms as well as this visit further. ? ?Patient appears stable for discharge at this time and she is agreeable.  We discussed return precautions.  Her questions were answered and she was amicable at the time of discharge. ? ?Note: Portions of this report may have been transcribed using voice recognition software. Every effort was made to ensure accuracy; however, inadvertent computerized transcription errors may be present.  ? ?Final Clinical Impression(s) / ED Diagnoses ?Final  diagnoses:  ?SOB (shortness of breath)  ? ?Rx / DC Orders ?ED Discharge Orders   ? ?      Ordered  ?  albuterol (VENTOLIN HFA) 108 (90 Base) MCG/ACT inhaler  Every 6 hours PRN       ? 07/24/21 1912  ? ?  ?  ? ?  ? ?  ?

## 2021-07-24 NOTE — Telephone Encounter (Signed)
Reason for Disposition . [1] MILD difficulty breathing (e.g., minimal/no SOB at rest, SOB with walking, pulse <100) AND [2] NEW-onset or WORSE than normal  Protocols used: Breathing Difficulty-A-AH  

## 2021-07-24 NOTE — Telephone Encounter (Signed)
Will discuss at upcoming appt.

## 2021-07-24 NOTE — ED Triage Notes (Signed)
Pt presents with nasal congestion x1 day, woke up at midnight with wheezing, constant SOB that worsens with exertion and chest soreness. Pt denies a fever but reports she feels hot to touch.   ? ?Pt denies coughing, recent travel or sick family members.  ?

## 2021-07-24 NOTE — ED Notes (Signed)
RT educated pt on proper use of MDI w/spacer. Pt also informed RT that she has never had an actual diagnosis of asthma. RT gave pt information on Pulmonologist for PFT for evaluation for her breathing difficulties. Pt thankful and states she will get a referral for further evaluation.  ?

## 2021-07-25 ENCOUNTER — Encounter: Payer: Self-pay | Admitting: Family Medicine

## 2021-07-25 ENCOUNTER — Telehealth (INDEPENDENT_AMBULATORY_CARE_PROVIDER_SITE_OTHER): Payer: BC Managed Care – PPO | Admitting: Family Medicine

## 2021-07-25 VITALS — Ht 61.0 in | Wt 200.0 lb

## 2021-07-25 DIAGNOSIS — J45901 Unspecified asthma with (acute) exacerbation: Secondary | ICD-10-CM | POA: Diagnosis not present

## 2021-07-25 DIAGNOSIS — B349 Viral infection, unspecified: Secondary | ICD-10-CM

## 2021-07-25 DIAGNOSIS — J9801 Acute bronchospasm: Secondary | ICD-10-CM | POA: Diagnosis not present

## 2021-07-25 MED ORDER — PREDNISONE 10 MG PO TABS: ORAL_TABLET | ORAL | 0 refills | Status: DC

## 2021-07-25 NOTE — Progress Notes (Signed)
? ?Subjective:  ? ? Patient ID: Tamara Mcclain, female    DOB: 2000/06/11, 21 y.o.   MRN: [id number] ? ?Tamara Mcclain is a 21 y.o. female presenting on 07/25/2021 for Shortness of Breath and Wheezing ? ?Virtual / Telehealth Encounter - Video Visit via MyChart ?The purpose of this virtual visit is to provide medical care while limiting exposure to the novel coronavirus (COVID19) for both patient and office staff. ? ?Consent was obtained for remote visit:  Yes.   ?Answered questions that patient had about telehealth interaction:  Yes.   ?I discussed the limitations, risks, security and privacy concerns of performing an evaluation and management service by video/telephone. I also discussed with the patient that there may be a patient responsible charge related to this service. The patient expressed understanding and agreed to proceed. ? ?Patient Location: Home ?Provider Location: Tamara Mcclain (Office) ? ?Participants in virtual visit: ?- Patient: Tamara Mcclain ?- CMA: Tamara Mcclain, CMA ?- Provider: Dr Althea Mcclain  ? ?HPI ? ?Acute Viral Syndrome ?Bronchospasm ?History of wheezing before but no asthma previously diagnosed. Recent acute onset viral symptom constellation cough congestion  ?She was seen by ED yesterday, testing for virus with negative for COVID Flu, she had X-ray done as well, see results ?Given Albuterol with relief ?Rx was given for her Albuterol inhaler ?They asked her to see Allergist ? ?Depression screen [facility] 2/9 10/01/2020 10/24/2019 03/31/2018  ?Decreased Interest 0 0 0  ?Down, Depressed, Hopeless 0 0 0  ?PHQ - 2 Score 0 0 0  ? ? ?Social History  ? ?Tobacco Use  ?? Smoking status: Never  ?? Smokeless tobacco: Never  ?Vaping Use  ?? Vaping Use: Never used  ?Substance Use Topics  ?? Alcohol use: No  ?  Alcohol/week: 0.0 standard drinks  ?? Drug use: No  ? ? ?Review of Systems ?Per HPI unless specifically indicated above ? ?   ?Objective:  ?  ?Ht 5\' 1"  (1.549 m)   Wt 200 lb (90.7  kg)   LMP 07/08/2021   BMI 37.79 kg/m?   ?Wt Readings from Last 3 Encounters:  ?07/25/21 200 lb (90.7 kg)  ?07/24/21 200 lb (90.7 kg)  ?04/21/21 195 lb (88.5 kg)  ?  ?Physical Exam ? ?Note examination was completely remotely via video observation objective data only ? ?Gen - well-appearing, no acute distress or apparent pain, comfortable ?HEENT - eyes appear clear without discharge or redness ?Heart/Lungs - cannot examine virtually - observed no evidence of coughing or labored breathing. ?Abd - cannot examine virtually  ?Skin - face visible today- no rash ?Neuro - awake, alert, oriented ?Psych - not anxious appearing ? ?Results for orders placed or performed during the hospital encounter of 07/24/21  ?Resp Panel by RT-PCR (Flu A&B, Covid) Nasopharyngeal Swab  ? Specimen: Nasopharyngeal Swab; Nasopharyngeal(NP) swabs in vial transport medium  ?Result Value Ref Range  ? SARS Coronavirus 2 by RT PCR NEGATIVE NEGATIVE  ? Influenza A by PCR NEGATIVE NEGATIVE  ? Influenza B by PCR NEGATIVE NEGATIVE  ?Comprehensive metabolic panel  ?Result Value Ref Range  ? Sodium 139 135 - 145 mmol/L  ? Potassium 3.6 3.5 - 5.1 mmol/L  ? Chloride 104 98 - 111 mmol/L  ? CO2 27 22 - 32 mmol/L  ? Glucose, Bld 83 70 - 99 mg/dL  ? BUN 9 6 - 20 mg/dL  ? Creatinine, Ser 0.70 0.44 - 1.00 mg/dL  ? Calcium 9.5 8.9 - 10.3 mg/dL  ?  Total Protein 7.3 6.5 - 8.1 g/dL  ? Albumin 4.0 3.5 - 5.0 g/dL  ? AST 17 15 - 41 U/L  ? ALT 15 0 - 44 U/L  ? Alkaline Phosphatase 75 38 - 126 U/L  ? Total Bilirubin 0.9 0.3 - 1.2 mg/dL  ? GFR, Estimated >60 >60 mL/min  ? Anion gap 8 5 - 15  ?CBC with Differential  ?Result Value Ref Range  ? WBC 6.0 4.0 - 10.5 K/uL  ? RBC 5.05 3.87 - 5.11 MIL/uL  ? Hemoglobin 13.6 12.0 - 15.0 g/dL  ? HCT 41.2 36.0 - 46.0 %  ? MCV 81.6 80.0 - 100.0 fL  ? MCH 26.9 26.0 - 34.0 pg  ? MCHC 33.0 30.0 - 36.0 g/dL  ? RDW 13.0 11.5 - 15.5 %  ? Platelets 318 150 - 400 K/uL  ? nRBC 0.0 0.0 - 0.2 %  ? Neutrophils Relative % 65 %  ? Neutro Abs 3.9 1.7  - 7.7 K/uL  ? Lymphocytes Relative 22 %  ? Lymphs Abs 1.3 0.7 - 4.0 K/uL  ? Monocytes Relative 10 %  ? Monocytes Absolute 0.6 0.1 - 1.0 K/uL  ? Eosinophils Relative 3 %  ? Eosinophils Absolute 0.2 0.0 - 0.5 K/uL  ? Basophils Relative 0 %  ? Basophils Absolute 0.0 0.0 - 0.1 K/uL  ? Immature Granulocytes 0 %  ? Abs Immature Granulocytes 0.02 0.00 - 0.07 K/uL  ?Pregnancy, urine  ?Result Value Ref Range  ? Preg Test, Ur NEGATIVE NEGATIVE  ?D-dimer, quantitative  ?Result Value Ref Range  ? D-Dimer, Quant 0.37 0.00 - 0.50 ug/mL-FEU  ?Troponin I (High Sensitivity)  ?Result Value Ref Range  ? Troponin I (High Sensitivity) <2 <18 ng/L  ? ?I have personally reviewed the radiology report from 07/24/21 CXR. ? ?CLINICAL DATA:  Shortness of breath with chest pain and cough since ?last night. ?  ?EXAM: ?CHEST - 2 VIEW ?  ?COMPARISON:  Radiographs 02/13/2018. ?  ?FINDINGS: ?Suboptimal inspiration, specially on the lateral view. The heart ?size and mediastinal contours are normal. The lungs are clear. There ?is no pleural effusion or pneumothorax. No acute osseous findings ?are identified. ?  ?IMPRESSION: ?Suboptimal inspiration. No evidence of active cardiopulmonary ?process. ?  ?  ?Electronically Signed ?  By: Tamara Mcclain M.D. ?  On: 07/24/2021 14:26 ? ?   ?Assessment & Plan:  ? ?Problem List Items Addressed This Visit   ?None ?Visit Diagnoses   ? ? Acute viral syndrome    -  Primary  ? Relevant Medications  ? predniSONE (DELTASONE) 10 MG tablet  ? Bronchospasm, acute      ? Relevant Medications  ? predniSONE (DELTASONE) 10 MG tablet  ? Other Relevant Orders  ? Ambulatory referral to Allergy  ? Moderate asthma with acute exacerbation, unspecified whether persistent      ? Relevant Medications  ? predniSONE (DELTASONE) 10 MG tablet  ? Other Relevant Orders  ? Ambulatory referral to Allergy  ? ?  ?  ?Possible new onset dx Asthma, with exacerbation ?Bronchospasm ?Recent acute viral illness likely trigger ?ED visit reviewed see  above ?Viral testing and CXR negative ?Will treat with Prednisone taper over 5 days ?Use albuterol ?OTC medicines for symptoms ?Referral to Asthma/Allergy [place] ? ?Orders Placed This Encounter  ?Procedures  ?? Ambulatory referral to Allergy  ?  Referral Priority:   Routine  ?  Referral Type:   Allergy Testing  ?  Referral Reason:   Specialty Services Required  ?  Requested Specialty:   Allergy  ?  Number of Visits Requested:   1  ? ? ? ?Meds ordered this encounter  ?Medications  ?? predniSONE (DELTASONE) 10 MG tablet  ?  Sig: Take 5 tabs with breakfast Day 1, 4 tabs Day 2, 3 tabs Day 3, 2 tabs Day 4, 1 tabs Day 5  ?  Dispense:  15 tablet  ?  Refill:  0  ? ? ? ? ?Follow up plan: ?Return if symptoms worsen or fail to improve. ? ? ?Patient verbalizes understanding with the above medical recommendations including the limitation of remote medical advice. ? ?Specific follow-up and call-back criteria were given for patient to follow-up or seek medical care more urgently if needed. ? ?Total duration of direct patient care provided via video conference: 10 minutes ? ? ?Saralyn PilarAlexander Naira Standiford, DO ?[facility] ?[facility] ?07/25/2021, 10:29 AM ?

## 2021-07-25 NOTE — Patient Instructions (Addendum)
Thank you for coming to the office today. ? ?[facility] ?[place] [facility] ?[address] ?[address] ?[place], [place]  [address] ?Phone: [phone] ? ?Start Prednisone taper ?Use albuterol inhaler as needed ?OTC cough medicine Sudafed, Mucinex, Tylenol ? ?If severe worsening symptoms difficulty breathing, please seek care at hospital ED if need ? ?Please schedule a Follow-up Appointment to: Return if symptoms worsen or fail to improve. ? ?If you have any other questions or concerns, please feel free to call the office or send a message through MyChart. You may also schedule an earlier appointment if necessary. ? ?Additionally, you may be receiving a survey about your experience at our office within a few days to 1 week by e-mail or mail. We value your feedback. ? ?Saralyn Pilar, DO ?[facility], [place] ?

## 2021-07-28 ENCOUNTER — Ambulatory Visit: Payer: BC Managed Care – PPO | Admitting: Pulmonary Disease

## 2021-07-28 ENCOUNTER — Other Ambulatory Visit: Payer: Self-pay

## 2021-07-28 ENCOUNTER — Encounter: Payer: Self-pay | Admitting: Pulmonary Disease

## 2021-07-28 VITALS — BP 124/78 | HR 94 | Temp 98.1°F | Ht 60.0 in | Wt 201.2 lb

## 2021-07-28 DIAGNOSIS — R0602 Shortness of breath: Secondary | ICD-10-CM

## 2021-07-28 MED ORDER — BUDESONIDE-FORMOTEROL FUMARATE 160-4.5 MCG/ACT IN AERO: 2.0000 | INHALATION_SPRAY | Freq: Two times a day (BID) | RESPIRATORY_TRACT | 5 refills | Status: DC

## 2021-07-28 NOTE — Progress Notes (Addendum)
? ?      ?Tamara Mcclain    [id number]    [birth year] ? ?Primary Care Physician:Baity, Salvadore Oxford, NP ? ?Referring Physician: Lorre Munroe, NP ?[address] ?[place],  [place] [address] ? ?Chief complaint: Consult for asthma ? ?HPI: ?21 year old with history of anxiety, von Willebrand's disease, childhood asthma, allergic ?Complains of dyspnea on exertion, occasional symptoms at rest with allergies, chest congestion.  Evaluated in the ED on 3/16 with normal chest x-ray, D-dimer and EKG showing sinus tachycardia with nonspecific abnormality.  Symptoms improved with albuterol.  She has occasionally been on prednisone in the past with some improvement.  She recently got her prescription for prednisone taper from 4 primary care but has not started using ? ? ?Pets: Dog ?Occupation: Works at [employer] ?Exposures: No mold, hot tub, Financial controller.  No feather pillows or comforter ?Smoking history: Never smoker.  No vaping ?Travel history: No significant travel ?Relevant family history: Family history of asthma ? ? ?Outpatient Encounter Medications as of 07/28/2021  ?Medication Sig  ? albuterol (VENTOLIN HFA) 108 (90 Base) MCG/ACT inhaler Inhale 1-2 puffs into the lungs every 6 (six) hours as needed for wheezing or shortness of breath.  ? ibuprofen (ADVIL) 800 MG tablet Take 800 mg by mouth 3 (three) times daily.  ? letrozole (FEMARA) 2.5 MG tablet Take 2.5 mg by mouth See admin instructions. Take 3rd day after start of period until 7th day  ? Melatonin 10 MG CAPS Take by mouth.  ? ondansetron (ZOFRAN-ODT) 4 MG disintegrating tablet Take 1 tablet (4 mg total) by mouth every 8 (eight) hours as needed for nausea or vomiting. (Patient not taking: Reported on 07/28/2021)  ? predniSONE (DELTASONE) 10 MG tablet Take 5 tabs with breakfast Day 1, 4 tabs Day 2, 3 tabs Day 3, 2 tabs Day 4, 1 tabs Day 5 (Patient not taking: Reported on 07/28/2021)  ? [DISCONTINUED] benzonatate (TESSALON) 100 MG capsule Take 1 capsule (100 mg total) by mouth  every 8 (eight) hours. (Patient not taking: Reported on 07/24/2021)  ? [DISCONTINUED] metroNIDAZOLE (FLAGYL) 500 MG tablet Take 1 tablet (500 mg total) by mouth 2 (two) times daily. (Patient not taking: Reported on 07/24/2021)  ? ?No facility-administered encounter medications on file as of 07/28/2021.  ? ? ?Allergies as of 07/28/2021 - Review Complete 07/28/2021  ?Allergen Reaction Noted  ? Tylenol [acetaminophen] Other (See Comments) 08/18/2016  ? Depo-medrol [methylprednisolone] Nausea Only 10/24/2019  ? ? ?Past Medical History:  ?Diagnosis Date  ? Anxiety   ? Epiploic appendagitis   ? MTHFR mutation   ? Von Willebrand disease Dr. Janee Morn at [facility]  ? ? ?Past Surgical History:  ?Procedure Laterality Date  ? ESOPHAGOGASTRODUODENOSCOPY (EGD) WITH PROPOFOL N/A 01/30/2020  ? Procedure: ESOPHAGOGASTRODUODENOSCOPY (EGD) WITH PROPOFOL;  Surgeon: Wyline Mood, MD;  Location: [facility] ENDOSCOPY;  Service: Gastroenterology;  Laterality: N/A;  ? WISDOM TOOTH EXTRACTION  08/2018  ? no bleeding complications  ? ? ?Family History  ?Problem Relation Age of Onset  ? Arthritis Maternal Grandmother   ? Hyperlipidemia Maternal Grandmother   ? CAD Maternal Grandfather   ? Cancer Paternal Grandmother   ?     melanoma, sinus cancer  ? Colon polyps Paternal Grandmother   ? Esophageal cancer Neg Hx   ? Stomach cancer Neg Hx   ? Liver disease Neg Hx   ? ? ?Social History  ? ?Socioeconomic History  ? Marital status: Single  ?  Spouse name: Not on file  ? Number  of children: Not on file  ? Years of education: Not on file  ? Highest education level: Not on file  ?Occupational History  ? Not on file  ?Tobacco Use  ? Smoking status: Never  ?  Passive exposure: Past  ? Smokeless tobacco: Never  ?Vaping Use  ? Vaping Use: Never used  ?Substance and Sexual Activity  ? Alcohol use: No  ?  Alcohol/week: 0.0 standard drinks  ? Drug use: No  ? Sexual activity: Yes  ?  Birth control/protection: None  ?Other Topics Concern  ? Not on file  ?Social History  Narrative  ? Not on file  ? ?Social Determinants of Health  ? ?Financial Resource Strain: Not on file  ?Food Insecurity: Not on file  ?Transportation Needs: Not on file  ?Physical Activity: Not on file  ?Stress: Not on file  ?Social Connections: Not on file  ?Intimate Partner Violence: Not on file  ? ? ?Review of systems: ?Review of Systems  ?Constitutional: Negative for fever and chills.  ?HENT: Negative.   ?Eyes: Negative for blurred vision.  ?Respiratory: as per HPI  ?Cardiovascular: Negative for chest pain and palpitations.  ?Gastrointestinal: Negative for vomiting, diarrhea, blood per rectum. ?Genitourinary: Negative for dysuria, urgency, frequency and hematuria.  ?Musculoskeletal: Negative for myalgias, back pain and joint pain.  ?Skin: Negative for itching and rash.  ?Neurological: Negative for dizziness, tremors, focal weakness, seizures and loss of consciousness.  ?Endo/Heme/Allergies: Negative for environmental allergies.  ?Psychiatric/Behavioral: Negative for depression, suicidal ideas and hallucinations.  ?All other systems reviewed and are negative. ? ?Physical Exam: ?Blood pressure 124/78, pulse 94, temperature 98.1 ?F (36.7 ?C), temperature source Oral, height 5' (1.524 m), weight 201 lb 3.2 oz (91.3 kg), last menstrual period 07/08/2021, SpO2 99 %. ?Gen:      No acute distress ?HEENT:  EOMI, sclera anicteric ?Neck:     No masses; no thyromegaly ?Lungs:    Clear to auscultation bilaterally; normal respiratory effort ?CV:         Regular rate and rhythm; no murmurs ?Abd:      + bowel sounds; soft, non-tender; no palpable masses, no distension ?Ext:    No edema; adequate peripheral perfusion ?Skin:      Warm and dry; no rash ?Neuro: alert and oriented x 3 ?Psych: normal mood and affect ? ?Data Reviewed: ?Imaging: ?Chest x-ray 07/24/2021 ?No evidence of active cardiopulmonary disease.  I have reviewed the images personally ? ?PFTs: ? ? ?Labs: ?CBC with differential 07/24/2021-WBC 6, eos 3%, absolute  eosinophil count 180 ? ?Assessment:  ?Assessment for asthma ?CBC noted with absolute eosinophils of 180.  Check IgE today ?Start Symbicort.  Start the prednisone taper as prescribed by primary care ?Schedule PFTs and follow-up in ? ?Plan/Recommendations: ?Check IgE ?Start Symbicort ?Prednisone taper ?PFTs ? ?Chilton Greathouse MD ?[facility] ?07/28/2021, 4:04 PM ? ?CC: Lorre Munroe, NP ? ?  ?

## 2021-07-28 NOTE — Patient Instructions (Signed)
We will check an IgE today ?Start Symbicort 160.  Take 2 puffs twice daily ?You already have a prescription for prednisone.  It will be helpful if he can start taking this ?We will schedule PFTs and follow-up in clinic in 2-3 months ?

## 2021-07-29 LAB — IGE: IgE (Immunoglobulin E), Serum: 163 kU/L — ABNORMAL HIGH (ref <=114)

## 2021-08-24 ENCOUNTER — Encounter (HOSPITAL_BASED_OUTPATIENT_CLINIC_OR_DEPARTMENT_OTHER): Payer: Self-pay | Admitting: Emergency Medicine

## 2021-08-24 ENCOUNTER — Other Ambulatory Visit: Payer: Self-pay

## 2021-08-24 ENCOUNTER — Emergency Department (HOSPITAL_BASED_OUTPATIENT_CLINIC_OR_DEPARTMENT_OTHER)
Admission: EM | Admit: 2021-08-24 | Discharge: 2021-08-24 | Disposition: A | Discharge status: Home / Self Care | Payer: BC Managed Care – PPO | Attending: Emergency Medicine | Admitting: Emergency Medicine

## 2021-08-24 DIAGNOSIS — X509XXA Other and unspecified overexertion or strenuous movements or postures, initial encounter: Secondary | ICD-10-CM | POA: Insufficient documentation

## 2021-08-24 DIAGNOSIS — M25561 Pain in right knee: Secondary | ICD-10-CM | POA: Diagnosis not present

## 2021-08-24 NOTE — ED Triage Notes (Signed)
Pt c/o right knee pain onset after fall about 2 weeks ago. Pt seen by [facility] and is waiting for MRI appointment. Pt presents with brace to right knee. Pt has tried ibuprofen and ice without relief. ?

## 2021-08-24 NOTE — Discharge Instructions (Addendum)
You came to the emergency department today to be evaluated for your knee pain.  Please continue wearing your knee brace and using crutches to remain nonweightbearing.  Please elevate your knee as much as possible when resting.  You may apply ice for 20 minutes at a time, and then give yourself a 20-minute rest.  Before reapplying ice.  Please take ibuprofen as outlined below.  Please call [facility] tomorrow for repeat evaluation of your knee. ? ?Please take Ibuprofen (Advil, motrin) to relieve your pain.   ? ?You may take up to 600 MG (3 pills) of normal strength ibuprofen every 8 hours as needed.   ? ?Please check all medication labels as many medications such as pain and cold medications may contain tylenol.  Do not drink alcohol while taking these medications.  Do not take other NSAID'S while taking ibuprofen (such as aleve or naproxen).  Please take ibuprofen with food to decrease stomach upset.  ? ?Get help right away if: ?Your knee swells, and the swelling gets worse. ?You cannot move your knee. ?You have very bad knee pain that does not get better with pain medicine. ?

## 2021-08-24 NOTE — ED Notes (Signed)
Dc instructions reviewed with patient. Patient voiced understanding. Dc with belongings.  °

## 2021-08-24 NOTE — ED Provider Notes (Signed)
?[facility] EMERGENCY DEPT ?Provider Note ? ? ?CSN: [id number] ?Arrival date & time: 08/24/21  0940 ? ?  ? ?History ? ?Chief Complaint  ?Patient presents with  ? Knee Pain  ? ? ?Tamara Mcclain is a 21 y.o. female with pertinent history of von Willebrand's disease.  Presents emergency department for complaint of right knee pain.  Patient states that she twisted her right knee in a lateral position while coming downstairs approximately 1.5 weeks prior.  Patient reports that she had immediate pain to her right knee however was able to go to work.  Pain was getting progressively worse so she went to [facility] for further evaluation.  X-ray imaging did not show any fractures or dislocations.  Patient was placed in a brace and given crutches with plan to obtain MRI to evaluate for possible ACL injury.  Patient states that yesterday while at work she was bending down to switch out change.  Patient states that she "felt a pop."  Patient states that she has had increased pain to her right knee since that incident.  Patient has not been using crutches to remain nonweightbearing.  Patient has been taking ibuprofen to help with her pain.  Patient last took ibuprofen this morning at 4 AM. ? ?Patient denies any numbness, weakness, color change, pallor, wound, fever, chills, erythema, warmth to affected joint. ? ? ?Knee Pain ?Associated symptoms: no back pain, no fever and no neck pain   ? ?  ? ?Home Medications ?Prior to Admission medications   ?Medication Sig Start Date End Date Taking? Authorizing Provider  ?ibuprofen (ADVIL) 800 MG tablet Take 800 mg by mouth 3 (three) times daily. 11/28/20  Yes [provider]  ?Melatonin 10 MG CAPS Take by mouth.   Yes [provider]  ?albuterol (VENTOLIN HFA) 108 (90 Base) MCG/ACT inhaler Inhale 1-2 puffs into the lungs every 6 (six) hours as needed for wheezing or shortness of breath. 07/24/21   Placido Sou, PA-C  ?budesonide-formoterol (SYMBICORT)  160-4.5 MCG/ACT inhaler Inhale 2 puffs into the lungs in the morning and at bedtime. 07/28/21   Mannam, Colbert Coyer, MD  ?letrozole (FEMARA) 2.5 MG tablet Take 2.5 mg by mouth See admin instructions. Take 3rd day after start of period until 7th day 07/08/21   [provider]  ?ondansetron (ZOFRAN-ODT) 4 MG disintegrating tablet Take 1 tablet (4 mg total) by mouth every 8 (eight) hours as needed for nausea or vomiting. ?Patient not taking: Reported on 07/28/2021 06/25/21   Mickie Bail, NP  ?predniSONE (DELTASONE) 10 MG tablet Take 5 tabs with breakfast Day 1, 4 tabs Day 2, 3 tabs Day 3, 2 tabs Day 4, 1 tabs Day 5 ?Patient not taking: Reported on 07/28/2021 07/25/21   Smitty Cords, DO  ?   ? ?Allergies    ?Tylenol [acetaminophen] and Depo-medrol [methylprednisolone]   ? ?Review of Systems   ?Review of Systems  ?Constitutional:  Negative for chills and fever.  ?Musculoskeletal:  Positive for arthralgias. Negative for back pain and neck pain.  ?Skin:  Negative for color change, pallor, rash and wound.  ?Neurological:  Negative for weakness and numbness.  ? ?Physical Exam ?Updated Vital Signs ?BP (!) 133/92 (BP Location: Right Arm)   Pulse 89   Temp 98 ?F (36.7 ?C) (Oral)   Resp 18   Ht 5' (1.524 m)   Wt 90.7 kg   LMP 08/10/2021   SpO2 100%   BMI 39.06 kg/m?  ?Physical Exam ?Vitals and nursing note  reviewed.  ?Constitutional:   ?   General: She is not in acute distress. ?   Appearance: She is not ill-appearing, toxic-appearing or diaphoretic.  ?HENT:  ?   Head: Normocephalic.  ?Eyes:  ?   General: No scleral icterus.    ?   Right eye: No discharge.     ?   Left eye: No discharge.  ?Cardiovascular:  ?   Rate and Rhythm: Normal rate.  ?   Pulses:     ?     Dorsalis pedis pulses are 2+ on the right side and 2+ on the left side.  ?Pulmonary:  ?   Effort: Pulmonary effort is normal.  ?Musculoskeletal:  ?   Right upper leg: Normal.  ?   Left upper leg: Normal.  ?   Right knee: Bony tenderness and crepitus  present. No swelling, deformity, effusion, erythema, ecchymosis or lacerations. Decreased range of motion. Tenderness present over the medial joint line, lateral joint line and ACL. No MCL, LCL, PCL or patellar tendon tenderness. Normal alignment.  ?   Left knee: No swelling, deformity, effusion, ecchymosis, lacerations, bony tenderness or crepitus. Normal range of motion. No tenderness. Normal alignment.  ?   Right lower leg: Normal.  ?   Left lower leg: Normal.  ?   Right ankle: No swelling, deformity, ecchymosis or lacerations. No tenderness. Normal range of motion.  ?   Left ankle: No swelling, deformity, ecchymosis or lacerations. No tenderness. Normal range of motion.  ?   Right foot: Normal range of motion and normal capillary refill. No swelling, deformity, tenderness, bony tenderness or crepitus. Normal pulse.  ?   Left foot: Normal range of motion and normal capillary refill. No swelling, deformity, tenderness, bony tenderness or crepitus. Normal pulse.  ?   Comments: Crepitus inferior to patella.  Tenderness to medial and lateral joint line.  Decreased range of motion secondary to complaints of pain.  No tenderness to quadriceps tendon or defect to quadriceps tendon.  Pulse, motor, and sensation intact distally  ?Skin: ?   General: Skin is warm and dry.  ?Neurological:  ?   General: No focal deficit present.  ?   Mental Status: She is alert.  ?Psychiatric:     ?   Behavior: Behavior is cooperative.  ? ? ?ED Results / Procedures / Treatments   ?Labs ?(all labs ordered are listed, but only abnormal results are displayed) ?Labs Reviewed - No data to display ? ?EKG ?None ? ?Radiology ?No results found. ? ?Procedures ?Procedures  ? ? ?Medications Ordered in ED ?Medications - No data to display ? ?ED Course/ Medical Decision Making/ A&P ?  ?                        ?Medical Decision Making ? ?Alert 21 year old female no acute distress, nontoxic-appearing.  Presents the emergency department with a chief complaint  of right knee pain. ? ?Information obtained from patient.  Past medical records were reviewed.  Patient has medical history as outlined in HPI which complicates her care. ? ?Patient complains of pain to right knee after bending yesterday.  Patient has been taking ibuprofen with some improvement in her pain.  She denies any numbness, weakness, pallor, color change, or wounds.  No swelling, erythema, or warmth to affected knee; low suspicion for septic arthritis at this time.  Pulse, motor, and incision intact distally. ? ?Shared decision making with patient about obtaining x-ray imaging.  Patient declines  x-ray imaging at this time.  Patient does have knee brace which she came in wearing.  Advised patient to remain nonweightbearing with crutches.  Symptomatic treatment with ibuprofen, ice, elevation, and rest.  We will give patient a note for work.  Patient to reach out to [facility] for repeat evaluation. ? ?Based on patient's chief complaint, I considered admission might be necessary, however after reassuring ED workup feel patient is reasonable for discharge.  Discussed results, findings, treatment and follow up. Patient advised of return precautions. Patient verbalized understanding and agreed with plan. ? ?Portions of this note were generated with [employer]. Dictation errors may occur despite best attempts at proofreading. ? ? ? ? ? ? ? ? ?Final Clinical Impression(s) / ED Diagnoses ?Final diagnoses:  ?None  ? ? ?Rx / DC Orders ?ED Discharge Orders   ? ? None  ? ?  ? ? ?  ?Loni Beckwith, PA-C ?08/24/21 1224 ? ?  ?Tegeler, Gwenyth Allegra, MD ?08/24/21 1621 ? ?

## 2021-09-29 ENCOUNTER — Ambulatory Visit: Payer: BC Managed Care – PPO | Admitting: Pulmonary Disease

## 2021-10-06 ENCOUNTER — Encounter: Payer: Self-pay | Admitting: Emergency Medicine

## 2021-10-06 ENCOUNTER — Emergency Department: Payer: BC Managed Care – PPO

## 2021-10-06 ENCOUNTER — Other Ambulatory Visit: Payer: Self-pay

## 2021-10-06 ENCOUNTER — Emergency Department
Admission: EM | Admit: 2021-10-06 | Discharge: 2021-10-06 | Disposition: A | Discharge status: Home / Self Care | Payer: BC Managed Care – PPO | Attending: Emergency Medicine | Admitting: Emergency Medicine

## 2021-10-06 DIAGNOSIS — R079 Chest pain, unspecified: Secondary | ICD-10-CM

## 2021-10-06 DIAGNOSIS — Z8709 Personal history of other diseases of the respiratory system: Secondary | ICD-10-CM

## 2021-10-06 DIAGNOSIS — R0789 Other chest pain: Secondary | ICD-10-CM | POA: Insufficient documentation

## 2021-10-06 DIAGNOSIS — J45909 Unspecified asthma, uncomplicated: Secondary | ICD-10-CM | POA: Diagnosis not present

## 2021-10-06 DIAGNOSIS — R0602 Shortness of breath: Secondary | ICD-10-CM

## 2021-10-06 LAB — CBC
HCT: 41.8 % (ref 36.0–46.0)
Hemoglobin: 13.8 g/dL (ref 12.0–15.0)
MCH: 26.7 pg (ref 26.0–34.0)
MCHC: 33 g/dL (ref 30.0–36.0)
MCV: 81 fL (ref 80.0–100.0)
Platelets: 360 10*3/uL (ref 150–400)
RBC: 5.16 MIL/uL — ABNORMAL HIGH (ref 3.87–5.11)
RDW: 12.7 % (ref 11.5–15.5)
WBC: 9.7 10*3/uL (ref 4.0–10.5)
nRBC: 0 % (ref 0.0–0.2)

## 2021-10-06 LAB — BASIC METABOLIC PANEL
Anion gap: 10 (ref 5–15)
BUN: 7 mg/dL (ref 6–20)
CO2: 24 mmol/L (ref 22–32)
Calcium: 9.2 mg/dL (ref 8.9–10.3)
Chloride: 103 mmol/L (ref 98–111)
Creatinine, Ser: 0.62 mg/dL (ref 0.44–1.00)
GFR, Estimated: >60 mL/min (ref >60)
Glucose, Bld: 111 mg/dL — ABNORMAL HIGH (ref 70–99)
Potassium: 3.5 mmol/L (ref 3.5–5.1)
Sodium: 137 mmol/L (ref 135–145)

## 2021-10-06 LAB — TROPONIN I (HIGH SENSITIVITY): Troponin I (High Sensitivity): 2 ng/L (ref <18)

## 2021-10-06 MED ORDER — ALBUTEROL SULFATE HFA 108 (90 BASE) MCG/ACT IN AERS: 1.0000 | INHALATION_SPRAY | Freq: Four times a day (QID) | RESPIRATORY_TRACT | 0 refills | Status: DC | PRN

## 2021-10-06 NOTE — ED Provider Notes (Signed)
[facility] Provider Note   Event Date/Time   First MD Initiated Contact with Patient 10/06/21 1326     (approximate) History  Chest Pain  HPI Tamara Mcclain is a 21 y.o. female with a stated past medical history of asthma who presents for central chest pain with associated shortness of breath that began approximately 12 hours prior to arrival and has resolved since this interview.  Patient states that she normally uses albuterol inhaler for this type of pain in the past that feels similar to asthma exacerbations however she has run out of her albuterol inhaler at this time with out in any way to get a new prescription.  Patient currently denies any chest pain, shortness of breath, dyspnea on exertion, diaphoresis, or fever/productive cough Physical Exam  Triage Vital Signs: ED Triage Vitals  Enc Vitals Group     BP 10/06/21 1243 124/87     Pulse Rate 10/06/21 1243 94     Resp 10/06/21 1243 16     Temp 10/06/21 1243 98.3 F (36.8 C)     Temp Source 10/06/21 1243 Oral     SpO2 10/06/21 1243 95 %     Weight 10/06/21 1242 200 lb (90.7 kg)     Height 10/06/21 1242 5' (1.524 m)     Head Circumference --      Peak Flow --      Pain Score 10/06/21 1242 7     Pain Loc --      Pain Edu? --      Excl. in GC? --    Most recent vital signs: Vitals:   10/06/21 1243  BP: 124/87  Pulse: 94  Resp: 16  Temp: 98.3 F (36.8 C)  SpO2: 95%   General: Awake, oriented x4. CV:  Good peripheral perfusion.  Resp:  Normal effort.  Mild end expiratory wheezes over bilateral lung fields Abd:  Mcclain distention.  Other:  Patient is a young adult Caucasian overweight female laying in bed in Mcclain acute distress ED Results / Procedures / Treatments  Labs (all labs ordered are listed, but only abnormal results are displayed) Labs Reviewed  BASIC METABOLIC PANEL - Abnormal; Notable for the following components:      Result Value   Glucose, Bld 111 (*)    All other components  within normal limits  CBC - Abnormal; Notable for the following components:   RBC 5.16 (*)    All other components within normal limits  POC URINE PREG, ED  TROPONIN I (HIGH SENSITIVITY)  TROPONIN I (HIGH SENSITIVITY)   EKG ED ECG REPORT I, Merwyn Katos, the attending physician, personally viewed and interpreted this ECG. Date: 10/06/2021 EKG Time: 1240 Rate: 85 Rhythm: normal sinus rhythm QRS Axis: normal Intervals: normal ST/T Wave abnormalities: normal Narrative Interpretation: Mcclain evidence of acute ischemia RADIOLOGY ED MD interpretation: You chest x-ray interpreted by me shows Mcclain evidence of acute abnormalities including Mcclain pneumonia, pneumothorax, or widened mediastinum -Agree with radiology assessment Official radiology report(s): DG Chest 2 View  Result Date: 10/06/2021 CLINICAL DATA:  Chest pain EXAM: CHEST - 2 VIEW COMPARISON:  Chest two views 07/24/2021 FINDINGS: Cardiac silhouette and mediastinal contours are within normal limits. The lungs are clear. Mcclain pleural effusion or pneumothorax. Mcclain acute skeletal abnormality. IMPRESSION: Mcclain active cardiopulmonary disease. Electronically Signed   By: Neita Garnet M.D.   On: 10/06/2021 13:19   PROCEDURES: Critical Care performed: Mcclain .1-3 Lead EKG Interpretation Performed by: Merwyn Katos,  MD Authorized by: Merwyn Katos, MD     Interpretation: normal     ECG rate:  93   ECG rate assessment: normal     Rhythm: sinus rhythm     Ectopy: none     Conduction: normal   MEDICATIONS ORDERED IN ED: Medications - Mcclain data to display IMPRESSION / MDM / ASSESSMENT AND PLAN / ED COURSE  I reviewed the triage vital signs and the nursing notes.                             The patient is on the cardiac monitor to evaluate for evidence of arrhythmia and/or significant heart rate changes. Presentation most consistent with acute asthma exacerbation.  Presentation less concerning for pneumonia, heart failure, foreign body airway  obstruction, pulmonary embolism, tamponade, atypical ACS  Reassuring factors: Mcclain AMS, silent respirations, belly-breathing, or other sign of impending ventilatory failure. Never intubated or admitted to the hospital for asthma exacerbation.  Workup Laboratory and radiologic evaluation did not show any evidence of acute abnormalities EKG shows Mcclain evidence of acute ischemia  Reassessment: Patient respiratory status improved with breathing treatment.  Disposition: Discharge home with return precautions. Advised to follow up with primary care physician within next 24-48 hours.    FINAL CLINICAL IMPRESSION(S) / ED DIAGNOSES   Final diagnoses:  Chest pain, unspecified type  Shortness of breath  History of asthma   Rx / DC Orders   ED Discharge Orders          Ordered    albuterol (VENTOLIN HFA) 108 (90 Base) MCG/ACT inhaler  Every 6 hours PRN        10/06/21 1530           Note:  This document was prepared using Dragon voice recognition software and may include unintentional dictation errors.   Merwyn Katos, MD 10/06/21 1536

## 2021-10-06 NOTE — ED Triage Notes (Signed)
Pt to ED via POV c/o chest pain that started this morning while she was the grocery store. Pt that there pain is in the center of her chest and does not radiate. Pt states that she has also had dizziness and felt like she was going to pass out. Pt states that she feels like she cant catch her breath. Pt is in NAD at this time.

## 2021-10-07 ENCOUNTER — Ambulatory Visit: Payer: BC Managed Care – PPO | Admitting: Family Medicine

## 2021-10-10 ENCOUNTER — Institutional Professional Consult (permissible substitution): Payer: BC Managed Care – PPO | Admitting: Plastic Surgery

## 2021-11-10 ENCOUNTER — Ambulatory Visit: Payer: BC Managed Care – PPO | Admitting: Internal Medicine

## 2021-11-10 ENCOUNTER — Encounter: Payer: Self-pay | Admitting: Internal Medicine

## 2021-11-10 VITALS — BP 134/84 | HR 86 | Temp 96.9°F | Wt 202.0 lb

## 2021-11-10 DIAGNOSIS — G4452 New daily persistent headache (NDPH): Secondary | ICD-10-CM

## 2021-11-10 DIAGNOSIS — E6609 Other obesity due to excess calories: Secondary | ICD-10-CM | POA: Insufficient documentation

## 2021-11-10 DIAGNOSIS — I10 Essential (primary) hypertension: Secondary | ICD-10-CM | POA: Insufficient documentation

## 2021-11-10 DIAGNOSIS — E66812 Obesity, class 2: Secondary | ICD-10-CM | POA: Insufficient documentation

## 2021-11-10 DIAGNOSIS — Z6839 Body mass index (BMI) 39.0-39.9, adult: Secondary | ICD-10-CM | POA: Diagnosis not present

## 2021-11-10 MED ORDER — LISINOPRIL 10 MG PO TABS: 10.0000 mg | ORAL_TABLET | Freq: Every day | ORAL | 0 refills | Status: DC

## 2021-11-10 NOTE — Assessment & Plan Note (Signed)
Encourage diet and exercise for weight loss 

## 2021-11-10 NOTE — Progress Notes (Signed)
Subjective:    Patient ID: Tamara Mcclain, female    DOB: 29-Apr-2001, 21 y.o.   MRN: [id number]  HPI  Patient presents to clinic today with complaint of fa headache. She reports this started a few weeks ago. The headaches are located in her forehead. She describes the pain as pressure. She reports associated dizziness and nausea but denies sensitivity to light, sound or vomiting. She does have some associated neck pain.She denies runny nose, nasal congestion, ear pain, sore throat or cough. She has tried Ibuprofen OTC with minimal relief of symptoms. She does not feel stressed but she has been feeling anxious. Her BP Today is 134/84. She has never been diagnosed with HTN. She has seen her eye doctor in the last year, but her eye glass prescription did not change.  Review of Systems     Past Medical History:  Diagnosis Date   Anxiety    Epiploic appendagitis    MTHFR mutation    Von Willebrand disease (HCC) Dr. Janee Morn at [facility]    Current Outpatient Medications  Medication Sig Dispense Refill   albuterol (VENTOLIN HFA) 108 (90 Base) MCG/ACT inhaler Inhale 1-2 puffs into the lungs every 6 (six) hours as needed for wheezing or shortness of breath. 8 g 0   budesonide-formoterol (SYMBICORT) 160-4.5 MCG/ACT inhaler Inhale 2 puffs into the lungs in the morning and at bedtime. 10.2 g 5   ibuprofen (ADVIL) 800 MG tablet Take 800 mg by mouth 3 (three) times daily.     letrozole (FEMARA) 2.5 MG tablet Take 2.5 mg by mouth See admin instructions. Take 3rd day after start of period until 7th day     Melatonin 10 MG CAPS Take by mouth.     ondansetron (ZOFRAN-ODT) 4 MG disintegrating tablet Take 1 tablet (4 mg total) by mouth every 8 (eight) hours as needed for nausea or vomiting. (Patient not taking: Reported on 07/28/2021) 20 tablet 0   predniSONE (DELTASONE) 10 MG tablet Take 5 tabs with breakfast Day 1, 4 tabs Day 2, 3 tabs Day 3, 2 tabs Day 4, 1 tabs Day 5 (Patient not taking: Reported on  07/28/2021) 15 tablet 0   No current facility-administered medications for this visit.    Allergies  Allergen Reactions   Tylenol [Acetaminophen] Other (See Comments)    fever   Depo-Medrol [Methylprednisolone] Nausea Only    Feeling flushed    Family History  Problem Relation Age of Onset   Arthritis Maternal Grandmother    Hyperlipidemia Maternal Grandmother    CAD Maternal Grandfather    Cancer Paternal Grandmother        melanoma, sinus cancer   Colon polyps Paternal Grandmother    Esophageal cancer Neg Hx    Stomach cancer Neg Hx    Liver disease Neg Hx     Social History   Socioeconomic History   Marital status: Single    Spouse name: Not on file   Number of children: Not on file   Years of education: Not on file   Highest education level: Not on file  Occupational History   Not on file  Tobacco Use   Smoking status: Never    Passive exposure: Past   Smokeless tobacco: Never  Vaping Use   Vaping Use: Never used  Substance and Sexual Activity   Alcohol use: No    Alcohol/week: 0.0 standard drinks of alcohol   Drug use: No   Sexual activity: Yes    Birth control/protection: None  Other Topics Concern   Not on file  Social History Narrative   Not on file   Social Determinants of Health   Financial Resource Strain: Not on file  Food Insecurity: Not on file  Transportation Needs: Not on file  Physical Activity: Not on file  Stress: Not on file  Social Connections: Not on file  Intimate Partner Violence: Not on file     Constitutional: Patient reports headache.  Denies fever, malaise, fatigue, or abrupt weight changes.  HEENT: Denies eye pain, eye redness, ear pain, ringing in the ears, wax buildup, runny nose, nasal congestion, bloody nose, or sore throat. Respiratory: Denies difficulty breathing, shortness of breath, cough or sputum production.   Cardiovascular: Denies chest pain, chest tightness, palpitations or swelling in the hands or feet.   Musculoskeletal: Pt reports intermittent neck pain. Denies decrease in range of motion, difficulty with gait,  or joint pain and swelling.  Skin: Denies redness, rashes, lesions or ulcercations.  Neurological: Pt reports intermittent dizziness. Denies difficulty with memory, difficulty with speech or problems with balance and coordination.  Psych: Pt reports anxiety. Denies depression, SI/HI.   No other specific complaints in a complete review of systems (except as listed in HPI above).  Objective:   Physical Exam  BP 134/84 (BP Location: Left Arm, Patient Position: Sitting, Cuff Size: Normal)   Pulse 86   Temp (!) 96.9 F (36.1 C) (Temporal)   Wt 202 lb (91.6 kg)   SpO2 97%   BMI 39.45 kg/m    Wt Readings from Last 3 Encounters:  10/06/21 200 lb (90.7 kg)  08/24/21 200 lb (90.7 kg)  07/28/21 201 lb 3.2 oz (91.3 kg)    General: Appears her stated age, obese, in NAD. Skin: Warm, dry and intact.  HEENT: Head: normal shape and size; Eyes: sclera white, no icterus, conjunctiva pink, PERRLA and EOMs intact;  Cardiovascular: Normal rate and rhythm. S1,S2 noted.  No murmur, rubs or gallops noted.  Pulmonary/Chest: Normal effort and positive vesicular breath sounds. No respiratory distress. No wheezes, rales or ronchi noted.  Musculoskeletal: Normal flexion, extension and rotation of the cervical spine.  No bony tenderness noted over the cervical spine.  Pain with palpation of the bilateral paracervical muscles.  Shoulder shrug equal.  No difficulty with gait.  Neurological: Alert and oriented. Coordination normal.  Psychiatric: Mood and affect normal. Behavior is normal. Judgment and thought content normal.    BMET    Component Value Date/Time   NA 137 10/06/2021 1243   K 3.5 10/06/2021 1243   CL 103 10/06/2021 1243   CO2 24 10/06/2021 1243   GLUCOSE 111 (H) 10/06/2021 1243   BUN 7 10/06/2021 1243   CREATININE 0.62 10/06/2021 1243   CALCIUM 9.2 10/06/2021 1243   GFRNONAA  >60 10/06/2021 1243   GFRAA >60 04/09/2018 2151    Lipid Panel  No results found for: "CHOL", "TRIG", "HDL", "CHOLHDL", "VLDL", "LDLCALC"  CBC    Component Value Date/Time   WBC 9.7 10/06/2021 1243   RBC 5.16 (H) 10/06/2021 1243   HGB 13.8 10/06/2021 1243   HGB 14.1 05/18/2021 1126   HCT 41.8 10/06/2021 1243   HCT 42.5 05/18/2021 1126   PLT 360 10/06/2021 1243   PLT 366 05/18/2021 1126   MCV 81.0 10/06/2021 1243   MCV 82 05/18/2021 1126   MCH 26.7 10/06/2021 1243   MCHC 33.0 10/06/2021 1243   RDW 12.7 10/06/2021 1243   RDW 12.6 05/18/2021 1126   LYMPHSABS 1.3 07/24/2021  1612   MONOABS 0.6 07/24/2021 1612   EOSABS 0.2 07/24/2021 1612   BASOSABS 0.0 07/24/2021 1612    Hgb A1C No results found for: "HGBA1C"         Assessment & Plan:   Headache associated with Hypertension:  Discussed getting her Toradol IM however I think this would only provide temporary relief Rx for Lisinopril 10 mg daily Reinforced DASH diet and exercise for weight loss Update me in the next 2 weeks for new or worsening symptoms  Schedule an appointment for your annual exam Nicki Reaper, NP

## 2021-11-10 NOTE — Patient Instructions (Signed)
Hypertension, Adult ?Hypertension is another name for high blood pressure. High blood pressure forces your heart to work harder to pump blood. This can cause problems over time. ?There are two numbers in a blood pressure reading. There is a top number (systolic) over a bottom number (diastolic). It is best to have a blood pressure that is below 120/80. ?What are the causes? ?The cause of this condition is not known. Some other conditions can lead to high blood pressure. ?What increases the risk? ?Some lifestyle factors can make you more likely to develop high blood pressure: ?Smoking. ?Not getting enough exercise or physical activity. ?Being overweight. ?Having too much fat, sugar, calories, or salt (sodium) in your diet. ?Drinking too much alcohol. ?Other risk factors include: ?Having any of these conditions: ?Heart disease. ?Diabetes. ?High cholesterol. ?Kidney disease. ?Obstructive sleep apnea. ?Having a family history of high blood pressure and high cholesterol. ?Age. The risk increases with age. ?Stress. ?What are the signs or symptoms? ?High blood pressure may not cause symptoms. Very high blood pressure (hypertensive crisis) may cause: ?Headache. ?Fast or uneven heartbeats (palpitations). ?Shortness of breath. ?Nosebleed. ?Vomiting or feeling like you may vomit (nauseous). ?Changes in how you see. ?Very bad chest pain. ?Feeling dizzy. ?Seizures. ?How is this treated? ?This condition is treated by making healthy lifestyle changes, such as: ?Eating healthy foods. ?Exercising more. ?Drinking less alcohol. ?Your doctor may prescribe medicine if lifestyle changes do not help enough and if: ?Your top number is above 130. ?Your bottom number is above 80. ?Your personal target blood pressure may vary. ?Follow these instructions at home: ?Eating and drinking ? ?If told, follow the DASH eating plan. To follow this plan: ?Fill one half of your plate at each meal with fruits and vegetables. ?Fill one fourth of your plate  at each meal with whole grains. Whole grains include whole-wheat pasta, brown rice, and whole-grain bread. ?Eat or drink low-fat dairy products, such as skim milk or low-fat yogurt. ?Fill one fourth of your plate at each meal with low-fat (lean) proteins. Low-fat proteins include fish, chicken without skin, eggs, beans, and tofu. ?Avoid fatty meat, cured and processed meat, or chicken with skin. ?Avoid pre-made or processed food. ?Limit the amount of salt in your diet to less than 1,500 mg each day. ?Do not drink alcohol if: ?Your doctor tells you not to drink. ?You are pregnant, may be pregnant, or are planning to become pregnant. ?If you drink alcohol: ?Limit how much you have to: ?0-1 drink a day for women. ?0-2 drinks a day for men. ?Know how much alcohol is in your drink. In the [place] one drink equals one 12 oz bottle of beer (355 mL), one 5 oz glass of wine (148 mL), or one 1? oz glass of hard liquor (44 mL). ?Lifestyle ? ?Work with your doctor to stay at a healthy weight or to lose weight. Ask your doctor what the best weight is for you. ?Get at least 30 minutes of exercise that causes your heart to beat faster (aerobic exercise) most days of the week. This may include walking, swimming, or biking. ?Get at least 30 minutes of exercise that strengthens your muscles (resistance exercise) at least 3 days a week. This may include lifting weights or doing Pilates. ?Do not smoke or use any products that contain nicotine or tobacco. If you need help quitting, ask your doctor. ?Check your blood pressure at home as told by your doctor. ?Keep all follow-up visits. ?Medicines ?Take over-the-counter and prescription medicines   only as told by your doctor. Follow directions carefully. ?Do not skip doses of blood pressure medicine. The medicine does not work as well if you skip doses. Skipping doses also puts you at risk for problems. ?Ask your doctor about side effects or reactions to medicines that you should watch  for. ?Contact a doctor if: ?You think you are having a reaction to the medicine you are taking. ?You have headaches that keep coming back. ?You feel dizzy. ?You have swelling in your ankles. ?You have trouble with your vision. ?Get help right away if: ?You get a very bad headache. ?You start to feel mixed up (confused). ?You feel weak or numb. ?You feel faint. ?You have very bad pain in your: ?Chest. ?Belly (abdomen). ?You vomit more than once. ?You have trouble breathing. ?These symptoms may be an emergency. Get help right away. Call 911. ?Do not wait to see if the symptoms will go away. ?Do not drive yourself to the hospital. ?Summary ?Hypertension is another name for high blood pressure. ?High blood pressure forces your heart to work harder to pump blood. ?For most people, a normal blood pressure is less than 120/80. ?Making healthy choices can help lower blood pressure. If your blood pressure does not get lower with healthy choices, you may need to take medicine. ?This information is not intended to replace advice given to you by your health care provider. Make sure you discuss any questions you have with your health care provider. ?Document Revised: 02/13/2021 Document Reviewed: 02/13/2021 ?Elsevier Patient Education ? 2023 [employer] ? ?

## 2021-11-12 ENCOUNTER — Ambulatory Visit
Admission: EM | Admit: 2021-11-12 | Discharge: 2021-11-12 | Disposition: A | Discharge status: Home / Self Care | Payer: BC Managed Care – PPO

## 2021-11-12 ENCOUNTER — Encounter: Payer: Self-pay | Admitting: Emergency Medicine

## 2021-11-12 ENCOUNTER — Ambulatory Visit: Payer: Self-pay | Admitting: *Deleted

## 2021-11-12 DIAGNOSIS — R079 Chest pain, unspecified: Secondary | ICD-10-CM

## 2021-11-12 DIAGNOSIS — R42 Dizziness and giddiness: Secondary | ICD-10-CM

## 2021-11-12 DIAGNOSIS — R0602 Shortness of breath: Secondary | ICD-10-CM

## 2021-11-12 DIAGNOSIS — R519 Headache, unspecified: Secondary | ICD-10-CM

## 2021-11-12 NOTE — Telephone Encounter (Signed)
Reason for Disposition  [1] Systolic BP  >= 130 OR Diastolic >= 80 AND [2] taking BP medications  Answer Assessment - Initial Assessment Questions 1. BLOOD PRESSURE: "What is the blood pressure?" "Did you take at least two measurements 5 minutes apart?"     154/87 P 91 9:05am, 151/94 P103- 9am 2. ONSET: "When did you take your blood pressure?"     Seen Tuesday and she has been checking it daily 3. HOW: "How did you obtain the blood pressure?" (e.g., visiting nurse, automatic home BP monitor)     Automatic cuff-arm 4. HISTORY: "Do you have a history of high blood pressure?"     Yes-recently diagnosed 5. MEDICATIONS: "Are you taking any medications for blood pressure?" "Have you missed any doses recently?"     1/2 tablet new start- to increase to full pill this Monday 6. OTHER SYMPTOMS: "Do you have any symptoms?" (e.g., headache, chest pain, blurred vision, difficulty breathing, weakness)     Some off/on dizziness, headache- comes and goes 7. PREGNANCY: "Is there any chance you are pregnant?" "When was your last menstrual period?"     No- LMP 10/28/21  Protocols used: Blood Pressure - High-A-AH

## 2021-11-12 NOTE — Telephone Encounter (Signed)
Have her go ahead and increase to a whole tab

## 2021-11-12 NOTE — Discharge Instructions (Addendum)
Go to the emergency department for evaluation of your chest pain and shortness of breath.    

## 2021-11-12 NOTE — ED Provider Notes (Signed)
Renaldo Fiddler    CSN: [id number] Arrival date & time: 11/12/21  1223      History   Chief Complaint Chief Complaint  Patient presents with   Chest Pain   Shortness of Breath    HPI Tamara Mcclain is a 21 y.o. female.  Patient presents with "tightness" in the center of her chest with associated shortness of breath since yesterday.  Her symptoms are worse since this morning.  She states her chest pain is constant but the shortness of breath improves with lying down.  She also reports dizziness and headache intermittently for a week.  She denies focal weakness, numbness, fever, chills, cough, nausea, vomiting, or other symptoms.  She was started on lisinopril by her PCP on 11/10/2021.  LMP 1 week ago.   The history is provided by the patient and medical records.    Past Medical History:  Diagnosis Date   Anxiety    Epiploic appendagitis    MTHFR mutation    Von Willebrand disease (HCC) Dr. Janee Morn at [facility]    Patient Active Problem List   Diagnosis Date Noted   Primary hypertension 11/10/2021   Class 2 obesity due to excess calories with body mass index (BMI) of 39.0 to 39.9 in adult 11/10/2021   Panic attacks 12/05/2019   Epiploic appendagitis 06/28/2019   Von Willebrand disease (HCC) 08/18/2016    Past Surgical History:  Procedure Laterality Date   ESOPHAGOGASTRODUODENOSCOPY (EGD) WITH PROPOFOL N/A 01/30/2020   Procedure: ESOPHAGOGASTRODUODENOSCOPY (EGD) WITH PROPOFOL;  Surgeon: Wyline Mood, MD;  Location: [facility] ENDOSCOPY;  Service: Gastroenterology;  Laterality: N/A;   WISDOM TOOTH EXTRACTION  08/2018   no bleeding complications    OB History     Gravida  1   Para      Term      Preterm      AB  1   Living         SAB  1   IAB      Ectopic      Multiple      Live Births               Home Medications    Prior to Admission medications   Medication Sig Start Date End Date Taking? Authorizing Provider  albuterol (VENTOLIN HFA) 108  (90 Base) MCG/ACT inhaler Inhale 1-2 puffs into the lungs every 6 (six) hours as needed for wheezing or shortness of breath. 10/06/21   Merwyn Katos, MD  budesonide-formoterol (SYMBICORT) 160-4.5 MCG/ACT inhaler Inhale 2 puffs into the lungs in the morning and at bedtime. 07/28/21   Mannam, Colbert Coyer, MD  ibuprofen (ADVIL) 800 MG tablet Take 800 mg by mouth 3 (three) times daily. 11/28/20   [provider]  lisinopril (ZESTRIL) 10 MG tablet Take 1 tablet (10 mg total) by mouth daily. 11/10/21   Lorre Munroe, NP  Melatonin 10 MG CAPS Take by mouth.    [provider]    Family History Family History  Problem Relation Age of Onset   Arthritis Maternal Grandmother    Hyperlipidemia Maternal Grandmother    CAD Maternal Grandfather    Cancer Paternal Grandmother        melanoma, sinus cancer   Colon polyps Paternal Grandmother    Esophageal cancer Neg Hx    Stomach cancer Neg Hx    Liver disease Neg Hx     Social History Social History   Tobacco Use   Smoking status: Never  Passive exposure: Past   Smokeless tobacco: Never  Vaping Use   Vaping Use: Never used  Substance Use Topics   Alcohol use: No    Alcohol/week: 0.0 standard drinks of alcohol   Drug use: No     Allergies   Tylenol [acetaminophen] and Depo-medrol [methylprednisolone]   Review of Systems Review of Systems  Constitutional:  Negative for chills and fever.  Eyes:  Negative for visual disturbance.  Respiratory:  Positive for shortness of breath. Negative for cough and wheezing.   Cardiovascular:  Positive for chest pain. Negative for palpitations.  Gastrointestinal:  Negative for abdominal pain, nausea and vomiting.  Skin:  Negative for color change and rash.  Neurological:  Positive for dizziness and headaches. Negative for syncope, facial asymmetry, speech difficulty, weakness, light-headedness and numbness.  All other systems reviewed and are negative.    Physical Exam Triage Vital  Signs ED Triage Vitals  Enc Vitals Group     BP      Pulse      Resp      Temp      Temp src      SpO2      Weight      Height      Head Circumference      Peak Flow      Pain Score      Pain Loc      Pain Edu?      Excl. in [place]?    No data found.  Updated Vital Signs BP (!) 139/92   Pulse 95   Temp 98.1 F (36.7 C)   Resp (!) 28   SpO2 98%   Visual Acuity Right Eye Distance:   Left Eye Distance:   Bilateral Distance:    Right Eye Near:   Left Eye Near:    Bilateral Near:     Physical Exam Vitals and nursing note reviewed.  Constitutional:      General: She is not in acute distress.    Appearance: She is well-developed. She is obese. She is not ill-appearing.  HENT:     Mouth/Throat:     Mouth: Mucous membranes are moist.  Cardiovascular:     Rate and Rhythm: Normal rate and regular rhythm.     Heart sounds: Normal heart sounds.  Pulmonary:     Effort: Pulmonary effort is normal. No respiratory distress.     Breath sounds: Normal breath sounds. No wheezing.  Abdominal:     Palpations: Abdomen is soft.     Tenderness: There is no abdominal tenderness.  Musculoskeletal:     Cervical back: Neck supple.     Right lower leg: No edema.     Left lower leg: No edema.  Skin:    General: Skin is warm and dry.  Neurological:     General: No focal deficit present.     Mental Status: She is alert and oriented to person, place, and time.     Sensory: No sensory deficit.     Motor: No weakness.     Gait: Gait normal.  Psychiatric:        Mood and Affect: Mood normal.        Behavior: Behavior normal.      UC Treatments / Results  Labs (all labs ordered are listed, but only abnormal results are displayed) Labs Reviewed - No data to display  EKG   Radiology No results found.  Procedures Procedures (including critical care time)  Medications Ordered in  UC Medications - No data to display  Initial Impression / Assessment and Plan / UC Course  I  have reviewed the triage vital signs and the nursing notes.  Pertinent labs & imaging results that were available during my care of the patient were reviewed by me and considered in my medical decision making (see chart for details).  Chest pain, shortness of breath, dizziness, headache.  EKG shows sinus rhythm, rate 96, no ST elevation, compared to previous from 10/07/2021.  Discussed limitations of evaluation of her symptoms in an urgent care setting.  Sending her to the emergency department for evaluation.  She declines EMS and states she feels stable to drive herself there.  Final Clinical Impressions(s) / UC Diagnoses   Final diagnoses:  Chest pain, unspecified type  Shortness of breath  Dizziness  Acute nonintractable headache, unspecified headache type     Discharge Instructions      Go to the emergency department for evaluation of your chest pain and shortness of breath.     ED Prescriptions   None    PDMP not reviewed this encounter.   Mickie Bail, NP 11/12/21 [phone]

## 2021-11-12 NOTE — Telephone Encounter (Signed)
Pt advised.   Thanks,   -Algernon Mundie  

## 2021-11-12 NOTE — ED Triage Notes (Signed)
Triaged by provider  

## 2021-11-12 NOTE — Telephone Encounter (Signed)
  Chief Complaint: elevated BP on medication Symptoms: headache Frequency: daily- new start BP medication  Pertinent Negatives: Patient denies chest pain, difficulty breathing, weakness Disposition: [] ED /[] Urgent Care (no appt availability in office) / [] Appointment(In office/virtual)/ []  [place] [facility]/ [] Home Care/ [] Refused Recommended Disposition /[]  Mobile Bus/ [x]  Follow-up with PCP Additional Notes:  7/3-150/98 P88, 7/4- 151/102 P105, 144/107 P66 Patient is taking half tablet BP medication  No open appointment- call sent for provider review to see if patient needs to increase her dosing.

## 2021-11-22 IMAGING — RF DG HYSTEROGRAM
3 series · 3 of 3 positions shown · IV contrast (omnipaque)
Comparison: None.

CLINICAL DATA: Infertility

EXAM:
HYSTEROSALPINGOGRAM
TECHNIQUE: Following cleansing of the cervix and vagina with Betadine solution,
a hysterosalpingogram was performed using a 5-French
hysterosalpingogram catheter and Omnipaque 300 contrast. The patient
tolerated the examination without difficulty.

[Series 1: cp_standard · 0.17mm/px · 1 of 1 slices shown (1 of 2)]
[im 1/1]
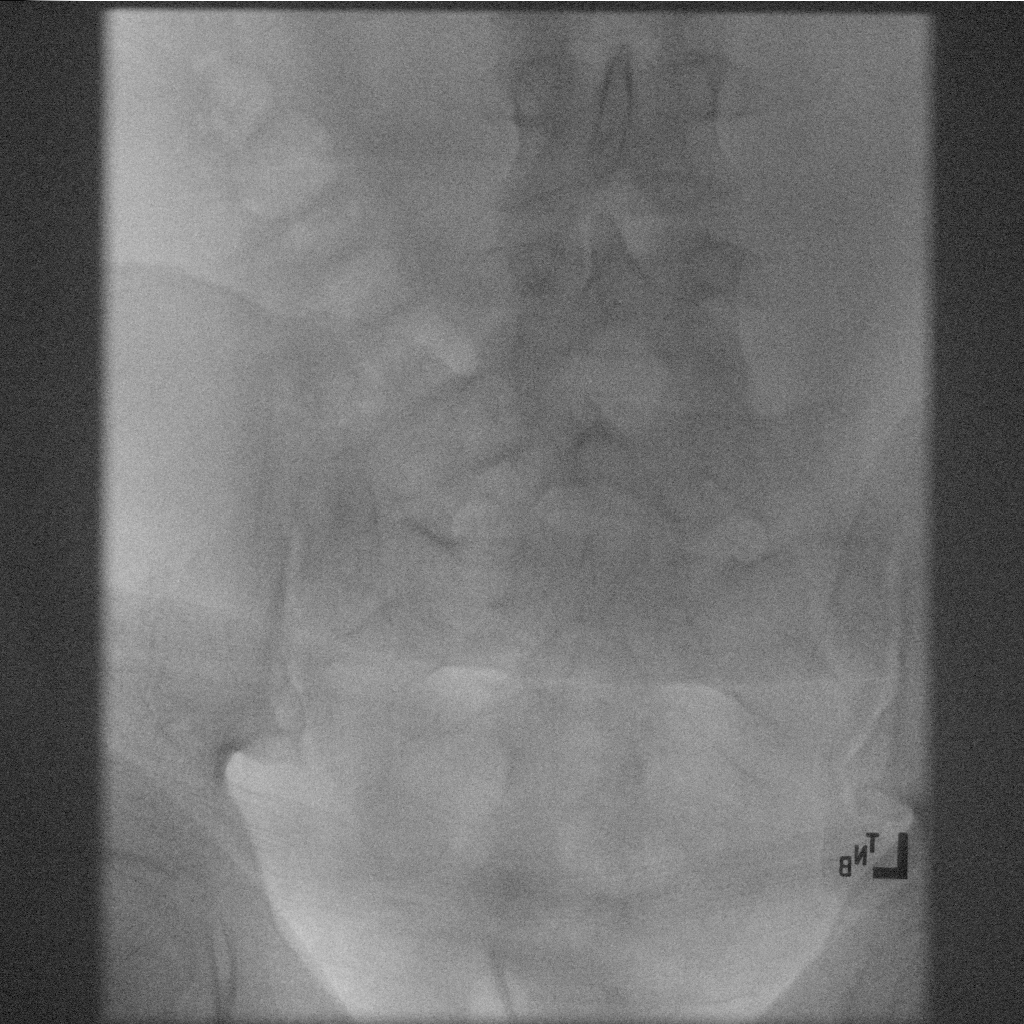

[Series 2: cp_standard · 0.17mm/px · 1 of 1 slices shown (2 of 2)]
[im 1/1]
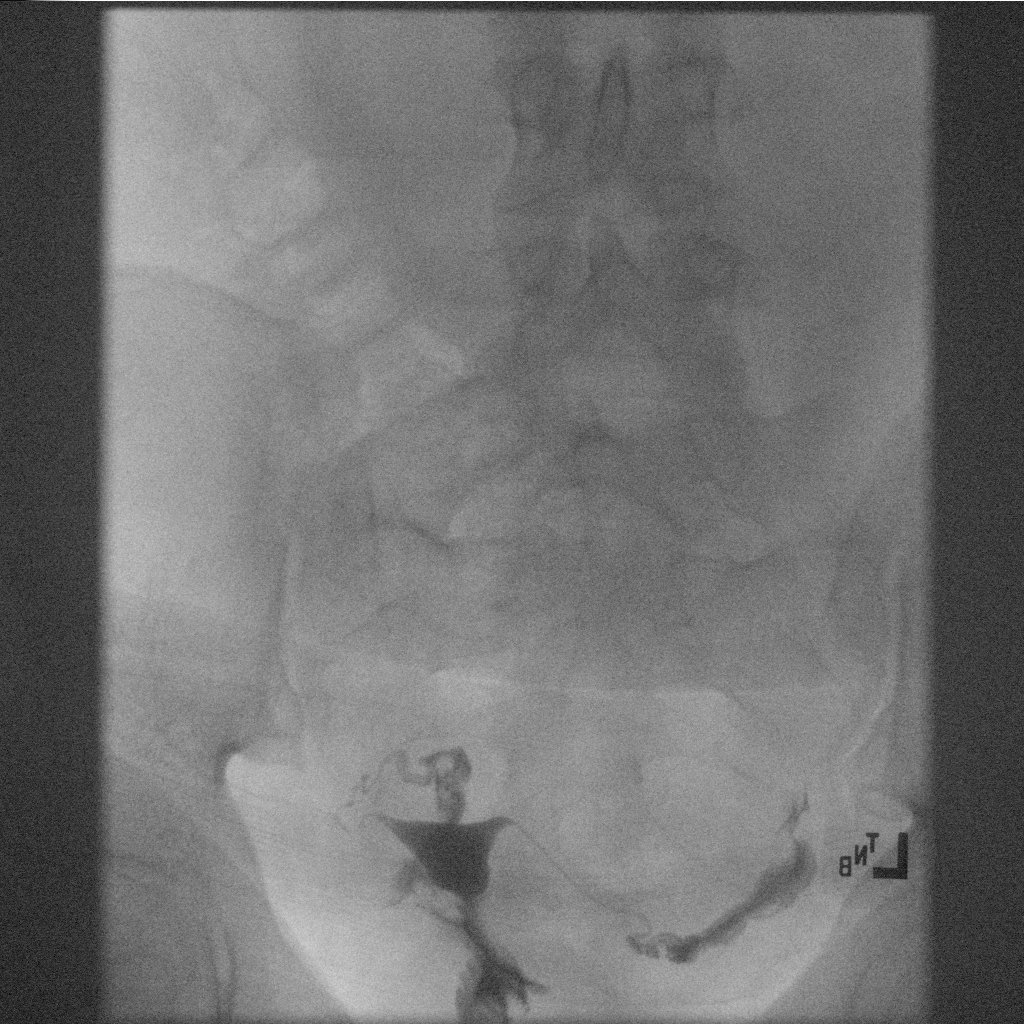

[Series 3: fluoro_hsg_2fps_bb · 0.17mm/px · 1 of 1 slices shown]
[im 1/1]
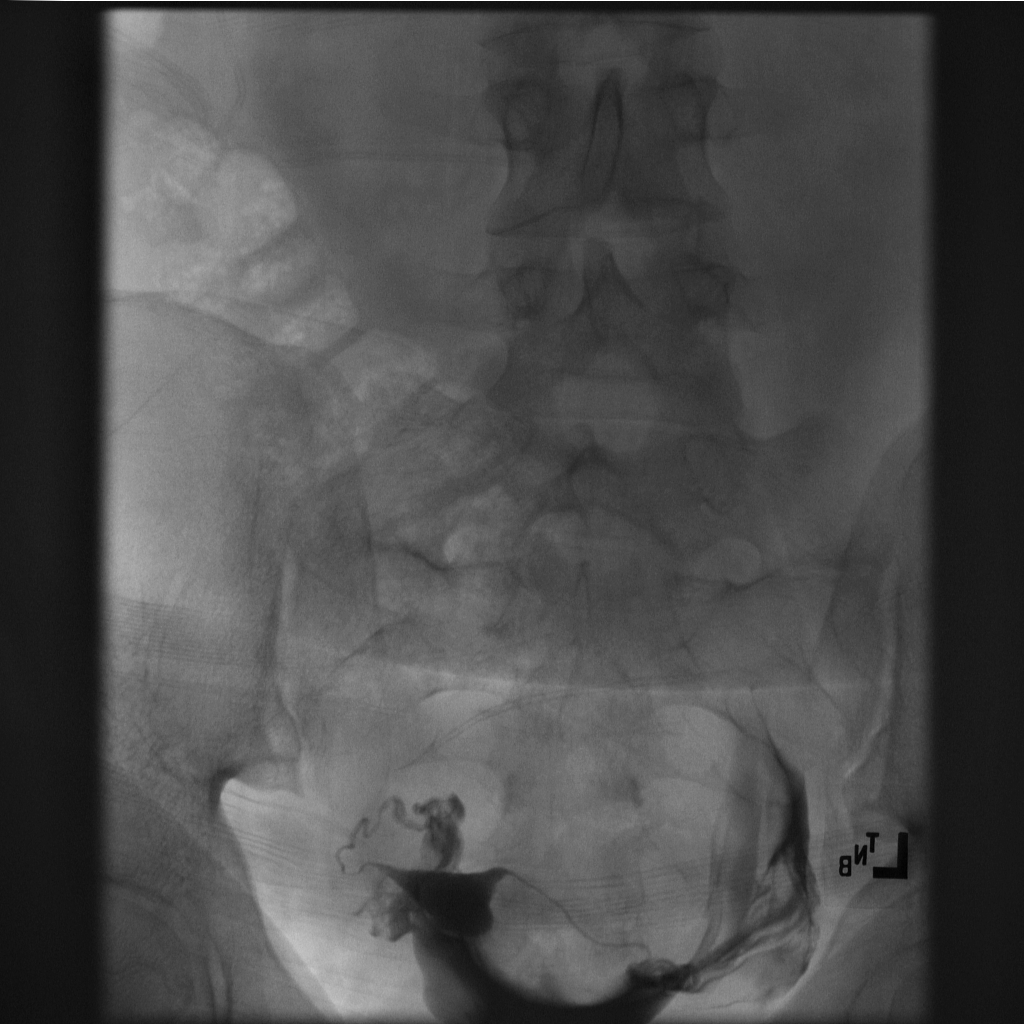

[3 of 3 positions shown; findings below may reference images not displayed]

FLUOROSCOPY TIME:  Radiation Exposure Index (as provided by the
fluoroscopic device): KCu2y*m2

Fluoroscopy Time:  6 seconds

Number of Acquired Images:  1
FINDINGS: The endometrial cavity is normal in contour without any filling
defects. Contrast is seen in the fallopian tubes bilaterally which
are normal in course and caliber. There is free intraperitoneal
spillage of contrast bilaterally.
IMPRESSION: Normal HSG.

## 2021-11-27 ENCOUNTER — Ambulatory Visit (INDEPENDENT_AMBULATORY_CARE_PROVIDER_SITE_OTHER): Payer: BC Managed Care – PPO | Admitting: Internal Medicine

## 2021-11-27 ENCOUNTER — Encounter: Payer: Self-pay | Admitting: Internal Medicine

## 2021-11-27 VITALS — BP 131/88 | HR 85 | Temp 97.8°F | Ht 60.0 in | Wt 199.0 lb

## 2021-11-27 DIAGNOSIS — Z0001 Encounter for general adult medical examination with abnormal findings: Secondary | ICD-10-CM | POA: Diagnosis not present

## 2021-11-27 DIAGNOSIS — Z1159 Encounter for screening for other viral diseases: Secondary | ICD-10-CM | POA: Diagnosis not present

## 2021-11-27 DIAGNOSIS — Z6838 Body mass index (BMI) 38.0-38.9, adult: Secondary | ICD-10-CM

## 2021-11-27 DIAGNOSIS — Z114 Encounter for screening for human immunodeficiency virus [HIV]: Secondary | ICD-10-CM | POA: Diagnosis not present

## 2021-11-27 MED ORDER — LISINOPRIL 20 MG PO TABS: 20.0000 mg | ORAL_TABLET | Freq: Every day | ORAL | 1 refills | Status: DC

## 2021-11-27 NOTE — Assessment & Plan Note (Signed)
Encourage diet and exercise for weight loss 

## 2021-11-27 NOTE — Patient Instructions (Signed)

## 2021-11-27 NOTE — Progress Notes (Signed)
Subjective:    Patient ID: Tamara Mcclain, female    DOB: [birth year], 21 y.o.   MRN: [id number]  HPI  Patient presents to clinic today for her annual exam.  Flu: 01/2019 Tetanus: 11/2020 COVID: Tamara Mcclain x2 Pap smear: 08/2020 Dentist: biannually  Diet: She does eat meat occasionally. She does not consume fruits or veggies. She does eat fried foods. She drinks mostly water, soda. Exercise: None  Review of Systems     Past Medical History:  Diagnosis Date   Anxiety    Epiploic appendagitis    MTHFR mutation    Von Willebrand disease (HCC) Dr. Janee Morn at [facility]    Current Outpatient Medications  Medication Sig Dispense Refill   albuterol (VENTOLIN HFA) 108 (90 Base) MCG/ACT inhaler Inhale 1-2 puffs into the lungs every 6 (six) hours as needed for wheezing or shortness of breath. 8 g 0   budesonide-formoterol (SYMBICORT) 160-4.5 MCG/ACT inhaler Inhale 2 puffs into the lungs in the morning and at bedtime. 10.2 g 5   ibuprofen (ADVIL) 800 MG tablet Take 800 mg by mouth 3 (three) times daily.     lisinopril (ZESTRIL) 10 MG tablet Take 1 tablet (10 mg total) by mouth daily. 30 tablet 0   Melatonin 10 MG CAPS Take by mouth.     No current facility-administered medications for this visit.    Allergies  Allergen Reactions   Tylenol [Acetaminophen] Other (See Comments)    fever   Depo-Medrol [Methylprednisolone] Nausea Only    Feeling flushed    Family History  Problem Relation Age of Onset   Arthritis Maternal Grandmother    Hyperlipidemia Maternal Grandmother    CAD Maternal Grandfather    Cancer Paternal Grandmother        melanoma, sinus cancer   Colon polyps Paternal Grandmother    Esophageal cancer Neg Hx    Stomach cancer Neg Hx    Liver disease Neg Hx     Social History   Socioeconomic History   Marital status: Single    Spouse name: Not on file   Number of children: Not on file   Years of education: Not on file   Highest education level: Not on file   Occupational History   Not on file  Tobacco Use   Smoking status: Never    Passive exposure: Past   Smokeless tobacco: Never  Vaping Use   Vaping Use: Never used  Substance and Sexual Activity   Alcohol use: No    Alcohol/week: 0.0 standard drinks of alcohol   Drug use: No   Sexual activity: Yes    Birth control/protection: None  Other Topics Concern   Not on file  Social History Narrative   Not on file   Social Determinants of Health   Financial Resource Strain: Not on file  Food Insecurity: Not on file  Transportation Needs: Not on file  Physical Activity: Not on file  Stress: Not on file  Social Connections: Not on file  Intimate Partner Violence: Not on file     Constitutional: Denies fever, malaise, fatigue, headache or abrupt weight changes.  HEENT: Denies eye pain, eye redness, ear pain, ringing in the ears, wax buildup, runny nose, nasal congestion, bloody nose, or sore throat. Respiratory: Denies difficulty breathing, shortness of breath, cough or sputum production.   Cardiovascular: Denies chest pain, chest tightness, palpitations or swelling in the hands or feet.  Gastrointestinal: Patient reports intermittent abdominal discomfort.  Denies bloating, constipation, diarrhea or blood in the  stool.  GU: Denies urgency, frequency, pain with urination, burning sensation, blood in urine, odor or discharge. Musculoskeletal: Denies decrease in range of motion, difficulty with gait, muscle pain or joint pain and swelling.  Skin: Denies redness, rashes, lesions or ulcercations.  Neurological: Denies dizziness, difficulty with memory, difficulty with speech or problems with balance and coordination.  Psych: Patient has a history of anxiety.  Denies depression, SI/HI.  No other specific complaints in a complete review of systems (except as listed in HPI above).  Objective:   Physical Exam  BP 131/88 (BP Location: Left Arm, Patient Position: Sitting, Cuff Size: Normal)    Pulse 85   Temp 97.8 F (36.6 C) (Temporal)   Ht 5' (1.524 m)   Wt 199 lb (90.3 kg)   SpO2 99%   BMI 38.86 kg/m   Wt Readings from Last 3 Encounters:  11/10/21 202 lb (91.6 kg)  10/06/21 200 lb (90.7 kg)  08/24/21 200 lb (90.7 kg)    General: Appears her stated age, obese, in NAD. Skin: Warm, dry and intact.  HEENT: Head: normal shape and size; Eyes: sclera white, no icterus, conjunctiva pink, PERRLA and EOMs intact;  Neck:  Neck supple, trachea midline. No masses, lumps or thyromegaly present.  Cardiovascular: Normal rate and rhythm. S1,S2 noted.  No murmur, rubs or gallops noted. No JVD or BLE edema.  Pulmonary/Chest: Normal effort and positive vesicular breath sounds. No respiratory distress. No wheezes, rales or ronchi noted.  Abdomen: Soft and nontender. Normal bowel sounds.  Musculoskeletal: Strength 5/5 BUE/BLE.  No difficulty with gait.  Neurological: Alert and oriented. Cranial nerves II-XII grossly intact. Coordination normal.  Psychiatric: Mood and affect normal. Behavior is normal. Judgment and thought content normal.    BMET    Component Value Date/Time   NA 137 10/06/2021 1243   K 3.5 10/06/2021 1243   CL 103 10/06/2021 1243   CO2 24 10/06/2021 1243   GLUCOSE 111 (H) 10/06/2021 1243   BUN 7 10/06/2021 1243   CREATININE 0.62 10/06/2021 1243   CALCIUM 9.2 10/06/2021 1243   GFRNONAA >60 10/06/2021 1243   GFRAA >60 04/09/2018 2151    Lipid Panel  No results found for: "CHOL", "TRIG", "HDL", "CHOLHDL", "VLDL", "LDLCALC"  CBC    Component Value Date/Time   WBC 9.7 10/06/2021 1243   RBC 5.16 (H) 10/06/2021 1243   HGB 13.8 10/06/2021 1243   HGB 14.1 05/18/2021 1126   HCT 41.8 10/06/2021 1243   HCT 42.5 05/18/2021 1126   PLT 360 10/06/2021 1243   PLT 366 05/18/2021 1126   MCV 81.0 10/06/2021 1243   MCV 82 05/18/2021 1126   MCH 26.7 10/06/2021 1243   MCHC 33.0 10/06/2021 1243   RDW 12.7 10/06/2021 1243   RDW 12.6 05/18/2021 1126   LYMPHSABS 1.3  07/24/2021 1612   MONOABS 0.6 07/24/2021 1612   EOSABS 0.2 07/24/2021 1612   BASOSABS 0.0 07/24/2021 1612    Hgb A1C No results found for: "HGBA1C"          Assessment & Plan:   Preventative Health Maintenance:  Encouraged her to get a flu shot in the fall Tetanus UTD Encouraged her to get a COVID booster Pap smear UTD Encouraged her to consume a balanced diet and exercise regimen We will check lipid, A1c, HIV and hep C today  RTC in 6 months, follow-up chronic conditions Nicki Reaper, NP

## 2021-11-28 LAB — LIPID PANEL
Cholesterol: 168 mg/dL (ref <200)
HDL: 32 mg/dL — ABNORMAL LOW (ref >=50)
LDL Cholesterol (Calc): 110 mg/dL (calc) — ABNORMAL HIGH
Non-HDL Cholesterol (Calc): 136 mg/dL (calc) — ABNORMAL HIGH (ref <130)
Total CHOL/HDL Ratio: 5.3 (calc) — ABNORMAL HIGH (ref <5.0)
Triglycerides: 145 mg/dL (ref <150)

## 2021-11-28 LAB — HEMOGLOBIN A1C
Hgb A1c MFr Bld: 5.3 % of total Hgb (ref <5.7)
Mean Plasma Glucose: 105 mg/dL
eAG (mmol/L): 5.8 mmol/L

## 2021-11-28 LAB — TEST AUTHORIZATION: TEST CODE:: 16802

## 2021-11-28 LAB — HIV ANTIBODY (ROUTINE TESTING W REFLEX): HIV 1&2 Ab, 4th Generation: NONREACTIVE

## 2021-11-28 LAB — HEPATITIS C ANTIBODY: Hepatitis C Ab: NONREACTIVE

## 2021-12-02 ENCOUNTER — Encounter: Payer: Self-pay | Admitting: Internal Medicine

## 2021-12-02 ENCOUNTER — Other Ambulatory Visit: Payer: Self-pay | Admitting: Internal Medicine

## 2021-12-03 NOTE — Telephone Encounter (Signed)
Requested medication (s) are due for refill today:no  Requested medication (s) are on the active medication list: no  Last refill:  11/27/21  Future visit scheduled: yes  Notes to clinic:  Unable to refill per protocol, Rx expired. Medication was discontinued 11/27/21 by PCP due to dosage change.     Requested Prescriptions  Pending Prescriptions Disp Refills   lisinopril (ZESTRIL) 10 MG tablet [Pharmacy Med Name: LISINOPRIL 10 MG TABLET] 30 tablet 0    Sig: TAKE 1 TABLET BY MOUTH EVERY DAY     Cardiovascular:  ACE Inhibitors Passed - 12/02/2021 11:33 AM      Passed - Cr in normal range and within 180 days    Creatinine, Ser  Date Value Ref Range Status  10/06/2021 0.62 0.44 - 1.00 mg/dL Final         Passed - K in normal range and within 180 days    Potassium  Date Value Ref Range Status  10/06/2021 3.5 3.5 - 5.1 mmol/L Final         Passed - Patient is not pregnant      Passed - Last BP in normal range    BP Readings from Last 1 Encounters:  11/27/21 131/88         Passed - Valid encounter within last 6 months    Recent Outpatient Visits           6 days ago Encounter for general adult medical examination with abnormal findings   [facility] [place], Salvadore Oxford, NP   3 weeks ago New daily persistent headache   [facility] [place], Salvadore Oxford, NP   4 months ago Acute viral syndrome   [facility] Smitty Cords, DO   1 year ago Viral gastroenteritis   [facility] [place], Salvadore Oxford, NP   1 year ago Other infective acute otitis externa of left ear   [facility] [place], Salvadore Oxford, NP       Future Appointments             In 1 week Sampson Si, Salvadore Oxford, NP [facility]   In 2 months Wyline Mood, MD [facility]

## 2021-12-15 ENCOUNTER — Ambulatory Visit: Payer: BC Managed Care – PPO | Admitting: Internal Medicine

## 2021-12-15 NOTE — Progress Notes (Deleted)
Subjective:    Patient ID: Tamara Mcclain, female    DOB: 2000/06/10, 21 y.o.   MRN: [id number]  HPI  Patient presents to the clinic today with complaint of.  She would like to be tested for ADHD.  Review of Systems     Past Medical History:  Diagnosis Date   Anxiety    Epiploic appendagitis    MTHFR mutation    Von Willebrand disease (HCC) Dr. Janee Morn at [facility]    Current Outpatient Medications  Medication Sig Dispense Refill   albuterol (VENTOLIN HFA) 108 (90 Base) MCG/ACT inhaler Inhale 1-2 puffs into the lungs every 6 (six) hours as needed for wheezing or shortness of breath. 8 g 0   budesonide-formoterol (SYMBICORT) 160-4.5 MCG/ACT inhaler Inhale 2 puffs into the lungs in the morning and at bedtime. 10.2 g 5   ibuprofen (ADVIL) 800 MG tablet Take 800 mg by mouth 3 (three) times daily.     lisinopril (ZESTRIL) 20 MG tablet Take 1 tablet (20 mg total) by mouth daily. 90 tablet 1   Melatonin 10 MG CAPS Take by mouth.     No current facility-administered medications for this visit.    Allergies  Allergen Reactions   Tylenol [Acetaminophen] Other (See Comments)    fever   Depo-Medrol [Methylprednisolone] Nausea Only    Feeling flushed    Family History  Problem Relation Age of Onset   Hypertension Father    Pulmonary embolism Father    Arthritis Maternal Grandmother    Hyperlipidemia Maternal Grandmother    CAD Maternal Grandfather    Cancer Paternal Grandmother        melanoma, sinus cancer   Colon polyps Paternal Grandmother    Gaucher's disease Half-Brother    Esophageal cancer Neg Hx    Stomach cancer Neg Hx    Liver disease Neg Hx     Social History   Socioeconomic History   Marital status: Single    Spouse name: Not on file   Number of children: Not on file   Years of education: Not on file   Highest education level: Not on file  Occupational History   Not on file  Tobacco Use   Smoking status: Never    Passive exposure: Past   Smokeless  tobacco: Never  Vaping Use   Vaping Use: Never used  Substance and Sexual Activity   Alcohol use: No    Alcohol/week: 0.0 standard drinks of alcohol   Drug use: No   Sexual activity: Yes    Birth control/protection: None  Other Topics Concern   Not on file  Social History Narrative   Not on file   Social Determinants of Health   Financial Resource Strain: Not on file  Food Insecurity: Not on file  Transportation Needs: Not on file  Physical Activity: Not on file  Stress: Not on file  Social Connections: Not on file  Intimate Partner Violence: Not on file     Constitutional: Denies fever, malaise, fatigue, headache or abrupt weight changes.  HEENT: Denies eye pain, eye redness, ear pain, ringing in the ears, wax buildup, runny nose, nasal congestion, bloody nose, or sore throat. Respiratory: Denies difficulty breathing, shortness of breath, cough or sputum production.   Cardiovascular: Denies chest pain, chest tightness, palpitations or swelling in the hands or feet.  Gastrointestinal: Denies abdominal pain, bloating, constipation, diarrhea or blood in the stool.  GU: Denies urgency, frequency, pain with urination, burning sensation, blood in urine, odor or discharge.  Musculoskeletal: Denies decrease in range of motion, difficulty with gait, muscle pain or joint pain and swelling.  Skin: Denies redness, rashes, lesions or ulcercations.  Neurological: Denies dizziness, difficulty with memory, difficulty with speech or problems with balance and coordination.  Psych: Patient has a history of anxiety.  Denies depression, SI/HI.  No other specific complaints in a complete review of systems (except as listed in HPI above).  Objective:   Physical Exam   There were no vitals taken for this visit. Wt Readings from Last 3 Encounters:  11/27/21 199 lb (90.3 kg)  11/10/21 202 lb (91.6 kg)  10/06/21 200 lb (90.7 kg)    General: Appears their stated age, well developed, well  nourished in NAD. Skin: Warm, dry and intact. No rashes, lesions or ulcerations noted. HEENT: Head: normal shape and size; Eyes: sclera white, no icterus, conjunctiva pink, PERRLA and EOMs intact; Ears: Tm's gray and intact, normal light reflex; Nose: mucosa pink and moist, septum midline; Throat/Mouth: Teeth present, mucosa pink and moist, no exudate, lesions or ulcerations noted.  Neck:  Neck supple, trachea midline. No masses, lumps or thyromegaly present.  Cardiovascular: Normal rate and rhythm. S1,S2 noted.  No murmur, rubs or gallops noted. No JVD or BLE edema. No carotid bruits noted. Pulmonary/Chest: Normal effort and positive vesicular breath sounds. No respiratory distress. No wheezes, rales or ronchi noted.  Abdomen: Soft and nontender. Normal bowel sounds. No distention or masses noted. Liver, spleen and kidneys non palpable. Musculoskeletal: Normal range of motion. No signs of joint swelling. No difficulty with gait.  Neurological: Alert and oriented. Cranial nerves II-XII grossly intact. Coordination normal.  Psychiatric: Mood and affect normal. Behavior is normal. Judgment and thought content normal.    BMET    Component Value Date/Time   NA 137 10/06/2021 1243   K 3.5 10/06/2021 1243   CL 103 10/06/2021 1243   CO2 24 10/06/2021 1243   GLUCOSE 111 (H) 10/06/2021 1243   BUN 7 10/06/2021 1243   CREATININE 0.62 10/06/2021 1243   CALCIUM 9.2 10/06/2021 1243   GFRNONAA >60 10/06/2021 1243   GFRAA >60 04/09/2018 2151    Lipid Panel     Component Value Date/Time   CHOL 168 11/27/2021 1428   TRIG 145 11/27/2021 1428   HDL 32 (L) 11/27/2021 1428   CHOLHDL 5.3 (H) 11/27/2021 1428   LDLCALC 110 (H) 11/27/2021 1428    CBC    Component Value Date/Time   WBC 9.7 10/06/2021 1243   RBC 5.16 (H) 10/06/2021 1243   HGB 13.8 10/06/2021 1243   HGB 14.1 05/18/2021 1126   HCT 41.8 10/06/2021 1243   HCT 42.5 05/18/2021 1126   PLT 360 10/06/2021 1243   PLT 366 05/18/2021 1126    MCV 81.0 10/06/2021 1243   MCV 82 05/18/2021 1126   MCH 26.7 10/06/2021 1243   MCHC 33.0 10/06/2021 1243   RDW 12.7 10/06/2021 1243   RDW 12.6 05/18/2021 1126   LYMPHSABS 1.3 07/24/2021 1612   MONOABS 0.6 07/24/2021 1612   EOSABS 0.2 07/24/2021 1612   BASOSABS 0.0 07/24/2021 1612    Hgb A1C Lab Results  Component Value Date   HGBA1C 5.3 11/27/2021           Assessment & Plan:     RTC in 5 months for follow-up of chronic conditions Nicki Reaper, NP

## 2021-12-19 ENCOUNTER — Ambulatory Visit (INDEPENDENT_AMBULATORY_CARE_PROVIDER_SITE_OTHER): Payer: BC Managed Care – PPO

## 2021-12-19 VITALS — BP 124/92 | Ht 60.0 in | Wt 197.6 lb

## 2021-12-19 DIAGNOSIS — Z3201 Encounter for pregnancy test, result positive: Secondary | ICD-10-CM | POA: Diagnosis not present

## 2021-12-19 LAB — POCT URINE PREGNANCY: Preg Test, Ur: POSITIVE — AB

## 2021-12-19 NOTE — Progress Notes (Signed)
Subjective:    Tamara Mcclain is a 21 y.o. female who presents for evaluation of amenorrhea. She believes she could be pregnant. Pregnancy is desired.  Last period was abnormal, patient describes flow as light and states that cycle was shorter.   No LMP recorded. The following portions of the patient's history were reviewed and updated as appropriate: allergies, current medications, past family history, past medical history, past social history, past surgical history, and problem list.   Lab Review Urine HCG: positive    Assessment:    Absence of menstruation.     Plan:    Pregnancy Test:  Positive: EDC: 08/20/2022. Briefly discussed positive results and sent to check out for scheduling for New OB appointments.

## 2021-12-19 NOTE — Patient Instructions (Signed)

## 2021-12-22 ENCOUNTER — Emergency Department
Admission: EM | Admit: 2021-12-22 | Discharge: 2021-12-22 | Discharge status: Against Medical Advice | Payer: BC Managed Care – PPO

## 2021-12-31 ENCOUNTER — Ambulatory Visit (INDEPENDENT_AMBULATORY_CARE_PROVIDER_SITE_OTHER): Payer: BC Managed Care – PPO

## 2021-12-31 DIAGNOSIS — Z369 Encounter for antenatal screening, unspecified: Secondary | ICD-10-CM

## 2021-12-31 DIAGNOSIS — Z3A00 Weeks of gestation of pregnancy not specified: Secondary | ICD-10-CM

## 2021-12-31 DIAGNOSIS — Z3480 Encounter for supervision of other normal pregnancy, unspecified trimester: Secondary | ICD-10-CM | POA: Insufficient documentation

## 2021-12-31 NOTE — Progress Notes (Signed)
New OB Intake  I connected with  [name] on 12/31/21 at  9:15 AM EDT by telephone and verified that I am speaking with the correct person using two identifiers. Nurse is located at [facility] and pt is located at home.  I explained I am completing New OB Intake today. We discussed her EDD of 08/28/22 that is based on u/s yesterday of [redacted]w[redacted]d. Pt is G3/P0020. I reviewed her allergies, medications, Medical/Surgical/OB history, and appropriate screenings. Based on history, this is a/an pregnancy uncomplicated .   Patient Active Problem List   Diagnosis Date Noted   Supervision of other normal pregnancy, antepartum 12/31/2021   Primary hypertension 11/10/2021   Class 2 obesity due to excess calories with body mass index (BMI) of 38.0 to 38.9 in adult 11/10/2021   Panic attacks 12/05/2019   Epiploic appendagitis 06/28/2019   Von Willebrand disease (HCC) 08/18/2016    Concerns addressed today Pt had u/s done yesterday at [facility]; isn't sure heartbeat seen and it wasn't heard; they are going to repeat it next week.    Delivery Plans:  Plans to deliver at [facility].  Anatomy US Explained first scheduled [place] will be around 20 weeks.   Labs Discussed genetic screening with patient. Patient desires genetic testing. Discussed possible labs to be drawn at new OB appointment.  COVID Vaccine Patient has had COVID vaccine.   Social Determinants of Health Food Insecurity: denies food insecurity Transportation: Patient denies transportation needs.  First visit review I reviewed new OB appt with pt. I explained she will have ob bloodwork and pap smear/pelvic exam if indicated. Explained pt will be seen by Carie Caddy, CNM at first visit; encounter routed to appropriate provider.   Loran Senters, [facility] 12/31/2021  9:41 AM  Clinical Staff Provider  Office Location  [facility] Dating    Language  English Anatomy US    Flu Vaccine  offer Genetic  Screen  NIPS:   TDaP vaccine   offer Hgb A1C or  GTT Early : Third trimester :   Covid One booster   LAB RESULTS   Rhogam   Blood Type --/--/A POS Performed at [facility], [address] [place], [place] [address]  [phone])   Feeding Plan breast Antibody    Contraception Undecided/none Rubella    Circumcision yes RPR     Pediatrician  [facility]     Support Person [name] HIV NON-REACTIVE (07/20 1428)  Prenatal Classes yes Varicella     GBS  (For PCN allergy, check sensitivities)   BTL Consent  Hep C   VBAC Consent  Pap      Hgb Electro      CF      SMA

## 2022-01-05 ENCOUNTER — Ambulatory Visit: Payer: BC Managed Care – PPO

## 2022-01-14 ENCOUNTER — Other Ambulatory Visit: Payer: BC Managed Care – PPO

## 2022-01-14 DIAGNOSIS — Z3480 Encounter for supervision of other normal pregnancy, unspecified trimester: Secondary | ICD-10-CM

## 2022-01-14 DIAGNOSIS — Z369 Encounter for antenatal screening, unspecified: Secondary | ICD-10-CM

## 2022-01-14 LAB — OB RESULTS CONSOLE VARICELLA ZOSTER ANTIBODY, IGG: Varicella: IMMUNE

## 2022-01-15 LAB — CBC/D/PLT+RPR+RH+ABO+RUBIGG...
Antibody Screen: NEGATIVE
Basophils Absolute: 0 10*3/uL (ref 0.0–0.2)
Basos: 0 %
EOS (ABSOLUTE): 0 10*3/uL (ref 0.0–0.4)
Eos: 0 %
HCV Ab: NONREACTIVE
HIV Screen 4th Generation wRfx: NONREACTIVE
Hematocrit: 41.8 % (ref 34.0–46.6)
Hemoglobin: 14 g/dL (ref 11.1–15.9)
Hepatitis B Surface Ag: NEGATIVE
Immature Grans (Abs): 0 10*3/uL (ref 0.0–0.1)
Immature Granulocytes: 0 %
Lymphocytes Absolute: 1.9 10*3/uL (ref 0.7–3.1)
Lymphs: 19 %
MCH: 28 pg (ref 26.6–33.0)
MCHC: 33.5 g/dL (ref 31.5–35.7)
MCV: 84 fL (ref 79–97)
Monocytes Absolute: 0.5 10*3/uL (ref 0.1–0.9)
Monocytes: 5 %
Neutrophils Absolute: 7.4 10*3/uL — ABNORMAL HIGH (ref 1.4–7.0)
Neutrophils: 76 %
Platelets: 328 10*3/uL (ref 150–450)
RBC: 5 x10E6/uL (ref 3.77–5.28)
RDW: 13.2 % (ref 11.7–15.4)
RPR Ser Ql: NONREACTIVE
Rh Factor: POSITIVE
Rubella Antibodies, IGG: 1.35 index (ref >0.99)
Varicella zoster IgG: 203 index (ref >165)
WBC: 9.9 10*3/uL (ref 3.4–10.8)

## 2022-01-15 LAB — HCV INTERPRETATION

## 2022-01-28 ENCOUNTER — Other Ambulatory Visit (HOSPITAL_COMMUNITY)
Admission: RE | Admit: 2022-01-28 | Discharge: 2022-01-28 | Disposition: A | Discharge status: Home / Self Care | Payer: BC Managed Care – PPO | Source: Ambulatory Visit | Attending: Licensed Practical Nurse | Admitting: Licensed Practical Nurse

## 2022-01-28 ENCOUNTER — Encounter: Payer: Self-pay | Admitting: Licensed Practical Nurse

## 2022-01-28 ENCOUNTER — Ambulatory Visit (INDEPENDENT_AMBULATORY_CARE_PROVIDER_SITE_OTHER): Payer: BC Managed Care – PPO | Admitting: Licensed Practical Nurse

## 2022-01-28 VITALS — BP 120/80 | Wt 192.0 lb

## 2022-01-28 DIAGNOSIS — Z6837 Body mass index (BMI) 37.0-37.9, adult: Secondary | ICD-10-CM

## 2022-01-28 DIAGNOSIS — D6800 Von Willebrand disease, unspecified: Secondary | ICD-10-CM

## 2022-01-28 DIAGNOSIS — I10 Essential (primary) hypertension: Secondary | ICD-10-CM

## 2022-01-28 DIAGNOSIS — O219 Vomiting of pregnancy, unspecified: Secondary | ICD-10-CM

## 2022-01-28 DIAGNOSIS — Z131 Encounter for screening for diabetes mellitus: Secondary | ICD-10-CM

## 2022-01-28 DIAGNOSIS — Z3480 Encounter for supervision of other normal pregnancy, unspecified trimester: Secondary | ICD-10-CM

## 2022-01-28 DIAGNOSIS — Z113 Encounter for screening for infections with a predominantly sexual mode of transmission: Secondary | ICD-10-CM

## 2022-01-28 MED ORDER — DOXYLAMINE-PYRIDOXINE 10-10 MG PO TBEC: 2.0000 | DELAYED_RELEASE_TABLET | Freq: Every day | ORAL | 5 refills | Status: DC

## 2022-01-28 NOTE — Progress Notes (Signed)
Routine Prenatal Care Visit  Subjective  Tamara Mcclain is a 21 y.o. G3P0020 at [redacted]w[redacted]d being seen today for ongoing prenatal care.  She is currently monitored for the following issues for this high-risk pregnancy and has Von Willebrand disease ([place]); Epiploic appendagitis; Panic attacks; Primary hypertension; Class 2 obesity due to excess calories with body mass index (BMI) of 38.0 to 38.9 in adult; and Supervision of other normal pregnancy, antepartum on their problem list.  ----------------------------------------------------------------------------------- Patient reports fatigue, nausea, and vomiting.  Has n ot tried much for the nausea, willing to try Diclegis  This was a planned pregnancy -Lives with her fiance feels safe -Currently unemployed but looking for a job  -Hx of Von Willebrand disease does not have a hematologist  -CHTN: was given medication but "it did not work" so her provider stopped it and did not prescribe another medication. BP normotensive today, states hr BP tends to increase when she is not feeling well.  -BMI 37, wt gain in pregnancy reviewed, encouraged regular physical actively, they go walking in their neighborhood -Consider starting baby ASA,  but will refer to MFM recommendations   .  .   . Leaking Fluid denies.  ----------------------------------------------------------------------------------- The following portions of the patient's history were reviewed and updated as appropriate: allergies, current medications, past family history, past medical history, past social history, past surgical history and problem list. Problem list updated.  Objective  Blood pressure 120/80, weight 192 lb (87.1 kg), last menstrual period 11/13/2021. Pregravid weight 190 lb (86.2 kg) Total Weight Gain 2 lb (0.907 kg) Urinalysis: Urine Protein    Urine Glucose    Fetal Status:          Thyroid, not enlarged, no nodules Breasts: soft, no redness or masses, nipples intact bilaterally   General:  Alert, oriented and cooperative. Patient is in no acute distress.  Skin: Skin is warm and dry. No rash noted.   Cardiovascular: Normal heart rate noted  Respiratory: Normal respiratory effort, no problems with respiration noted  Abdomen: Soft, gravid, appropriate for gestational age. Pain/Pressure: Present     Pelvic:  Cervical exam deferred        Extremities: Normal range of motion.  Edema: None  Mental Status: Normal mood and affect. Normal behavior. Normal judgment and thought content.   Assessment   21 y.o. G3P0020 at [redacted]w[redacted]d by  08/28/2022, by Ultrasound presenting for routine prenatal visit  Plan   third Problems (from 12/31/21 to present)     No problems associated with this episode.        general obstetric precautions including but not limited to vaginal bleeding, contractions, leaking of fluid and fetal movement were reviewed in detail with the patient. Please refer to After Visit Summary for other counseling recommendations.   Return in about 4 weeks (around 02/25/2022) for ROB. Referral for Hematologist and MFM placed  Desires genetic screening at next visit   Roberto Scales, [place], [facility]  01/28/22  12:54 PM

## 2022-01-29 ENCOUNTER — Other Ambulatory Visit: Payer: Self-pay

## 2022-01-29 DIAGNOSIS — Z6837 Body mass index (BMI) 37.0-37.9, adult: Secondary | ICD-10-CM

## 2022-01-29 DIAGNOSIS — Z369 Encounter for antenatal screening, unspecified: Secondary | ICD-10-CM

## 2022-01-29 DIAGNOSIS — D6800 Von Willebrand disease, unspecified: Secondary | ICD-10-CM

## 2022-01-29 LAB — COMPREHENSIVE METABOLIC PANEL
ALT: 10 IU/L (ref 0–32)
AST: 17 IU/L (ref 0–40)
Albumin/Globulin Ratio: 1.4 (ref 1.2–2.2)
Albumin: 4 g/dL (ref 4.0–5.0)
Alkaline Phosphatase: 85 IU/L (ref 44–121)
BUN/Creatinine Ratio: 5 — ABNORMAL LOW (ref 9–23)
BUN: 4 mg/dL — ABNORMAL LOW (ref 6–20)
Bilirubin Total: 1.1 mg/dL (ref 0.0–1.2)
CO2: 22 mmol/L (ref 20–29)
Calcium: 9.7 mg/dL (ref 8.7–10.2)
Chloride: 101 mmol/L (ref 96–106)
Creatinine, Ser: 0.8 mg/dL (ref 0.57–1.00)
Globulin, Total: 2.8 g/dL (ref 1.5–4.5)
Glucose: 84 mg/dL (ref 70–99)
Potassium: 4.2 mmol/L (ref 3.5–5.2)
Sodium: 136 mmol/L (ref 134–144)
Total Protein: 6.8 g/dL (ref 6.0–8.5)
eGFR: 107 mL/min/{1.73_m2} (ref >59)

## 2022-01-29 LAB — CERVICOVAGINAL ANCILLARY ONLY
Bacterial Vaginitis (gardnerella): NEGATIVE
Candida Glabrata: NEGATIVE
Candida Vaginitis: NEGATIVE
Chlamydia: NEGATIVE
Comment: NEGATIVE
Comment: NEGATIVE
Comment: NEGATIVE
Comment: NEGATIVE
Comment: NEGATIVE
Comment: NORMAL
Neisseria Gonorrhea: NEGATIVE
Trichomonas: NEGATIVE

## 2022-01-29 LAB — PROTEIN / CREATININE RATIO, URINE
Creatinine, Urine: 248.7 mg/dL
Protein, Ur: 59 mg/dL
Protein/Creat Ratio: 237 mg/g creat — ABNORMAL HIGH (ref 0–200)

## 2022-01-29 LAB — HEMOGLOBIN A1C
Est. average glucose Bld gHb Est-mCnc: 105 mg/dL
Hgb A1c MFr Bld: 5.3 % (ref 4.8–5.6)

## 2022-01-29 NOTE — Progress Notes (Signed)
[place] MFM ordered. Required for MFM consultation. Patient has not had a dating scan.

## 2022-01-30 LAB — URINE CULTURE

## 2022-02-02 ENCOUNTER — Other Ambulatory Visit: Payer: Self-pay

## 2022-02-02 ENCOUNTER — Emergency Department: Payer: BC Managed Care – PPO

## 2022-02-02 ENCOUNTER — Telehealth: Payer: Self-pay | Admitting: Obstetrics & Gynecology

## 2022-02-02 ENCOUNTER — Emergency Department
Admission: EM | Admit: 2022-02-02 | Discharge: 2022-02-02 | Disposition: A | Discharge status: Home / Self Care | Payer: BC Managed Care – PPO | Attending: Emergency Medicine | Admitting: Emergency Medicine

## 2022-02-02 DIAGNOSIS — O2341 Unspecified infection of urinary tract in pregnancy, first trimester: Secondary | ICD-10-CM | POA: Insufficient documentation

## 2022-02-02 DIAGNOSIS — O26891 Other specified pregnancy related conditions, first trimester: Secondary | ICD-10-CM | POA: Diagnosis present

## 2022-02-02 DIAGNOSIS — Z3A10 10 weeks gestation of pregnancy: Secondary | ICD-10-CM | POA: Diagnosis not present

## 2022-02-02 DIAGNOSIS — N390 Urinary tract infection, site not specified: Secondary | ICD-10-CM

## 2022-02-02 DIAGNOSIS — O26899 Other specified pregnancy related conditions, unspecified trimester: Secondary | ICD-10-CM

## 2022-02-02 LAB — CBC
HCT: 41 % (ref 36.0–46.0)
Hemoglobin: 14 g/dL (ref 12.0–15.0)
MCH: 27.8 pg (ref 26.0–34.0)
MCHC: 34.1 g/dL (ref 30.0–36.0)
MCV: 81.3 fL (ref 80.0–100.0)
Platelets: 308 10*3/uL (ref 150–400)
RBC: 5.04 MIL/uL (ref 3.87–5.11)
RDW: 12.9 % (ref 11.5–15.5)
WBC: 8.5 10*3/uL (ref 4.0–10.5)
nRBC: 0 % (ref 0.0–0.2)

## 2022-02-02 LAB — URINALYSIS, ROUTINE W REFLEX MICROSCOPIC
Bilirubin Urine: NEGATIVE
Glucose, UA: NEGATIVE mg/dL
Ketones, ur: NEGATIVE mg/dL
Nitrite: NEGATIVE
Protein, ur: 30 mg/dL — AB
Specific Gravity, Urine: 1.023 (ref 1.005–1.030)
pH: 7 (ref 5.0–8.0)

## 2022-02-02 LAB — COMPREHENSIVE METABOLIC PANEL
ALT: 15 U/L (ref 0–44)
AST: 16 U/L (ref 15–41)
Albumin: 3.6 g/dL (ref 3.5–5.0)
Alkaline Phosphatase: 70 U/L (ref 38–126)
Anion gap: 7 (ref 5–15)
BUN: 6 mg/dL (ref 6–20)
CO2: 23 mmol/L (ref 22–32)
Calcium: 9.4 mg/dL (ref 8.9–10.3)
Chloride: 107 mmol/L (ref 98–111)
Creatinine, Ser: 0.67 mg/dL (ref 0.44–1.00)
GFR, Estimated: >60 mL/min (ref >60)
Glucose, Bld: 95 mg/dL (ref 70–99)
Potassium: 3.9 mmol/L (ref 3.5–5.1)
Sodium: 137 mmol/L (ref 135–145)
Total Bilirubin: 1.1 mg/dL (ref 0.3–1.2)
Total Protein: 7.2 g/dL (ref 6.5–8.1)

## 2022-02-02 LAB — ABO/RH: ABO/RH(D): A POS

## 2022-02-02 LAB — LIPASE, BLOOD: Lipase: 25 U/L (ref 11–51)

## 2022-02-02 LAB — HCG, QUANTITATIVE, PREGNANCY: hCG, Beta Chain, Quant, S: 93852 m[IU]/mL — ABNORMAL HIGH (ref <5)

## 2022-02-02 MED ORDER — CEPHALEXIN 500 MG PO CAPS: 500.0000 mg | ORAL_CAPSULE | Freq: Three times a day (TID) | ORAL | 0 refills | Status: DC

## 2022-02-02 NOTE — ED Notes (Signed)
Right lower quad pain and flank pain--sharp like a knife--tender to palpation.  Says she is [redacted] weeks pregnant.  Denies vaginal bleeding or discharge.  No problems with urnination.

## 2022-02-02 NOTE — ED Notes (Signed)
See triage note  Presents with some abd discomfort  States she is about [redacted] weeks pregnant   Tenderness to right side of abd

## 2022-02-02 NOTE — Discharge Instructions (Addendum)
Follow-up with your regular OB/GYN doctor.  Please call for an appointment.  Return emergency department worsening.  Take the antibiotic as prescribed.

## 2022-02-02 NOTE — Telephone Encounter (Signed)
Patient is scheduled to see CJE on 02/24/22 at 10:15. Left message for patient to call office back to r/s

## 2022-02-02 NOTE — ED Triage Notes (Addendum)
Pt arrives with c/o RLQ ABD pain that started last night. Pt endorses n/v. Pt is [redacted] weeks pregnant. Pt denies vaginal bleeding.

## 2022-02-02 NOTE — ED Provider Notes (Signed)
[facility] Provider Note    Event Date/Time   First MD Initiated Contact with Patient 02/02/22 1033     (approximate)   History   Abdominal Pain   HPI  Tamara Mcclain is a 21 y.o. female with history of von Willebrand's, panic attacks, and 10-week pregnancy presents emergency department right lower quadrant pain.  Patient states that she thinks she is about [redacted] weeks pregnant.  Has been off of her fertility treatment for close to 3 months.  States they did multiple ultrasounds before releasing her to her OB/GYN.  Right lower quadrant pain started last night.  She has nausea related to the pregnancy that has not changed.  No diarrhea.  No fever or chills.  No burning with urination.      Physical Exam   Triage Vital Signs: ED Triage Vitals  Enc Vitals Group     BP 02/02/22 0959 128/84     Pulse Rate 02/02/22 0959 85     Resp 02/02/22 0959 16     Temp 02/02/22 0959 98.4 F (36.9 C)     Temp src --      SpO2 02/02/22 0959 97 %     Weight 02/02/22 1000 192 lb (87.1 kg)     Height 02/02/22 1000 5' (1.524 m)     Head Circumference --      Peak Flow --      Pain Score 02/02/22 0959 9     Pain Loc --      Pain Edu? --      Excl. in GC? --     Most recent vital signs: Vitals:   02/02/22 1307 02/02/22 1406  BP: 120/80 120/78  Pulse: 80 80  Resp: 16 16  Temp:  98 F (36.7 C)  SpO2: 98% 98%     General: Awake, no distress.   CV:  Good peripheral perfusion. regular rate and  rhythm Resp:  Normal effort. Lungs CTA Abd:  No distention.  Tender in right lower quadrant Other:      ED Results / Procedures / Treatments   Labs (all labs ordered are listed, but only abnormal results are displayed) Labs Reviewed  URINALYSIS, ROUTINE W REFLEX MICROSCOPIC - Abnormal; Notable for the following components:      Result Value   Color, Urine AMBER (*)    APPearance CLOUDY (*)    Hgb urine dipstick SMALL (*)    Protein, ur 30 (*)    Leukocytes,Ua  MODERATE (*)    Bacteria, UA MANY (*)    All other components within normal limits  HCG, QUANTITATIVE, PREGNANCY - Abnormal; Notable for the following components:   hCG, Beta Chain, Quant, S [id number] (*)    All other components within normal limits  LIPASE, BLOOD  COMPREHENSIVE METABOLIC PANEL  CBC  POC URINE PREG, ED  ABO/RH     EKG     RADIOLOGY Sound OB less than 14 weeks with transvaginal and Doppler    PROCEDURES:   Procedures   MEDICATIONS ORDERED IN ED: Medications - No data to display   IMPRESSION / MDM / ASSESSMENT AND PLAN / ED COURSE  I reviewed the triage vital signs and the nursing notes.                              Differential diagnosis includes, but is not limited to, ectopic, ovarian cyst, ruptured ovarian cyst, acute appendicitis  Patient's presentation is most consistent with acute presentation with potential threat to life or bodily function.   Labs and imaging ordered  Patient's labs are reassuring, beta-hCG is 93,852, urinalysis does show concerns for infection with moderate leuks and many bacteria.  We will treat her with an antibiotic, Keflex 3 times daily for 7 days.  Remainder of her labs are reassuring and negative.  Ultrasound OB less than 14 weeks shows an IUP of 10 weeks with heart rate of 162, this was individually reviewed and interpreted by me.  MRI of the abdomen and pelvis without contrast to assess for appendicitis.  Patient's been very tender right lower quadrant and pain were reproduced with walking.  MRI of the abdomen/pelvis independently reviewed and interpreted by me as being negative for acute appendicitis.  Confirmed by radiology  I did explain all the findings to the patient.  Explained to her that may be where the uterus is expanding and the ligament is pulling.  She is to take the antibiotic for the UTI.  Follow-up with her GYN doctor.  Return emergency department worsening.  Discharged in stable condition in the care  of her friend.  She is in agreement treatment plan.      FINAL CLINICAL IMPRESSION(S) / ED DIAGNOSES   Final diagnoses:  Pelvic pain in pregnancy  Acute UTI     Rx / DC Orders   ED Discharge Orders          Ordered    cephALEXin (KEFLEX) 500 MG capsule  3 times daily        02/02/22 1444             Note:  This document was prepared using Dragon voice recognition software and may include unintentional dictation errors.    Versie Starks, PA-C 02/02/22 1449    Rada Hay, MD 02/02/22 [phone]

## 2022-02-06 ENCOUNTER — Inpatient Hospital Stay: Payer: BC Managed Care – PPO | Attending: Oncology | Admitting: Oncology

## 2022-02-06 ENCOUNTER — Inpatient Hospital Stay: Payer: BC Managed Care – PPO

## 2022-02-06 ENCOUNTER — Encounter: Payer: Self-pay | Admitting: Oncology

## 2022-02-06 VITALS — BP 121/93 | HR 96 | Temp 96.6°F | Resp 20 | Wt 190.8 lb

## 2022-02-06 DIAGNOSIS — D6800 Von Willebrand disease, unspecified: Secondary | ICD-10-CM | POA: Diagnosis not present

## 2022-02-06 DIAGNOSIS — R799 Abnormal finding of blood chemistry, unspecified: Secondary | ICD-10-CM

## 2022-02-06 DIAGNOSIS — O99111 Other diseases of the blood and blood-forming organs and certain disorders involving the immune mechanism complicating pregnancy, first trimester: Secondary | ICD-10-CM | POA: Insufficient documentation

## 2022-02-06 DIAGNOSIS — Z3A11 11 weeks gestation of pregnancy: Secondary | ICD-10-CM | POA: Diagnosis not present

## 2022-02-06 NOTE — Progress Notes (Signed)
Hematology/Oncology Consult note [phone] [phone]         Patient Care Team: Lorre Munroe, NP as PCP - General (Internal Medicine)  REFERRING PROVIDER: Nevin Bloodgood*   CHIEF COMPLAINTS/REASON FOR VISIT:  Evaluation of von Willebrand disease  HISTORY OF PRESENTING ILLNESS:   Tamara Mcclain is a  21 y.o.  female with PMH listed below was seen in consultation at the request of  Dominic, Courtney Heys, C*  for evaluation of von Willebrand disease Patient is currently pregnant, 11 weeks Patient reports history of von Willebrand disease which was diagnosed at [facility] in 2014.  She had heavy menstrual period when she was 21 years of age.  Extensive medical records review in care everywhere was performed by me.  02/16/2013 patient was seen by Dr. Haydee Salter at [facility] for menorrhagia.  Patient did not have prior history of bleeding symptoms. denies increase in bruising or other type of bleeding such as epistaxis, gum bleeds or hematuria. She had several teeth extractions and family can not recall complications due to bleeding  02/16/2013  PT and PTT were both within normal limits.  Normal platelet function testing.  Von Willebrand panels were sent. von Willebrand factor antigen 134 [47-157%], von Willebrand factor activity 140 [50-168%], factor VIII activity 180 [54-161%].  Multimer was not indicated.  Antithrombin III activity 126 [83-123%] patient also had hypercoagulable work-up which were negative [normal protein S activity, protein S antigen, normal protein C activity, negative anticardiolipin antibodies, negative beta-2 glycoprotein antibodies.  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 Patient was recommended to start on OCP which she continued use until 2020. 03/16/2013, patient had a follow-up visit.  There was no documentation of official von Willebrand disease in her  note.  The test result is not consistent with von Willebrand disease.  Patient reports that she was told by her parents that she has "von Willebrand disease". Currently pregnant with her first child. Denies any easy bruising or active bleeding events.   MEDICAL HISTORY:  Past Medical History:  Diagnosis Date   Anxiety    Epiploic appendagitis    MTHFR mutation    Von Willebrand disease (HCC) Dr. Janee Morn at [facility]    SURGICAL HISTORY: Past Surgical History:  Procedure Laterality Date   ESOPHAGOGASTRODUODENOSCOPY (EGD) WITH PROPOFOL N/A 01/30/2020   Procedure: ESOPHAGOGASTRODUODENOSCOPY (EGD) WITH PROPOFOL;  Surgeon: Wyline Mood, MD;  Location: [facility] ENDOSCOPY;  Service: Gastroenterology;  Laterality: N/A;   WISDOM TOOTH EXTRACTION  08/2018   no bleeding complications    SOCIAL HISTORY: Social History   Socioeconomic History   Marital status: Single    Spouse name: Not on file   Number of children: 0   Years of education: 12   Highest education level: Not on file  Occupational History   Occupation: unemployed; [employer]  Tobacco Use   Smoking status: Never    Passive exposure: Past   Smokeless tobacco: Never  Vaping Use   Vaping Use: Never used  Substance and Sexual Activity   Alcohol use: No    Alcohol/week: 0.0 standard drinks of alcohol   Drug use: No   Sexual activity: Yes    Partners: Male    Birth control/protection: None  Other Topics Concern   Not on file  Social History Narrative   Not on file   Social Determinants of Health   Financial Resource Strain: Low Risk  (12/31/2021)  Overall Financial Resource Strain (CARDIA)    Difficulty of Paying Living Expenses: Not hard at all  Food Insecurity: Not on file  Transportation Needs: No Transportation Needs (12/31/2021)   PRAPARE - Transportation    Lack of Transportation (Medical): No    Lack of Transportation (Non-Medical): No  Physical Activity: Sufficiently Active (12/31/2021)   Exercise Vital Sign     Days of Exercise per Week: 4 days    Minutes of Exercise per Session: 150+ min  Stress: No Stress Concern Present (12/31/2021)   [place]    Feeling of Stress : Not at all  Social Connections: Moderately Integrated (12/31/2021)   Social Connection and Isolation Panel [NHANES]    Frequency of Communication with Friends and Family: More than three times a week    Frequency of Social Gatherings with Friends and Family: Three times a week    Attends Religious Services: More than 4 times per year    Active Member of Clubs or Organizations: No    Attends Archivist Meetings: Never    Marital Status: Living with partner  Intimate Partner Violence: Not At Risk (12/31/2021)   Humiliation, Afraid, Rape, and Kick questionnaire    Fear of Current or Ex-Partner: No    Emotionally Abused: No    Physically Abused: No    Sexually Abused: No    FAMILY HISTORY: Family History  Problem Relation Age of Onset   Hypertension Father    Pulmonary embolism Father    Cancer Maternal Grandmother 59       breast   Arthritis Maternal Grandmother    Hyperlipidemia Maternal Grandmother    CAD Maternal Grandfather    Cancer Paternal Grandmother        melanoma, sinus cancer   Colon polyps Paternal Grandmother    Heart disease Paternal Grandfather    Gaucher's disease Half-Brother    Esophageal cancer Neg Hx    Stomach cancer Neg Hx    Liver disease Neg Hx     ALLERGIES:  is allergic to tylenol [acetaminophen] and depo-medrol [methylprednisolone].  MEDICATIONS:  Current Outpatient Medications  Medication Sig Dispense Refill   albuterol (VENTOLIN HFA) 108 (90 Base) MCG/ACT inhaler Inhale 1-2 puffs into the lungs every 6 (six) hours as needed for wheezing or shortness of breath. 8 g 0   budesonide-formoterol (SYMBICORT) 160-4.5 MCG/ACT inhaler Inhale 2 puffs into the lungs in the morning and at bedtime. 10.2 g 5   Prenatal Vit-Fe  Fumarate-FA (PRENATAL VITAMINS) 28-0.8 MG TABS Take 1 tablet by mouth daily.     cephALEXin (KEFLEX) 500 MG capsule Take 1 capsule (500 mg total) by mouth 3 (three) times daily. 21 capsule 0   Doxylamine-Pyridoxine (DICLEGIS) 10-10 MG TBEC Take 2 tablets by mouth at bedtime. If symptoms persist, add one tablet in the morning and one in the afternoon 100 tablet 5   lisinopril (ZESTRIL) 20 MG tablet Take 1 tablet (20 mg total) by mouth daily. (Patient not taking: Reported on 01/28/2022) 90 tablet 1   No current facility-administered medications for this visit.    Review of Systems  Constitutional:  Negative for appetite change, chills, fatigue and fever.  HENT:   Negative for hearing loss and voice change.   Eyes:  Negative for eye problems.  Respiratory:  Negative for chest tightness and cough.   Cardiovascular:  Negative for chest pain.  Gastrointestinal:  Negative for abdominal distention, abdominal pain and blood in stool.  Endocrine:  Negative for hot flashes.  Genitourinary:  Negative for difficulty urinating and frequency.   Musculoskeletal:  Negative for arthralgias.  Skin:  Negative for itching and rash.  Neurological:  Negative for extremity weakness.  Hematological:  Negative for adenopathy.  Psychiatric/Behavioral:  Negative for confusion.    PHYSICAL EXAMINATION: ECOG PERFORMANCE STATUS: 0 - Asymptomatic Vitals:   02/06/22 1107  BP: (!) 121/93  Pulse: 96  Resp: 20  Temp: (!) 96.6 F (35.9 C)  SpO2: 100%   Filed Weights   02/06/22 1107  Weight: 190 lb 12.8 oz (86.5 kg)    Physical Exam Constitutional:      General: She is not in acute distress. HENT:     Head: Normocephalic and atraumatic.  Eyes:     General: No scleral icterus. Cardiovascular:     Rate and Rhythm: Normal rate.  Pulmonary:     Effort: Pulmonary effort is normal. No respiratory distress.  Abdominal:     General: There is no distension.  Musculoskeletal:        General: No deformity. Normal  range of motion.     Cervical back: Normal range of motion and neck supple.  Skin:    Findings: No erythema.  Neurological:     Mental Status: She is alert and oriented to person, place, and time. Mental status is at baseline.  Psychiatric:        Mood and Affect: Mood normal.     LABORATORY DATA:  I have reviewed the data as listed    Latest Ref Rng & Units 02/02/2022   10:02 AM 01/14/2022    2:13 PM 10/06/2021   12:43 PM  CBC  WBC 4.0 - 10.5 K/uL 8.5  9.9  9.7   Hemoglobin 12.0 - 15.0 g/dL 14.0  14.0  13.8   Hematocrit 36.0 - 46.0 % 41.0  41.8  41.8   Platelets 150 - 400 K/uL 308  328  360       Latest Ref Rng & Units 02/02/2022   10:02 AM 01/28/2022   10:47 AM 10/06/2021   12:43 PM  CMP  Glucose 70 - 99 mg/dL 95  84  111   BUN 6 - 20 mg/dL 6  4  7    Creatinine 0.44 - 1.00 mg/dL 0.67  0.80  0.62   Sodium 135 - 145 mmol/L 137  136  137   Potassium 3.5 - 5.1 mmol/L 3.9  4.2  3.5   Chloride 98 - 111 mmol/L 107  101  103   CO2 22 - 32 mmol/L 23  22  24    Calcium 8.9 - 10.3 mg/dL 9.4  9.7  9.2   Total Protein 6.5 - 8.1 g/dL 7.2  6.8    Total Bilirubin 0.3 - 1.2 mg/dL 1.1  1.1    Alkaline Phos 38 - 126 U/L 70  85    AST 15 - 41 U/L 16  17    ALT 0 - 44 U/L 15  10        RADIOGRAPHIC STUDIES: I have personally reviewed the radiological images as listed and agreed with the findings in the report. MR ABDOMEN WO CONTRAST  Result Date: 02/02/2022 CLINICAL DATA:  Right lower quadrant pain in pregnancy. EXAM: MRI ABDOMEN AND PELVIS WITHOUT CONTRAST TECHNIQUE: Multiplanar multisequence MR imaging of the abdomen and pelvis was performed. No intravenous contrast was administered. COMPARISON:  CT April 18, 2018 FINDINGS: COMBINED FINDINGS FOR BOTH MR ABDOMEN AND PELVIS Lower chest: No acute findings.  Hepatobiliary: Unremarkable noncontrast enhanced appearance of the hepatic parenchyma. Gallbladder is unremarkable. No biliary ductal dilation. Pancreas: No pancreatic ductal dilation or  evidence of acute inflammation. Spleen:  No splenomegaly. Adrenals/Urinary Tract: Bilateral adrenal glands appear normal. No hydronephrosis. Stomach/Bowel: Stomach is unremarkable for degree of distension. No pathologic dilation of small or large bowel. Normal appendix for instance on images 48 and 49/13. No evidence of acute bowel inflammation. Vascular/Lymphatic: Normal caliber abdominal aorta. No pathologically enlarged abdominal or pelvic lymph nodes. Reproductive: Ovaries appear normal. Single intrauterine pregnancy, evaluation of which is limited by protocol selection/technique. Other:  No significant abdominopelvic free fluid. Musculoskeletal: No suspicious bone lesions identified. IMPRESSION: 1. No acute abnormality identified in the abdomen or pelvis. Specifically, normal appendix. 2. Single intrauterine pregnancy, evaluation of which is limited by protocol selection/technique. Please see same-day pelvic ultrasound for better and more complete evaluation of this. Electronically Signed   By: Dahlia Bailiff M.D.   On: 02/02/2022 14:36   MR PELVIS WO CONTRAST  Result Date: 02/02/2022 CLINICAL DATA:  Right lower quadrant pain in pregnancy. EXAM: MRI ABDOMEN AND PELVIS WITHOUT CONTRAST TECHNIQUE: Multiplanar multisequence MR imaging of the abdomen and pelvis was performed. No intravenous contrast was administered. COMPARISON:  CT April 18, 2018 FINDINGS: COMBINED FINDINGS FOR BOTH MR ABDOMEN AND PELVIS Lower chest: No acute findings. Hepatobiliary: Unremarkable noncontrast enhanced appearance of the hepatic parenchyma. Gallbladder is unremarkable. No biliary ductal dilation. Pancreas: No pancreatic ductal dilation or evidence of acute inflammation. Spleen:  No splenomegaly. Adrenals/Urinary Tract: Bilateral adrenal glands appear normal. No hydronephrosis. Stomach/Bowel: Stomach is unremarkable for degree of distension. No pathologic dilation of small or large bowel. Normal appendix for instance on images  48 and 49/13. No evidence of acute bowel inflammation. Vascular/Lymphatic: Normal caliber abdominal aorta. No pathologically enlarged abdominal or pelvic lymph nodes. Reproductive: Ovaries appear normal. Single intrauterine pregnancy, evaluation of which is limited by protocol selection/technique. Other:  No significant abdominopelvic free fluid. Musculoskeletal: No suspicious bone lesions identified. IMPRESSION: 1. No acute abnormality identified in the abdomen or pelvis. Specifically, normal appendix. 2. Single intrauterine pregnancy, evaluation of which is limited by protocol selection/technique. Please see same-day pelvic ultrasound for better and more complete evaluation of this. Electronically Signed   By: Dahlia Bailiff M.D.   On: 02/02/2022 14:36   US OB LESS THAN 14 WEEKS WITH OB TRANSVAGINAL  Result Date: 02/02/2022 CLINICAL DATA:  Right lower quadrant pain since last night EXAM: OBSTETRIC <14 WK ULTRASOUND TECHNIQUE: Transabdominal ultrasound was performed for evaluation of the gestation as well as the maternal uterus and adnexal regions. COMPARISON:  None Available. FINDINGS: Intrauterine gestational sac: Single Yolk sac:  Visualized. Embryo:  Visualized. Cardiac Activity: Visualized. Heart Rate: 162 bpm CRL: 34.2 mm   10 w 2 d                  [place] EDC: 08/28/2021 Subchorionic hemorrhage:  None visualized. Maternal uterus/adnexae: Ovaries are normal. There is no free fluid. IMPRESSION: Single live intrauterine pregnancy with an estimated gestational age of [redacted] weeks 2 days by crown-rump length. Electronically Signed   By: Valetta Mole M.D.   On: 02/02/2022 12:55       ASSESSMENT & PLAN:   Pregnancy with 11 completed weeks gestation I discussed with the patient that her previous baseline von Willebrand testing was not consistent with von Willebrand disease.  She also does not have any history of excessive bleeding history.  Menorrhagia was common during the first year of menarche. Pregnancy  may  increase von Willebrand levels.  Recommend to check von Willebrand panel today.  If normal, recommend patient to follow-up a few weeks prior to her due date with repeat von Willebrand testing.  She agrees with the plan.   Orders Placed This Encounter  Procedures   Von Willebrand panel    Standing Status:   Future    Number of Occurrences:   1    Standing Expiration Date:   02/07/2023    All questions were answered. The patient knows to call the clinic with any problems, questions or concerns.  Allen Derry, C*  Follow-up To be determined. Thank you for this kind referral and the opportunity to participate in the care of this patient. A copy of today's note is routed to referring provider   Earlie Server, MD, PhD [facility] Hematology Oncology 02/06/2022

## 2022-02-06 NOTE — Assessment & Plan Note (Signed)
I discussed with the patient that her previous baseline von Willebrand testing was not consistent with von Willebrand disease.  She also does not have any history of excessive bleeding history.  Menorrhagia was common during the first year of menarche. Pregnancy may increase von Willebrand levels.  Recommend to check von Willebrand panel today.  If normal, recommend patient to follow-up a few weeks prior to her due date with repeat von Willebrand testing.  She agrees with the plan.

## 2022-02-07 LAB — VON WILLEBRAND PANEL
Coagulation Factor VIII: 128 % (ref 56–140)
Ristocetin Co-factor, Plasma: 117 % (ref 50–200)
Von Willebrand Antigen, Plasma: 138 % (ref 50–200)

## 2022-02-07 LAB — COAG STUDIES INTERP REPORT

## 2022-02-08 ENCOUNTER — Other Ambulatory Visit: Payer: Self-pay | Admitting: Oncology

## 2022-02-08 DIAGNOSIS — R799 Abnormal finding of blood chemistry, unspecified: Secondary | ICD-10-CM

## 2022-02-09 ENCOUNTER — Telehealth: Payer: Self-pay

## 2022-02-09 NOTE — Telephone Encounter (Signed)
-----   Message from Earlie Server, MD sent at 02/08/2022  4:36 PM EDT ----- Let her know that von willebrand levels are normal.  Please arrange patient to have labs done late march and see me 1 weeks after. Thanks.  Labs are ordered

## 2022-02-09 NOTE — Telephone Encounter (Signed)
Unable to reach pt by phone,detailed VM left on phone and Mychart message sent to inform pt of results.   Please schedule patient for labs in late March MD 1 week after labs.

## 2022-02-10 ENCOUNTER — Ambulatory Visit: Payer: BC Managed Care – PPO | Admitting: Internal Medicine

## 2022-02-10 ENCOUNTER — Encounter (HOSPITAL_BASED_OUTPATIENT_CLINIC_OR_DEPARTMENT_OTHER): Payer: Self-pay

## 2022-02-10 ENCOUNTER — Other Ambulatory Visit: Payer: Self-pay

## 2022-02-10 ENCOUNTER — Telehealth: Payer: Self-pay

## 2022-02-10 ENCOUNTER — Encounter: Payer: Self-pay | Admitting: Internal Medicine

## 2022-02-10 ENCOUNTER — Emergency Department (HOSPITAL_BASED_OUTPATIENT_CLINIC_OR_DEPARTMENT_OTHER)
Admission: EM | Admit: 2022-02-10 | Discharge: 2022-02-10 | Disposition: A | Discharge status: Home / Self Care | Payer: BC Managed Care – PPO | Attending: Emergency Medicine | Admitting: Emergency Medicine

## 2022-02-10 VITALS — BP 120/68 | HR 115 | Temp 97.9°F | Resp 18 | Wt 190.0 lb

## 2022-02-10 DIAGNOSIS — J4520 Mild intermittent asthma, uncomplicated: Secondary | ICD-10-CM

## 2022-02-10 DIAGNOSIS — D72829 Elevated white blood cell count, unspecified: Secondary | ICD-10-CM | POA: Insufficient documentation

## 2022-02-10 DIAGNOSIS — O211 Hyperemesis gravidarum with metabolic disturbance: Secondary | ICD-10-CM | POA: Diagnosis not present

## 2022-02-10 DIAGNOSIS — J029 Acute pharyngitis, unspecified: Secondary | ICD-10-CM

## 2022-02-10 DIAGNOSIS — Z3A12 12 weeks gestation of pregnancy: Secondary | ICD-10-CM | POA: Diagnosis not present

## 2022-02-10 DIAGNOSIS — O99511 Diseases of the respiratory system complicating pregnancy, first trimester: Secondary | ICD-10-CM | POA: Diagnosis not present

## 2022-02-10 DIAGNOSIS — O219 Vomiting of pregnancy, unspecified: Secondary | ICD-10-CM

## 2022-02-10 DIAGNOSIS — E860 Dehydration: Secondary | ICD-10-CM | POA: Insufficient documentation

## 2022-02-10 DIAGNOSIS — J45909 Unspecified asthma, uncomplicated: Secondary | ICD-10-CM | POA: Insufficient documentation

## 2022-02-10 DIAGNOSIS — Z7951 Long term (current) use of inhaled steroids: Secondary | ICD-10-CM | POA: Diagnosis not present

## 2022-02-10 DIAGNOSIS — K6389 Other specified diseases of intestine: Secondary | ICD-10-CM

## 2022-02-10 DIAGNOSIS — R1111 Vomiting without nausea: Secondary | ICD-10-CM

## 2022-02-10 DIAGNOSIS — O210 Mild hyperemesis gravidarum: Secondary | ICD-10-CM

## 2022-02-10 DIAGNOSIS — F410 Panic disorder [episodic paroxysmal anxiety] without agoraphobia: Secondary | ICD-10-CM

## 2022-02-10 DIAGNOSIS — R0602 Shortness of breath: Secondary | ICD-10-CM

## 2022-02-10 DIAGNOSIS — O99111 Other diseases of the blood and blood-forming organs and certain disorders involving the immune mechanism complicating pregnancy, first trimester: Secondary | ICD-10-CM | POA: Insufficient documentation

## 2022-02-10 DIAGNOSIS — Z6837 Body mass index (BMI) 37.0-37.9, adult: Secondary | ICD-10-CM

## 2022-02-10 DIAGNOSIS — R0981 Nasal congestion: Secondary | ICD-10-CM

## 2022-02-10 LAB — COMPREHENSIVE METABOLIC PANEL
ALT: 8 U/L (ref 0–44)
AST: 14 U/L — ABNORMAL LOW (ref 15–41)
Albumin: 4.3 g/dL (ref 3.5–5.0)
Alkaline Phosphatase: 73 U/L (ref 38–126)
Anion gap: 14 (ref 5–15)
BUN: <5 mg/dL — ABNORMAL LOW (ref 6–20)
CO2: 21 mmol/L — ABNORMAL LOW (ref 22–32)
Calcium: 9.9 mg/dL (ref 8.9–10.3)
Chloride: 102 mmol/L (ref 98–111)
Creatinine, Ser: 0.65 mg/dL (ref 0.44–1.00)
GFR, Estimated: >60 mL/min (ref >60)
Glucose, Bld: 97 mg/dL (ref 70–99)
Potassium: 3.7 mmol/L (ref 3.5–5.1)
Sodium: 137 mmol/L (ref 135–145)
Total Bilirubin: 1.4 mg/dL — ABNORMAL HIGH (ref 0.3–1.2)
Total Protein: 7.7 g/dL (ref 6.5–8.1)

## 2022-02-10 LAB — CBC
HCT: 40.6 % (ref 36.0–46.0)
Hemoglobin: 14 g/dL (ref 12.0–15.0)
MCH: 27.9 pg (ref 26.0–34.0)
MCHC: 34.5 g/dL (ref 30.0–36.0)
MCV: 80.9 fL (ref 80.0–100.0)
Platelets: 300 10*3/uL (ref 150–400)
RBC: 5.02 MIL/uL (ref 3.87–5.11)
RDW: 12.9 % (ref 11.5–15.5)
WBC: 13.5 10*3/uL — ABNORMAL HIGH (ref 4.0–10.5)
nRBC: 0 % (ref 0.0–0.2)

## 2022-02-10 LAB — LIPASE, BLOOD: Lipase: <10 U/L — ABNORMAL LOW (ref 11–51)

## 2022-02-10 MED ORDER — DOXYLAMINE-PYRIDOXINE 10-10 MG PO TBEC: 2.0000 | DELAYED_RELEASE_TABLET | Freq: Every day | ORAL | 5 refills | Status: DC

## 2022-02-10 MED ORDER — ONDANSETRON HCL 4 MG/2ML IJ SOLN
4.0000 mg | Freq: Once | INTRAMUSCULAR | Status: AC
  Administered 2022-02-10: 4 mg via INTRAVENOUS

## 2022-02-10 MED ORDER — DOXYLAMINE-PYRIDOXINE 10-10 MG PO TBEC: 2.0000 | DELAYED_RELEASE_TABLET | Freq: Every evening | ORAL | 0 refills | Status: DC | PRN

## 2022-02-10 MED ORDER — ONDANSETRON HCL 4 MG/2ML IJ SOLN
INTRAMUSCULAR | Status: AC
  Filled 2022-02-10: qty 2

## 2022-02-10 MED ORDER — SODIUM CHLORIDE 0.9 % IV BOLUS
1000.0000 mL | Freq: Once | INTRAVENOUS | Status: AC
  Administered 2022-02-10: 1000 mL via INTRAVENOUS

## 2022-02-10 MED ORDER — ONDANSETRON 4 MG PO TBDP: 4.0000 mg | ORAL_TABLET | Freq: Three times a day (TID) | ORAL | 0 refills | Status: DC | PRN

## 2022-02-10 NOTE — Addendum Note (Signed)
Addended by: Ashley Royalty E on: 02/10/2022 01:34 PM   Modules accepted: Orders

## 2022-02-10 NOTE — Progress Notes (Signed)
Subjective:    Patient ID: Tamara Mcclain, female    DOB: [birth year], 21 y.o.   MRN: [id number]  HPI  Patient presents to clinic today for follow-up of chronic conditions.  Epiploic Appendagitis: She is not currently taking any medications for this.  She is not currently following with GI.  Panic Attacks: These occur rarely.  She is able to calm herself down without medications.  She is not currently seeing a therapist.  She denies depression, SI/HI.  Asthma: She denies chronic cough. She is not taking Symbicort but she does use Albuterol as needed. PFT's from 07/2021 reviewed.  Review of Systems  Past Medical History:  Diagnosis Date   Anxiety    Epiploic appendagitis    MTHFR mutation    Von Willebrand disease ([place]) Dr. Grandville Silos at [facility]    Current Outpatient Medications  Medication Sig Dispense Refill   albuterol (VENTOLIN HFA) 108 (90 Base) MCG/ACT inhaler Inhale 1-2 puffs into the lungs every 6 (six) hours as needed for wheezing or shortness of breath. 8 g 0   budesonide-formoterol (SYMBICORT) 160-4.5 MCG/ACT inhaler Inhale 2 puffs into the lungs in the morning and at bedtime. 10.2 g 5   lisinopril (ZESTRIL) 20 MG tablet Take 1 tablet (20 mg total) by mouth daily. (Patient not taking: Reported on 01/28/2022) 90 tablet 1   Prenatal Vit-Fe Fumarate-FA (PRENATAL VITAMINS) 28-0.8 MG TABS Take 1 tablet by mouth daily.     No current facility-administered medications for this visit.    Allergies  Allergen Reactions   Tylenol [Acetaminophen] Other (See Comments)    fever   Depo-Medrol [Methylprednisolone] Nausea Only    Feeling flushed    Family History  Problem Relation Age of Onset   Hypertension Father    Pulmonary embolism Father    Cancer Maternal Grandmother 47       breast   Arthritis Maternal Grandmother    Hyperlipidemia Maternal Grandmother    CAD Maternal Grandfather    Cancer Paternal Grandmother        melanoma, sinus cancer   Colon polyps Paternal  Grandmother    Heart disease Paternal Grandfather    Gaucher's disease Half-Brother    Esophageal cancer Neg Hx    Stomach cancer Neg Hx    Liver disease Neg Hx     Social History   Socioeconomic History   Marital status: Single    Spouse name: Not on file   Number of children: 0   Years of education: 12   Highest education level: Not on file  Occupational History   Occupation: unemployed; Dealer  Tobacco Use   Smoking status: Never    Passive exposure: Past   Smokeless tobacco: Never  Vaping Use   Vaping Use: Never used  Substance and Sexual Activity   Alcohol use: No    Alcohol/week: 0.0 standard drinks of alcohol   Drug use: No   Sexual activity: Yes    Partners: Male    Birth control/protection: None  Other Topics Concern   Not on file  Social History Narrative   Not on file   Social Determinants of Health   Financial Resource Strain: Low Risk  (12/31/2021)   Overall Financial Resource Strain (CARDIA)    Difficulty of Paying Living Expenses: Not hard at all  Food Insecurity: Not on file  Transportation Needs: No Transportation Needs (12/31/2021)   PRAPARE - Transportation    Lack of Transportation (Medical): No    Lack of Transportation (Non-Medical): No  Physical Activity: Sufficiently Active (12/31/2021)   Exercise Vital Sign    Days of Exercise per Week: 4 days    Minutes of Exercise per Session: 150+ min  Stress: No Stress Concern Present (12/31/2021)   [employer] of Occupational Health - Occupational Stress Questionnaire    Feeling of Stress : Not at all  Social Connections: Moderately Integrated (12/31/2021)   Social Connection and Isolation Panel [NHANES]    Frequency of Communication with Friends and Family: More than three times a week    Frequency of Social Gatherings with Friends and Family: Three times a week    Attends Religious Services: More than 4 times per year    Active Member of Clubs or Organizations: No    Attends Tax inspector Meetings: Never    Marital Status: Living with partner  Intimate Partner Violence: Not At Risk (12/31/2021)   Humiliation, Afraid, Rape, and Kick questionnaire    Fear of Current or Ex-Partner: No    Emotionally Abused: No    Physically Abused: No    Sexually Abused: No     Constitutional: Denies fever, malaise, fatigue, headache or abrupt weight changes.  HEENT: Pt reports nasal congestion and sore throat. Denies eye pain, eye redness, ear pain, ringing in the ears, wax buildup, runny nose, bloody nose. Respiratory: Pt reports shortness of breath. Denies difficulty breathing, cough or sputum production.   Cardiovascular: Denies chest pain, chest tightness, palpitations or swelling in the hands or feet.  Gastrointestinal: Pt reports vomiting. Denies abdominal pain, bloating, constipation, diarrhea or blood in the stool.  GU: Denies urgency, frequency, pain with urination, burning sensation, blood in urine, odor or discharge. Musculoskeletal: Denies decrease in range of motion, difficulty with gait, muscle pain or joint pain and swelling.  Skin: Denies redness, rashes, lesions or ulcercations.  Neurological: Denies dizziness, difficulty with memory, difficulty with speech or problems with balance and coordination.  Psych: Patient has a history of panic attacks.  Denies depression, SI/HI.  No other specific complaints in a complete review of systems (except as listed in HPI above).     Objective:   Physical Exam   BP 120/68   Pulse (!) 115   Temp 97.9 F (36.6 C)   Resp 18   Wt 190 lb (86.2 kg)   LMP 11/13/2021 (Approximate)   SpO2 98%   BMI 37.11 kg/m   Wt Readings from Last 3 Encounters:  02/06/22 190 lb 12.8 oz (86.5 kg)  02/02/22 192 lb (87.1 kg)  01/28/22 192 lb (87.1 kg)    General: Appears her stated age, obese, in NAD. Skin: Warm, dry and intact. No rashes noted. HEENT: Head: normal shape and size, no sinus tenderness noted; Eyes: sclera white, no  icterus, conjunctiva pink, PERRLA and EOMs intact; Ears: Tm's gray and intact, normal light reflex; Throat/Mouth: Teeth present, mucosa pink and moist, no exudate, lesions or ulcerations noted.  Neck:  No adenopathy noted. Cardiovascular: Tachycardic with normal rhythm. S1,S2 noted.  No murmur, rubs or gallops noted. No JVD or BLE edema.  Pulmonary/Chest: Normal effort and positive vesicular breath sounds. No respiratory distress. No wheezes, rales or ronchi noted.  Musculoskeletal: No difficulty with gait.  Neurological: Alert and oriented.  Psychiatric: Mood and affect normal. Behavior is normal. Judgment and thought content normal.    BMET    Component Value Date/Time   NA 137 02/02/2022 1002   NA 136 01/28/2022 1047   K 3.9 02/02/2022 1002   CL 107 02/02/2022 1002  CO2 23 02/02/2022 1002   GLUCOSE 95 02/02/2022 1002   BUN 6 02/02/2022 1002   BUN 4 (L) 01/28/2022 1047   CREATININE 0.67 02/02/2022 1002   CALCIUM 9.4 02/02/2022 1002   GFRNONAA >60 02/02/2022 1002   GFRAA >60 04/09/2018 2151    Lipid Panel     Component Value Date/Time   CHOL 168 11/27/2021 1428   TRIG 145 11/27/2021 1428   HDL 32 (L) 11/27/2021 1428   CHOLHDL 5.3 (H) 11/27/2021 1428   LDLCALC 110 (H) 11/27/2021 1428    CBC    Component Value Date/Time   WBC 8.5 02/02/2022 1002   RBC 5.04 02/02/2022 1002   HGB 14.0 02/02/2022 1002   HGB 14.0 01/14/2022 1413   HCT 41.0 02/02/2022 1002   HCT 41.8 01/14/2022 1413   PLT 308 02/02/2022 1002   PLT 328 01/14/2022 1413   MCV 81.3 02/02/2022 1002   MCV 84 01/14/2022 1413   MCH 27.8 02/02/2022 1002   MCHC 34.1 02/02/2022 1002   RDW 12.9 02/02/2022 1002   RDW 13.2 01/14/2022 1413   LYMPHSABS 1.9 01/14/2022 1413   MONOABS 0.6 07/24/2021 1612   EOSABS 0.0 01/14/2022 1413   BASOSABS 0.0 01/14/2022 1413    Hgb A1C Lab Results  Component Value Date   HGBA1C 5.3 01/28/2022           Assessment & Plan:   Nasal Congestion, Sore Throat, Shortness  of Breath, Vomiting:  Rapid strep negative Rapid flu negative Will send off covid test Start Flonase and Zyrtec OTC RX for Zofran 4 mg Q8H prn Continue to use your inhaler as needed for shortness of breath Encouraged rest and fluids   RTC in 9 months for annual exam  Nicki Reaper, NP

## 2022-02-10 NOTE — Discharge Instructions (Addendum)
You have been diagnosed with having morning sickness.  You may take Diclegis medication to help with your nausea.  Stay hydrated by drinking small amount of fluid throughout the day.  Follow-up closely with your OB/GYN for further care.  Return if you have any concern.

## 2022-02-10 NOTE — ED Notes (Signed)
Pt resting, fluids running, no complaints at this time.

## 2022-02-10 NOTE — Assessment & Plan Note (Signed)
Continue Albuterol as needed 

## 2022-02-10 NOTE — Telephone Encounter (Signed)
Pt calling triage reporting she can not keep anything down pt has zofran reports its not helping. Diclegis sent in to see if that will help. She is worried The needs to  go to the ER. I advised her to try small sips of water and something lite to eat.

## 2022-02-10 NOTE — ED Triage Notes (Signed)
Pt states she has had nausea and vomiting. Pt is [redacted] weeks pregnant. Pt told to come for dehydration by OBGYN.  Pt had congestion and sore throat since 0100- was tested for COVID, flu, strep at PCP, was negative.

## 2022-02-10 NOTE — Assessment & Plan Note (Signed)
Support offered  

## 2022-02-10 NOTE — ED Provider Notes (Signed)
[facility] EMERGENCY DEPT Provider Note   CSN: [id number] Arrival date & time: 02/10/22  1624     History  Chief Complaint  Patient presents with   Emesis During Pregnancy    Tamara Mcclain is a 21 y.o. female.  The history is provided by the patient and medical records. No language interpreter was used.     21 year old female significant history of von Willebrand disease, anxiety, asthma, who is approximately [redacted] weeks pregnant presenting complaining of nausea and vomiting.  Patient reports she woke up this morning with some cold symptoms including sinus congestion and throat irritation.  She went to see her PCP and had COVID flu and strep test checked and states that it was normal.  However she reported having increased nausea and has vomited multiple times throughout the day.  Vomitus is nonbloody nonbilious.  She reached out to her OB/GYN and spoke with the nurse who recommend patient to come to the ER for further evaluation.  She felt she is a bit dehydrated.  While in the waiting room she received antinausea medication at this moment she states she is feeling a bit better.  She does not endorse any fever chest pain shortness of breath abdominal pain or dysuria.  No vaginal bleeding or vaginal discharge.  Home Medications Prior to Admission medications   Medication Sig Start Date End Date Taking? Authorizing Provider  albuterol (VENTOLIN HFA) 108 (90 Base) MCG/ACT inhaler Inhale 1-2 puffs into the lungs every 6 (six) hours as needed for wheezing or shortness of breath. 10/06/21   Merwyn Katos, MD  budesonide-formoterol (SYMBICORT) 160-4.5 MCG/ACT inhaler Inhale 2 puffs into the lungs in the morning and at bedtime. 07/28/21   Mannam, Colbert Coyer, MD  Doxylamine-Pyridoxine (DICLEGIS) 10-10 MG TBEC Take 2 tablets by mouth at bedtime. If symptoms persist, add one tablet in the morning and one in the afternoon 02/10/22   Dominic, Courtney Heys, CNM  ondansetron (ZOFRAN-ODT) 4 MG  disintegrating tablet Take 1 tablet (4 mg total) by mouth every 8 (eight) hours as needed for nausea or vomiting. 02/10/22   Lorre Munroe, NP  Prenatal Vit-Fe Fumarate-FA (PRENATAL VITAMINS) 28-0.8 MG TABS Take 1 tablet by mouth daily.    [provider]      Allergies    Tylenol [acetaminophen] and Depo-medrol [methylprednisolone]    Review of Systems   Review of Systems  All other systems reviewed and are negative.   Physical Exam Updated Vital Signs BP 127/83   Pulse (!) 101   Temp 98.5 F (36.9 C) (Oral)   Resp (!) 22   Ht 5' (1.524 m)   Wt 86.2 kg   LMP 11/13/2021 (Approximate)   SpO2 100%   BMI 37.11 kg/m  Physical Exam Vitals and nursing note reviewed.  Constitutional:      General: She is not in acute distress.    Appearance: She is well-developed. She is obese.  HENT:     Head: Atraumatic.     Mouth/Throat:     Mouth: Mucous membranes are moist.  Eyes:     Conjunctiva/sclera: Conjunctivae normal.  Cardiovascular:     Rate and Rhythm: Normal rate and regular rhythm.     Pulses: Normal pulses.     Heart sounds: Normal heart sounds.  Pulmonary:     Effort: Pulmonary effort is normal.  Abdominal:     Palpations: Abdomen is soft.     Tenderness: There is no abdominal tenderness.  Musculoskeletal:  Cervical back: Neck supple.  Skin:    Findings: No rash.  Neurological:     Mental Status: She is alert.  Psychiatric:        Mood and Affect: Mood normal.     ED Results / Procedures / Treatments   Labs (all labs ordered are listed, but only abnormal results are displayed) Labs Reviewed  LIPASE, BLOOD - Abnormal; Notable for the following components:      Result Value   Lipase <10 (*)    All other components within normal limits  COMPREHENSIVE METABOLIC PANEL - Abnormal; Notable for the following components:   CO2 21 (*)    BUN <5 (*)    AST 14 (*)    Total Bilirubin 1.4 (*)    All other components within normal limits  CBC - Abnormal;  Notable for the following components:   WBC 13.5 (*)    All other components within normal limits  URINALYSIS, ROUTINE W REFLEX MICROSCOPIC    EKG None  Radiology No results found.  Procedures Procedures    Medications Ordered in ED Medications  ondansetron (ZOFRAN) 4 MG/2ML injection (has no administration in time range)  ondansetron (ZOFRAN) injection 4 mg (4 mg Intravenous Given 02/10/22 1811)    ED Course/ Medical Decision Making/ A&P                           Medical Decision Making Amount and/or Complexity of Data Reviewed Labs: ordered.  Risk Prescription drug management.   BP 127/83   Pulse (!) 101   Temp 98.5 F (36.9 C) (Oral)   Resp (!) 22   Ht 5' (1.524 m)   Wt 86.2 kg   LMP 11/13/2021 (Approximate)   SpO2 100%   BMI 37.11 kg/m   6:57 PM This is a 21 year old female who is [redacted] weeks pregnant presenting with complaints of nausea and vomiting.  She initially noted some mild cold symptoms this morning and was seen by her PCP.  She was tested for COVID flu and strep which came back negative.  She was discharged home.  She then endorsed having worsening nausea and has vomited multiple times since.  Vomitus is nonbloody nonbilious.  No abdominal pain no vaginal bleeding no vaginal discharge no fever chills or body aches.  No other changes.  On exam, patient is resting comfortably appears to be in no acute discomfort.  She has a soft nontender abdomen.  She does not appears to be significantly dehydrated.  Heart and lung sounds normal.  She is mildly tachycardic and mildly tachypneic but no hypoxia.  She did receive Zofran in the waiting room and endorsed improvement of her nausea.  We will give IV fluid here.  Labs obtained independently reviewed and by me.  White count of 13.5 but other wise electrolyte panels are reassuring.  I also reviewed EMR and considered my plan of care.  Recent COVID and flu test obtained and are negative.   8:40 PM After receiving IV  fluid patient report feeling much better.  She tolerates p.o.  At this time she is stable to be discharged home with diclegis medication to help with her nausea.  Recommend outpatient follow-up with OB/GYN for further care in regards to her pregnancy.  Return precaution given.  This patient presents to the ED for concern of nausea, this involves an extensive number of treatment options, and is a complaint that carries with it a high risk of  complications and morbidity.  The differential diagnosis includes hyperemesis gravidarum, food poisoning, uti, pid, dehydration  Co morbidities that complicate the patient evaluation pregnancy Additional history obtained:  Additional history obtained from husband External records from outside source obtained and reviewed including EMR  Lab Tests:  I Ordered, and personally interpreted labs.  The pertinent results include:  as above   Cardiac Monitoring:  The patient was maintained on a cardiac monitor.  I personally viewed and interpreted the cardiac monitored which showed an underlying rhythm of: NSR  Medicines ordered and prescription drug management:  I ordered medication including IVF  for dehydration Reevaluation of the patient after these medicines showed that the patient improved I have reviewed the patients home medicines and have made adjustments as needed  Test Considered: as above  Critical Interventions: IVF  antiemetic   Problem List / ED Course: dehydration  nausea  Reevaluation:  After the interventions noted above, I reevaluated the patient and found that they have :improved  Social Determinants of Health: depression  Dispostion:  After consideration of the diagnostic results and the patients response to treatment, I feel that the patent would benefit from outpt f/u.         Final Clinical Impression(s) / ED Diagnoses Final diagnoses:  Hyperemesis gravidarum  Dehydration    Rx / DC Orders ED Discharge  Orders          Ordered    Doxylamine-Pyridoxine (DICLEGIS) 10-10 MG TBEC  At bedtime PRN        02/10/22 2039              Domenic Moras, PA-C 02/10/22 2043    Malvin Johns, MD 02/10/22 2241

## 2022-02-10 NOTE — Assessment & Plan Note (Signed)
Currently pregnant Encouraged healthy diet and exercise regimen

## 2022-02-10 NOTE — Assessment & Plan Note (Signed)
Currently not having any issues, not medicated

## 2022-02-10 NOTE — Patient Instructions (Signed)

## 2022-02-10 NOTE — ED Notes (Signed)
RT asked to assess patient. Patient complains of shortness of breath; BBS clear and no other adventitious breath sounds noted. O2 sats 99-100% on RA. Patient appears anxious and restless in triage on assessment. Patient endorses nausea, unable to drink/eat (asked for water from visitor during assessment), and requesting to know the wait time. This RT offered to let triage RN know of patient's nausea. No concerns with patient's airway at the time of assessment.

## 2022-02-12 LAB — SARS-COV-2 RNA,(COVID-19) QUALITATIVE NAAT: SARS CoV2 RNA: NOT DETECTED

## 2022-02-12 NOTE — Telephone Encounter (Signed)
Patient is scheduled for 10/17 withAT

## 2022-02-14 LAB — DRUG SCREEN, URINE
Amphetamines, Urine: NEGATIVE ng/mL
Barbiturate screen, urine: NEGATIVE ng/mL
Benzodiazepine Quant, Ur: NEGATIVE ng/mL
Cannabinoid Quant, Ur: NEGATIVE ng/mL
Cocaine (Metab.): NEGATIVE ng/mL
Opiate Quant, Ur: NEGATIVE ng/mL
PCP Quant, Ur: NEGATIVE ng/mL

## 2022-02-17 ENCOUNTER — Ambulatory Visit: Payer: BC Managed Care – PPO | Admitting: Gastroenterology

## 2022-02-19 ENCOUNTER — Telehealth: Payer: Self-pay

## 2022-02-19 NOTE — Telephone Encounter (Signed)
Pt calling triage saying she just finished vomiting and towards the end and after vomiting she had some blood come out of nose. The total amount of time it lasted was about 10 minutes. I advised (confirmed with MMF also) that it's normal to experience when vomiting, to keep an eye on it. She also mentioned she noticed a slight headache and went to lay down for about 20 minutes and checked her BP at home and it was 123/99. She has not checked it again since that time. I suggested she sits down for a couple of minutes then check her BP again and give [place] a call if its high again.

## 2022-02-24 ENCOUNTER — Ambulatory Visit (INDEPENDENT_AMBULATORY_CARE_PROVIDER_SITE_OTHER): Payer: BC Managed Care – PPO | Admitting: Certified Nurse Midwife

## 2022-02-24 ENCOUNTER — Encounter: Payer: BC Managed Care – PPO | Admitting: Obstetrics & Gynecology

## 2022-02-24 VITALS — BP 128/88 | HR 93 | Wt 186.3 lb

## 2022-02-24 DIAGNOSIS — Z3A13 13 weeks gestation of pregnancy: Secondary | ICD-10-CM

## 2022-02-24 DIAGNOSIS — Z3481 Encounter for supervision of other normal pregnancy, first trimester: Secondary | ICD-10-CM

## 2022-02-24 LAB — POCT URINALYSIS DIPSTICK OB
Bilirubin, UA: NEGATIVE
Blood, UA: NEGATIVE
Glucose, UA: NEGATIVE
Ketones, UA: NEGATIVE
Leukocytes, UA: NEGATIVE
Nitrite, UA: NEGATIVE
POC,PROTEIN,UA: NEGATIVE
Spec Grav, UA: 1.025 (ref 1.010–1.025)
Urobilinogen, UA: 0.2 E.U./dL
pH, UA: 5 (ref 5.0–8.0)

## 2022-02-24 NOTE — Progress Notes (Signed)
ROB doing well. Not feeling movement yet. She is interested in genetic testing . She wants to do the Maternit [facility] and specifically wants to test for Gaucher disease her brother has this and her father is a carrier. Orders placed. She has u/s for anatomy scheduled with MFM due to hx CHTN. Follow up 4 wks for ROB.   Philip Aspen, CNM

## 2022-02-24 NOTE — Patient Instructions (Signed)
Round Ligament Pain  The round ligaments are a pair of cord-like tissues that help support the uterus. They can become a source of pain during pregnancy as the ligaments soften and stretch as the baby grows. The pain usually begins in the second trimester (13-28 weeks) of pregnancy, and should only last for a few seconds when it occurs. However, the pain can come and go until the baby is delivered. The pain does not cause harm to the baby. Round ligament pain is usually a short, sharp, and pinching pain, but it can also be a dull, lingering, and aching pain. The pain is felt in the lower side of the abdomen or in the groin. It usually starts deep in the groin and moves up to the outside of the hip area. The pain may happen when you: Suddenly change position, such as quickly going from a sitting to standing position. Do physical activity. Cough or sneeze. Follow these instructions at home: Managing pain  When the pain starts, relax. Then, try any of these methods to help with the pain: Sit down. Flex your knees up to your abdomen. Lie on your side with one pillow under your abdomen and another pillow between your legs. Sit in a warm bath for 15-20 minutes or until the pain goes away. General instructions Watch your condition for any changes. Move slowly when you sit down or stand up. Stop or reduce your physical activities if they cause pain. Avoid long walks if they cause pain. Take over-the-counter and prescription medicines only as told by your health care provider. Keep all follow-up visits. This is important. Contact a health care provider if: Your pain does not go away with treatment. You feel pain in your back that you did not have before. Your medicine is not helping. You have a fever or chills. You have nausea or vomiting. You have diarrhea. You have pain when you urinate. Get help right away if: You have pain that is a rhythmic, cramping pain similar to labor pains. Labor  pains are usually 2 minutes apart, last for about 1 minute, and involve a bearing down feeling or pressure in your pelvis. You have vaginal bleeding. These symptoms may represent a serious problem that is an emergency. Do not wait to see if the symptoms will go away. Get medical help right away. Call your local emergency services (911 in the [place] Do not drive yourself to the hospital. Summary Round ligament pain is felt in the lower abdomen or groin. This pain usually begins in the second trimester (13-28 weeks) and should only last for a few seconds when it occurs. You may notice the pain when you suddenly change position, when you cough or sneeze, or during physical activity. Relaxing, flexing your knees to your abdomen, lying on one side, or taking a warm bath may help to get rid of the pain. Contact your health care provider if the pain does not go away. This information is not intended to replace advice given to you by your health care provider. Make sure you discuss any questions you have with your health care provider. Document Revised: 07/10/2020 Document Reviewed: 07/10/2020 Elsevier Patient Education  2023 [employer]vier Inc.  

## 2022-02-27 ENCOUNTER — Telehealth: Payer: Self-pay

## 2022-02-27 NOTE — Telephone Encounter (Signed)
Pt called triage requesting advise on nausea/vomiting. Advised all of this is normal at beginning of pregnancy. She was prescribed zofran and it is not working. I gave her all advise on our protocol book and she will try it. Was also advised if she cannot keep fluids down in a 24 hr period to go to ER.

## 2022-02-28 LAB — MATERNIT 21 PLUS CORE, BLOOD
Fetal Fraction: 5
Result (T21): NEGATIVE
Trisomy 13 (Patau syndrome): NEGATIVE
Trisomy 18 (Edwards syndrome): NEGATIVE
Trisomy 21 (Down syndrome): NEGATIVE

## 2022-03-04 ENCOUNTER — Other Ambulatory Visit: Payer: Self-pay | Admitting: Certified Nurse Midwife

## 2022-03-04 ENCOUNTER — Encounter: Payer: Self-pay | Admitting: Certified Nurse Midwife

## 2022-03-04 LAB — GAUCHER ENZYME ANALYSIS: Glucocerebrosidase/WBC: 6.914 (ref 7.5–14.5)

## 2022-03-07 ENCOUNTER — Other Ambulatory Visit: Payer: Self-pay

## 2022-03-07 ENCOUNTER — Encounter: Payer: Self-pay | Admitting: Emergency Medicine

## 2022-03-07 ENCOUNTER — Emergency Department
Admission: EM | Admit: 2022-03-07 | Discharge: 2022-03-07 | Disposition: A | Discharge status: Home / Self Care | Payer: BC Managed Care – PPO | Attending: Emergency Medicine | Admitting: Emergency Medicine

## 2022-03-07 DIAGNOSIS — O99511 Diseases of the respiratory system complicating pregnancy, first trimester: Secondary | ICD-10-CM | POA: Insufficient documentation

## 2022-03-07 DIAGNOSIS — O99281 Endocrine, nutritional and metabolic diseases complicating pregnancy, first trimester: Secondary | ICD-10-CM | POA: Diagnosis not present

## 2022-03-07 DIAGNOSIS — O219 Vomiting of pregnancy, unspecified: Secondary | ICD-10-CM | POA: Insufficient documentation

## 2022-03-07 DIAGNOSIS — Z3A15 15 weeks gestation of pregnancy: Secondary | ICD-10-CM | POA: Insufficient documentation

## 2022-03-07 DIAGNOSIS — J45909 Unspecified asthma, uncomplicated: Secondary | ICD-10-CM | POA: Diagnosis not present

## 2022-03-07 LAB — COMPREHENSIVE METABOLIC PANEL
ALT: 8 U/L (ref 0–44)
AST: 13 U/L — ABNORMAL LOW (ref 15–41)
Albumin: 3.4 g/dL — ABNORMAL LOW (ref 3.5–5.0)
Alkaline Phosphatase: 65 U/L (ref 38–126)
Anion gap: 8 (ref 5–15)
BUN: <5 mg/dL — ABNORMAL LOW (ref 6–20)
CO2: 22 mmol/L (ref 22–32)
Calcium: 9.1 mg/dL (ref 8.9–10.3)
Chloride: 107 mmol/L (ref 98–111)
Creatinine, Ser: 0.64 mg/dL (ref 0.44–1.00)
GFR, Estimated: >60 mL/min (ref >60)
Glucose, Bld: 89 mg/dL (ref 70–99)
Potassium: 3.8 mmol/L (ref 3.5–5.1)
Sodium: 137 mmol/L (ref 135–145)
Total Bilirubin: 1.4 mg/dL — ABNORMAL HIGH (ref 0.3–1.2)
Total Protein: 7.3 g/dL (ref 6.5–8.1)

## 2022-03-07 LAB — LIPASE, BLOOD: Lipase: 23 U/L (ref 11–51)

## 2022-03-07 LAB — CBC
HCT: 40 % (ref 36.0–46.0)
Hemoglobin: 13.5 g/dL (ref 12.0–15.0)
MCH: 27.6 pg (ref 26.0–34.0)
MCHC: 33.8 g/dL (ref 30.0–36.0)
MCV: 81.8 fL (ref 80.0–100.0)
Platelets: 283 10*3/uL (ref 150–400)
RBC: 4.89 MIL/uL (ref 3.87–5.11)
RDW: 13.3 % (ref 11.5–15.5)
WBC: 9.6 10*3/uL (ref 4.0–10.5)
nRBC: 0 % (ref 0.0–0.2)

## 2022-03-07 MED ORDER — LACTATED RINGERS IV BOLUS
1000.0000 mL | Freq: Once | INTRAVENOUS | Status: AC
  Administered 2022-03-07: 1000 mL via INTRAVENOUS

## 2022-03-07 MED ORDER — PANTOPRAZOLE 80MG IVPB - SIMPLE MED
80.0000 mg | Freq: Once | INTRAVENOUS | Status: AC
  Administered 2022-03-07: 80 mg via INTRAVENOUS
  Filled 2022-03-07: qty 100

## 2022-03-07 MED ORDER — METOCLOPRAMIDE HCL 5 MG/ML IJ SOLN
10.0000 mg | Freq: Once | INTRAMUSCULAR | Status: AC
  Administered 2022-03-07: 10 mg via INTRAVENOUS
  Filled 2022-03-07: qty 2

## 2022-03-07 MED ORDER — PANTOPRAZOLE SODIUM 40 MG PO TBEC: 40.0000 mg | DELAYED_RELEASE_TABLET | Freq: Every day | ORAL | 0 refills | Status: DC

## 2022-03-07 NOTE — Discharge Instructions (Addendum)
Your blood work is reassuring.  Please take the antinausea medicines that you are prescribed.  You can start taking the Protonix to help with acid suppression.  If you have additional episodes of vomiting blood or you have any black stool or blood in your stool please return to the emergency department.  Otherwise please follow-up with your OB/GYN.

## 2022-03-07 NOTE — ED Provider Notes (Signed)
[facility] Provider Note    Event Date/Time   First MD Initiated Contact with Patient 03/07/22 1132     (approximate)   History   Emesis   HPI  Tamara Mcclain is a 21 y.o. female G1, P0 approximately [redacted] weeks pregnant who presents with concern for hematemesis.  Patient tells me she has about several episodes of emesis every morning.  Today she had first emesis that was clear and subsequently had 2 episodes that looked bloody, dark brown to her.  She has not had any additional episodes since that time.  Has not had a BM today.  Denies any black stool or blood in her stool.  She denies any significant abdominal pain.  Has had history of hyperemesis during this pregnancy.  Was prescribed Zofran but has not picked it up.  She has been eating poorly and snacking mostly throughout the day but does not frequently vomit throughout the day its really just in the morning.  Denies fevers or chills.  No urinary symptoms.     Past Medical History:  Diagnosis Date   Anxiety    Epiploic appendagitis    MTHFR mutation    Von Willebrand disease (HCC) Dr. 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      Excl. in GC?     Most recent vital signs: Vitals:   03/07/22 1104  BP: (!) 140/95  Pulse: 82  Resp: 16  Temp: 99 F (37.2 C)  SpO2: 100%     General: Awake, no distress.  CV:  Good peripheral  perfusion.  Resp:  Normal effort.  Abd:  No distention.  Abdomen is soft and nontender throughout Neuro:             Awake, Alert, Oriented x 3  Other:     ED Results / Procedures / Treatments  Labs (all labs ordered are listed, but only abnormal results are displayed) Labs Reviewed  COMPREHENSIVE METABOLIC PANEL - Abnormal; Notable for the following components:      Result Value   BUN <5 (*)    Albumin 3.4 (*)    AST 13 (*)    Total Bilirubin 1.4 (*)    All other components within normal limits  LIPASE, BLOOD  CBC  URINALYSIS, ROUTINE W REFLEX MICROSCOPIC     EKG     RADIOLOGY I performed and interpreted a bedside ultrasound of the pelvic organs, there is an intrauterine pregnancy, fetal heart rate 140, positive fetal movement   PROCEDURES:  Critical Care performed: No  Procedures   MEDICATIONS ORDERED  IN ED: Medications  lactated ringers bolus 1,000 mL (1,000 mLs Intravenous New Bag/Given 03/07/22 1229)  pantoprazole (PROTONIX) 80 mg /NS 100 mL IVPB (80 mg Intravenous New Bag/Given 03/07/22 1231)  metoCLOPramide (REGLAN) injection 10 mg (10 mg Intravenous Given 03/07/22 1229)     IMPRESSION / MDM / ASSESSMENT AND PLAN / ED COURSE  I reviewed the triage vital signs and the nursing notes.                              Patient's presentation is most consistent with acute presentation with potential threat to life or bodily function.  Differential diagnosis includes, but is not limited to, gastritis, reflux, esophagitis, Mallory-Weiss tear, hyperemesis gravidarum  Patient is a 21 year old female in the first trimester pregnancy about [redacted] weeks pregnant who presents with vomiting.  Patient has emesis every a.m. this morning had an episode that was clear and 2 subsequent episodes that looked bloody to her.  Describes dark brown emesis.  Has no abdominal pain but has not really eaten or drank since this episode.  She does follow with OB regularly was admitted  recently for hyperemesis prescribe Zofran but has not picked it up.  She is not vomiting throughout the day just in the morning.  Patient's vitals are reassuring she overall looks well clinically on exam.  Does not appear to be dehydrated with moist mucous membranes and her abdomen is benign.  His labs are also reassuring she has a normal hemoglobin actually decreased BUN which is reassuring in the setting of possible upper GI bleed.  T. bili just mildly elevated likely in the setting of emesis.  Lipase is normal.  Patient was given a liter of fluid IV Protonix and Reglan.  After which she was able to tolerate p.o.  She did have 1 additional episode of emesis in the ER but it was clear which is reassuring to me that there is no ongoing GI bleeding.  My suspicion for significant upper GI bleed is low she could have had some gastritis or esophagitis potentially even a Mallory-Weiss tear but given there is no ongoing bleeding I think this is less likely.  I performed a bedside ultrasound of the pelvic organs and there is good fetal movement and normal fetal heart rate.  We will start patient on Protonix.  Encouraged the use of Zofran that she was prescribed.  Discussed return to ED for any recurrent episodes of hematemesis or blood or black stool.  She is appropriate for discharge.       FINAL CLINICAL IMPRESSION(S) / ED DIAGNOSES   Final diagnoses:  Nausea and vomiting in pregnancy     Rx / DC Orders   ED Discharge Orders          Ordered    pantoprazole (PROTONIX) 40 MG tablet  Daily        03/07/22 1432             Note:  This document was prepared using Dragon voice recognition software and may include unintentional dictation errors.   Rada Hay, MD 03/07/22 1435

## 2022-03-07 NOTE — ED Triage Notes (Signed)
Pt to ED via POV stating that her OBGYN told her to come to the ED due to vomiting blood this morning. Pt states that she vomits every morning but this morning there was blood in her vomiting. Pt denies pain. Pt states that this is her first pregnancy.

## 2022-03-10 ENCOUNTER — Ambulatory Visit (INDEPENDENT_AMBULATORY_CARE_PROVIDER_SITE_OTHER): Payer: BC Managed Care – PPO | Admitting: Obstetrics

## 2022-03-10 ENCOUNTER — Encounter: Payer: Self-pay | Admitting: Obstetrics

## 2022-03-10 VITALS — BP 126/86 | HR 94 | Wt 186.0 lb

## 2022-03-10 DIAGNOSIS — Z3A15 15 weeks gestation of pregnancy: Secondary | ICD-10-CM

## 2022-03-10 DIAGNOSIS — O219 Vomiting of pregnancy, unspecified: Secondary | ICD-10-CM

## 2022-03-10 DIAGNOSIS — Z3402 Encounter for supervision of normal first pregnancy, second trimester: Secondary | ICD-10-CM

## 2022-03-10 LAB — POCT URINALYSIS DIPSTICK OB
Appearance: NEGATIVE
Bilirubin, UA: NEGATIVE
Blood, UA: NEGATIVE
Glucose, UA: NEGATIVE
Ketones, UA: NEGATIVE
Nitrite, UA: NEGATIVE
POC,PROTEIN,UA: NEGATIVE
Spec Grav, UA: 1.015 (ref 1.010–1.025)
Urobilinogen, UA: 0.2 E.U./dL
pH, UA: 7.5 (ref 5.0–8.0)

## 2022-03-10 MED ORDER — ONDANSETRON HCL 4 MG PO TABS: 4.0000 mg | ORAL_TABLET | Freq: Three times a day (TID) | ORAL | 1 refills | Status: DC | PRN

## 2022-03-10 MED ORDER — DOXYLAMINE-PYRIDOXINE 10-10 MG PO TBEC: 4.0000 | DELAYED_RELEASE_TABLET | Freq: Every day | ORAL | 2 refills | Status: DC

## 2022-03-10 NOTE — Progress Notes (Signed)
Tamara Mcclain is here for ED follow up today at [redacted]w[redacted]d. She was in the ED over the weekend because she had been vomiting bright red blood at home. She typically vomits daily, sometimes 2-3 times throughout the course of the day. On Saturday, she began vomiting bile and then bright red blood. She called the triage line and was directed to the ED. When she arrived, she was no longer vomiting blood but still did have several episodes of emesis. She was given Protonix, Reglan, and IV fluids. She was sent home with a prescription for Protonix. She continues to have daily emesis. She has not been taking the Protonix. Encouraged to take it daily along with Zofran PRN. She is able to tolerate her PNV if she takes it at bedtime. Discussed adding protein shakes to her diet, which she feels she can tolerate. Rx for Zofran and Diclegis sent to pharmacy. ROB visit and anatomy scan already scheduled.   Lloyd Huger, CNM

## 2022-03-22 ENCOUNTER — Encounter: Payer: Self-pay | Admitting: Obstetrics and Gynecology

## 2022-03-22 DIAGNOSIS — Z8481 Family history of carrier of genetic disease: Secondary | ICD-10-CM | POA: Insufficient documentation

## 2022-03-22 DIAGNOSIS — O10919 Unspecified pre-existing hypertension complicating pregnancy, unspecified trimester: Secondary | ICD-10-CM | POA: Insufficient documentation

## 2022-03-24 ENCOUNTER — Ambulatory Visit (INDEPENDENT_AMBULATORY_CARE_PROVIDER_SITE_OTHER): Payer: BC Managed Care – PPO | Admitting: Obstetrics and Gynecology

## 2022-03-24 ENCOUNTER — Encounter: Payer: BC Managed Care – PPO | Admitting: Obstetrics and Gynecology

## 2022-03-24 ENCOUNTER — Encounter: Payer: Self-pay | Admitting: Obstetrics and Gynecology

## 2022-03-24 VITALS — BP 118/84 | HR 89 | Wt 182.8 lb

## 2022-03-24 DIAGNOSIS — Z3A17 17 weeks gestation of pregnancy: Secondary | ICD-10-CM

## 2022-03-24 DIAGNOSIS — Z3402 Encounter for supervision of normal first pregnancy, second trimester: Secondary | ICD-10-CM

## 2022-03-24 DIAGNOSIS — Z1379 Encounter for other screening for genetic and chromosomal anomalies: Secondary | ICD-10-CM

## 2022-03-24 LAB — POCT URINALYSIS DIPSTICK OB
Bilirubin, UA: NEGATIVE
Blood, UA: NEGATIVE
Glucose, UA: NEGATIVE
Leukocytes, UA: NEGATIVE
Nitrite, UA: NEGATIVE
Spec Grav, UA: 1.02 (ref 1.010–1.025)
Urobilinogen, UA: 0.2 E.U./dL
pH, UA: 6 (ref 5.0–8.0)

## 2022-03-24 NOTE — Progress Notes (Signed)
ROB. Patient states daily trouble with nausea and vomiting still, recently has been trying Zofran and Protonix. She also reports she has been dizzy and lightheaded lately, states it is while sitting or standing. AFP ordered. Patient states no questions or concerns at this time.

## 2022-03-24 NOTE — Progress Notes (Signed)
ROB: Continues to complain of nausea and vomiting daily.  Significant weight loss noted.  Large ketones today.  Patient states she keep down liquids without difficulty but has not been drinking today.  Pedialyte given discussed increased hydration.  Anatomy ultrasound ordered.  Strongly doubt chronic hypertension based on multiple office blood pressures and patient on no meds.

## 2022-03-25 ENCOUNTER — Telehealth: Payer: Self-pay

## 2022-03-25 NOTE — Telephone Encounter (Signed)
Pt calling; was seen yesterday; 18wks; c/o very severe n/v throughout whole preg; has heard about nausea pumps; doesn't know if we have them or not.  [phone]

## 2022-03-31 ENCOUNTER — Ambulatory Visit (HOSPITAL_BASED_OUTPATIENT_CLINIC_OR_DEPARTMENT_OTHER): Payer: BC Managed Care – PPO

## 2022-03-31 ENCOUNTER — Other Ambulatory Visit
Admission: RE | Admit: 2022-03-31 | Discharge: 2022-03-31 | Disposition: A | Discharge status: Home / Self Care | Payer: BC Managed Care – PPO | Source: Ambulatory Visit | Attending: Maternal & Fetal Medicine | Admitting: Maternal & Fetal Medicine

## 2022-03-31 ENCOUNTER — Other Ambulatory Visit: Payer: Self-pay

## 2022-03-31 DIAGNOSIS — O99212 Obesity complicating pregnancy, second trimester: Secondary | ICD-10-CM | POA: Diagnosis not present

## 2022-03-31 DIAGNOSIS — Z369 Encounter for antenatal screening, unspecified: Secondary | ICD-10-CM | POA: Diagnosis not present

## 2022-03-31 DIAGNOSIS — D6800 Von Willebrand disease, unspecified: Secondary | ICD-10-CM

## 2022-03-31 DIAGNOSIS — Z8349 Family history of other endocrine, nutritional and metabolic diseases: Secondary | ICD-10-CM

## 2022-03-31 DIAGNOSIS — Z3A18 18 weeks gestation of pregnancy: Secondary | ICD-10-CM

## 2022-03-31 DIAGNOSIS — O99112 Other diseases of the blood and blood-forming organs and certain disorders involving the immune mechanism complicating pregnancy, second trimester: Secondary | ICD-10-CM | POA: Diagnosis not present

## 2022-03-31 DIAGNOSIS — Z6837 Body mass index (BMI) 37.0-37.9, adult: Secondary | ICD-10-CM | POA: Insufficient documentation

## 2022-03-31 DIAGNOSIS — O10012 Pre-existing essential hypertension complicating pregnancy, second trimester: Secondary | ICD-10-CM

## 2022-03-31 DIAGNOSIS — Z8481 Family history of carrier of genetic disease: Secondary | ICD-10-CM

## 2022-03-31 NOTE — Patient Instructions (Signed)
Excuse from Work, [employer], or Physical Activity _______________________________________[name]________________ needs to be excused from: ___X_ Work. ____ School. ____ Physical activity. This is effective for the following dates: __11/21/23____________________________________. He or she may return to work or school, but should avoid physical activity or other activities from now until _______________. Activity restrictions include: ____ Lifting more than _________ lb / kg. ____ Sitting longer than __________ minutes at a time. ____ Standing longer than ________ minutes at a time. ____ Other activities including: ___________________________________________________________________________________ ____ He or she may return to full physical activity on ________________. Health care provider name (printed): _______Courtney Neva Seat, RN______________________________________________ Health care provider (signature): _________________________________________________________ Date: ________11/21/23______________________ This information is not intended to replace advice given to you by your health care provider. Make sure you discuss any questions you have with your health care provider. Document Revised: 01/15/2021 Document Reviewed: 01/15/2021 Elsevier Patient Education  2023 [employer].

## 2022-04-09 LAB — MISC LABCORP TEST (SEND OUT): Labcorp test code: 252823

## 2022-04-10 LAB — MISC LABCORP TEST (SEND OUT): Labcorp test code: 481776

## 2022-04-13 ENCOUNTER — Telehealth: Payer: Self-pay

## 2022-04-13 ENCOUNTER — Observation Stay: Payer: BC Managed Care – PPO

## 2022-04-13 ENCOUNTER — Observation Stay
Admission: EM | Admit: 2022-04-13 | Discharge: 2022-04-13 | Disposition: A | Discharge status: Home / Self Care | Payer: BC Managed Care – PPO | Attending: Obstetrics and Gynecology | Admitting: Obstetrics and Gynecology

## 2022-04-13 ENCOUNTER — Other Ambulatory Visit: Payer: Self-pay

## 2022-04-13 ENCOUNTER — Encounter: Payer: Self-pay | Admitting: Obstetrics and Gynecology

## 2022-04-13 DIAGNOSIS — O26892 Other specified pregnancy related conditions, second trimester: Secondary | ICD-10-CM | POA: Diagnosis present

## 2022-04-13 DIAGNOSIS — Z3A20 20 weeks gestation of pregnancy: Secondary | ICD-10-CM | POA: Diagnosis not present

## 2022-04-13 DIAGNOSIS — O212 Late vomiting of pregnancy: Secondary | ICD-10-CM | POA: Diagnosis not present

## 2022-04-13 DIAGNOSIS — N200 Calculus of kidney: Secondary | ICD-10-CM

## 2022-04-13 DIAGNOSIS — Z79899 Other long term (current) drug therapy: Secondary | ICD-10-CM | POA: Diagnosis not present

## 2022-04-13 DIAGNOSIS — R1031 Right lower quadrant pain: Secondary | ICD-10-CM | POA: Diagnosis not present

## 2022-04-13 DIAGNOSIS — R10A1 Flank pain, right side: Secondary | ICD-10-CM

## 2022-04-13 DIAGNOSIS — R109 Unspecified abdominal pain: Secondary | ICD-10-CM | POA: Insufficient documentation

## 2022-04-13 DIAGNOSIS — M549 Dorsalgia, unspecified: Secondary | ICD-10-CM | POA: Diagnosis present

## 2022-04-13 HISTORY — DX: Calculus of kidney: N20.0

## 2022-04-13 LAB — ALPHA-THALASSEMIA GENOTYPR

## 2022-04-13 LAB — URINALYSIS, COMPLETE (UACMP) WITH MICROSCOPIC
Bilirubin Urine: NEGATIVE
Glucose, UA: NEGATIVE mg/dL
Hgb urine dipstick: NEGATIVE
Ketones, ur: NEGATIVE mg/dL
Nitrite: NEGATIVE
Protein, ur: 30 mg/dL — AB
Specific Gravity, Urine: 1.017 (ref 1.005–1.030)
pH: 6 (ref 5.0–8.0)

## 2022-04-13 MED ORDER — LACTATED RINGERS IV BOLUS
1000.0000 mL | Freq: Once | INTRAVENOUS | Status: AC
  Administered 2022-04-13: 1000 mL via INTRAVENOUS

## 2022-04-13 NOTE — Final Progress Note (Signed)
OB/Triage Note  Patient ID: Tamara Mcclain MRN: [id number] DOB/AGE: 10/24/2000 21 y.o.  Subjective  History of Present Illness: The patient is a 21 y.o. female G3P0020 at [redacted]w[redacted]d who presents for right flank pain of one week. Has had pain to right flank for past week, pain was intermittent but is now constant dull/achy with some sharp shooting pain. Has not tried anything for pain, reports she's allergic to tylenol. Denies dysuria, frequency, hematuria, suprapubic pain, fever. Has had vomiting but has had that her whole pregnancy and is not different from her baseline.    Past Medical History:  Diagnosis Date   Anxiety    Epiploic appendagitis    MTHFR mutation    Von Willebrand disease (HCC) Dr. Janee Morn at [facility]    Past Surgical History:  Procedure Laterality Date   ESOPHAGOGASTRODUODENOSCOPY (EGD) WITH PROPOFOL N/A 01/30/2020   Procedure: ESOPHAGOGASTRODUODENOSCOPY (EGD) WITH PROPOFOL;  Surgeon: Wyline Mood, MD;  Location: [facility] ENDOSCOPY;  Service: Gastroenterology;  Laterality: N/A;   WISDOM TOOTH EXTRACTION  08/2018   no bleeding complications    No current facility-administered medications on file prior to encounter.   Current Outpatient Medications on File Prior to Encounter  Medication Sig Dispense Refill   Prenatal Vit-Fe Fumarate-FA (PRENATAL VITAMINS) 28-0.8 MG TABS Take 1 tablet by mouth daily.     albuterol (VENTOLIN HFA) 108 (90 Base) MCG/ACT inhaler Inhale 1-2 puffs into the lungs every 6 (six) hours as needed for wheezing or shortness of breath. 8 g 0   Doxylamine-Pyridoxine (DICLEGIS) 10-10 MG TBEC Take 4 tablets by mouth daily. Day 1: Take one tablet at bedtime. Day 2: Take one tablet in AM and one at bedtime. Day 3-4: If no relief, take 2 tablets at bedtime and one in AM. Day 5-6: If no relief, take 2 tablets in AM and 2 at bedtime. (Patient not taking: Reported on 04/13/2022) 60 tablet 2   ondansetron (ZOFRAN) 4 MG tablet Take 1 tablet (4 mg total) by mouth every 8  (eight) hours as needed for nausea or vomiting. (Patient not taking: Reported on 03/31/2022) 20 tablet 1   pantoprazole (PROTONIX) 40 MG tablet Take 1 tablet (40 mg total) by mouth daily. 30 tablet 0    Allergies  Allergen Reactions   Tylenol [Acetaminophen] Other (See Comments)    fever   Depo-Medrol [Methylprednisolone] Nausea Only    Feeling flushed    Social History   Socioeconomic History   Marital status: Single    Spouse name: Maelee Hoot.   Number of children: 0   Years of education: 12   Highest education level: Not on file  Occupational History   Occupation: Lot Production designer, theatre/television/film    Comment: Recruitment consultant [employer]  Tobacco Use   Smoking status: Never    Passive exposure: Past   Smokeless tobacco: Never  Vaping Use   Vaping Use: Never used  Substance and Sexual Activity   Alcohol use: No    Alcohol/week: 0.0 standard drinks of alcohol   Drug use: No   Sexual activity: Yes    Partners: Male    Birth control/protection: None  Other Topics Concern   Not on file  Social History Narrative   Not on file   Social Determinants of Health   Financial Resource Strain: Low Risk  (12/31/2021)   Overall Financial Resource Strain (CARDIA)    Difficulty of Paying Living Expenses: Not hard at all  Food Insecurity: Not on file  Transportation Needs: No Transportation Needs (12/31/2021)  PRAPARE - Administrator, Civil Service (Medical): No    Lack of Transportation (Non-Medical): No  Physical Activity: Sufficiently Active (12/31/2021)   Exercise Vital Sign    Days of Exercise per Week: 4 days    Minutes of Exercise per Session: 150+ min  Stress: No Stress Concern Present (12/31/2021)   [employer] of Occupational Health - Occupational Stress Questionnaire    Feeling of Stress : Not at all  Social Connections: Moderately Integrated (12/31/2021)   Social Connection and Isolation Panel [NHANES]    Frequency of Communication with Friends and Family: More than  three times a week    Frequency of Social Gatherings with Friends and Family: Three times a week    Attends Religious Services: More than 4 times per year    Active Member of Clubs or Organizations: No    Attends Banker Meetings: Never    Marital Status: Living with partner  Intimate Partner Violence: Not At Risk (12/31/2021)   Humiliation, Afraid, Rape, and Kick questionnaire    Fear of Current or Ex-Partner: No    Emotionally Abused: No    Physically Abused: No    Sexually Abused: No    Family History  Problem Relation Age of Onset   Hypertension Father    Pulmonary embolism Father    Cancer Maternal Grandmother 47       breast   Arthritis Maternal Grandmother    Hyperlipidemia Maternal Grandmother    CAD Maternal Grandfather    Cancer Paternal Grandmother        melanoma, sinus cancer   Colon polyps Paternal Grandmother    Heart disease Paternal Grandfather    Gaucher's disease Half-Brother    Esophageal cancer Neg Hx    Stomach cancer Neg Hx    Liver disease Neg Hx      ROS  Right flank pain  Objective  Physical Exam: BP 115/75 (BP Location: Left Arm)   Pulse 88   Temp 98.2 F (36.8 C) (Oral)   Resp 19   Ht 5' (1.524 m)   Wt 82.6 kg   LMP 11/13/2021 (Approximate)   SpO2 99%   BMI 35.54 kg/m   OBGyn Exam SVE deferred Right CVA tenderness, left non tender No suprapubic tenderness  FHT 146 via doppler Toco no contractions detected  Significant Findings/ Diagnostic Studies:  UA: small protein, mod leukocytes, neg nitrites Renal ultrasound: normal, no stones visualized  Hospital Course: The patient was admitted to [facility] Triage for observation. She received IV fluids for hydration while awaiting her kidney ultrasound and then report. While here her pain decreased although never fully resolved. She was offered pain medication but declined, states that taking new medications causes her anxiety. Had normal renal u/s. S/sx was most consistent with  kidney stones although none were visualized on u/s. Plan to discharge home and encourage hydration, instructed to come to clinic if pain is not managed. Consulted with Dr. Logan Bores who was in agreement with plan.  Assessment: 21 y.o. female G3P0020 at [redacted]w[redacted]d  Likely kidney stones  Plan: Discharge home Follow up 12/12 at [facility] Teaching: PTL and appendicitis/infection precautions discussed Come to clinic if pain is poorly controlled, ensure adequate hydration  Discharge Instructions     Discharge activity:  No Restrictions   Complete by: As directed    Discharge diet:  No restrictions   Complete by: As directed    Discharge instructions   Complete by: As directed    Keep ROB  visit on 12/12, call clinic if pain is not managed Return to [facility] triage for any signs of infection or preterm labor   Notify physician for a general feeling that "something is not right"   Complete by: As directed    Notify physician for increase or change in vaginal discharge   Complete by: As directed    Notify physician for intestinal cramps, with or without diarrhea, sometimes described as "gas pain"   Complete by: As directed    Notify physician for leaking of fluid   Complete by: As directed    Notify physician for low, dull backache, unrelieved by heat or Tylenol   Complete by: As directed    Notify physician for menstrual like cramps   Complete by: As directed    Notify physician for pelvic pressure   Complete by: As directed    Notify physician for uterine contractions.  These may be painless and feel like the uterus is tightening or the baby is  "balling up"   Complete by: As directed    Notify physician for vaginal bleeding   Complete by: As directed    PRETERM LABOR:  Includes any of the follwing symptoms that occur between 20 - [redacted] weeks gestation.  If these symptoms are not stopped, preterm labor can result in preterm delivery, placing your baby at risk   Complete by: As directed          Total  time spent taking care of this patient: 45 minutes  Signed: Raeford Razor CNM, FNP 04/13/2022, 11:15 PM

## 2022-04-13 NOTE — OB Triage Note (Addendum)
Pt presents c/o right sided flank pain since last week. Pt denies any urinary symptoms. Pt states its a constant pain. Pt states sometimes it's bolts of sharp pain. Pt has tried heating pads with no relief. Pt last intercourse was Saturday. Pt denies bleeding or LOF. Pt unable to feel fetal movement yet this pregnancy. Fetal heart tone of 142 bmp. VSS. Will continue to monitor.

## 2022-04-13 NOTE — Telephone Encounter (Signed)
Pt called our office requesting an appt for lower back pain and was told no appt's available today and was transferred to triage. She said she has been on/off back pain more towards the right not near spine. Denies any vaginal spotting/bleeding, leakage of fluid and any urinary sx. I advised her stay well hydrated and to monitor the lower back pain. If it becomes sharp or has any heavy severe pelvic pain or heavy vag bleeding to go ER.

## 2022-04-13 NOTE — OB Triage Note (Signed)
Patient being discharged home with husband. Patient given discharge instructions and was allowed opportunity to ask questions. All questions answered at this time. Patient's vs are good and fht is 179.

## 2022-04-14 ENCOUNTER — Telehealth: Payer: Self-pay | Admitting: Obstetrics and Gynecology

## 2022-04-14 NOTE — Telephone Encounter (Signed)
  We spoke with [name] to review that some of the results of the recent carrier screening are now available.  The patient elected to undergo carrier screening for the following conditions Cystic fibrosis (CF), spinal muscular atrophy (SMA) and Fragile X syndrome through the Inheritest screening panel.  To review, CF is a genetic condition that occurs most often in Caucasian persons.  It primarily affects the lungs, digestive, and reproductive systems.  For someone to be at risk for having CF, both of their parents must be carriers for CF.  The testing can detect many persons who are carriers for CF and therefore determine if the pregnancy is at an increased risk for this condition.  The blood test results were negative for changes in the gene for CF using sequencing technology. Because this testing cannot detect all changes that may cause CF, we cannot eliminate the chance that this individual is a carrier completely.  SMA is also a recessive genetic condition with variable age of onset and severity caused by mutations in the SMN1 gene.  This carrier testing assesses the number of copies of this gene.  Persons with one copy of the SMN1 gene are carriers, and those with no copies are affected with the condition.  Individuals with two or more copies have a reduced chance to be a carrier.  Not all mutations can be detected with this testing, though it can detect 94.8% of carriers in the Caucasian population.  The results revealed that [name] has an SMN1 copy number of 3, which greatly reduces the chance that she is a carrier.  Again, this testing cannot eliminate the chance to have a child with SMA, but dramatically reduces the chance.    Fragile X carrier screening was also made available.  Fragile X syndrome is the most common inherited cause of intellectual disabilities and often has autism as a feature.  It is caused by a change, or expansion, of the DNA in the gene FMR1.  Carrier screening is used to  determine the number of repeats in this region of the gene and to determine the likelihood of having a child with this condition. [name]'s results showed copy numbers of 31 and 31, which are well within the normal range.  This negative result means that [name] is not at risk for having a child with Fragile X syndrome, but cannot exclude other causes for autism or intellectual delays.  Alpha thalassemia carrier testing was also normal, indicating that this patiet is not a carrier for this type of inherited anemia.  The other two tests we ordered are still pending:  beta thalassemia carrier testing and Gaucher disease gene sequencing.   Per the lab on 04/14/22, the initial test requisition was cancelled by [facility] (reason unknown) so the second tube of blood was sent to [facility] today for the testing.  We have requested expedited processing given the lab error and the ongoing pregnancy.  Results will be called to the patient as soon as they become available.   We encouraged the patient to call with any questions or concerns as they arise.  We may be reached at [phone].  Cherly Anderson, MS, CGC

## 2022-04-14 NOTE — Progress Notes (Addendum)
Referring provider:  [facility] Length of consultation:  45 minutes  [name] was referred for genetic counseling at [facility] due to a family history of Gaucher disease.  She was present for this visit alone and was counseled by Tamara Mcclain, genetic counseling intern, supervised by Tamara Stack, MS, CGC.  We first obtained a detailed family history and pregnancy history. This is third pregnancy for Tamara Mcclain and her partner, Tamara Mcclain.  They have experienced two early miscarriages prior to this pregnancy.  She reports no complications in this pregnancy or exposures to medications, alcohol, tobacco or recreational drugs.  We spoke briefly about the prior losses and the fact that there may be many causes for pregnancy loss including chromosome aneuploidy, structural uterine anomalies, inherited or acquired blood clotting conditions, structural chromosome rearrangements and other causes. Approximately 2-5% of couples experiencing recurrent pregnancy loss will have an identifiable chromosomal rearrangement. We therefore offered the option of parental chromosomal analysis. After considering this testing option, Tamara Mcclain elected to check with her insurance before deciding whether she would like to have karyotype analysis done.   In the family history, Tamara Mcclain reported that her paternal half brother is affected with Gaucher disease.  His clinical features are most consistent with type I, as he presented with a leg fracture at the age of 6 years and currently at 22 years old, he is doing well with treatments every 2 weeks. He has no neurological impairment. The patient reports that her father is a known carrier, though we do not have documentation of genetic testing on either her father or brother to confirm.  If their father is a carrier, then [name] has a chance of 1 in 2 (50%) to be a carrier. In addition, Tamara Mcclain's paternal grandfather is reported to have Parkinson disease.  Tamara Mcclain  parents both passed away in their 61s, his father with pancreatic cancer.  We reviewed that while most cancers occur by chance, some cancers may have string inherited components in some families. The remainder of the family history was unremarkable for birth defects, intellectual disability, other persons with recurrent pregnancy loss, or known genetic conditions.  Gaucher disease is an inherited disorder that is caused by a buildup of glucocerebroside in certain organs, particularly the spleen and liver. The symptoms of Gaucher vary widely among affected individuals and several types of the condition have been identified based characteristic features. Type I Gaucher disease is the most common and is characterized by hepatosplenomegaly, anemia, easy bruising, thrombocytopenia, lung disease, bone pain, fractures, and arthritis, with sparing of the brain and spinal cord. Symptoms may be mild to severe and may present anytime from childhood to adulthood. Generally, individuals with Type I have a normal lifespan and excellent quality of life with treatment via enzyme replacement therapy or substrate reduction therapy. Type II and Type III Gaucher disease are characterized by the same symptoms as Type I, with the addition of central nervous system features (abnormal eye movements, seizures, and brain damage). Individuals with Type II typically have symptoms which appear in infancy, and affected children typically pass away within the first two years of life. Type II disease is currently untreatable due to the severe, irreversible brain damage. Type III disease has a severity between Type I and Type II. Some individuals with Type III can live into their 22s with treatment. Other rarer forms of Gaucher disease, such as a perinatal lethal form and cardiovascular form have also been described. It is currently not possible to  predict which type of the condition a person will have based upon genetic testing alone.    Gaucher disease is caused by pathogenic variants in the GBA gene. This gene provides instructions for making an enzyme called beta-glucocerebrosidase. This enzyme is active in lysosomes, which use digestive enzymes to break down toxic substances in the body. The beta-glucocerebrosidase enzyme is required to metabolize glucocerebroside and break it down into two different components, glucose and ceramide (a simpler fat molecule). Pathogenic variants in the GBA gene cause a deficiency of the beta-glucocerebrosidase enzyme which leads to a buildup of glucocerebroside in toxic levels within various tissues and organs in the body.   We discussed that Gaucher disease has autosomal recessive inheritance and that the current pregnancy would be at risk to be affected if Tamara Mcclain and Tamara Mcclain are both carriers for the condition. Then the risk for Gaucher in the current pregnancy would be 1 in 4 (25%). If both members of a couple are known to be carriers, diagnostic testing is available to determine if the pregnancy is affected. This can be performed via CVS at 10-[redacted] weeks gestation or amniocentesis after [redacted] weeks gestation. CVS involves the withdrawal of a small amount of chorionic villi (tissue from the developing placenta). Amniocentesis involves the removal of a small amount of amniotic fluid from the sac surrounding the pregnancy. Ultrasound guidance is used throughout both procedures. Possible procedural difficulties and complications that can arise with these procedures include maternal infection, cramping, bleeding, fluid leakage, and/or pregnancy loss. The risk for pregnancy loss with a CVS/amniocentesis is 1/500. If diagnostic testing via CVS/amniocentesis is not desired, Gaucher testing can be completed after birth.   In addition, recent studies suggest that being a carrier of a variant in the GBA gene may be a risk factor for developing Parkinson's disease in late adulthood. Parkinson's disease is a progressive  nervous system disorder that affects movement. Tremors are common, but the disorder can also cause stiffness, slowing of movement, rigid muscles, impaired posture and balance, and loss of automatic movements. We reviewed that while there may be increased chance for carriers to develop Parkinson's disease in late adulthood, most carriers never develop this condition.   Other testing discussed for this pregnancy included: Cell free DNA testing for chromosome conditions through [facility] was already performed as was negative for Trisomy 13, 18 and 21 and revealed the fetal gender to be female. This greatly reduces the chance that this pregnancy has one of these common aneuploidies.  See report for residual risks and detection rates. Gaucher enzyme testing was ordered by her provider.  This testing did not show her to be affected with Gaucher disease, but it is not a helpful test in determining carrier status.  Therefore, we offered to repeat this testing with sequencing of the GBA gene for variants that are known to be associated with this condition (GeneSeq). Ms. Ladona Ridgel inquired about other carrier screening options.  We reviewed the ACOG recommendations for screening for cystic fibrosis, spinal muscular atrophy and hemoglobinopathies.  The patient elected to proceed with these carrier tests today.  Plan of care: Labs drawn today for Gaucher gene sequencing and carrier screening for CF, SMA, hemoglobinopathies. Review pregnancy testing options as well as testing for Tamara Mcclain as needed based upon Tamara Mcclain's results. msAFP screening if desired at [facility] for open neural tube defects.  We appreciate being involved in the care of this family and can be reached at [phone] with any questions or concerns.  Cherly Anderson, MS, CGC

## 2022-04-21 ENCOUNTER — Encounter: Payer: Self-pay | Admitting: Advanced Practice Midwife

## 2022-04-21 ENCOUNTER — Ambulatory Visit (INDEPENDENT_AMBULATORY_CARE_PROVIDER_SITE_OTHER): Payer: BC Managed Care – PPO | Admitting: Advanced Practice Midwife

## 2022-04-21 ENCOUNTER — Ambulatory Visit: Payer: BC Managed Care – PPO

## 2022-04-21 ENCOUNTER — Other Ambulatory Visit: Payer: Self-pay | Admitting: Obstetrics and Gynecology

## 2022-04-21 VITALS — BP 130/80 | HR 86 | Wt 181.0 lb

## 2022-04-21 DIAGNOSIS — O10912 Unspecified pre-existing hypertension complicating pregnancy, second trimester: Secondary | ICD-10-CM

## 2022-04-21 DIAGNOSIS — O0992 Supervision of high risk pregnancy, unspecified, second trimester: Secondary | ICD-10-CM

## 2022-04-21 DIAGNOSIS — Z3A21 21 weeks gestation of pregnancy: Secondary | ICD-10-CM

## 2022-04-21 DIAGNOSIS — O0990 Supervision of high risk pregnancy, unspecified, unspecified trimester: Secondary | ICD-10-CM | POA: Insufficient documentation

## 2022-04-21 DIAGNOSIS — Z1379 Encounter for other screening for genetic and chromosomal anomalies: Secondary | ICD-10-CM

## 2022-04-21 DIAGNOSIS — Z3402 Encounter for supervision of normal first pregnancy, second trimester: Secondary | ICD-10-CM

## 2022-04-21 DIAGNOSIS — Z3A17 17 weeks gestation of pregnancy: Secondary | ICD-10-CM

## 2022-04-21 NOTE — Progress Notes (Signed)
Routine Prenatal Care Visit  Subjective  Tamara Mcclain is a 21 y.o. G3P0020 at [redacted]w[redacted]d being seen today for ongoing prenatal care.  She is currently monitored for the following issues for this high-risk pregnancy and has Epiploic appendagitis; Panic attacks; Class 2 obesity due to excess calories with body mass index (BMI) of 37.0 to 37.9 in adult; Asthma; Chronic hypertension during pregnancy, antepartum; Family history of carrier of genetic disease; Back pain; Acute right flank pain; and Supervision of high risk pregnancy, antepartum on their problem list.  ----------------------------------------------------------------------------------- Patient reports mid/low right side back pain since last week. She was seen in the hospital- no hydronephrosis or stones seen on ultrasound although s/s most consistent with stones. She reports some improvement of pain since then and she is trying to increase hydration. She has f/u u/s with MFM next week. Contractions: Not present. Vag. Bleeding: None.  Movement: Absent. Leaking Fluid denies.  ----------------------------------------------------------------------------------- The following portions of the patient's history were reviewed and updated as appropriate: allergies, current medications, past family history, past medical history, past social history, past surgical history and problem list. Problem list updated.  Objective  Blood pressure 130/80, pulse 86, weight 181 lb (82.1 kg), last menstrual period 11/13/2021. Pregravid weight 190 lb (86.2 kg) Total Weight Gain -9 lb (-4.082 kg) Urinalysis: Urine Protein    Urine Glucose    Fetal Status: Fetal Heart Rate (bpm): 147 Fundal Height: 22 cm Movement: Absent     General:  Alert, oriented and cooperative. Patient is in no acute distress.  Skin: Skin is warm and dry. No rash noted.   Cardiovascular: Normal heart rate noted  Respiratory: Normal respiratory effort, no problems with respiration noted   Abdomen: Soft, gravid, appropriate for gestational age. Pain/Pressure: Absent     Pelvic:  Cervical exam deferred        Extremities: Normal range of motion.  Edema: None  Mental Status: Normal mood and affect. Normal behavior. Normal judgment and thought content.   Assessment   21 y.o. G3P0020 at [redacted]w[redacted]d by  08/28/2022, by Ultrasound presenting for routine prenatal visit  Plan   third Problems (from 12/31/21 to present)     No problems associated with this episode.        Preterm labor symptoms and general obstetric precautions including but not limited to vaginal bleeding, contractions, leaking of fluid and fetal movement were reviewed in detail with the patient. Please refer to After Visit Summary for other counseling recommendations.   Return in about 4 weeks (around 05/19/2022) for rob.  Tresea Mall, CNM 04/21/2022 3:16 PM

## 2022-04-21 NOTE — Assessment & Plan Note (Signed)
  Clinical Staff Provider  Office Location  [facility] Dating  08/28/2022, by Ultrasound  Language  English Anatomy US    Flu Vaccine  Declined  Genetic Screen  NIPS:   TDaP vaccine    Hgb A1C or  GTT Early : Third trimester :   Covid    LAB RESULTS   Rhogam  --/--/A POS Performed at [facility], [address] [place], [place] [address]  (09/25 1002)  Blood Type --/--/A POS Performed at [facility], [address] [place], [place] [address]  [id number]/25 1002)   Feeding Plan  Antibody Negative (09/06 1413)  Contraception  Rubella 1.35 (09/06 1413)  Circumcision  RPR Non Reactive (09/06 1413)   Pediatrician   HBsAg Negative (09/06 1413)   Support Person  HIV Non Reactive (09/06 1413)  Prenatal Classes  Varicella     GBS  (For PCN allergy, check sensitivities)   BTL Consent  Hep C Non Reactive (09/06 1413)   VBAC Consent  Pap Diagnosis  Date Value Ref Range Status  09/05/2020   Final   - Negative for intraepithelial lesion or malignancy (NILM)      Hgb Electro      CF      SMA

## 2022-04-23 ENCOUNTER — Other Ambulatory Visit: Payer: Self-pay

## 2022-04-23 DIAGNOSIS — Z8481 Family history of carrier of genetic disease: Secondary | ICD-10-CM

## 2022-04-23 DIAGNOSIS — O10919 Unspecified pre-existing hypertension complicating pregnancy, unspecified trimester: Secondary | ICD-10-CM

## 2022-04-24 LAB — MISC LABCORP TEST (SEND OUT): Labcorp test code: 831820

## 2022-04-28 ENCOUNTER — Other Ambulatory Visit: Payer: Self-pay

## 2022-04-28 ENCOUNTER — Telehealth: Payer: Self-pay

## 2022-04-28 ENCOUNTER — Ambulatory Visit: Payer: BC Managed Care – PPO | Attending: Maternal & Fetal Medicine

## 2022-04-28 ENCOUNTER — Ambulatory Visit: Payer: Self-pay | Admitting: Maternal & Fetal Medicine

## 2022-04-28 ENCOUNTER — Encounter: Payer: Self-pay | Admitting: Internal Medicine

## 2022-04-28 ENCOUNTER — Telehealth: Payer: Self-pay | Admitting: Obstetrics and Gynecology

## 2022-04-28 DIAGNOSIS — O10919 Unspecified pre-existing hypertension complicating pregnancy, unspecified trimester: Secondary | ICD-10-CM

## 2022-04-28 DIAGNOSIS — Z8481 Family history of carrier of genetic disease: Secondary | ICD-10-CM | POA: Diagnosis not present

## 2022-04-28 DIAGNOSIS — O99112 Other diseases of the blood and blood-forming organs and certain disorders involving the immune mechanism complicating pregnancy, second trimester: Secondary | ICD-10-CM | POA: Insufficient documentation

## 2022-04-28 DIAGNOSIS — D6800 Von Willebrand disease, unspecified: Secondary | ICD-10-CM | POA: Insufficient documentation

## 2022-04-28 DIAGNOSIS — O99212 Obesity complicating pregnancy, second trimester: Secondary | ICD-10-CM | POA: Diagnosis not present

## 2022-04-28 DIAGNOSIS — O10012 Pre-existing essential hypertension complicating pregnancy, second trimester: Secondary | ICD-10-CM

## 2022-04-28 DIAGNOSIS — O10912 Unspecified pre-existing hypertension complicating pregnancy, second trimester: Secondary | ICD-10-CM | POA: Diagnosis not present

## 2022-04-28 DIAGNOSIS — Z362 Encounter for other antenatal screening follow-up: Secondary | ICD-10-CM | POA: Diagnosis present

## 2022-04-28 DIAGNOSIS — Z3A22 22 weeks gestation of pregnancy: Secondary | ICD-10-CM | POA: Insufficient documentation

## 2022-04-28 DIAGNOSIS — E669 Obesity, unspecified: Secondary | ICD-10-CM

## 2022-04-28 MED ORDER — ALBUTEROL SULFATE HFA 108 (90 BASE) MCG/ACT IN AERS: 1.0000 | INHALATION_SPRAY | Freq: Four times a day (QID) | RESPIRATORY_TRACT | 1 refills | Status: DC | PRN

## 2022-04-28 NOTE — Telephone Encounter (Signed)
Pt calling to see of it is okay for her to get a flue shot at a local pharmacy.  [phone]  Left detailed msg that we can give her a flu shot at her next appt; she doesn't need to go to a local pharm.

## 2022-04-28 NOTE — Telephone Encounter (Signed)
I spoke with Tamara Mcclain to review the carrier testing results for Gaucher disease and beta hemoglobinopathies (HBB gene).  The HBB testing was negative, indicating that she is not a carrier for any changes in that gene, which includes sickle cell disease, beta thalassemia, and other inherited anemias.  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 We reviewed that because of the recessive inheritance of this condition, we would recommend testing of her partner, Tamara Mcclain, for changes in this gene as well.  Currently, Tamara Mcclain has a 1 in 77 chance to be a carrier given that he has no family history of the condition and no Jewish ancestry.  Without any additional testing, we would estimate that this pregnancy has a chance of 1 in 308 to be affected with Gaucher disease (1X1/77X1/4). If we know both parents are carriers, this chance would increase to 1 in 4 and prenatal diagnosis would be available to test the fetus if desired. This testing could be performed through amniocentesis, which we discussed in detail previously.  Alternatively, testing of the baby after delivery could be done if Tamara Mcclain does not wish to be tested or if the couple are both carriers but do not prefer invasive prenatal diagnosis. If Tamara Mcclain is not a carrier, then each of Tamara Mcclain' children will have a 1/2 (50%) chance to be a carrier.  We reviewed again that there are data to suggest that carriers of variants in the GBA gene have an increased chance to develop Parkinson disease in late adulthood.  Parkinson disease is a progressive nervous system disorder that affects movement. Tremors are common, but the disorder can also cause stiffness, slowing of movement, rigid muscles, impaired posture and balance, and loss of automatic movements. We reviewed that while there may be increased chance for carriers to develop Parkinson disease in late  adulthood, most carriers never develop this condition.    Plan of care: Tamara Mcclain will speak with her partner, Tamara Mcclain, about the option of carrier testing for him.  She stated that she would contact our clinic later this week with his decision.  If desired, we will arrange a lab visit for him.  If they decline his testing, then we can plan for testing this baby after delivery.  We may be reached at [phone].  Cherly Anderson, MS, CGC

## 2022-05-11 NOTE — L&D Delivery Note (Addendum)
Date of delivery: 08/23/2022 Estimated Date of Delivery: 08/28/22 Patient's last menstrual period was 11/13/2021 (approximate). EGA: [redacted]w[redacted]d  Delivery Note At 7:39 PM a viable female was delivered via Vaginal, Vacuum Extractor  Presentation: OA, LOA  APGAR: 8, 9     Weight: 2580 g, 5 pounds 11 ounces Placenta status: Spontaneous, Intact.   Cord: 3 vessels with the following complications: None.  Cord pH: NA  Called to see patient for fetal heart rate decelerations. Position changes and fluid bolus in progress. She was found to be complete. Decelerations initially resolved. Then despite further position changes decelerations persisted. Discussed use of vacuum to assist delivery which patient and her husband agreed to verbally. Dr Valentino Saxon was notified of situation. Vacuum applied and while mom pushed over 2 contractions- with no pop offs- a viable female infant was delivered.  The head followed by shoulders, which delivered without difficulty, and the rest of the body.  No nuchal cord noted.  Baby to mom's chest.  Cord clamped and cut after 5 min delay.  No cord blood obtained.  Placenta delivered spontaneously, intact, with a 3-vessel cord.  All counts correct.  Hemostasis obtained with IV pitocin and fundal massage.   Of note: there was no obvious ROM during labor and no fluid at delivery.      Anesthesia: Epidural Episiotomy: None Lacerations: None Suture Repair:  NA Est. Blood Loss (mL): 50  Mom to postpartum.  Baby to Couplet care / Skin to Skin.  Tresea Mall, CNM 08/23/2022, 8:17 PM

## 2022-05-19 ENCOUNTER — Ambulatory Visit (INDEPENDENT_AMBULATORY_CARE_PROVIDER_SITE_OTHER): Payer: BC Managed Care – PPO | Admitting: Obstetrics

## 2022-05-19 VITALS — BP 115/81 | HR 97 | Wt 181.0 lb

## 2022-05-19 DIAGNOSIS — Z23 Encounter for immunization: Secondary | ICD-10-CM | POA: Diagnosis not present

## 2022-05-19 DIAGNOSIS — Z3A25 25 weeks gestation of pregnancy: Secondary | ICD-10-CM

## 2022-05-19 DIAGNOSIS — O2612 Low weight gain in pregnancy, second trimester: Secondary | ICD-10-CM

## 2022-05-19 LAB — POCT URINALYSIS DIPSTICK OB
Bilirubin, UA: NEGATIVE
Blood, UA: NEGATIVE
Glucose, UA: NEGATIVE
Ketones, UA: NEGATIVE
Leukocytes, UA: NEGATIVE
Nitrite, UA: NEGATIVE
POC,PROTEIN,UA: NEGATIVE
Spec Grav, UA: 1.02 (ref 1.010–1.025)
Urobilinogen, UA: 0.2 E.U./dL — AB
pH, UA: 7.5 (ref 5.0–8.0)

## 2022-05-19 MED ORDER — PROMETHAZINE HCL 25 MG PO TABS: 25.0000 mg | ORAL_TABLET | Freq: Four times a day (QID) | ORAL | 1 refills | Status: DC | PRN

## 2022-05-19 MED ORDER — METOCLOPRAMIDE HCL 10 MG PO TABS: 10.0000 mg | ORAL_TABLET | Freq: Four times a day (QID) | ORAL | 0 refills | Status: DC

## 2022-05-19 NOTE — Progress Notes (Signed)
ROB at 

## 2022-05-19 NOTE — Addendum Note (Signed)
Addended by: Landis Gandy on: 05/19/2022 01:47 PM   Modules accepted: Orders

## 2022-05-19 NOTE — Progress Notes (Signed)
ROB at [redacted]w[redacted]d. Feeling fetal movement! Denies ctx, LOF, and vaginal bleeding. F/u US shows no VSD. Tamara Mcclain is still vomiting daily and has not gained any weight this pregnancy. Zofran and Diclegis have not helped. Will try Phenergan and Reglan. Referral to nutritionist sent. She has a growth scan scheduled with MFM on 06/09/22. Discussed labs at next visit. Alternate glucose handout given. Desires flu shot today. RTC in 3 weeks.  Lloyd Huger, CNM

## 2022-05-28 ENCOUNTER — Observation Stay
Admission: EM | Admit: 2022-05-28 | Discharge: 2022-05-28 | Disposition: A | Discharge status: Home / Self Care | Payer: BC Managed Care – PPO | Attending: Obstetrics | Admitting: Obstetrics

## 2022-05-28 ENCOUNTER — Other Ambulatory Visit: Payer: Self-pay

## 2022-05-28 ENCOUNTER — Encounter: Payer: Self-pay | Admitting: Obstetrics

## 2022-05-28 ENCOUNTER — Telehealth: Payer: Self-pay | Admitting: Obstetrics and Gynecology

## 2022-05-28 ENCOUNTER — Ambulatory Visit (INDEPENDENT_AMBULATORY_CARE_PROVIDER_SITE_OTHER): Payer: BC Managed Care – PPO | Admitting: Licensed Practical Nurse

## 2022-05-28 VITALS — BP 117/81 | HR 100 | Wt 181.8 lb

## 2022-05-28 DIAGNOSIS — R42 Dizziness and giddiness: Secondary | ICD-10-CM | POA: Insufficient documentation

## 2022-05-28 DIAGNOSIS — Z3A26 26 weeks gestation of pregnancy: Secondary | ICD-10-CM

## 2022-05-28 DIAGNOSIS — O99891 Other specified diseases and conditions complicating pregnancy: Secondary | ICD-10-CM | POA: Diagnosis not present

## 2022-05-28 DIAGNOSIS — R519 Headache, unspecified: Secondary | ICD-10-CM | POA: Insufficient documentation

## 2022-05-28 DIAGNOSIS — O0992 Supervision of high risk pregnancy, unspecified, second trimester: Secondary | ICD-10-CM | POA: Insufficient documentation

## 2022-05-28 DIAGNOSIS — O26892 Other specified pregnancy related conditions, second trimester: Secondary | ICD-10-CM | POA: Diagnosis not present

## 2022-05-28 DIAGNOSIS — Z79899 Other long term (current) drug therapy: Secondary | ICD-10-CM | POA: Diagnosis not present

## 2022-05-28 LAB — CBC
HCT: 33.4 % — ABNORMAL LOW (ref 36.0–46.0)
Hemoglobin: 11.4 g/dL — ABNORMAL LOW (ref 12.0–15.0)
MCH: 29.3 pg (ref 26.0–34.0)
MCHC: 34.1 g/dL (ref 30.0–36.0)
MCV: 85.9 fL (ref 80.0–100.0)
Platelets: 286 10*3/uL (ref 150–400)
RBC: 3.89 MIL/uL (ref 3.87–5.11)
RDW: 14.2 % (ref 11.5–15.5)
WBC: 13.2 10*3/uL — ABNORMAL HIGH (ref 4.0–10.5)
nRBC: 0 % (ref 0.0–0.2)

## 2022-05-28 LAB — GLUCOSE, CAPILLARY: Glucose-Capillary: 88 mg/dL (ref 70–99)

## 2022-05-28 LAB — POCT URINALYSIS DIPSTICK
Blood, UA: NEGATIVE
Glucose, UA: NEGATIVE
Ketones, UA: NEGATIVE
Nitrite, UA: NEGATIVE
Protein, UA: POSITIVE — AB
Spec Grav, UA: 1.015 (ref 1.010–1.025)
Urobilinogen, UA: 0.2 E.U./dL
pH, UA: 6.5 (ref 5.0–8.0)

## 2022-05-28 MED ORDER — LACTATED RINGERS IV SOLN: INTRAVENOUS | Status: DC

## 2022-05-28 MED ORDER — MAGNESIUM OXIDE -MG SUPPLEMENT 400 (240 MG) MG PO TABS
400.0000 mg | ORAL_TABLET | Freq: Once | ORAL | Status: AC
  Administered 2022-05-28: 400 mg via ORAL
  Filled 2022-05-28: qty 1

## 2022-05-28 MED ORDER — METOCLOPRAMIDE HCL 10 MG PO TABS
10.0000 mg | ORAL_TABLET | Freq: Once | ORAL | Status: AC
  Administered 2022-05-28: 10 mg via ORAL
  Filled 2022-05-28: qty 1

## 2022-05-28 NOTE — Final Progress Note (Signed)
Final Progress Note  Patient ID: Tamara Mcclain MRN: [id number] DOB/AGE: [birth year] 22 y.o.  Admit date: 05/28/2022 Admitting provider: Mirna Mires, CNM Discharge date: 05/28/2022   Admission Diagnoses: dizzy in pregnancy [redacted] weeks gestation  Discharge Diagnoses:  Principal Problem:   [redacted] weeks gestation of pregnancy  Reactive fetal heart tones  History of Present Illness: The patient is a 22 y.o. female G3P0020 at [redacted]w[redacted]d who presents for evaluation of symptoms of dizziness and headache. She presented to the [facility] office, and was sent over for IV fluids per Dr. Logan Bores. She c/o several days of on again, off again headache, and today felt dizzy. She is not presently working . Her baby is moving well, and she denies LOF, vaginal bleeding or contractions.due to an allergy to tylenol, she has not tried anything for headaches this pregnancy.  Past Medical History:  Diagnosis Date   Anxiety    Epiploic appendagitis    Kidney stones 04/13/2022   Right Flank   MTHFR mutation    Von Willebrand disease (HCC) Dr. Janee Morn at [facility]    Past Surgical History:  Procedure Laterality Date   ESOPHAGOGASTRODUODENOSCOPY (EGD) WITH PROPOFOL N/A 01/30/2020   Procedure: ESOPHAGOGASTRODUODENOSCOPY (EGD) WITH PROPOFOL;  Surgeon: Wyline Mood, MD;  Location: [facility] ENDOSCOPY;  Service: Gastroenterology;  Laterality: N/A;   WISDOM TOOTH EXTRACTION  08/2018   no bleeding complications    No current facility-administered medications on file prior to encounter.   Current Outpatient Medications on File Prior to Encounter  Medication Sig Dispense Refill   albuterol (VENTOLIN HFA) 108 (90 Base) MCG/ACT inhaler Inhale 1-2 puffs into the lungs every 6 (six) hours as needed for wheezing or shortness of breath. 8 g 1   Prenatal Vit-Fe Fumarate-FA (PRENATAL VITAMINS) 28-0.8 MG TABS Take 1 tablet by mouth daily.     metoCLOPramide (REGLAN) 10 MG tablet Take 1 tablet (10 mg total) by mouth 4 (four) times daily  for 12 doses. 12 tablet 0   ondansetron (ZOFRAN) 4 MG tablet Take 1 tablet (4 mg total) by mouth every 8 (eight) hours as needed for nausea or vomiting. (Patient not taking: Reported on 05/28/2022) 20 tablet 1   promethazine (PHENERGAN) 25 MG tablet Take 1 tablet (25 mg total) by mouth every 6 (six) hours as needed for nausea or vomiting. (Patient not taking: Reported on 05/28/2022) 30 tablet 1    Allergies  Allergen Reactions   Tylenol [Acetaminophen] Other (See Comments)    fever   Depo-Medrol [Methylprednisolone] Nausea Only    Feeling flushed    Social History   Socioeconomic History   Marital status: Single    Spouse name: Emmory Solivan.   Number of children: 0   Years of education: 12   Highest education level: Not on file  Occupational History   Occupation: Lot Production designer, theatre/television/film    Comment: Recruitment consultant [employer]  Tobacco Use   Smoking status: Never    Passive exposure: Past   Smokeless tobacco: Never  Vaping Use   Vaping Use: Never used  Substance and Sexual Activity   Alcohol use: No    Alcohol/week: 0.0 standard drinks of alcohol   Drug use: No   Sexual activity: Yes    Partners: Male    Birth control/protection: None  Other Topics Concern   Not on file  Social History Narrative   Not on file   Social Determinants of Health   Financial Resource Strain: Low Risk  (12/31/2021)   Overall Physicist, medical Strain (  CARDIA)    Difficulty of Paying Living Expenses: Not hard at all  Food Insecurity: Not on file  Transportation Needs: No Transportation Needs (12/31/2021)   PRAPARE - Transportation    Lack of Transportation (Medical): No    Lack of Transportation (Non-Medical): No  Physical Activity: Sufficiently Active (12/31/2021)   Exercise Vital Sign    Days of Exercise per Week: 4 days    Minutes of Exercise per Session: 150+ min  Stress: No Stress Concern Present (12/31/2021)   [place]     Feeling of Stress : Not at all  Social Connections: Moderately Integrated (12/31/2021)   Social Connection and Isolation Panel [NHANES]    Frequency of Communication with Friends and Family: More than three times a week    Frequency of Social Gatherings with Friends and Family: Three times a week    Attends Religious Services: More than 4 times per year    Active Member of Clubs or Organizations: No    Attends Archivist Meetings: Never    Marital Status: Living with partner  Intimate Partner Violence: Not At Risk (12/31/2021)   Humiliation, Afraid, Rape, and Kick questionnaire    Fear of Current or Ex-Partner: No    Emotionally Abused: No    Physically Abused: No    Sexually Abused: No    Family History  Problem Relation Age of Onset   Hypertension Father    Pulmonary embolism Father    Cancer Maternal Grandmother 17       breast   Arthritis Maternal Grandmother    Hyperlipidemia Maternal Grandmother    CAD Maternal Grandfather    Cancer Paternal Grandmother        melanoma, sinus cancer   Colon polyps Paternal Grandmother    Heart disease Paternal Grandfather    Gaucher's disease Half-Brother    Esophageal cancer Neg Hx    Stomach cancer Neg Hx    Liver disease Neg Hx      Review of Systems  Constitutional: Negative.   HENT: Negative.    Eyes: Negative.   Respiratory: Negative.    Cardiovascular: Negative.   Gastrointestinal: Negative.   Genitourinary: Negative.   Musculoskeletal: Negative.   Skin: Negative.   Neurological:  Positive for dizziness and headaches.       Headache on and off this week. She is allergic to Tylenol, so has not self treated.  Endo/Heme/Allergies: Negative.   Psychiatric/Behavioral: Negative.    All other systems reviewed and are negative.    Physical Exam: BP 111/75 (BP Location: Right Arm)   Pulse 96   Temp 98.1 F (36.7 C) (Oral)   Resp 18   Ht 5' (1.524 m)   Wt 82.5 kg   LMP 11/13/2021 (Approximate)   SpO2 99%   BMI  35.50 kg/m   Physical Exam Constitutional:      Appearance: Normal appearance. She is normal weight.  Genitourinary:     Genitourinary Comments: deferred  HENT:     Head: Normocephalic and atraumatic.  Cardiovascular:     Rate and Rhythm: Normal rate and regular rhythm.     Pulses: Normal pulses.     Heart sounds: Normal heart sounds.  Pulmonary:     Effort: Pulmonary effort is normal.     Breath sounds: Normal breath sounds.  Abdominal:     Comments: Gravid.  Musculoskeletal:        General: Normal range of motion.  Cervical back: Normal range of motion and neck supple.  Neurological:     General: No focal deficit present.     Mental Status: She is alert and oriented to person, place, and time.  Skin:    General: Skin is warm and dry.  Psychiatric:        Mood and Affect: Mood normal.        Behavior: Behavior normal.     Consults: None  Significant Findings/ Diagnostic Studies: labs:  Results for orders placed or performed during the hospital encounter of 05/28/22 (from the past 24 hour(s))  Glucose, capillary     Status: None   Collection Time: 05/28/22  5:14 PM  Result Value Ref Range   Glucose-Capillary 88 70 - 99 mg/dL   Comment 1 Notify RN   CBC     Status: Abnormal   Collection Time: 05/28/22  5:18 PM  Result Value Ref Range   WBC 13.2 (H) 4.0 - 10.5 K/uL   RBC 3.89 3.87 - 5.11 MIL/uL   Hemoglobin 11.4 (L) 12.0 - 15.0 g/dL   HCT 34.1 (L) 96.2 - 22.9 %   MCV 85.9 80.0 - 100.0 fL   MCH 29.3 26.0 - 34.0 pg   MCHC 34.1 30.0 - 36.0 g/dL   RDW 79.8 92.1 - 19.4 %   Platelets 286 150 - 400 K/uL   nRBC 0.0 0.0 - 0.2 %     Procedures: EFM NST Baseline FHR: 135 beats/min Variability: moderate Accelerations: present Decelerations: absent Tocometry: no contractions per toco , or reported by the patient  Interpretation:  INDICATIONS: patient reassurance and rule out uterine contractions RESULTS:  A NST procedure was performed with FHR monitoring and a  normal baseline established, appropriate time of 20-40 minutes of evaluation, and accels >2 seen w 15x15 characteristics.  Results show a REACTIVE NST.    Hospital Course: The patient was admitted to Labor and Delivery Triage for observation. She was placed on the fetal monitor. A CBC was drawn, vital signs checked, and an IV placed  with LR infusion.Her FHTS were reactive and reassuring. She was normotensive, had normal VS, and her CBC result was WNL. A  headache was addressed with po magnesium and Reglan, and she improved after several hours. With her headache gone, and reassuring FHTS, and normal BP, she was discharged home with plans to take daily magneium tablets and follow up at her next OB appointment.  Discharge Condition: good  Disposition: Discharge disposition: 01-Home or Self Care       Diet: Regular diet  Discharge Activity: Activity as tolerated  Discharge Instructions     Discharge activity:  No Restrictions   Complete by: As directed    Discharge diet:  No restrictions   Complete by: As directed    No sexual activity restrictions   Complete by: As directed    Notify physician for a general feeling that "something is not right"   Complete by: As directed    Notify physician for increase or change in vaginal discharge   Complete by: As directed    Notify physician for intestinal cramps, with or without diarrhea, sometimes described as "gas pain"   Complete by: As directed    Notify physician for leaking of fluid   Complete by: As directed    Notify physician for low, dull backache, unrelieved by heat or Tylenol   Complete by: As directed    Notify physician for menstrual like cramps   Complete by: As directed  Notify physician for pelvic pressure   Complete by: As directed    Notify physician for uterine contractions.  These may be painless and feel like the uterus is tightening or the baby is  "balling up"   Complete by: As directed    Notify physician for  vaginal bleeding   Complete by: As directed    PRETERM LABOR:  Includes any of the follwing symptoms that occur between 20 - [redacted] weeks gestation.  If these symptoms are not stopped, preterm labor can result in preterm delivery, placing your baby at risk   Complete by: As directed       Allergies as of 05/28/2022       Reactions   Tylenol [acetaminophen] Other (See Comments)   fever   Depo-medrol [methylprednisolone] Nausea Only   Feeling flushed        Medication List     STOP taking these medications    metoCLOPramide 10 MG tablet Commonly known as: Reglan   promethazine 25 MG tablet Commonly known as: PHENERGAN       TAKE these medications    albuterol 108 (90 Base) MCG/ACT inhaler Commonly known as: VENTOLIN HFA Inhale 1-2 puffs into the lungs every 6 (six) hours as needed for wheezing or shortness of breath.   ondansetron 4 MG tablet Commonly known as: Zofran Take 1 tablet (4 mg total) by mouth every 8 (eight) hours as needed for nausea or vomiting.   Prenatal Vitamins 28-0.8 MG Tabs Take 1 tablet by mouth daily.         Total time spent taking care of this patient: 45 minutes  Signed: Imagene Riches, CNM  05/28/2022, 7:02 PM

## 2022-05-28 NOTE — OB Triage Note (Signed)
Pt presents c/o HA and dizziness since Monday that has progressively got worse. Pt denies, LOF or bleeding. Pt reports positive fetal movement. Pt reports occasional abdominal cramping. VSS. Will continue to monitor

## 2022-05-28 NOTE — Discharge Summary (Signed)
   Please see Final Progress Note  Imagene Riches, CNM  05/28/2022 7:00 PM

## 2022-05-28 NOTE — Telephone Encounter (Signed)
I called to speak with [name] about the genetic testing for GBA on her partner, Tamara Mcclain (dob 03/22/1980).  He was seen for a lab only visit on 05/07/22 in our clinic, but apparently left the lab prior to having blood drawn.  The patient stated there was a long wait and the lab indicated that they did not have the needed tube for the testing.  They therefore left.  The patient has a follow up visit in our clinic on 06/09/22 and would like for [place] to plan for him to have this testing at that time.  He would prefer to have a bucchal or saliva sample rather than blood if possible.  I have spoken with [facility] about the testing option and they stated that either saliva or blood is needed for this test code.  We will schedule Tamara Mcclain for a Lab Only visit on 06/09/22 for this testing to be completed.  The patient is aware it will take 2-3 weeks for results.  She is currently [redacted] weeks gestation but stated that she would not desire amniocentesis or alter her pregnancy regardless of Tamara Mcclain results.  We can be reached at [phone].  Tamara Finlay, MS, CGC

## 2022-05-30 LAB — URINE CULTURE

## 2022-05-30 IMAGING — DX DG CHEST 2V
2 series · 2 of 2 positions shown · non-contrast
Comparison: Radiographs 02/13/2018.

CLINICAL DATA: Shortness of breath with chest pain and cough since
last night.

EXAM:
CHEST - 2 VIEW

[chest pa]
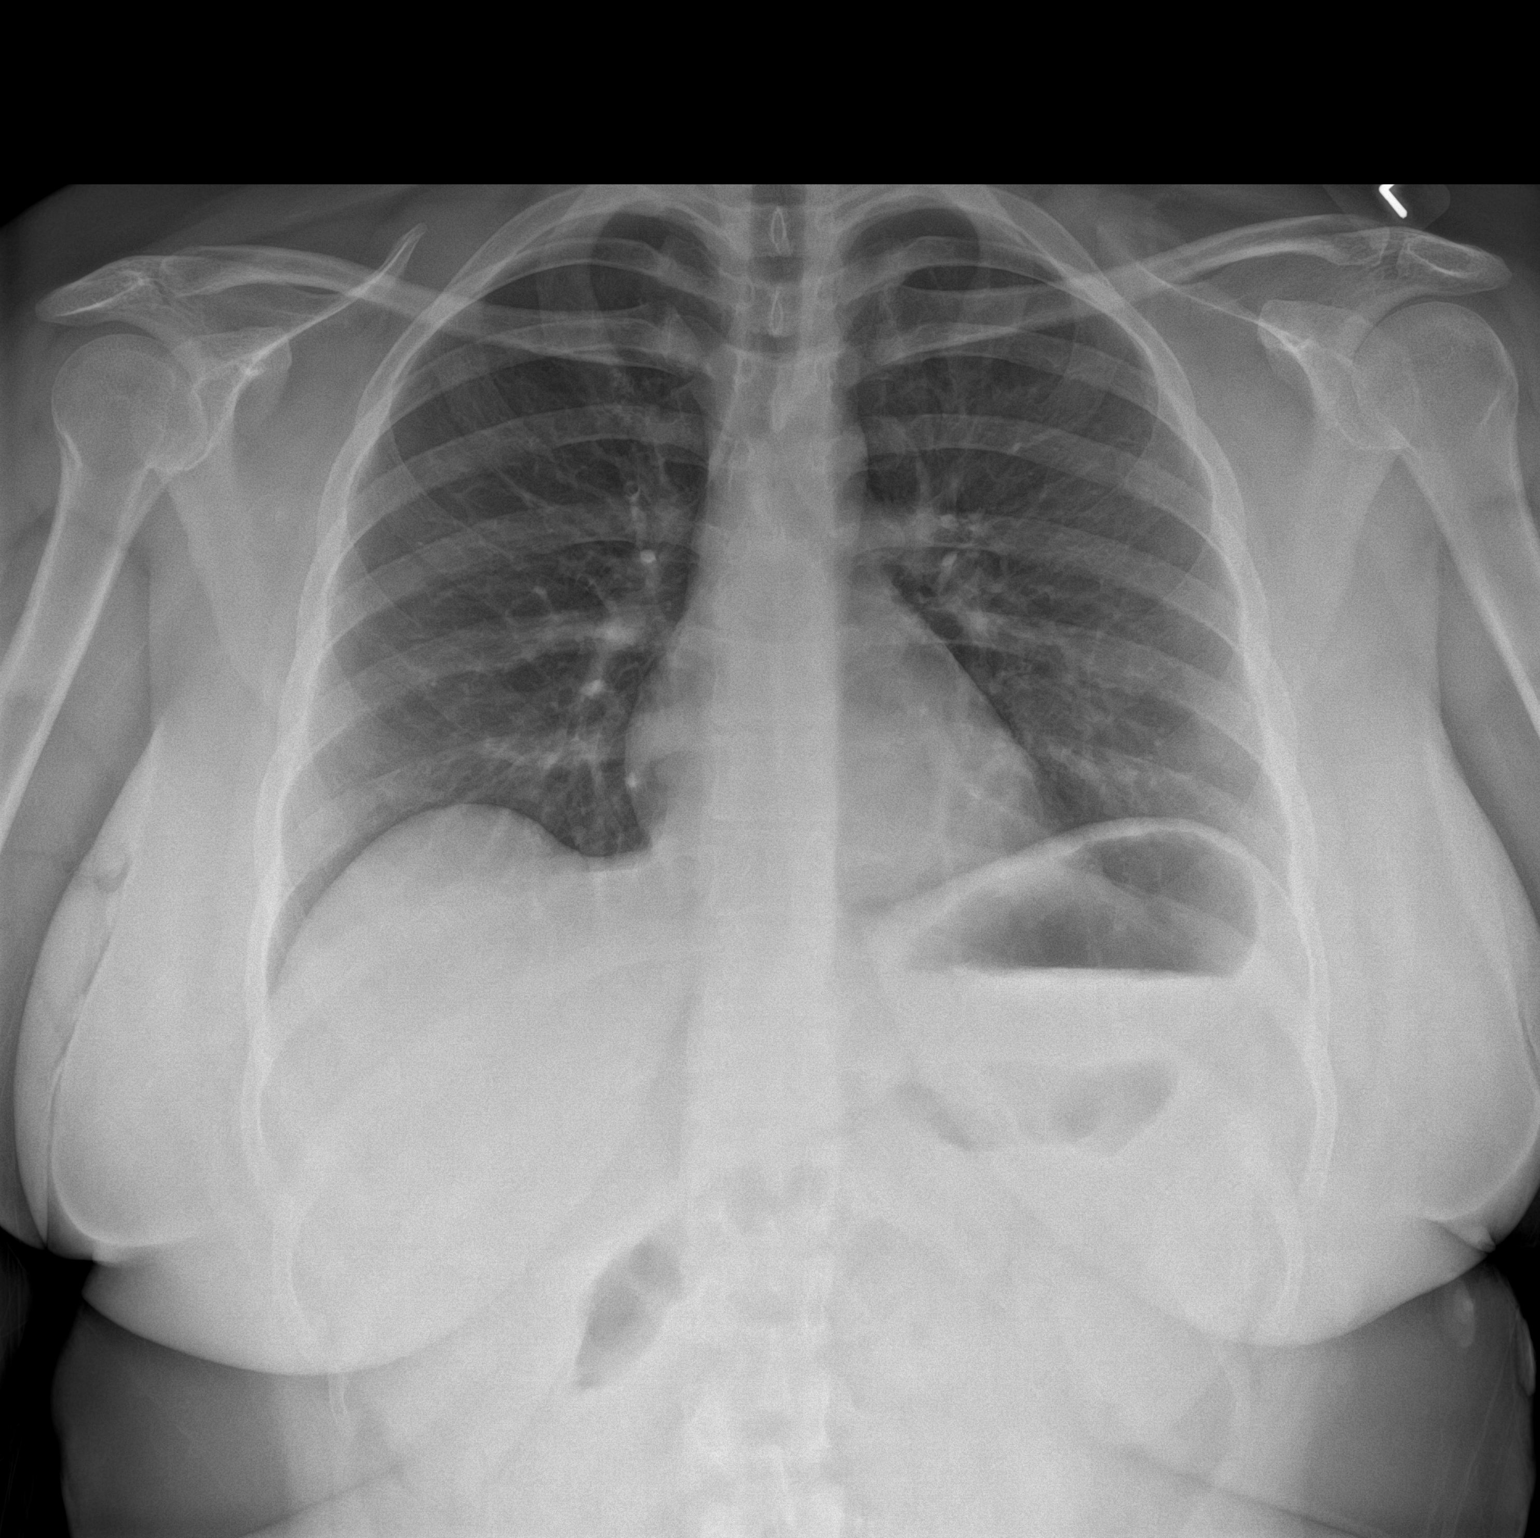

[chest lat]
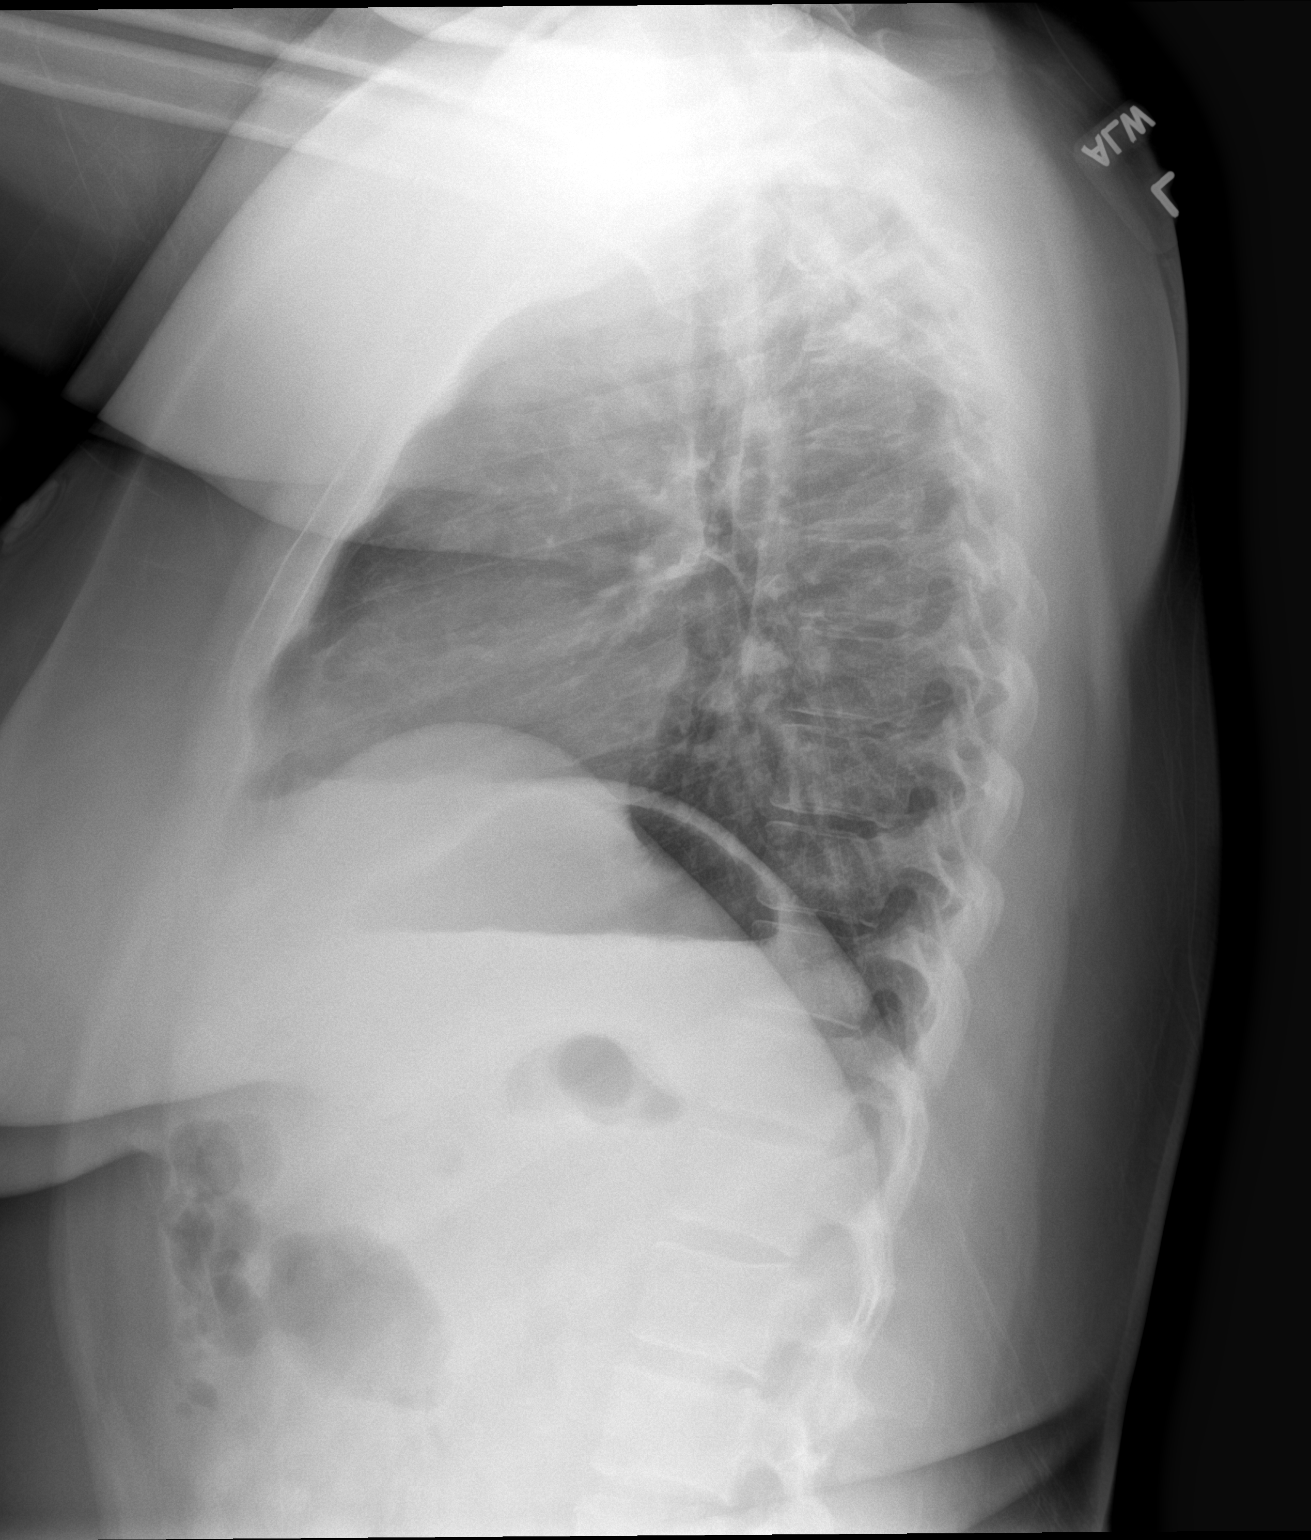

[2 of 2 positions shown; findings below may reference images not displayed]

FINDINGS: Suboptimal inspiration, specially on the lateral view. The heart
size and mediastinal contours are normal. The lungs are clear. There
is no pleural effusion or pneumothorax. No acute osseous findings
are identified.
IMPRESSION: Suboptimal inspiration. No evidence of active cardiopulmonary
process.

## 2022-06-04 ENCOUNTER — Other Ambulatory Visit: Payer: Self-pay

## 2022-06-04 DIAGNOSIS — O10919 Unspecified pre-existing hypertension complicating pregnancy, unspecified trimester: Secondary | ICD-10-CM

## 2022-06-04 DIAGNOSIS — Z8481 Family history of carrier of genetic disease: Secondary | ICD-10-CM

## 2022-06-04 DIAGNOSIS — O99213 Obesity complicating pregnancy, third trimester: Secondary | ICD-10-CM

## 2022-06-04 DIAGNOSIS — Z8349 Family history of other endocrine, nutritional and metabolic diseases: Secondary | ICD-10-CM

## 2022-06-06 NOTE — Progress Notes (Signed)
Routine Prenatal Care Visit  Subjective  Tamara Mcclain is a 22 y.o. G3P0020 at 50w6days being seen today for ongoing prenatal care.  She is currently monitored for the following issues for this low-risk pregnancy and has Epiploic appendagitis; Panic attacks; Class 2 obesity due to excess calories with body mass index (BMI) of 37.0 to 37.9 in adult; Asthma; Chronic hypertension during pregnancy, antepartum; Family history of carrier of genetic disease; Back pain; Acute right flank pain; Supervision of high risk pregnancy, antepartum; and [redacted] weeks gestation of pregnancy on their problem list.  ----------------------------------------------------------------------------------- Patient reports headache.  Has had a severe headache and dizziness for 3 days.  The headache started on Monday, it was worse last night.  It is in the front and center of her head, it is throbbing and feels like pressure. The pain comes and goes. She has tried ice and going to a dark room with no relief. She is allergic to Tylenol so has not taken any.  She has been dizzy since  yesterday, it feels like  the room is spinning , she did drive herself to the office  She has  barely eaten anything over the last few days, she has  had some peanut butter, toast and an egg sandwich She has had some lower abd discomfort, last BM was this morning. Denies diarrhea  She has had nausea since the beginning of pregnancy  She has been drinking 4-5 water bottles  Denies fevers and any sick contacts. She does not work outside of the home.   -pt ill appearing, skin pale green in color  Lungs CTAB heart RRR, no skips, murmurs or gallops   Contractions: Not present. Vag. Bleeding: None.  Movement: Present. Leaking Fluid denies.  ----------------------------------------------------------------------------------- The following portions of the patient's history were reviewed and updated as appropriate: allergies, current medications, past family  history, past medical history, past social history, past surgical history and problem list. Problem list updated.  Objective  Blood pressure 117/81, pulse 100, weight 181 lb 12.8 oz (82.5 kg), last menstrual period 11/13/2021. Pregravid weight 190 lb (86.2 kg) Total Weight Gain -8 lb 3.3 oz (-3.723 kg) Urinalysis: Urine Protein    Urine Glucose    Fetal Status:     Movement: Present     General:  Alert, oriented and cooperative. Patient is in no acute distress.  Skin: Skin is warm and dry. No rash noted.   Cardiovascular: Normal heart rate noted  Respiratory: Normal respiratory effort, no problems with respiration noted  Abdomen: Soft, gravid, appropriate for gestational age. Pain/Pressure: Present     Pelvic:  Cervical exam deferred        Extremities: Normal range of motion.  Edema: None  Mental Status: Normal mood and affect. Normal behavior. Normal judgment and thought content.   Assessment   22 y.o. G3P0020 at [redacted]w[redacted]d by  08/28/2022, by Ultrasound presenting for work-in prenatal visit  Plan   third Problems (from 12/31/21 to present)     No problems associated with this episode.        general obstetric precautions including but not limited to vaginal bleeding, contractions, leaking of fluid and fetal movement were reviewed in detail with the patient. Please refer to After Visit Summary for other counseling recommendations.   [facility] called, recommend pt go to triage for IV fluids and evaluation  Pt instructed to go directly to L and D triage, L and C and CNM on call notified FH and fetal ausculation not done  as pt is headed to [place], [facility]  06/06/22  4:37 PM

## 2022-06-08 ENCOUNTER — Other Ambulatory Visit: Payer: Self-pay

## 2022-06-08 DIAGNOSIS — O0992 Supervision of high risk pregnancy, unspecified, second trimester: Secondary | ICD-10-CM

## 2022-06-08 DIAGNOSIS — Z3A28 28 weeks gestation of pregnancy: Secondary | ICD-10-CM

## 2022-06-09 ENCOUNTER — Other Ambulatory Visit: Payer: BC Managed Care – PPO

## 2022-06-09 ENCOUNTER — Other Ambulatory Visit: Payer: Self-pay

## 2022-06-09 ENCOUNTER — Ambulatory Visit (INDEPENDENT_AMBULATORY_CARE_PROVIDER_SITE_OTHER): Payer: BC Managed Care – PPO | Admitting: Obstetrics & Gynecology

## 2022-06-09 ENCOUNTER — Ambulatory Visit: Payer: BC Managed Care – PPO | Attending: Obstetrics

## 2022-06-09 VITALS — BP 120/80 | Wt 181.0 lb

## 2022-06-09 DIAGNOSIS — O10913 Unspecified pre-existing hypertension complicating pregnancy, third trimester: Secondary | ICD-10-CM | POA: Diagnosis not present

## 2022-06-09 DIAGNOSIS — Z3A28 28 weeks gestation of pregnancy: Secondary | ICD-10-CM

## 2022-06-09 DIAGNOSIS — E669 Obesity, unspecified: Secondary | ICD-10-CM

## 2022-06-09 DIAGNOSIS — Z8481 Family history of carrier of genetic disease: Secondary | ICD-10-CM | POA: Insufficient documentation

## 2022-06-09 DIAGNOSIS — O99213 Obesity complicating pregnancy, third trimester: Secondary | ICD-10-CM

## 2022-06-09 DIAGNOSIS — O10919 Unspecified pre-existing hypertension complicating pregnancy, unspecified trimester: Secondary | ICD-10-CM

## 2022-06-09 DIAGNOSIS — O10013 Pre-existing essential hypertension complicating pregnancy, third trimester: Secondary | ICD-10-CM

## 2022-06-09 DIAGNOSIS — Z362 Encounter for other antenatal screening follow-up: Secondary | ICD-10-CM | POA: Insufficient documentation

## 2022-06-09 DIAGNOSIS — O99113 Other diseases of the blood and blood-forming organs and certain disorders involving the immune mechanism complicating pregnancy, third trimester: Secondary | ICD-10-CM

## 2022-06-09 DIAGNOSIS — Z8349 Family history of other endocrine, nutritional and metabolic diseases: Secondary | ICD-10-CM | POA: Insufficient documentation

## 2022-06-09 DIAGNOSIS — D6800 Von Willebrand disease, unspecified: Secondary | ICD-10-CM

## 2022-06-09 DIAGNOSIS — O0990 Supervision of high risk pregnancy, unspecified, unspecified trimester: Secondary | ICD-10-CM

## 2022-06-09 DIAGNOSIS — E6609 Other obesity due to excess calories: Secondary | ICD-10-CM

## 2022-06-09 NOTE — Progress Notes (Signed)
   PRENATAL VISIT NOTE  Subjective:  Tamara Mcclain is a 22 y.o. G3P0020 at [redacted]w[redacted]d being seen today for ongoing prenatal care.  She is currently monitored for the following issues for this high-risk pregnancy and has Epiploic appendagitis; Panic attacks; Class 2 obesity due to excess calories with body mass index (BMI) of 37.0 to 37.9 in adult; Asthma; Chronic hypertension during pregnancy, antepartum; Family history of carrier of genetic disease; Back pain; Acute right flank pain; Supervision of high risk pregnancy, antepartum; and [redacted] weeks gestation of pregnancy on their problem list.  Patient reports no complaints.  Contractions: Not present. Vag. Bleeding: None.  Movement: Present. Denies leaking of fluid.   The following portions of the patient's history were reviewed and updated as appropriate: allergies, current medications, past family history, past medical history, past social history, past surgical history and problem list.   Objective:   Vitals:   06/09/22 1511  BP: 120/80  Weight: 181 lb (82.1 kg)    Fetal Status:     Movement: Present     General:  Alert, oriented and cooperative. Patient is in no acute distress.  Skin: Skin is warm and dry. No rash noted.   Cardiovascular: Normal heart rate noted  Respiratory: Normal respiratory effort, no problems with respiration noted  Abdomen: Soft, gravid, appropriate for gestational age.  Pain/Pressure: Present     Pelvic: Cervical exam deferred        Extremities: Normal range of motion.     Mental Status: Normal mood and affect. Normal behavior. Normal judgment and thought content.   FH- 28 FHR- 140s Assessment and Plan:  Pregnancy: [id number] at [redacted]w[redacted]d 1. Chronic hypertension during pregnancy, antepartum - no meds, excellent BPs  2. Class 2 obesity due to excess calories with body mass index (BMI) of 37.0 to 37.9 in adult, unspecified whether serious comorbidity present - no weight gain, baby at 25% with MFM scan today, has  follow up scan in 4 weeks  3. Supervision of high risk pregnancy, antepartum   4. [redacted] weeks gestation of pregnancy - 28 weeks labs done today - she will get TDAP at next visit  5. Von Willebrand disease ([place])   6. 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 Please refer to After Visit Summary for other counseling recommendations.   Return in about 2 weeks (around 06/23/2022).  Future Appointments  Date Time Provider [place]  06/11/2022 10:45 AM Georgina Peer, RD [facility] None  07/21/2022  9:00 AM [facility] US1 [facility] [place]  08/06/2022  1:00 PM [facility] None  08/13/2022  1:45 PM Earlie Server, MD [facility] None    Emily Filbert, MD

## 2022-06-10 ENCOUNTER — Encounter: Payer: Self-pay | Admitting: Obstetrics & Gynecology

## 2022-06-10 LAB — 28 WEEK RH+PANEL
Basophils Absolute: 0 10*3/uL (ref 0.0–0.2)
Basos: 0 %
EOS (ABSOLUTE): 0 10*3/uL (ref 0.0–0.4)
Eos: 0 %
Gestational Diabetes Screen: 98 mg/dL (ref 70–139)
HIV Screen 4th Generation wRfx: NONREACTIVE
Hematocrit: 36.2 % (ref 34.0–46.6)
Hemoglobin: 11.8 g/dL (ref 11.1–15.9)
Immature Grans (Abs): 0.1 10*3/uL (ref 0.0–0.1)
Immature Granulocytes: 1 %
Lymphocytes Absolute: 1.5 10*3/uL (ref 0.7–3.1)
Lymphs: 11 %
MCH: 29.4 pg (ref 26.6–33.0)
MCHC: 32.6 g/dL (ref 31.5–35.7)
MCV: 90 fL (ref 79–97)
Monocytes Absolute: 0.5 10*3/uL (ref 0.1–0.9)
Monocytes: 4 %
Neutrophils Absolute: 11 10*3/uL — ABNORMAL HIGH (ref 1.4–7.0)
Neutrophils: 84 %
Platelets: 324 10*3/uL (ref 150–450)
RBC: 4.01 x10E6/uL (ref 3.77–5.28)
RDW: 13.4 % (ref 11.7–15.4)
RPR Ser Ql: NONREACTIVE
WBC: 13.1 10*3/uL — ABNORMAL HIGH (ref 3.4–10.8)

## 2022-06-10 NOTE — Addendum Note (Signed)
Addended by: Meryl Dare on: 06/10/2022 03:16 PM   Modules accepted: Orders

## 2022-06-11 ENCOUNTER — Encounter: Payer: BC Managed Care – PPO | Attending: Obstetrics | Admitting: Dietician

## 2022-06-11 ENCOUNTER — Encounter: Payer: Self-pay | Admitting: Dietician

## 2022-06-11 VITALS — Ht 60.0 in | Wt 182.0 lb

## 2022-06-11 DIAGNOSIS — O2612 Low weight gain in pregnancy, second trimester: Secondary | ICD-10-CM

## 2022-06-11 DIAGNOSIS — Z3A28 28 weeks gestation of pregnancy: Secondary | ICD-10-CM | POA: Diagnosis not present

## 2022-06-11 NOTE — Patient Instructions (Signed)
Plan to eat small meals and snacks every 3-4 hours during the day, about 6 times a day.  Include a protein food + a starch + if able a veg or fruit.  Avoid fried foods and foods with heavy cream or cheese sauces, they are harder to digest. Also avoid high fiber foods like some vegetables and fruits, breads with seeds, nuts.  Stay upright for a while after eating to help digestion. OK to try adding unflavored protein powder into foods such as pudding, jello, sauces, and drinks.

## 2022-06-11 NOTE — Progress Notes (Signed)
Medical Nutrition Therapy: Visit start time: 1100  end time: 1150  Assessment:   Referral Diagnosis: poor weight gain during pregnancy Other medical history/ diagnoses: hypertension Psychosocial issues/ stress concerns: none  Medications, supplements: reconciled list in medical record   Preferred learning method:  Auditory    Current weight: 182.0lbs with jacket and shoes Height: 5'0" BMI: 35.54    Progress and evaluation:  Patient states she has had nausea and some vomiting throughout pregnancy; has ondansetron but does not help control nausea. .   Each day is different with nausea and tolerance.  Reports picky eating even prior to pregnancy, mostly related to texture.  Lost about 15-17lbs during pregnancy, mostly during 1st trimester Food allergies: none Special diet practices: none Patient seeks help with adequate nutrition for pregnancy, managing nausea Next PCP appt is 06/23/22   Dietary Intake:  Usual eating pattern includes 2 meals and 1-2 snacks per day. Dining out frequency: 5-7 meals per week. Who plans meals/ buys groceries? self Who prepares meals? spouse  Breakfast: usually unable to eat; tries to consume something ie apple juice, vomited 2 hours later. Snack: pudding cup or jello; recently 2-3 raw baby carrots Lunch: PBJ uncrustable sometimes; cheese sandwich @ [employer] Snack: none or same as am Supper: pizza; mac and cheese; french fries, grilled cheese; spaghetti and meat sauce Snack: none Beverages: water, apple juice, occasional soda with pizza/ when out; gatorade  Physical activity: no regular exercise   Intervention:   Nutrition Care Education:   Basic nutrition: basic food groups; appropriate nutrient balance; appropriate meal and snack schedule; general nutrition guidelines    Weight control: identifying healthy weight gain for pregnancy with goal of 11-15lbs for pre-pregnancy obesity; choosing small portions Nausea: eating small and frequent  meals and snacks; avoiding high fat foods; using liquids/ supplemental protein as needed; allowing foods that satisfy craving (with only small portion if high in fat); limiting/ avoiding high fiber foods   Other intervention notes: Patient voices concern for her baby's health. She reports difficulty making many healthy choices due to nausea and history of selective eating pattern. Established goals for change with direction from patient.    Nutritional Diagnosis:  [place]-3.2 Unintentional weight loss As related to nausea and vomiting during pregnancy.  As evidenced by patient with reported weight loss of about 16lbs during first half of pregnancy.   Education Materials given:  Morning Sickness Nutrition Therapy (NCM) Plate Planner with food lists, sample meal pattern Snacking handout Visit summary with goals/ instructions   Learner/ who was taught:  Patient   Level of understanding: Verbalizes/ demonstrates competency   Demonstrated degree of understanding via:   Teach back Learning barriers: None  Willingness to learn/ readiness for change: Eager, change in progress  Monitoring and Evaluation:  Dietary intake, exercise, GI symptoms, and body weight      follow up:  07/20/22 at 11:15am

## 2022-06-22 ENCOUNTER — Encounter: Payer: Self-pay | Admitting: Obstetrics and Gynecology

## 2022-06-22 ENCOUNTER — Other Ambulatory Visit: Payer: Self-pay | Admitting: Obstetrics

## 2022-06-22 ENCOUNTER — Observation Stay
Admission: EM | Admit: 2022-06-22 | Discharge: 2022-06-22 | Disposition: A | Discharge status: Home / Self Care | Payer: BC Managed Care – PPO | Attending: Obstetrics and Gynecology | Admitting: Obstetrics and Gynecology

## 2022-06-22 ENCOUNTER — Observation Stay: Payer: BC Managed Care – PPO

## 2022-06-22 ENCOUNTER — Other Ambulatory Visit: Payer: Self-pay

## 2022-06-22 DIAGNOSIS — N2889 Other specified disorders of kidney and ureter: Secondary | ICD-10-CM

## 2022-06-22 DIAGNOSIS — Z3A30 30 weeks gestation of pregnancy: Secondary | ICD-10-CM | POA: Insufficient documentation

## 2022-06-22 DIAGNOSIS — O26899 Other specified pregnancy related conditions, unspecified trimester: Secondary | ICD-10-CM | POA: Diagnosis present

## 2022-06-22 DIAGNOSIS — O2693 Pregnancy related conditions, unspecified, third trimester: Secondary | ICD-10-CM | POA: Diagnosis present

## 2022-06-22 DIAGNOSIS — O26893 Other specified pregnancy related conditions, third trimester: Secondary | ICD-10-CM

## 2022-06-22 DIAGNOSIS — R109 Unspecified abdominal pain: Secondary | ICD-10-CM | POA: Insufficient documentation

## 2022-06-22 LAB — URINALYSIS, ROUTINE W REFLEX MICROSCOPIC
Bilirubin Urine: NEGATIVE
Glucose, UA: NEGATIVE mg/dL
Hgb urine dipstick: NEGATIVE
Ketones, ur: NEGATIVE mg/dL
Nitrite: NEGATIVE
Protein, ur: NEGATIVE mg/dL
Specific Gravity, Urine: 1.018 (ref 1.005–1.030)
pH: 6 (ref 5.0–8.0)

## 2022-06-22 LAB — CBC
HCT: 32.8 % — ABNORMAL LOW (ref 36.0–46.0)
Hemoglobin: 11.1 g/dL — ABNORMAL LOW (ref 12.0–15.0)
MCH: 28.7 pg (ref 26.0–34.0)
MCHC: 33.8 g/dL (ref 30.0–36.0)
MCV: 84.8 fL (ref 80.0–100.0)
Platelets: 281 10*3/uL (ref 150–400)
RBC: 3.87 MIL/uL (ref 3.87–5.11)
RDW: 13.3 % (ref 11.5–15.5)
WBC: 13.1 10*3/uL — ABNORMAL HIGH (ref 4.0–10.5)
nRBC: 0 % (ref 0.0–0.2)

## 2022-06-22 MED ORDER — OXYCODONE HCL 5 MG PO TABS: 10.0000 mg | ORAL_TABLET | Freq: Once | ORAL | Status: DC

## 2022-06-22 MED ORDER — NITROFURANTOIN MONOHYD MACRO 100 MG PO CAPS: 100.0000 mg | ORAL_CAPSULE | Freq: Two times a day (BID) | ORAL | 1 refills | Status: DC

## 2022-06-22 MED ORDER — MORPHINE SULFATE (PF) 2 MG/ML IV SOLN
2.0000 mg | INTRAVENOUS | Status: DC | PRN
  Administered 2022-06-22: 2 mg via INTRAVENOUS
  Filled 2022-06-22: qty 1

## 2022-06-22 MED ORDER — OXYCODONE HCL 5 MG PO TABS: 5.0000 mg | ORAL_TABLET | ORAL | 0 refills | Status: DC | PRN

## 2022-06-22 MED ORDER — LACTATED RINGERS IV SOLN: INTRAVENOUS | Status: DC

## 2022-06-22 MED ORDER — SODIUM CHLORIDE 0.9 % IV SOLN
1.0000 g | Freq: Two times a day (BID) | INTRAVENOUS | Status: DC
  Administered 2022-06-22: 1 g via INTRAVENOUS
  Filled 2022-06-22: qty 1
  Filled 2022-06-22: qty 10

## 2022-06-22 NOTE — Progress Notes (Signed)
Patient wheeled to ultrasound by transport

## 2022-06-22 NOTE — Discharge Summary (Signed)
Please see Final Progress note.  Imagene Riches, CNM  06/22/2022 12:39 PM

## 2022-06-22 NOTE — Final Progress Note (Signed)
Final Progress Note  Patient ID: Tamara Mcclain MRN: [id number] DOB/AGE: March 05, 2001 22 y.o.  Admit date: 06/22/2022 Admitting provider: Imagene Riches, CNM Discharge date: 06/22/2022   Admission Diagnoses: Discharge Diagnoses:  Principal Problem:   Flank pain in pregnant patient    History of Present Illness: The patient is a 22 y.o. female G45P0020 at 29w3dwho presents for evaluation of right sided flank pain that started this morning. She has been seen for this same complaint in the past, and a previous renal scan did not reveal any stones or hydronephrosis. Today she describes intermittent , stabbing pain, in her right  CVA area. She denies any contractions, vaginal bleeding or LOF. Her baby is moving well.  Past Medical History:  Diagnosis Date   Anxiety    Epiploic appendagitis    Kidney stones 04/13/2022   Right Flank   MTHFR mutation    [facility] [facility]   Past Surgical History:  Procedure Laterality Date   ESOPHAGOGASTRODUODENOSCOPY (EGD) WITH PROPOFOL N/A 01/30/2020   Procedure: ESOPHAGOGASTRODUODENOSCOPY (EGD) WITH PROPOFOL;  Surgeon: AJonathon Bellows MD;  [facility];  Service: Gastroenterology;  Laterality: N/A;   WISDOM TOOTH EXTRACTION  08/2018   no bleeding complications    No current facility-administered medications on file prior to encounter.   Current Outpatient Medications on File Prior to Encounter  Medication Sig Dispense Refill   albuterol (VENTOLIN HFA) 108 (90 Base) MCG/ACT inhaler Inhale 1-2 puffs into the lungs every 6 (six) hours as needed for wheezing or shortness of breath. 8 g 1   ondansetron (ZOFRAN) 4 MG tablet Take 1 tablet (4 mg total) by mouth every 8 (eight) hours as needed for nausea or vomiting. 20 tablet 1   Prenatal Vit-Fe Fumarate-FA (PRENATAL VITAMINS) 28-0.8 MG TABS Take 1 tablet by mouth daily.      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   Comment: [employer]  Tobacco Use   Smoking status: Never    Passive exposure: Past   Smokeless tobacco: Never  Vaping Use   Vaping Use: Never used  Substance and Sexual Activity   Alcohol use: No    Alcohol/week: 0.0 standard drinks of alcohol   Drug use: No   Sexual activity: Yes    Partners: Male    Birth control/protection: None  Other Topics Concern   Not on file  Social History Narrative   Not on file   Social Determinants of Health   Financial Resource Strain: Low Risk  (12/31/2021)   Overall Financial Resource Strain (CARDIA)    Difficulty of Paying Living Expenses: Not hard at all  Food Insecurity: Not on file  Transportation Needs: No Transportation Needs (12/31/2021)   PRAPARE - Transportation    Lack of Transportation (Medical): No    Lack of Transportation (Non-Medical): No  Physical Activity: Sufficiently Active (12/31/2021)   Exercise Vital Sign    Days of Exercise per Week: 4 days    Minutes of Exercise per Session: 150+ min  Stress: No Stress Concern Present (12/31/2021)  [employer] - Occupational Stress Questionnaire    Feeling of Stress : Not at all  Social Connections: Moderately Integrated (12/31/2021)   Social Connection and Isolation Panel [NHANES]    Frequency of Communication with Friends and Family: More than three times a week    Frequency of Social Gatherings with Friends and Family: Three times a week    Attends Religious Services: More than 4 times per year    Active Member of Clubs or Organizations: No    Attends Archivist Meetings: Never    Marital Status: Living with partner   Intimate Partner Violence: Not At Risk (12/31/2021)   Humiliation, Afraid, Rape, and Kick questionnaire    Fear of Current or Ex-Partner: No    Emotionally Abused: No    Physically Abused: No    Sexually Abused: No    Family History  Problem Relation Age of Onset   Hypertension Father    Pulmonary embolism Father    Cancer Maternal Grandmother 4       breast   Arthritis Maternal Grandmother    Hyperlipidemia Maternal Grandmother    CAD Maternal Grandfather    Cancer Paternal Grandmother        melanoma, sinus cancer   Colon polyps Paternal Grandmother    Heart disease Paternal Grandfather    Gaucher's disease Half-Brother    Esophageal cancer Neg Hx    Stomach cancer Neg Hx    Liver disease Neg Hx      ROS   Physical Exam: BP 127/78 (BP Location: Right Arm)   Pulse (!) 103   Temp 98.1 F (36.7 C) (Oral)   Resp 20   Ht 5' (1.524 m)   Wt 81.6 kg   LMP 11/13/2021 (Approximate)   BMI 35.15 kg/m   OBGyn Exam  Consults: None  Significant Findings/ Diagnostic Studies: labs:  Results for orders placed or performed during the hospital encounter of 06/22/22 (from the past 24 hour(s))  CBC     Status: Abnormal   Collection Time: 06/22/22 10:03 AM  Result Value Ref Range   WBC 13.1 (H) 4.0 - 10.5 K/uL   RBC 3.87 3.87 - 5.11 MIL/uL   Hemoglobin 11.1 (L) 12.0 - 15.0 g/dL   HCT 32.8 (L) 36.0 - 46.0 %   MCV 84.8 80.0 - 100.0 fL   MCH 28.7 26.0 - 34.0 pg   MCHC 33.8 30.0 - 36.0 g/dL   RDW 13.3 11.5 - 15.5 %   Platelets 281 150 - 400 K/uL   nRBC 0.0 0.0 - 0.2 %  Urinalysis, Routine w reflex microscopic -Urine, Clean Catch     Status: Abnormal   Collection Time: 06/22/22 10:03 AM  Result Value Ref Range   Color, Urine AMBER (A) YELLOW   APPearance HAZY (A) CLEAR   Specific Gravity, Urine 1.018 1.005 - 1.030   pH 6.0 5.0 - 8.0   Glucose, UA NEGATIVE NEGATIVE mg/dL   Hgb urine dipstick NEGATIVE NEGATIVE   Bilirubin Urine NEGATIVE NEGATIVE   Ketones, ur NEGATIVE  NEGATIVE mg/dL   Protein, ur NEGATIVE NEGATIVE mg/dL   Nitrite NEGATIVE NEGATIVE   Leukocytes,Ua MODERATE (A) NEGATIVE   RBC / HPF 0-5 0 - 5 RBC/hpf   WBC, UA 21-50 0 - 5 WBC/hpf   Bacteria, UA RARE (A) NONE SEEN   Squamous Epithelial / HPF 21-50 0 - 5 /HPF   Mucus PRESENT   .  Procedures: EFM NST Baseline FHR: 130 beats/min Variability: moderate  Accelerations: present Decelerations: absent Tocometry: no contraction activity noted  Interpretation:  INDICATIONS: rule out uterine contractions RESULTS:  A NST procedure was performed with FHR monitoring and a normal baseline established, appropriate time of 20-40 minutes of evaluation, and accels >2 seen w 15x15 characteristics.  Results show a REACTIVE NST.    Hospital Course: The patient was admitted to Labor and Delivery Triage for observation. A urine sample was sent ot the lab. She was placed on the fetal monitor  where a Category 1 strip was noted, even at this gestational age. She was given an IV bolus and , based on initial urinalysis results, was given one dose of IV Rocephin. A renal ultrasound identified mild right pelviectasis, for which she was given Morphine 2 mg IVP. Her pain subsided well with this medication. After consulting with Dr. Rubie Maid, and having observed a reassuring strip, and relief from her flank pain, she was discharged home with a plan to start on a course of Macrobid, use limited Roxicodone for pain, and a plan to follow up tomorrow at [facility] for her regular scheduled OB visit.  Discharge Condition: good  Disposition: Discharge disposition: 01-Home or Self Care       Diet: Regular diet  Discharge Activity: Activity as tolerated  Discharge Instructions     Discharge activity:  No Restrictions   Complete by: As directed    Notify physician for a general feeling that "something is not right"   Complete by: As directed    Notify physician for increase or change in vaginal discharge    Complete by: As directed    Notify physician for intestinal cramps, with or without diarrhea, sometimes described as "gas pain"   Complete by: As directed    Notify physician for leaking of fluid   Complete by: As directed    Notify physician for low, dull backache, unrelieved by heat or Tylenol   Complete by: As directed    Notify physician for menstrual like cramps   Complete by: As directed    Notify physician for pelvic pressure   Complete by: As directed    Notify physician for uterine contractions.  These may be painless and feel like the uterus is tightening or the baby is  "balling up"   Complete by: As directed    Notify physician for vaginal bleeding   Complete by: As directed    PRETERM LABOR:  Includes any of the follwing symptoms that occur between 20 - [redacted] weeks gestation.  If these symptoms are not stopped, preterm labor can result in preterm delivery, placing your baby at risk   Complete by: As directed       Allergies as of 06/22/2022       Reactions   Tylenol [acetaminophen] Other (See Comments)   fever   Depo-medrol [methylprednisolone] Nausea Only   Feeling flushed        Medication List     TAKE these medications    albuterol 108 (90 Base) MCG/ACT inhaler Commonly known as: VENTOLIN HFA Inhale 1-2 puffs into the lungs every 6 (six) hours as needed for wheezing or shortness of breath.   nitrofurantoin (macrocrystal-monohydrate) 100 MG capsule Commonly known as: MACROBID Take 1 capsule (100 mg total) by mouth 2 (two) times daily.   ondansetron 4 MG tablet Commonly known as: Zofran Take 1 tablet (4 mg total) by mouth every 8 (eight) hours as needed for nausea or vomiting.   oxyCODONE 5 MG immediate release tablet Commonly  known as: Roxicodone Take 1 tablet (5 mg total) by mouth every 4 (four) hours as needed for severe pain.   Prenatal Vitamins 28-0.8 MG Tabs Take 1 tablet by mouth daily.         Total time spent taking care of this  patient: 50 minutes  Signed: Imagene Riches, CNM  06/22/2022, 12:44 PM

## 2022-06-22 NOTE — OB Triage Note (Signed)
Discharge instructions, labor precautions, and follow-up care reviewed with patient. All questions answered. Patient verbalized understanding. Discharged ambulatory off unit.   

## 2022-06-22 NOTE — OB Triage Note (Signed)
Patient is a G3P0020 at 7w3dwho presents to unit c/o right sided flank pain since Saturday that has progressively gotten worse. Reports +FM, denies LOF, ctx, and vaginal bleeding. Last intercourse on Saturday. Denies urinary symptoms. Initial FHT 145. External monitors applied and assessing.

## 2022-06-23 ENCOUNTER — Telehealth: Payer: Self-pay | Admitting: Obstetrics and Gynecology

## 2022-06-23 ENCOUNTER — Ambulatory Visit (INDEPENDENT_AMBULATORY_CARE_PROVIDER_SITE_OTHER): Payer: BC Managed Care – PPO | Admitting: Obstetrics & Gynecology

## 2022-06-23 ENCOUNTER — Encounter: Payer: Self-pay | Admitting: Obstetrics & Gynecology

## 2022-06-23 VITALS — BP 120/80 | Wt 182.0 lb

## 2022-06-23 DIAGNOSIS — O0990 Supervision of high risk pregnancy, unspecified, unspecified trimester: Secondary | ICD-10-CM

## 2022-06-23 DIAGNOSIS — O99891 Other specified diseases and conditions complicating pregnancy: Secondary | ICD-10-CM

## 2022-06-23 DIAGNOSIS — E669 Obesity, unspecified: Secondary | ICD-10-CM

## 2022-06-23 DIAGNOSIS — Z3A30 30 weeks gestation of pregnancy: Secondary | ICD-10-CM

## 2022-06-23 DIAGNOSIS — O99210 Obesity complicating pregnancy, unspecified trimester: Secondary | ICD-10-CM | POA: Insufficient documentation

## 2022-06-23 DIAGNOSIS — O10013 Pre-existing essential hypertension complicating pregnancy, third trimester: Secondary | ICD-10-CM

## 2022-06-23 DIAGNOSIS — O10919 Unspecified pre-existing hypertension complicating pregnancy, unspecified trimester: Secondary | ICD-10-CM

## 2022-06-23 DIAGNOSIS — Z3A13 13 weeks gestation of pregnancy: Secondary | ICD-10-CM

## 2022-06-23 DIAGNOSIS — O99113 Other diseases of the blood and blood-forming organs and certain disorders involving the immune mechanism complicating pregnancy, third trimester: Secondary | ICD-10-CM

## 2022-06-23 DIAGNOSIS — Z148 Genetic carrier of other disease: Secondary | ICD-10-CM

## 2022-06-23 DIAGNOSIS — N2889 Other specified disorders of kidney and ureter: Secondary | ICD-10-CM | POA: Insufficient documentation

## 2022-06-23 DIAGNOSIS — Z23 Encounter for immunization: Secondary | ICD-10-CM

## 2022-06-23 DIAGNOSIS — D6800 Von Willebrand disease, unspecified: Secondary | ICD-10-CM

## 2022-06-23 LAB — POCT URINALYSIS DIPSTICK OB
Bilirubin, UA: NEGATIVE
Blood, UA: NEGATIVE
Glucose, UA: NEGATIVE
Ketones, UA: NEGATIVE
Leukocytes, UA: NEGATIVE
Nitrite, UA: NEGATIVE
Spec Grav, UA: 1.015 (ref 1.010–1.025)
Urobilinogen, UA: 0.2 E.U./dL
pH, UA: 6 (ref 5.0–8.0)

## 2022-06-23 NOTE — Telephone Encounter (Signed)
PC to Ms. Halleran to convey results of carrier screening for her partner, Janalee Dane (dob 03/22/1980).  His genetic testing for the GBA gene was negative for variants, deletions or duplications in this gene (see lab report in EPIC).  This indicates that he is not a carrier for Gaucher disease.  Therefore, this pregnancy is not expected to be at risk for this condition.  The baby has a 50% chance to be a carrier because Joellen Jersey is a carrier.  No additional testing is recommended at this time.  We can be reached at [phone] with any questions or concerns.  Wilburt Finlay, MS, CGC

## 2022-06-23 NOTE — Progress Notes (Signed)
   PRENATAL VISIT NOTE  Subjective:  SHAHED Mcclain is a 22 y.o. married G3P0020 (Tamara Mcclain) at [redacted]w[redacted]d being seen today for ongoing prenatal care.  She is currently monitored for the following issues for this low-risk pregnancy and has Von Willebrand disease ([place]); Epiploic appendagitis; Panic attacks; Class 2 obesity due to excess calories with body mass index (BMI) of 37.0 to 37.9 in adult; Asthma; Chronic hypertension during pregnancy, antepartum; Family history of carrier of genetic disease; Back pain; Acute right flank pain; Supervision of high risk pregnancy, antepartum; [redacted] weeks gestation of pregnancy; Flank pain in pregnant patient; Obesity in pregnancy; Genetic carrier of other disease; and Pelviectasis of kidney on their problem list.  Patient reports  that her right flank pain is unchanged . She has not yet picked up her ABX to treat her UTI (diagnosed via culture at triage yesterday.   Contractions: Irritability. Vag. Bleeding: None.  Movement: Present. Denies leaking of fluid.   The following portions of the patient's history were reviewed and updated as appropriate: allergies, current medications, past family history, past medical history, past social history, past surgical history and problem list.   Objective:   Vitals:   06/23/22 1429  BP: 120/80  Weight: 182 lb (82.6 kg)    Fetal Status:     Movement: Present     General:  Alert, oriented and cooperative. Patient is in no acute distress.  Skin: Skin is warm and dry. No rash noted.   Cardiovascular: Normal heart rate noted  Respiratory: Normal respiratory effort, no problems with respiration noted  Abdomen: Soft, gravid, appropriate for gestational age.  Pain/Pressure: Present     Pelvic: Cervical exam deferred        Extremities: Normal range of motion.     Mental Status: Normal mood and affect. Normal behavior. Normal judgment and thought content.   FH- 30 cm FHR- 140s  Assessment and Plan:  Pregnancy: [id number] at  [redacted]w[redacted]d 1. Chronic hypertension during pregnancy, antepartum - no meds, excellent BPs and normal growth of baby - will need IOL at 39 weeks if not sooner - follow up MFM scan scheduled  2. Supervision of high risk pregnancy, antepartum  - POC Urinalysis Dipstick OB  3. [redacted] weeks gestation of pregnancy - TDAP done today  4. Obesity in pregnancy - minus 8 pounds in the pregnancy, normal 1 hour glucola  5. Pelviectasis of kidney - I have encouraged her to pick up her ABX today and drink lots of water  6. Von Willebrand disease ([place])   7. 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 Please refer to After Visit Summary for other counseling recommendations.   Return in about 2 weeks (around 07/07/2022).  Future Appointments  Date Time Provider [place]  07/20/2022 11:15 AM Georgina Peer, RD [facility] None  07/21/2022  9:00 AM [facility] US1 [facility] [place]  08/06/2022  1:00 PM [facility] None  08/13/2022  1:45 PM Earlie Server, MD [facility] None    Emily Filbert, MD

## 2022-06-24 LAB — URINE CULTURE: Culture: 20000 — AB

## 2022-07-07 ENCOUNTER — Ambulatory Visit: Payer: BC Managed Care – PPO

## 2022-07-07 ENCOUNTER — Other Ambulatory Visit: Payer: Self-pay

## 2022-07-07 ENCOUNTER — Ambulatory Visit (INDEPENDENT_AMBULATORY_CARE_PROVIDER_SITE_OTHER): Payer: BC Managed Care – PPO | Admitting: Obstetrics & Gynecology

## 2022-07-07 ENCOUNTER — Observation Stay
Admission: EM | Admit: 2022-07-07 | Discharge: 2022-07-07 | Disposition: A | Discharge status: Home / Self Care | Payer: BC Managed Care – PPO | Attending: Licensed Practical Nurse | Admitting: Licensed Practical Nurse

## 2022-07-07 ENCOUNTER — Encounter: Payer: Self-pay | Admitting: Obstetrics and Gynecology

## 2022-07-07 VITALS — BP 120/80 | Ht 60.0 in | Wt 180.0 lb

## 2022-07-07 VITALS — BP 120/80 | Wt 180.0 lb

## 2022-07-07 DIAGNOSIS — O10013 Pre-existing essential hypertension complicating pregnancy, third trimester: Secondary | ICD-10-CM | POA: Diagnosis not present

## 2022-07-07 DIAGNOSIS — Z148 Genetic carrier of other disease: Secondary | ICD-10-CM | POA: Diagnosis not present

## 2022-07-07 DIAGNOSIS — Z3A32 32 weeks gestation of pregnancy: Secondary | ICD-10-CM | POA: Diagnosis not present

## 2022-07-07 DIAGNOSIS — O10919 Unspecified pre-existing hypertension complicating pregnancy, unspecified trimester: Secondary | ICD-10-CM

## 2022-07-07 DIAGNOSIS — E669 Obesity, unspecified: Secondary | ICD-10-CM | POA: Diagnosis not present

## 2022-07-07 DIAGNOSIS — R109 Unspecified abdominal pain: Secondary | ICD-10-CM | POA: Insufficient documentation

## 2022-07-07 DIAGNOSIS — O0990 Supervision of high risk pregnancy, unspecified, unspecified trimester: Secondary | ICD-10-CM

## 2022-07-07 DIAGNOSIS — O99891 Other specified diseases and conditions complicating pregnancy: Secondary | ICD-10-CM

## 2022-07-07 DIAGNOSIS — Z79899 Other long term (current) drug therapy: Secondary | ICD-10-CM | POA: Diagnosis not present

## 2022-07-07 DIAGNOSIS — O26893 Other specified pregnancy related conditions, third trimester: Secondary | ICD-10-CM | POA: Insufficient documentation

## 2022-07-07 DIAGNOSIS — O99210 Obesity complicating pregnancy, unspecified trimester: Secondary | ICD-10-CM

## 2022-07-07 DIAGNOSIS — O99213 Obesity complicating pregnancy, third trimester: Secondary | ICD-10-CM | POA: Diagnosis not present

## 2022-07-07 DIAGNOSIS — O4703 False labor before 37 completed weeks of gestation, third trimester: Principal | ICD-10-CM | POA: Insufficient documentation

## 2022-07-07 DIAGNOSIS — O26899 Other specified pregnancy related conditions, unspecified trimester: Secondary | ICD-10-CM | POA: Diagnosis present

## 2022-07-07 DIAGNOSIS — O4700 False labor before 37 completed weeks of gestation, unspecified trimester: Secondary | ICD-10-CM | POA: Insufficient documentation

## 2022-07-07 LAB — WET PREP, GENITAL
Clue Cells Wet Prep HPF POC: NONE SEEN
Sperm: NONE SEEN
Trich, Wet Prep: NONE SEEN
WBC, Wet Prep HPF POC: <10 (ref <10)
Yeast Wet Prep HPF POC: NONE SEEN

## 2022-07-07 LAB — URINALYSIS, COMPLETE (UACMP) WITH MICROSCOPIC
Bilirubin Urine: NEGATIVE
Glucose, UA: NEGATIVE mg/dL
Hgb urine dipstick: NEGATIVE
Ketones, ur: NEGATIVE mg/dL
Nitrite: NEGATIVE
Protein, ur: 30 mg/dL — AB
Specific Gravity, Urine: 1.021 (ref 1.005–1.030)
pH: 6 (ref 5.0–8.0)

## 2022-07-07 LAB — CHLAMYDIA/NGC RT PCR (ARMC ONLY)
Chlamydia Tr: NOT DETECTED
N gonorrhoeae: NOT DETECTED

## 2022-07-07 MED ORDER — DIPHENHYDRAMINE HCL 50 MG/ML IJ SOLN
25.0000 mg | Freq: Once | INTRAMUSCULAR | Status: AC
  Administered 2022-07-07: 25 mg via INTRAVENOUS
  Filled 2022-07-07: qty 1

## 2022-07-07 MED ORDER — LACTATED RINGERS IV BOLUS
500.0000 mL | Freq: Once | INTRAVENOUS | Status: AC
  Administered 2022-07-07: 500 mL via INTRAVENOUS

## 2022-07-07 NOTE — Discharge Summary (Addendum)
Physician Final Progress Note  Patient ID: Tamara Mcclain MRN: [id number] DOB/AGE: 23/13/2002 23 y.o.  Admit date: 07/07/2022 Admitting provider: Rubie Maid, MD Discharge date: 07/07/2022   Admission Diagnoses:  1) intrauterine pregnancy at [redacted]w[redacted]d 2) uterine contractions/cramping   Discharge Diagnoses:  Principal Problem:   Cramping affecting pregnancy, antepartum  Not in labor   History of Present Illness: The patient is a 22y.o. female G3P0020 at 323w4dho presents for contractions. Tamara Jerseyas seen for her ROB, while there she reported feeling a sharp pain over the top of her abdomen, She was placed on the monitor and contractions were present, her VE was closed, she was then sent to triage for monitoring. On arrival she reported the pain started this morning, it comes every 15-20 mins, lasts about 20 seconds and is sharp. She endorses+FM. She last a BM a few days ago. She last had IC over a week ago. Denies any changes to her discharge or LOF.   Past Medical History:  Diagnosis Date   Anxiety    Epiploic appendagitis    Kidney stones 04/13/2022   Right Flank   MTHFR mutation    Von Willebrand disease ([facility]  Past Surgical History:  Procedure Laterality Date   ESOPHAGOGASTRODUODENOSCOPY (EGD) WITH PROPOFOL N/A 01/30/2020   Procedure: ESOPHAGOGASTRODUODENOSCOPY (EGD) WITH PROPOFOL;  Surgeon: AnJonathon BellowsMD;  [facility];  Service: Gastroenterology;  Laterality: N/A;   WISDOM TOOTH EXTRACTION  08/2018   no bleeding complications    No current facility-administered medications on file prior to encounter.   Current Outpatient Medications on File Prior to Encounter  Medication Sig Dispense Refill   Prenatal Vit-Fe Fumarate-FA (PRENATAL VITAMINS) 28-0.8 MG TABS Take 1 tablet by mouth daily.     albuterol (VENTOLIN HFA) 108 (90 Base) MCG/ACT inhaler Inhale 1-2 puffs into the lungs every 6 (six) hours as needed for wheezing or shortness of  breath. 8 g 1   nitrofurantoin, macrocrystal-monohydrate, (MACROBID) 100 MG capsule Take 1 capsule (100 mg total) by mouth 2 (two) times daily. (Patient not taking: Reported on 06/23/2022) 14 capsule 1   ondansetron (ZOFRAN) 4 MG tablet Take 1 tablet (4 mg total) by mouth every 8 (eight) hours as needed for nausea or vomiting. 20 tablet 1   oxyCODONE (ROXICODONE) 5 MG immediate release tablet Take 1 tablet (5 mg total) by mouth every 4 (four) hours as needed for severe pain. 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  Comment: [employer]  Tobacco Use   Smoking status: Never    Passive exposure: Past   Smokeless tobacco: Never  Vaping Use   Vaping Use: Never used  Substance and Sexual Activity   Alcohol use: No    Alcohol/week: 0.0 standard drinks of alcohol   Drug use: No   Sexual activity: Yes    Partners: Male    Birth control/protection: None  Other Topics Concern   Not on file  Social History Narrative   Not  on file   Social Determinants of Health   Financial Resource Strain: Low Risk  (12/31/2021)   Overall Financial Resource Strain (CARDIA)    Difficulty of Paying Living Expenses: Not hard at all  Food Insecurity: Not on file  Transportation Needs: No Transportation Needs (12/31/2021)   PRAPARE - Hydrologist (Medical): No    Lack of Transportation (Non-Medical): No  Physical Activity: Sufficiently Active (12/31/2021)   Exercise Vital Sign    Days of Exercise per Week: 4 days    Minutes of Exercise per Session: 150+  min  Stress: No Stress Concern Present (12/31/2021)   [place]    Feeling of Stress : Not at all  Social Connections: Moderately Integrated (12/31/2021)   Social Connection and Isolation Panel [NHANES]    Frequency of Communication with Friends and Family: More than three times a week    Frequency of Social Gatherings with Friends and Family: Three times a week    Attends Religious Services: More than 4 times per year    Active Member of Clubs or Organizations: No    Attends Archivist Meetings: Never    Marital Status: Living with partner  Intimate Partner Violence: Not At Risk (12/31/2021)   Humiliation, Afraid, Rape, and Kick questionnaire    Fear of Current or Ex-Partner: No    Emotionally Abused: No    Physically Abused: No    Sexually Abused: No    Family History  Problem Relation Age of Onset   Hypertension Father    Pulmonary embolism Father    Cancer Maternal Grandmother 76       breast   Arthritis Maternal Grandmother    Hyperlipidemia Maternal Grandmother    CAD Maternal Grandfather    Cancer Paternal Grandmother        melanoma, sinus cancer   Colon polyps Paternal Grandmother    Heart disease Paternal Grandfather    Gaucher's disease Half-Brother    Esophageal cancer Neg Hx    Stomach cancer Neg Hx    Liver disease Neg Hx      Review of Systems  Constitutional: Negative.   Respiratory: Negative.    Cardiovascular: Negative.   Gastrointestinal:  Positive for abdominal pain.  Genitourinary: Negative.   Musculoskeletal: Negative.   Neurological: Negative.   Psychiatric/Behavioral: Negative.       Physical Exam: BP 131/80 (BP Location: Left Arm)   Pulse 78   Temp 98.7 F (37.1 C) (Oral)   Resp 18   Ht 5' (1.524 m)   Wt 81.6 kg   LMP 11/13/2021 (Approximate)   BMI 35.15 kg/m   Physical Exam Constitutional:      Appearance: Normal appearance.  Genitourinary:     Vulva  normal.  Pulmonary:     Effort: Pulmonary effort is normal.  Abdominal:     Tenderness: There is no abdominal tenderness.     Comments: gravid  Musculoskeletal:     Cervical back: Normal range of motion.     Right lower leg: No edema.     Left lower leg: No edema.  Neurological:     Mental Status: She is alert.  Skin:    General: Skin is warm.  Psychiatric:        Mood and Affect: Mood normal.   EFM: baseline 130, moderate variability, pos accel neg decel TOCO: rare contraction, some irritably   Consults: None  Significant Findings/ Diagnostic Studies: labs:  wet prep and gc/ct negative, UA with trace leuks and rare bacteria, ketones negative, protein positive.   Procedures: RNST   Hospital Course: The patient was admitted to Labor and Delivery Triage for observation. She had a RNST. Her cervix remained unchanged (cl/th/high) She attempted to PO hydrate but was unable to drink adequately so was then given a 1 liter LR bolus. She was given '25mg'$  benadryl IV.  About an hour after the IVF and Benadryl, she reported not feeling that pain anymore. Comfort measures reviewed including pregnancy support belt and wild yam root tincture.  Pt discharged home.   Discharge Condition: good  Disposition: Discharge disposition: 01-Home or Self Care       Diet: Regular diet  Discharge Activity: Activity as tolerated   Allergies as of 07/07/2022       Reactions   Tylenol [acetaminophen] Other (See Comments)   fever   Depo-medrol [methylprednisolone] Nausea Only   Feeling flushed        Medication List     STOP taking these medications    nitrofurantoin (macrocrystal-monohydrate) 100 MG capsule Commonly known as: MACROBID   oxyCODONE 5 MG immediate release tablet Commonly known as: Roxicodone       TAKE these medications    albuterol 108 (90 Base) MCG/ACT inhaler Commonly known as: VENTOLIN HFA Inhale 1-2 puffs into the lungs every 6 (six) hours as needed for wheezing  or shortness of breath.   ondansetron 4 MG tablet Commonly known as: Zofran Take 1 tablet (4 mg total) by mouth every 8 (eight) hours as needed for nausea or vomiting.   Prenatal Vitamins 28-0.8 MG Tabs Take 1 tablet by mouth daily.         Total time spent taking care of this patient: 30 minutes  Signed: Jillene Bucks [facility], CNM  07/07/2022, 8:11 PM

## 2022-07-07 NOTE — OB Triage Note (Signed)
Patient is a 22 yo, G3P0, at 32 weeks 4 days. Patient presents with complaints of cramping that starter earlier this morning. She was sent over from AOB office after her [place] visit today. Patient denies any vaginal bleeding or LOF. Patient reports pain in her upper abdomen described as tightening rated 3/10 on the pain scale. Patient reports +FM. Monitors applied and assessing. VSS. Initial fetal heart tone 135. Dominic CNM notified of patients arrival to unit. Plan to place in observation for wet prep, UA, and NST.

## 2022-07-07 NOTE — Progress Notes (Signed)
   PRENATAL VISIT NOTE  Subjective:  Tamara Mcclain is a 22 y.o. G3P0020 at 61w4dbeing seen today for ongoing prenatal care.  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  Patient reports  that she doesn't feel well, nothing specific. She has been having "tightening" around her abdomen since this morning. She denies ROM or bleeding. She reports good fetal movement .  Contractions: Irregular. Vag. Bleeding: None.  Movement: Present. Denies leaking of fluid.   The following portions of the patient's history were reviewed and updated as appropriate: allergies, current medications, past family history, past medical history, past social history, past surgical history and problem list.   Objective:   Vitals:   07/07/22 1445  BP: 120/80  Weight: 180 lb (81.6 kg)    Fetal Status:     Movement: Present     General:  Alert, oriented and cooperative. Patient is in no acute distress.  Skin: Skin is warm and dry. No rash noted.   Cardiovascular: Normal heart rate noted  Respiratory: Normal respiratory effort, no problems with respiration noted  Abdomen: Soft, gravid, appropriate for gestational age.  Pain/Pressure: Present     Pelvic: Cervical exam performed in the presence of a chaperone        Extremities: Normal range of motion.     Mental Status: Normal mood and affect. Normal behavior. Normal judgment and thought content.   Assessment and  Plan:  Pregnancy: [id number] 359w4d. Obesity in pregnancy   2. Chronic hypertension during pregnancy, antepartum - excellent BP, no meds except for baby asa  3. Supervision of high risk pregnancy, antepartum  4. 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 [redacted] weeks gestation of pregnancy Since she is contracting on NST (otherwise reactive with baseline of 140s), I will send her to hospital for UA and continued monitoring.  Preterm labor symptoms and general obstetric precautions including but not limited to vaginal bleeding, contractions, leaking of fluid and fetal movement were reviewed in detail with the patient. Please refer to After Visit Summary for other counseling recommendations.   Return in about 2 weeks (around 07/21/2022).  Future Appointments  Date Time Provider De[place]3/03/2023  9:00 AM [facility] US1 [facility]/03/2023 11:15 AM InGeorgina PeerRD [facility] None  08/06/2022  1:00 PM [facility] None  08/13/2022  1:45 PM YuEarlie ServerMD [facility] None    MyEmily FilbertMD

## 2022-07-07 NOTE — Progress Notes (Signed)
    NURSE VISIT NOTE  Subjective:    Patient ID: Tamara Mcclain, female    DOB: 30-Apr-2001, 22 y.o.   MRN: [id number]  HPI  Patient is a 22 y.o. G56P0020 female who presents for fetal monitoring per order from Clovia Cuff, MD.   Objective:    BP 120/80   Ht 5' (1.524 m)   Wt 180 lb (81.6 kg)   LMP 11/13/2021 (Approximate)   BMI 35.15 kg/m  Estimated Date of Delivery: 08/28/22  Assessment:   1. Supervision of high risk pregnancy, antepartum   2. Chronic hypertension during pregnancy, antepartum   3. Obesity in pregnancy   4. Premature uterine contractions      Plan:   Results reviewed and discussed with patient by  Clovia Cuff, MD.     Otila Kluver, LPN

## 2022-07-09 ENCOUNTER — Encounter: Payer: Self-pay | Admitting: Obstetrics & Gynecology

## 2022-07-09 LAB — URINE CULTURE: Culture: <10000 — AB

## 2022-07-15 ENCOUNTER — Encounter: Payer: Self-pay | Admitting: Licensed Practical Nurse

## 2022-07-15 ENCOUNTER — Ambulatory Visit (INDEPENDENT_AMBULATORY_CARE_PROVIDER_SITE_OTHER): Payer: BC Managed Care – PPO | Admitting: Licensed Practical Nurse

## 2022-07-15 VITALS — BP 111/70 | HR 98 | Wt 180.3 lb

## 2022-07-15 DIAGNOSIS — O0993 Supervision of high risk pregnancy, unspecified, third trimester: Secondary | ICD-10-CM

## 2022-07-15 DIAGNOSIS — O0990 Supervision of high risk pregnancy, unspecified, unspecified trimester: Secondary | ICD-10-CM

## 2022-07-15 DIAGNOSIS — Z3A33 33 weeks gestation of pregnancy: Secondary | ICD-10-CM

## 2022-07-15 LAB — POCT URINALYSIS DIPSTICK
Bilirubin, UA: NEGATIVE
Blood, UA: NEGATIVE
Glucose, UA: NEGATIVE
Ketones, UA: POSITIVE
Leukocytes, UA: NEGATIVE
Nitrite, UA: NEGATIVE
Protein, UA: POSITIVE — AB
Spec Grav, UA: 1.02 (ref 1.010–1.025)
Urobilinogen, UA: 1 E.U./dL
pH, UA: 7 (ref 5.0–8.0)

## 2022-07-15 NOTE — Progress Notes (Signed)
Routine Prenatal Care Visit  Subjective  Tamara Mcclain is a 22 y.o. G3P0020 at 31w5dbeing seen today for ongoing prenatal care.  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  ----------------------------------------------------------------------------------- Patient reports no complaints.  Doing well. Was seen in triage for cramping, has not had any since. May not have  a baby shower, they are not really her thing. Is starting to gather her items.   -Her husband had a medical emergency this week, high  blood pressure but was not an MI-coping with this, (he is 47and has family hx of MI at young age.) -Mood has been so-so.  -Pt seems in good spirits and well today  Contractions: Not present. Vag. Bleeding: None.  Movement: Present. Leaking Fluid denies.  ----------------------------------------------------------------------------------- The following portions of the patient's history were reviewed and updated as appropriate: allergies, current medications, past family history, past medical history, past social history, past surgical history and problem list. Problem list updated.  Objective  Blood pressure 111/70, pulse 98, weight 180 lb 4.8 oz (81.8 kg), last menstrual period 11/13/2021. Pregravid weight 190 lb (86.2 kg) Total Weight Gain -9 lb 11.2 oz (-4.4  kg) Urinalysis: Urine Protein    Urine Glucose    Fetal Status: Fetal Heart Rate (bpm): 145 Fundal Height: 33 cm Movement: Present     General:  Alert, oriented and cooperative. Patient is in no acute distress.  Skin: Skin is warm and dry. No rash noted.   Cardiovascular: Normal heart rate noted  Respiratory: Normal respiratory effort, no problems with respiration noted  Abdomen: Soft, gravid, appropriate for gestational age. Pain/Pressure: Absent     Pelvic:  Cervical exam deferred        Extremities: Normal range of motion.  Edema: None  Mental Status: Normal mood and affect. Normal behavior. Normal judgment and thought content.   Assessment   22y.o. G3P0020 at 386w5dy  08/28/2022, by Ultrasound presenting for routine prenatal visit  Plan   third Problems (from 12/31/21 to present)     No problems associated with this episode.        Preterm labor symptoms and general obstetric precautions including but not limited to vaginal bleeding, contractions, leaking of fluid and fetal movement were reviewed in detail with the patient. Please refer to After Visit Summary for other counseling recommendations.   Return in about 2 weeks (around 07/29/2022) for ROB. Fu USKoreacheduled  Will need weekly NST's for [facility]Gal[facility]CN[facility]  07/15/22  4:02 PM '

## 2022-07-17 ENCOUNTER — Other Ambulatory Visit: Payer: Self-pay

## 2022-07-17 DIAGNOSIS — Z8349 Family history of other endocrine, nutritional and metabolic diseases: Secondary | ICD-10-CM

## 2022-07-17 DIAGNOSIS — O99213 Obesity complicating pregnancy, third trimester: Secondary | ICD-10-CM

## 2022-07-17 DIAGNOSIS — Z8481 Family history of carrier of genetic disease: Secondary | ICD-10-CM

## 2022-07-17 DIAGNOSIS — O10919 Unspecified pre-existing hypertension complicating pregnancy, unspecified trimester: Secondary | ICD-10-CM

## 2022-07-17 LAB — URINE CULTURE

## 2022-07-20 ENCOUNTER — Telehealth: Payer: BC Managed Care – PPO | Admitting: Dietician

## 2022-07-20 ENCOUNTER — Ambulatory Visit: Payer: BC Managed Care – PPO | Attending: Obstetrics and Gynecology

## 2022-07-20 ENCOUNTER — Other Ambulatory Visit: Payer: Self-pay

## 2022-07-20 DIAGNOSIS — O10013 Pre-existing essential hypertension complicating pregnancy, third trimester: Secondary | ICD-10-CM

## 2022-07-20 DIAGNOSIS — D6800 Von Willebrand disease, unspecified: Secondary | ICD-10-CM | POA: Insufficient documentation

## 2022-07-20 DIAGNOSIS — O99213 Obesity complicating pregnancy, third trimester: Secondary | ICD-10-CM | POA: Insufficient documentation

## 2022-07-20 DIAGNOSIS — O10913 Unspecified pre-existing hypertension complicating pregnancy, third trimester: Secondary | ICD-10-CM | POA: Insufficient documentation

## 2022-07-20 DIAGNOSIS — Z362 Encounter for other antenatal screening follow-up: Secondary | ICD-10-CM | POA: Insufficient documentation

## 2022-07-20 DIAGNOSIS — Z8481 Family history of carrier of genetic disease: Secondary | ICD-10-CM

## 2022-07-20 DIAGNOSIS — Z3A34 34 weeks gestation of pregnancy: Secondary | ICD-10-CM | POA: Insufficient documentation

## 2022-07-20 DIAGNOSIS — O10919 Unspecified pre-existing hypertension complicating pregnancy, unspecified trimester: Secondary | ICD-10-CM

## 2022-07-20 DIAGNOSIS — E669 Obesity, unspecified: Secondary | ICD-10-CM

## 2022-07-20 DIAGNOSIS — O99113 Other diseases of the blood and blood-forming organs and certain disorders involving the immune mechanism complicating pregnancy, third trimester: Secondary | ICD-10-CM | POA: Insufficient documentation

## 2022-07-20 DIAGNOSIS — Z8349 Family history of other endocrine, nutritional and metabolic diseases: Secondary | ICD-10-CM

## 2022-07-21 ENCOUNTER — Ambulatory Visit: Payer: BC Managed Care – PPO

## 2022-07-28 DIAGNOSIS — Z3A35 35 weeks gestation of pregnancy: Secondary | ICD-10-CM | POA: Insufficient documentation

## 2022-07-28 NOTE — Progress Notes (Unsigned)
    NURSE VISIT NOTE  Subjective:    Patient ID: Tamara Mcclain, female    DOB: Dec 04, 2000, 22 y.o.   MRN: [id number]  HPI  Patient is a 22 y.o. G37P0020 female who presents for fetal monitoring per order from Roberto Scales, [place].   Objective:    LMP 11/13/2021 (Approximate)  Estimated Date of Delivery: 08/28/22  [redacted]w[redacted]d  Fetus A Non-Stress Test Interpretation for 07/28/22  Indication: Chronic Hypertenstion            Assessment:   1. Chronic hypertension during pregnancy, antepartum   2. [redacted] weeks gestation of pregnancy      Plan:   Results reviewed and discussed with patient by  Philip Aspen, CNM.     Otila Kluver, LPN

## 2022-07-28 NOTE — Patient Instructions (Signed)

## 2022-07-29 ENCOUNTER — Other Ambulatory Visit (HOSPITAL_COMMUNITY)
Admission: RE | Admit: 2022-07-29 | Discharge: 2022-07-29 | Disposition: A | Discharge status: Home / Self Care | Payer: BC Managed Care – PPO | Source: Ambulatory Visit | Attending: Certified Nurse Midwife | Admitting: Certified Nurse Midwife

## 2022-07-29 ENCOUNTER — Ambulatory Visit (INDEPENDENT_AMBULATORY_CARE_PROVIDER_SITE_OTHER): Payer: BC Managed Care – PPO

## 2022-07-29 ENCOUNTER — Encounter: Payer: Self-pay | Admitting: Certified Nurse Midwife

## 2022-07-29 ENCOUNTER — Ambulatory Visit (INDEPENDENT_AMBULATORY_CARE_PROVIDER_SITE_OTHER): Payer: BC Managed Care – PPO | Admitting: Certified Nurse Midwife

## 2022-07-29 VITALS — BP 116/86 | HR 89 | Ht 60.0 in | Wt 180.5 lb

## 2022-07-29 VITALS — BP 116/86 | HR 89 | Wt 180.5 lb

## 2022-07-29 DIAGNOSIS — O99210 Obesity complicating pregnancy, unspecified trimester: Secondary | ICD-10-CM

## 2022-07-29 DIAGNOSIS — O10919 Unspecified pre-existing hypertension complicating pregnancy, unspecified trimester: Secondary | ICD-10-CM

## 2022-07-29 DIAGNOSIS — Z3A35 35 weeks gestation of pregnancy: Secondary | ICD-10-CM

## 2022-07-29 DIAGNOSIS — O10013 Pre-existing essential hypertension complicating pregnancy, third trimester: Secondary | ICD-10-CM

## 2022-07-29 DIAGNOSIS — Z3685 Encounter for antenatal screening for Streptococcus B: Secondary | ICD-10-CM | POA: Diagnosis not present

## 2022-07-29 DIAGNOSIS — Z113 Encounter for screening for infections with a predominantly sexual mode of transmission: Secondary | ICD-10-CM

## 2022-07-29 DIAGNOSIS — O0990 Supervision of high risk pregnancy, unspecified, unspecified trimester: Secondary | ICD-10-CM

## 2022-07-29 LAB — POCT URINALYSIS DIPSTICK OB
Blood, UA: NEGATIVE
Glucose, UA: NEGATIVE
Ketones, UA: NEGATIVE
Nitrite, UA: NEGATIVE
Spec Grav, UA: 1.025 (ref 1.010–1.025)
Urobilinogen, UA: 0.2 E.U./dL
pH, UA: 5 (ref 5.0–8.0)

## 2022-07-29 NOTE — Patient Instructions (Signed)

## 2022-07-29 NOTE — Progress Notes (Signed)
ROB & NST for chronic hypertension . See nurse note for strip review. Strip reactive. GBS and cultures collected today. Discussed vaginal exams at 37 wks should she desire. She is in agreement to plan . Follow up 1 wk for ROB and NST.   Philip Aspen, CNM   Body mass index is 35.25 kg/m.

## 2022-07-30 ENCOUNTER — Encounter: Payer: Self-pay | Admitting: Certified Nurse Midwife

## 2022-07-31 LAB — CERVICOVAGINAL ANCILLARY ONLY
Chlamydia: NEGATIVE
Comment: NEGATIVE
Comment: NORMAL
Neisseria Gonorrhea: NEGATIVE

## 2022-07-31 LAB — STREP GP B NAA: Strep Gp B NAA: NEGATIVE

## 2022-08-04 DIAGNOSIS — Z3A36 36 weeks gestation of pregnancy: Secondary | ICD-10-CM | POA: Insufficient documentation

## 2022-08-04 NOTE — Patient Instructions (Signed)

## 2022-08-04 NOTE — Progress Notes (Unsigned)
    NURSE VISIT NOTE  Subjective:    Patient ID: Tamara Mcclain, female    DOB: 2000-06-26, 22 y.o.   MRN: [id number]  HPI  Patient is a 22 y.o. G75P0020 female who presents for fetal monitoring per order from Philip Aspen, CNM.   Objective:    LMP 11/13/2021 (Approximate)  Estimated Date of Delivery: 08/28/22  [redacted]w[redacted]d  Fetus A Non-Stress Test Interpretation for 08/04/22  Indication: Chronic Hypertenstion            Assessment:   1. Chronic hypertension during pregnancy, antepartum   2. [redacted] weeks gestation of pregnancy      Plan:   Results reviewed and discussed with patient by  Philip Aspen, CNM.     Otila Kluver, LPN

## 2022-08-05 ENCOUNTER — Telehealth: Payer: Self-pay

## 2022-08-05 ENCOUNTER — Ambulatory Visit (INDEPENDENT_AMBULATORY_CARE_PROVIDER_SITE_OTHER): Payer: BC Managed Care – PPO | Admitting: Certified Nurse Midwife

## 2022-08-05 ENCOUNTER — Other Ambulatory Visit: Payer: Self-pay

## 2022-08-05 ENCOUNTER — Emergency Department: Payer: BC Managed Care – PPO

## 2022-08-05 ENCOUNTER — Ambulatory Visit (INDEPENDENT_AMBULATORY_CARE_PROVIDER_SITE_OTHER): Payer: BC Managed Care – PPO

## 2022-08-05 ENCOUNTER — Encounter: Payer: Self-pay | Admitting: Certified Nurse Midwife

## 2022-08-05 ENCOUNTER — Emergency Department
Admission: EM | Admit: 2022-08-05 | Discharge: 2022-08-05 | Disposition: A | Discharge status: Home / Self Care | Payer: BC Managed Care – PPO | Attending: Emergency Medicine | Admitting: Emergency Medicine

## 2022-08-05 ENCOUNTER — Encounter: Payer: Self-pay | Admitting: Emergency Medicine

## 2022-08-05 VITALS — BP 121/85 | HR 89 | Wt 181.9 lb

## 2022-08-05 VITALS — BP 121/85 | HR 89 | Ht 60.0 in | Wt 181.9 lb

## 2022-08-05 DIAGNOSIS — W19XXXA Unspecified fall, initial encounter: Secondary | ICD-10-CM

## 2022-08-05 DIAGNOSIS — S93491A Sprain of other ligament of right ankle, initial encounter: Secondary | ICD-10-CM | POA: Diagnosis not present

## 2022-08-05 DIAGNOSIS — W010XXA Fall on same level from slipping, tripping and stumbling without subsequent striking against object, initial encounter: Secondary | ICD-10-CM | POA: Diagnosis not present

## 2022-08-05 DIAGNOSIS — S93401A Sprain of unspecified ligament of right ankle, initial encounter: Secondary | ICD-10-CM | POA: Diagnosis not present

## 2022-08-05 DIAGNOSIS — O10013 Pre-existing essential hypertension complicating pregnancy, third trimester: Secondary | ICD-10-CM | POA: Diagnosis not present

## 2022-08-05 DIAGNOSIS — Z3A36 36 weeks gestation of pregnancy: Secondary | ICD-10-CM

## 2022-08-05 DIAGNOSIS — O10919 Unspecified pre-existing hypertension complicating pregnancy, unspecified trimester: Secondary | ICD-10-CM

## 2022-08-05 DIAGNOSIS — O9A213 Injury, poisoning and certain other consequences of external causes complicating pregnancy, third trimester: Secondary | ICD-10-CM | POA: Insufficient documentation

## 2022-08-05 DIAGNOSIS — Z043 Encounter for examination and observation following other accident: Secondary | ICD-10-CM | POA: Diagnosis not present

## 2022-08-05 DIAGNOSIS — Z3483 Encounter for supervision of other normal pregnancy, third trimester: Secondary | ICD-10-CM

## 2022-08-05 NOTE — ED Provider Notes (Signed)
[facility] Provider Note    Event Date/Time   First MD Initiated Contact with Patient 08/05/22 1105     (approximate)   History   Fall   HPI  Tamara Mcclain is a 22 y.o. female   presents to the ED with pain of right ankle pain after slipping on wet concrete.  Patient states that there was no other injury other than hitting her left knee.  She denies any head injury, loss of consciousness, abdominal injury, vaginal pain, vaginal bleeding or discharge.  Currently is [redacted] weeks pregnant.      Physical Exam   Triage Vital Signs: ED Triage Vitals  Enc Vitals Group     BP 08/05/22 1044 119/89     Pulse Rate 08/05/22 1044 90     Resp 08/05/22 1044 18     Temp 08/05/22 1044 98.2 F (36.8 C)     Temp Source 08/05/22 1044 Oral     SpO2 08/05/22 1044 96 %     Weight --      Height --      Head Circumference --      Peak Flow --      Pain Score 08/05/22 1039 8     Pain Loc --      Pain Edu? --      Excl. in GC? --     Most recent vital signs: Vitals:   08/05/22 1044  BP: 119/89  Pulse: 90  Resp: 18  Temp: 98.2 F (36.8 C)  SpO2: 96%     General: Awake, no distress.  Alert, able to answer questions appropriately. CV:  Good peripheral perfusion.  Heart regular rate and rhythm. Resp:  Normal effort.  Lungs are clear. Abd:  No distention.  Pregnant. Other:  Examination of the right ankle there is no gross deformity.  Mild generalized soft tissue tenderness on palpation of the lateral malleolus.  No ecchymosis or abrasions are seen.  Patient is able to flex and extend.  Pulses are present.  Skin intact.   ED Results / Procedures / Treatments   Labs (all labs ordered are listed, but only abnormal results are displayed) Labs Reviewed - No data to display   RADIOLOGY Right ankle x-ray images were reviewed and interpreted by myself independent of the radiologist and no fracture or dislocation is noted.  Official radiology report reviewed  and is negative.    PROCEDURES:  Critical Care performed:   Procedures   MEDICATIONS ORDERED IN ED: Medications - No data to display   IMPRESSION / MDM / ASSESSMENT AND PLAN / ED COURSE  I reviewed the triage vital signs and the nursing notes.   Differential diagnosis includes, but is not limited to, right ankle sprain, fracture, contusion,   22 year old female presents to the ED with concerns of injury to her right ankle.  Patient states she slipped on wet concrete but did not have an injury to her abdomen, head and denies any other injuries other than her ankle.  X-rays were negative for fracture and patient was reassured.  She was placed in a cam boot for protection and support.  She is instructed to ice and elevate to reduce swelling and help with pain.  She is aware that since she is [redacted] weeks pregnant that she can only take Tylenol sparingly for pain and not anti-inflammatories.  Patient has an appointment with her OB/GYN later today.      Patient's presentation is most consistent with  acute complicated illness / injury requiring diagnostic workup.  FINAL CLINICAL IMPRESSION(S) / ED DIAGNOSES   Final diagnoses:  Sprain of right ankle, unspecified ligament, initial encounter  Fall, initial encounter     Rx / DC Orders   ED Discharge Orders     None        Note:  This document was prepared using Dragon voice recognition software and may include unintentional dictation errors.   Tommi Rumps, PA-C 08/05/22 1414    Shaune Pollack, MD 08/06/22 [phone]

## 2022-08-05 NOTE — Progress Notes (Signed)
Pt was seen in ED earlier today states she slipped and twisted her ankle. She did not fall , she did not hit her abdomen. State she hit her knee. She is here today for ROB. She is feeling good movement but not much currently . She state she has not eaten anything due to being at hospital all morning. Crackers and juice given to her.   NST for history Chronic hypertension ( not currently on meds). Reactive seen nurse documentation.   First bp elevated, repeat: 121/85   Discussed induction closer to 40 wks due to history chronic hypertension. She agrees . Follow up 1 wk for ROB and NST.

## 2022-08-05 NOTE — Telephone Encounter (Signed)
Notified Gigi Gin, CNM to expect patient in L&D.

## 2022-08-05 NOTE — Patient Instructions (Signed)
Braxton Hicks Contractions  Contractions of the uterus can occur throughout pregnancy, but they are not always a sign that you are in labor. You may have practice contractions called Braxton Hicks contractions. These false labor contractions are sometimes confused with true labor. What are Braxton Hicks contractions? Braxton Hicks contractions are tightening movements that occur in the muscles of the uterus before labor. Unlike true labor contractions, these contractions do not result in opening (dilation) and thinning of the lowest part of the uterus (cervix). Toward the end of pregnancy (32-34 weeks), Braxton Hicks contractions can happen more often and may become stronger. These contractions are sometimes difficult to tell apart from true labor because they can be very uncomfortable. How to tell the difference between true labor and false labor True labor Contractions last 30-70 seconds. Contractions become very regular. Discomfort is usually felt in the top of the uterus, and it spreads to the lower abdomen and low back. Contractions do not go away with walking. Contractions usually become stronger and more frequent. The cervix dilates and gets thinner. False labor Contractions are usually shorter, weaker, and farther apart than true labor contractions. Contractions are usually irregular. Contractions are often felt in the front of the lower abdomen and in the groin. Contractions may go away when you walk around or change positions while lying down. The cervix usually does not dilate or become thin. Sometimes, the only way to tell if you are in true labor is for your health care provider to look for changes in your cervix. Your health care provider will do a physical exam and may monitor your contractions. If you are in true labor, your health care provider will send you home with instructions about when to return to the hospital. You may continue to have Braxton Hicks contractions until you  go into true labor. Follow these instructions at home:  Take over-the-counter and prescription medicines only as told by your health care provider. If Braxton Hicks contractions are making you uncomfortable: Change your position from lying down or resting to walking, or change from walking to resting. Sit and rest in a tub of warm water. Drink enough fluid to keep your urine pale yellow. Dehydration may cause these contractions. Do slow and deep breathing several times an hour. Keep all follow-up visits. This is important. Contact a health care provider if: You have a fever. You have continuous pain in your abdomen. Your contractions become stronger, more regular, and closer together. You pass blood-tinged mucus. Get help right away if: You have fluid leaking or gushing from your vagina. You have bright red blood coming from your vagina. Your baby is not moving inside you as much as it used to. Summary You may have practice contractions called Braxton Hicks contractions. These false labor contractions are sometimes confused with true labor. Braxton Hicks contractions are usually shorter, weaker, farther apart, and less regular than true labor contractions. True labor contractions usually become stronger, more regular, and more frequent. Manage discomfort from Braxton Hicks contractions by changing position, resting in a warm bath, practicing deep breathing, and drinking plenty of water. Keep all follow-up visits. Contact your health care provider if your contractions become stronger, more regular, and closer together. This information is not intended to replace advice given to you by your health care provider. Make sure you discuss any questions you have with your health care provider. Document Revised: 03/04/2020 Document Reviewed: 03/04/2020 Elsevier Patient Education  2023 [employer]vier Inc.  

## 2022-08-05 NOTE — ED Triage Notes (Signed)
Patient to ED for fall- right ankle. Slipped on wet concrete. Unable to place any weight on ankle. [redacted] weeks pregnant- denies any complaints with pregnancy.

## 2022-08-05 NOTE — Telephone Encounter (Signed)
Patient advised she is at Emergency Department for a possible broken right ankle. She slipped on wet pavement at home, twisted to avoid landing on stomach and heard a pop in her ankle. She is scheduled for NST/ROB this afternoon at our office. Advised to notify Emergency Department that she will need to be sent to L&D for clearance.

## 2022-08-05 NOTE — Discharge Instructions (Signed)
Follow-up Dr. Luana Shu if any continued problems with your ankle.  Wear the cam walker for support and protection.  You may take Tylenol as needed for pain and ice and elevation.  Wear the cam walker until you are able to stand and bear weight without any pain.

## 2022-08-06 ENCOUNTER — Inpatient Hospital Stay: Payer: BC Managed Care – PPO | Attending: Oncology

## 2022-08-06 ENCOUNTER — Encounter: Payer: Self-pay | Admitting: Certified Nurse Midwife

## 2022-08-06 DIAGNOSIS — D6800 Von Willebrand disease, unspecified: Secondary | ICD-10-CM | POA: Diagnosis present

## 2022-08-06 DIAGNOSIS — R799 Abnormal finding of blood chemistry, unspecified: Secondary | ICD-10-CM

## 2022-08-10 LAB — COAG STUDIES INTERP REPORT

## 2022-08-10 LAB — VON WILLEBRAND PANEL
Coagulation Factor VIII: 306 % — ABNORMAL HIGH (ref 56–140)
Ristocetin Co-factor, Plasma: 234 % — ABNORMAL HIGH (ref 50–200)
Von Willebrand Antigen, Plasma: 434 % — ABNORMAL HIGH (ref 50–200)

## 2022-08-12 IMAGING — CR DG CHEST 2V
1 series · 2 of 2 positions shown · non-contrast
Comparison: Chest two views 07/24/2021

CLINICAL DATA: Chest pain

EXAM:
CHEST - 2 VIEW

[Series 1: dg chest 2 view · 0.14mm/px · 2 of 2 slices shown]
[im 1/2]
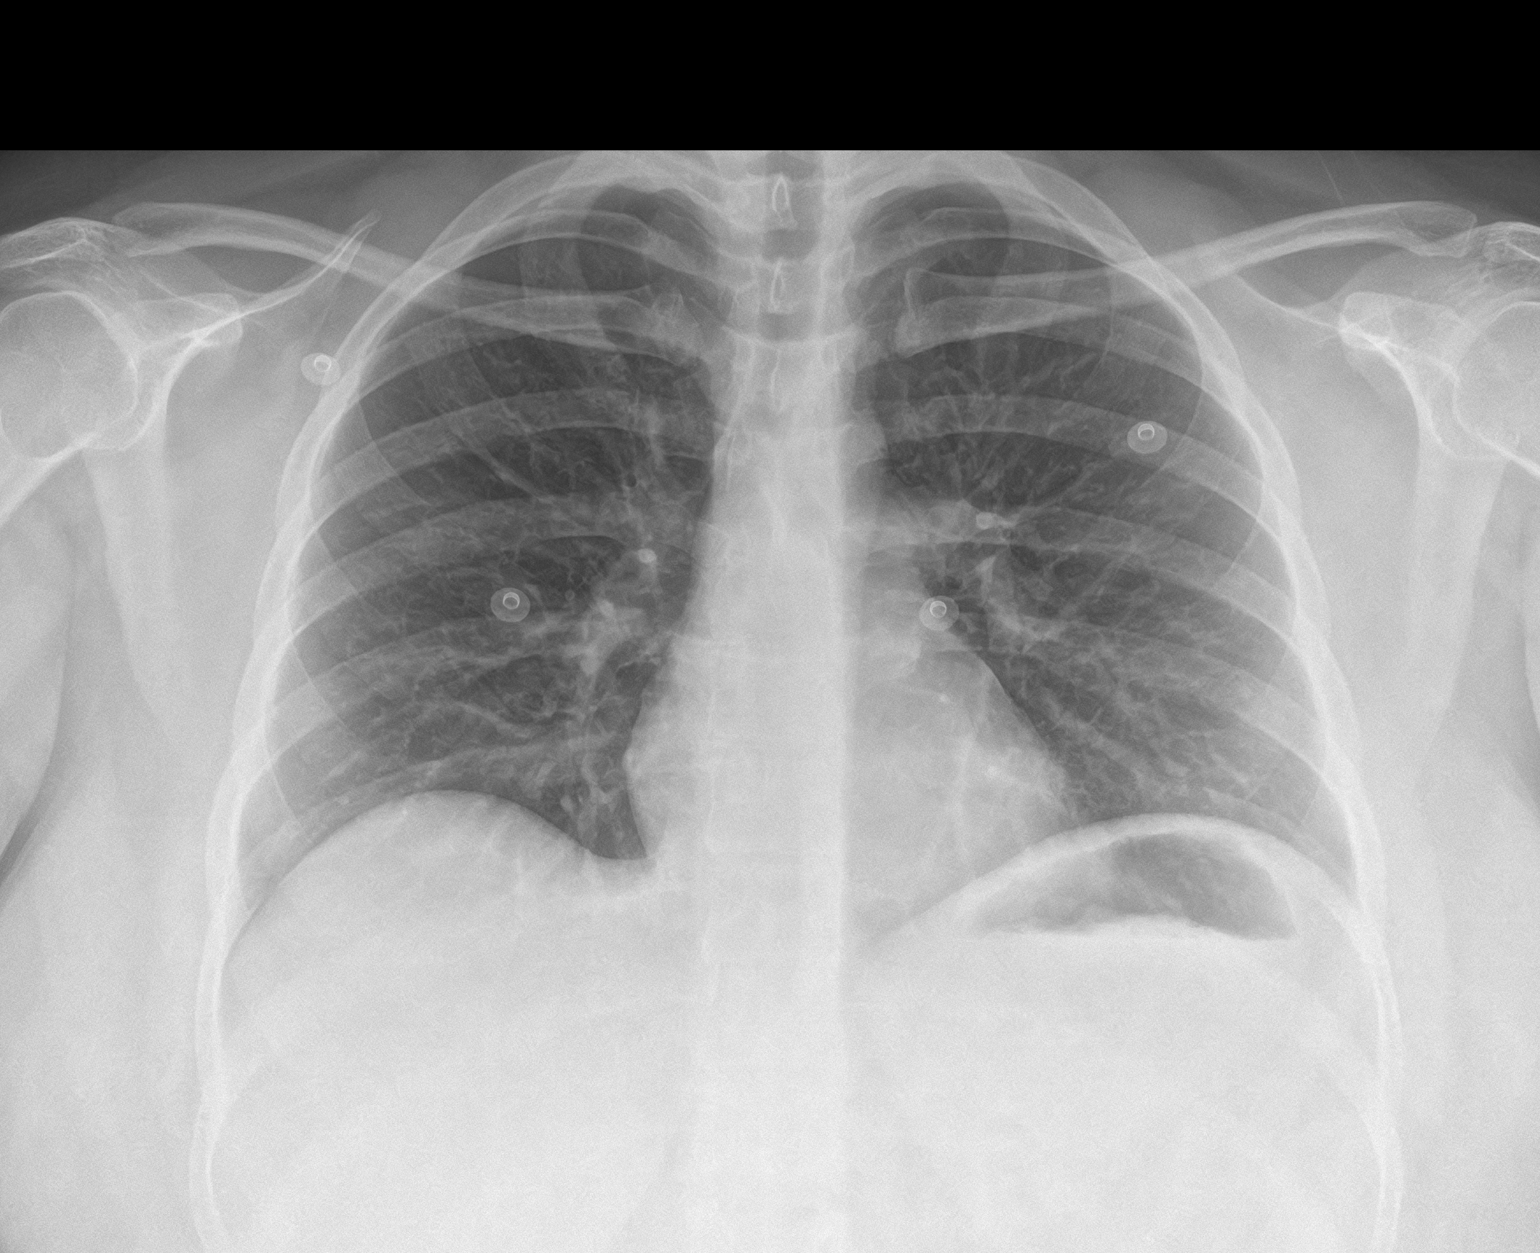
[im 2/2]
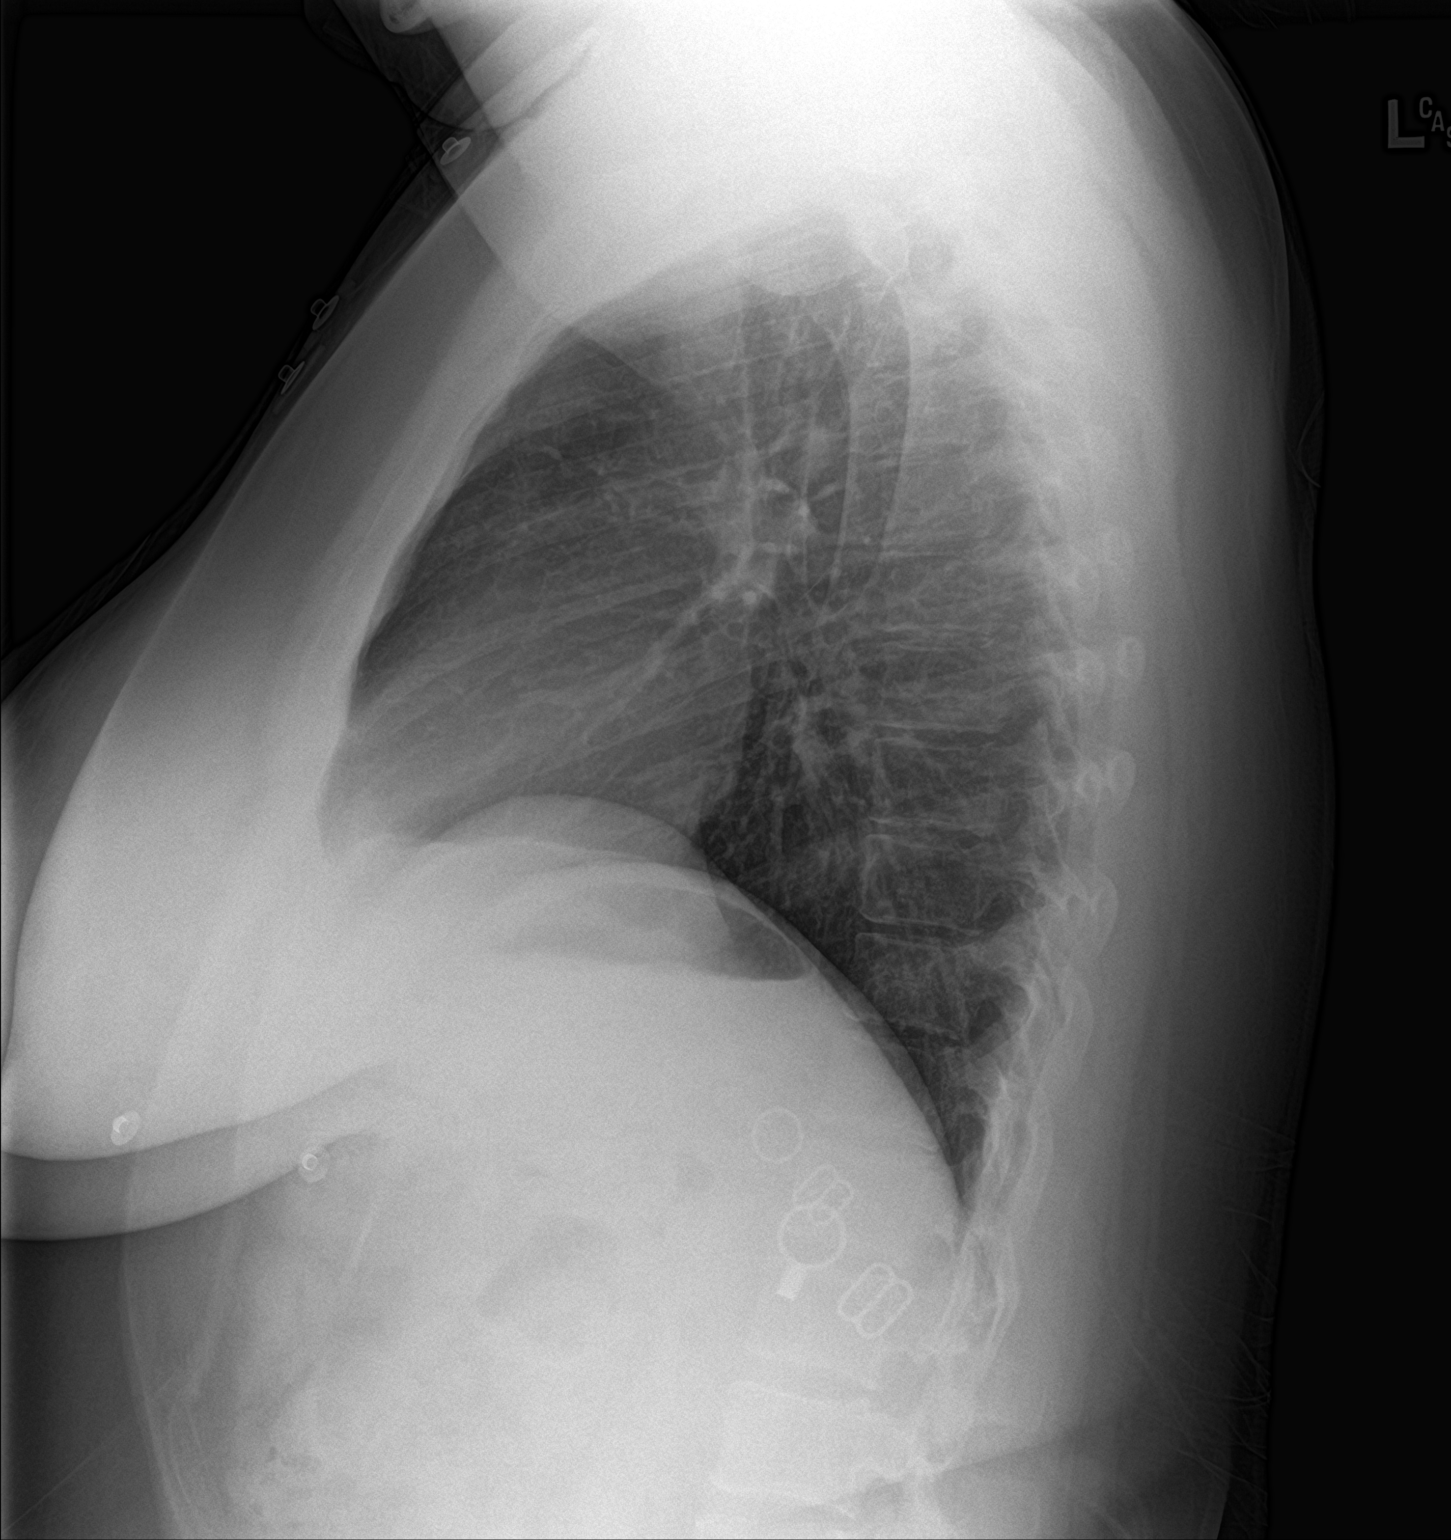

[2 of 2 positions shown; findings below may reference images not displayed]

FINDINGS: Cardiac silhouette and mediastinal contours are within normal
limits. The lungs are clear. No pleural effusion or pneumothorax. No
acute skeletal abnormality.
IMPRESSION: No active cardiopulmonary disease.

## 2022-08-13 ENCOUNTER — Inpatient Hospital Stay: Payer: BC Managed Care – PPO | Attending: Oncology | Admitting: Oncology

## 2022-08-13 ENCOUNTER — Encounter: Payer: Self-pay | Admitting: Oncology

## 2022-08-13 VITALS — BP 128/85 | HR 94 | Temp 97.5°F | Resp 18 | Wt 182.7 lb

## 2022-08-13 DIAGNOSIS — D6800 Von Willebrand disease, unspecified: Secondary | ICD-10-CM | POA: Diagnosis not present

## 2022-08-13 DIAGNOSIS — Z3A11 11 weeks gestation of pregnancy: Secondary | ICD-10-CM | POA: Diagnosis not present

## 2022-08-13 DIAGNOSIS — O99111 Other diseases of the blood and blood-forming organs and certain disorders involving the immune mechanism complicating pregnancy, first trimester: Secondary | ICD-10-CM | POA: Diagnosis present

## 2022-08-13 NOTE — Progress Notes (Signed)
Hematology/Oncology Progress Note [phone] [phone]      CHIEF COMPLAINTS/REASON FOR VISIT:  Follow up for history of von Willebrand disease  ASSESSMENT & PLAN:   Von Willebrand disease Self reported history of VWD Labs are reviewed and discussed with patient. - previous baseline von Willebrand testing was not consistent with von Willebrand disease.  She also does not have any history of excessive bleeding history.   - VWD levels done during first trimester and third trimester, - no deficiency.  - We discussed that pregnancy may increase von Willebrand levels. She does not need DDAVP or factor replacement prior to delivery. The postpartum period requires extra vigilance. Recommend to repeat von Willebrand panel 2 days after delivery and 2 weeks after delivery- discussed with her Ob team Dominic, Alma Friendly via secure chat. She will order labs.  She will follow up with me in 3 months.    No orders of the defined types were placed in this encounter. Follow up in 3 months.   All questions were answered. The patient knows to call the clinic with any problems, questions or concerns.  Earlie Server, MD, PhD [facility] Hematology Oncology 08/13/2022   HISTORY OF PRESENTING ILLNESS:   Tamara Mcclain is a  22 y.o.  female with PMH listed below was seen in consultation at the request of  Jearld Fenton, NP  for evaluation of von Willebrand disease Patient is currently pregnant, 11 weeks Patient reports history of von Willebrand disease which was diagnosed at [facility] in 2014.  She had heavy menstrual period when she was 22 years of age.  Extensive medical records review in care everywhere was performed by me.  02/16/2013 patient was seen by Dr. Barrie Folk at [facility] for menorrhagia.  Patient did not have prior history of bleeding symptoms. denies increase in bruising or other type of bleeding such as epistaxis, gum bleeds or hematuria. She had several teeth extractions  and family can not recall complications due to bleeding  02/16/2013  PT and PTT were both within normal limits.  Normal platelet function testing.  Von Willebrand panels were sent. von Willebrand factor antigen 134 [47-157%], von Willebrand factor activity 140 [50-168%], factor VIII activity 180 [54-161%].  Multimer was not indicated.  Antithrombin III activity 126 [83-123%] patient also had hypercoagulable work-up which were negative [normal protein S activity, protein S antigen, normal protein C activity, negative anticardiolipin antibodies, negative beta-2 glycoprotein antibodies.  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 Patient was recommended to start on OCP which she continued use until 2020. 03/16/2013, patient had a follow-up visit.  There was no documentation of official von Willebrand disease in her note.  The test result is not consistent with von Willebrand disease.  Patient reports that she was told by her parents that she has "von Willebrand disease". Currently pregnant with her first child. Denies any easy bruising or active bleeding events.  INTERVAL HISTORY Tamara Mcclain is a 22 y.o. female who has above history reviewed by me today presents for follow up visit for Follow up for history of von Willebrand disease Her due date is 08/28/22. No bleeding events. No new complaints.   MEDICAL HISTORY:  Past Medical History:  Diagnosis Date   Anxiety    Epiploic appendagitis    Kidney stones 04/13/2022   Right Flank   MTHFR mutation    Von Willebrand disease Dr. Grandville Silos at [facility]  SURGICAL HISTORY: Past Surgical History:  Procedure Laterality Date   ESOPHAGOGASTRODUODENOSCOPY (EGD) WITH PROPOFOL N/A 01/30/2020   Procedure: ESOPHAGOGASTRODUODENOSCOPY (EGD) WITH PROPOFOL;  Surgeon: Jonathon Bellows, MD;  Location: [facility];  Service: Gastroenterology;  Laterality: N/A;   WISDOM TOOTH EXTRACTION   08/2018   no bleeding complications    SOCIAL HISTORY: Social History   Socioeconomic History   Marital status: Married    Spouse name: Naiyeli Rotella.   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    Comment: [employer]  Tobacco Use   Smoking status: Never    Passive exposure: Past   Smokeless tobacco: Never  Vaping Use   Vaping Use: Never used  Substance and Sexual Activity   Alcohol use: No    Alcohol/week: 0.0 standard drinks of alcohol   Drug use: No   Sexual activity: Yes    Partners: Male    Birth control/protection: None  Other Topics Concern   Not on file  Social History Narrative   Not on file   Social Determinants of Health   Financial Resource Strain: Low Risk  (12/31/2021)   Overall Financial Resource Strain (CARDIA)    Difficulty of Paying Living Expenses: Not hard at all  Food Insecurity: Not on file  Transportation Needs: No Transportation Needs (12/31/2021)   PRAPARE - Transportation    Lack of Transportation (Medical): No    Lack of Transportation (Non-Medical): No  Physical Activity: Sufficiently Active (12/31/2021)   Exercise Vital Sign    Days of Exercise per Week: 4 days    Minutes of Exercise per Session: 150+ min  Stress: No Stress Concern Present (12/31/2021)   [place]    Feeling of Stress : Not at all  Social Connections: Moderately Integrated (12/31/2021)   Social Connection and Isolation Panel [NHANES]    Frequency of Communication with Friends and Family: More than three times a week    Frequency of Social Gatherings with Friends and Family: Three times a week    Attends Religious Services: More than 4 times per year    Active Member of Clubs or Organizations: No    Attends Archivist Meetings: Never    Marital Status: Living with partner  Intimate  Partner Violence: Not At Risk (12/31/2021)   Humiliation, Afraid, Rape, and Kick questionnaire    Fear of Current or Ex-Partner: No    Emotionally Abused: No    Physically Abused: No    Sexually Abused: No    FAMILY HISTORY: Family History  Problem Relation Age of Onset   Hypertension Father    Pulmonary embolism Father    Cancer Maternal Grandmother 40       breast   Arthritis Maternal Grandmother    Hyperlipidemia Maternal Grandmother    CAD Maternal Grandfather    Cancer Paternal Grandmother        melanoma, sinus cancer   Colon polyps Paternal Grandmother    Heart disease Paternal Grandfather    Gaucher's disease Half-Brother    Esophageal cancer Neg Hx    Stomach cancer Neg Hx    Liver disease Neg Hx     ALLERGIES:  is allergic to tylenol [acetaminophen] and depo-medrol [methylprednisolone].  MEDICATIONS:  Current Outpatient Medications  Medication Sig Dispense Refill   albuterol (VENTOLIN HFA) 108 (90 Base) MCG/ACT inhaler Inhale 1-2 puffs  into the lungs every 6 (six) hours as needed for wheezing or shortness of breath. 8 g 1   ondansetron (ZOFRAN) 4 MG tablet Take 1 tablet (4 mg total) by mouth every 8 (eight) hours as needed for nausea or vomiting. 20 tablet 1   Prenatal Vit-Fe Fumarate-FA (PRENATAL VITAMINS) 28-0.8 MG TABS Take 1 tablet by mouth daily.     No current facility-administered medications for this visit.    Review of Systems  Constitutional:  Negative for appetite change, chills, fatigue and fever.  HENT:   Negative for hearing loss and voice change.   Eyes:  Negative for eye problems.  Respiratory:  Negative for chest tightness and cough.   Cardiovascular:  Negative for chest pain.  Gastrointestinal:  Negative for abdominal distention, abdominal pain and blood in stool.  Endocrine: Negative for hot flashes.  Genitourinary:  Negative for difficulty urinating and frequency.   Musculoskeletal:  Negative for arthralgias.  Skin:  Negative for itching  and rash.  Neurological:  Negative for extremity weakness.  Hematological:  Negative for adenopathy.  Psychiatric/Behavioral:  Negative for confusion.    PHYSICAL EXAMINATION: ECOG PERFORMANCE STATUS: 0 - Asymptomatic Vitals:   08/13/22 1352  BP: 128/85  Pulse: 94  Resp: 18  Temp: (!) 97.5 F (36.4 C)   Filed Weights   08/13/22 1352  Weight: 182 lb 11.2 oz (82.9 kg)    Physical Exam Constitutional:      General: She is not in acute distress. HENT:     Head: Normocephalic and atraumatic.  Eyes:     General: No scleral icterus. Cardiovascular:     Rate and Rhythm: Normal rate.  Pulmonary:     Effort: Pulmonary effort is normal. No respiratory distress.  Abdominal:     General: There is no distension.  Musculoskeletal:        General: No deformity. Normal range of motion.     Cervical back: Normal range of motion and neck supple.  Skin:    Findings: No erythema.  Neurological:     Mental Status: She is alert and oriented to person, place, and time. Mental status is at baseline.  Psychiatric:        Mood and Affect: Mood normal.     LABORATORY DATA:  I have reviewed the data as listed    Latest Ref Rng & Units 06/22/2022   10:03 AM 06/09/2022    9:26 AM 05/28/2022    5:18 PM  CBC  WBC 4.0 - 10.5 K/uL 13.1  13.1  13.2   Hemoglobin 12.0 - 15.0 g/dL 11.1  11.8  11.4   Hematocrit 36.0 - 46.0 % 32.8  36.2  33.4   Platelets 150 - 400 K/uL 281  324  286       Latest Ref Rng & Units 03/07/2022   11:07 AM 02/10/2022    4:46 PM 02/02/2022   10:02 AM  CMP  Glucose 70 - 99 mg/dL 89  97  95   BUN 6 - 20 mg/dL <5  <5  6   Creatinine 0.44 - 1.00 mg/dL 0.64  0.65  0.67   Sodium 135 - 145 mmol/L 137  137  137   Potassium 3.5 - 5.1 mmol/L 3.8  3.7  3.9   Chloride 98 - 111 mmol/L 107  102  107   CO2 22 - 32 mmol/L 22  21  23    Calcium 8.9 - 10.3 mg/dL 9.1  9.9  9.4   Total Protein 6.5 -  8.1 g/dL 7.3  7.7  7.2   Total Bilirubin 0.3 - 1.2 mg/dL 1.4  1.4  1.1   Alkaline  Phos 38 - 126 U/L 65  73  70   AST 15 - 41 U/L 13  14  16    ALT 0 - 44 U/L 8  8  15

## 2022-08-13 NOTE — Assessment & Plan Note (Signed)
Self reported history of VWD Labs are reviewed and discussed with patient. - previous baseline von Willebrand testing was not consistent with von Willebrand disease.  She also does not have any history of excessive bleeding history.   - VWD levels done during first trimester and third trimester, - no deficiency.  - We discussed that pregnancy may increase von Willebrand levels. She does not need DDAVP or factor replacement prior to delivery. The postpartum period requires extra vigilance. Recommend to repeat von Willebrand panel 2 days after delivery and 2 weeks after delivery- discussed with her Ob team Dominic, Alma Friendly via secure chat. She will order labs.  She will follow up with me in 3 months.

## 2022-08-13 NOTE — Progress Notes (Signed)
    NURSE VISIT NOTE  Subjective:    Patient ID: Tamara Mcclain, female    DOB: August 25, 2000, 22 y.o.   MRN: [id number]  HPI  Patient is a 22 y.o. G37P0020 female who presents for fetal monitoring per order from Doreene Burke, CNM.   Objective:    BP (!) 124/95   Pulse (!) 112   Ht 5' (1.524 m)   Wt 182 lb 3.2 oz (82.6 kg)   LMP 11/13/2021 (Approximate)   BMI 35.58 kg/m  Estimated Date of Delivery: 08/28/22  [redacted]w[redacted]d  Fetus A Non-Stress Test Interpretation for 08/14/22  Indication: Chronic Hypertenstion  Fetal Heart Rate A Mode: External Baseline Rate (A): 130 bpm Variability: Moderate Accelerations: 15 x 15 Decelerations: None Multiple birth?: No  Uterine Activity Mode: Toco Contraction Frequency (min): None  Interpretation (Fetal Testing) Nonstress Test Interpretation: Reactive Overall Impression: Reassuring for gestational age   Assessment:   1. Chronic hypertension during pregnancy, antepartum   2. [redacted] weeks gestation of pregnancy      Plan:   Results reviewed and discussed with patient by  Vonzella Nipple, PA-C.     Rocco Serene, LPN

## 2022-08-13 NOTE — Patient Instructions (Signed)

## 2022-08-13 NOTE — Progress Notes (Signed)
Pt here for follow up. Pt is currently [redacted] weeks pregnant

## 2022-08-14 ENCOUNTER — Ambulatory Visit (INDEPENDENT_AMBULATORY_CARE_PROVIDER_SITE_OTHER): Payer: BC Managed Care – PPO

## 2022-08-14 ENCOUNTER — Encounter: Payer: Self-pay | Admitting: Medical

## 2022-08-14 ENCOUNTER — Ambulatory Visit (INDEPENDENT_AMBULATORY_CARE_PROVIDER_SITE_OTHER): Payer: BC Managed Care – PPO | Admitting: Medical

## 2022-08-14 VITALS — BP 122/87 | HR 106 | Ht 60.0 in | Wt 182.2 lb

## 2022-08-14 VITALS — BP 122/87 | HR 106 | Wt 182.2 lb

## 2022-08-14 DIAGNOSIS — O10013 Pre-existing essential hypertension complicating pregnancy, third trimester: Secondary | ICD-10-CM

## 2022-08-14 DIAGNOSIS — O10919 Unspecified pre-existing hypertension complicating pregnancy, unspecified trimester: Secondary | ICD-10-CM

## 2022-08-14 DIAGNOSIS — Z8481 Family history of carrier of genetic disease: Secondary | ICD-10-CM

## 2022-08-14 DIAGNOSIS — O99113 Other diseases of the blood and blood-forming organs and certain disorders involving the immune mechanism complicating pregnancy, third trimester: Secondary | ICD-10-CM

## 2022-08-14 DIAGNOSIS — O99213 Obesity complicating pregnancy, third trimester: Secondary | ICD-10-CM

## 2022-08-14 DIAGNOSIS — Z3A38 38 weeks gestation of pregnancy: Secondary | ICD-10-CM

## 2022-08-14 DIAGNOSIS — D6800 Von Willebrand disease, unspecified: Secondary | ICD-10-CM

## 2022-08-14 DIAGNOSIS — O99210 Obesity complicating pregnancy, unspecified trimester: Secondary | ICD-10-CM

## 2022-08-14 DIAGNOSIS — O99891 Other specified diseases and conditions complicating pregnancy: Secondary | ICD-10-CM

## 2022-08-14 DIAGNOSIS — Z148 Genetic carrier of other disease: Secondary | ICD-10-CM

## 2022-08-14 DIAGNOSIS — E669 Obesity, unspecified: Secondary | ICD-10-CM

## 2022-08-14 DIAGNOSIS — O0990 Supervision of high risk pregnancy, unspecified, unspecified trimester: Secondary | ICD-10-CM

## 2022-08-14 LAB — POCT URINALYSIS DIPSTICK OB
Bilirubin, UA: NEGATIVE
Blood, UA: NEGATIVE
Glucose, UA: NEGATIVE
Ketones, UA: NEGATIVE
Nitrite, UA: NEGATIVE
Spec Grav, UA: 1.01 (ref 1.010–1.025)
Urobilinogen, UA: 0.2 E.U./dL
pH, UA: 5 (ref 5.0–8.0)

## 2022-08-14 NOTE — Progress Notes (Signed)
ROB [redacted]w[redacted]d: NST. Patient reports good fetal movement with abdominal pressure relieved by rest. Denies vaginal bleeding/leaking of fluid. She would like to discuss induction date. She reports abnormal Von Willebrand labs.

## 2022-08-14 NOTE — Progress Notes (Signed)
   PRENATAL VISIT NOTE  Subjective:  Tamara Mcclain is a 22 y.o. G3P0020 at [redacted]w[redacted]d being seen today for ongoing prenatal care.  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  Patient reports no complaints.  Contractions: Not present. Vag. Bleeding: None.  Movement: Present. Denies leaking of fluid.   The following portions of the patient's history were reviewed and updated as appropriate: allergies, current medications, past family history, past medical history, past social history, past surgical history and problem list.   Objective:   Vitals:   08/14/22 0943 08/14/22 1000  BP: (!) 124/95 122/87  Pulse: (!) 112 (!) 106  Weight: 182 lb 3.2 oz (82.6 kg)     Fetal Status: Fetal Heart Rate (bpm): NST Fundal Height: 36 cm Movement: Present     General:  Alert, oriented and cooperative. Patient is in no acute distress.  Skin: Skin is warm and dry. No rash noted.   Cardiovascular: Normal heart rate noted  Respiratory: Normal respiratory effort, no problems with respiration noted  Abdomen: Soft, gravid, appropriate for gestational age.  Pain/Pressure: Present (pressure)     Pelvic: Cervical exam performed in the presence of a chaperone Dilation: 1 Effacement (%): 20 Station: -3  Extremities: Normal range of motion.  Edema: None  Mental Status: Normal mood and affect. Normal behavior. Normal judgment and  thought content.   Assessment and Plan:  Pregnancy: G3P0020 at [redacted]w[redacted]d 1. Supervision of high risk pregnancy, antepartum - POC Urinalysis Dipstick OB - GBS negative previously   2. Von Willebrand disease - Reviewed labs and notes from oncology 3/28 with Dr. Logan Bores, no change in management at this time   3. Obesity in pregnancy  4. Family history of carrier of genetic disease  5. 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 Chronic Hypertension in pregnancy, third trimester  - no medications, normotensive  Fetal Monitoring: Baseline: 130 bpm Variability: moderate Accelerations: 15 x 15 Decelerations: none Contractions: none - Return for provider visit with NST as scheduled next week - IOL in 39th week, CMA to schedule today and inform patient   7. [redacted] weeks gestation of pregnancy - POC Urinalysis Dipstick OB  Term labor symptoms and general obstetric precautions including but not limited to vaginal bleeding, contractions, leaking of fluid and fetal movement were reviewed in detail with the patient. Please refer to After Visit Summary for other counseling recommendations.   Return in about 1 week (around 08/21/2022) for LOB, as scheduled.  Future Appointments  Date Time Provider Department Center  08/21/2022  1:00 PM [facility]-NST ROOM [facility] None  08/21/2022  1:15 PM Allie Bossier, MD [facility] None  11/16/2022  2:30 PM Rickard Patience, MD [facility] None    Vonzella Nipple, PA-C

## 2022-08-18 ENCOUNTER — Encounter: Payer: Self-pay | Admitting: Medical

## 2022-08-20 DIAGNOSIS — Z3A39 39 weeks gestation of pregnancy: Secondary | ICD-10-CM | POA: Insufficient documentation

## 2022-08-20 NOTE — Patient Instructions (Signed)

## 2022-08-20 NOTE — Progress Notes (Signed)
    NURSE VISIT NOTE  Subjective:    Patient ID: Tamara Mcclain, female    DOB: December 01, 2000, 22 y.o.   MRN: [id number]  HPI  Patient is a 22 y.o. G53P0020 female who presents for fetal monitoring per order from Vonzella Nipple, PA-C.   Objective:    BP (!) 138/92   Pulse 99   Wt 182 lb (82.6 kg)   LMP 11/13/2021 (Approximate)   BMI 35.54 kg/m  Estimated Date of Delivery: 08/28/22  [redacted]w[redacted]d  Fetus A Non-Stress Test Interpretation for 08/21/22  Indication: Chronic Hypertenstion and Obesity  Fetal Heart Rate A Mode: External Baseline Rate (A): 130 bpm Variability: Moderate Accelerations: 15 x 15 Decelerations: None Multiple birth?: No  Uterine Activity Mode: Toco Contraction Frequency (min): None  Interpretation (Fetal Testing) Nonstress Test Interpretation: Reactive Overall Impression: Reassuring for gestational age   Assessment:   1. Chronic hypertension during pregnancy, antepartum   2. Obesity in pregnancy   3. [redacted] weeks gestation of pregnancy      Plan:   Results reviewed and discussed with patient by  Nicholaus Bloom, MD.     Rocco Serene, LPN

## 2022-08-21 ENCOUNTER — Ambulatory Visit (INDEPENDENT_AMBULATORY_CARE_PROVIDER_SITE_OTHER): Payer: BC Managed Care – PPO

## 2022-08-21 ENCOUNTER — Other Ambulatory Visit: Payer: BC Managed Care – PPO

## 2022-08-21 ENCOUNTER — Ambulatory Visit (INDEPENDENT_AMBULATORY_CARE_PROVIDER_SITE_OTHER): Payer: BC Managed Care – PPO | Admitting: Obstetrics & Gynecology

## 2022-08-21 VITALS — BP 133/96 | HR 99 | Wt 182.0 lb

## 2022-08-21 VITALS — BP 138/92 | HR 99 | Ht 60.0 in | Wt 182.0 lb

## 2022-08-21 DIAGNOSIS — Z148 Genetic carrier of other disease: Secondary | ICD-10-CM

## 2022-08-21 DIAGNOSIS — O10013 Pre-existing essential hypertension complicating pregnancy, third trimester: Secondary | ICD-10-CM

## 2022-08-21 DIAGNOSIS — O99891 Other specified diseases and conditions complicating pregnancy: Secondary | ICD-10-CM

## 2022-08-21 DIAGNOSIS — O0990 Supervision of high risk pregnancy, unspecified, unspecified trimester: Secondary | ICD-10-CM

## 2022-08-21 DIAGNOSIS — O99213 Obesity complicating pregnancy, third trimester: Secondary | ICD-10-CM

## 2022-08-21 DIAGNOSIS — O99113 Other diseases of the blood and blood-forming organs and certain disorders involving the immune mechanism complicating pregnancy, third trimester: Secondary | ICD-10-CM

## 2022-08-21 DIAGNOSIS — F410 Panic disorder [episodic paroxysmal anxiety] without agoraphobia: Secondary | ICD-10-CM

## 2022-08-21 DIAGNOSIS — E669 Obesity, unspecified: Secondary | ICD-10-CM | POA: Diagnosis not present

## 2022-08-21 DIAGNOSIS — Z3A39 39 weeks gestation of pregnancy: Secondary | ICD-10-CM | POA: Diagnosis not present

## 2022-08-21 DIAGNOSIS — O10919 Unspecified pre-existing hypertension complicating pregnancy, unspecified trimester: Secondary | ICD-10-CM

## 2022-08-21 DIAGNOSIS — D6800 Von Willebrand disease, unspecified: Secondary | ICD-10-CM

## 2022-08-21 DIAGNOSIS — O99210 Obesity complicating pregnancy, unspecified trimester: Secondary | ICD-10-CM

## 2022-08-21 LAB — POCT URINALYSIS DIPSTICK OB
Bilirubin, UA: NEGATIVE
Blood, UA: NEGATIVE
Glucose, UA: NEGATIVE
Ketones, UA: NEGATIVE
Leukocytes, UA: NEGATIVE
Nitrite, UA: NEGATIVE
Spec Grav, UA: 1.01 (ref 1.010–1.025)
Urobilinogen, UA: 1 E.U./dL
pH, UA: 6.5 (ref 5.0–8.0)

## 2022-08-21 NOTE — Progress Notes (Signed)
   PRENATAL VISIT NOTE  Subjective:  Tamara Mcclain is a 22 y.o. G3P0020 at [redacted]w[redacted]d being seen today for ongoing prenatal care.  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  Patient reports no complaints.  Contractions: Not present. Vag. Bleeding: None.  Movement: Present. Denies leaking of fluid.   The following portions of the patient's history were reviewed and updated as appropriate: allergies, current medications, past family history, past medical history, past social history, past surgical history and problem list.   Objective:   Vitals:   08/21/22 1256  BP: (!) 138/92  Pulse: 99  Weight: 182 lb (82.6 kg)    Fetal Status:     Movement: Present     General:  Alert, oriented and cooperative. Patient is in no acute distress.  Skin: Skin is warm and dry. No rash noted.   Cardiovascular: Normal heart rate noted  Respiratory: Normal respiratory effort, no problems with respiration noted  Abdomen: Soft, gravid, appropriate for gestational age.  Pain/Pressure: Present     Pelvic: 1 1/2/thick/-3/soft/mid position       Extremities: Normal range of motion.  Edema: None  Mental Status: Normal mood and affect. Normal behavior. Normal judgment and thought content.   DTRs- 1+  No pedal edema NST- reactive  Assessment and Plan:  Pregnancy: G3P0020 at [redacted]w[redacted]d 1.  Supervision of high risk pregnancy, antepartum  - POC Urinalysis [facility] - IOL this Sunday  2. [redacted] weeks gestation of pregnancy  - POC Urinalysis [facility]  3. Chronic hypertension during pregnancy, antepartum - no meds, denies all symptoms of pre E today - We discussed pre eclampsia and its symptoms. Should she develop any, then she will go immediately to the ED.  4. Von Willebrand disease - plan in place per oncology   6. 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 Please refer to After Visit Summary for other counseling recommendations.   Return in about 3 weeks (around 09/11/2022) for postpartum visit.  Future Appointments  Date Time Provider Department Center  11/16/2022  2:30 PM Rickard Patience, MD [facility] None    Allie Bossier, MD

## 2022-08-23 ENCOUNTER — Other Ambulatory Visit: Payer: Self-pay | Admitting: Obstetrics

## 2022-08-23 ENCOUNTER — Other Ambulatory Visit: Payer: Self-pay

## 2022-08-23 ENCOUNTER — Encounter: Payer: Self-pay | Admitting: Obstetrics and Gynecology

## 2022-08-23 ENCOUNTER — Inpatient Hospital Stay: Payer: BC Managed Care – PPO | Admitting: Anesthesiology

## 2022-08-23 ENCOUNTER — Inpatient Hospital Stay
Admission: EM | Admit: 2022-08-23 | Discharge: 2022-08-25 | DRG: 806 | Disposition: A | Discharge status: Home / Self Care | Payer: BC Managed Care – PPO | Attending: Advanced Practice Midwife | Admitting: Advanced Practice Midwife

## 2022-08-23 DIAGNOSIS — Z8249 Family history of ischemic heart disease and other diseases of the circulatory system: Secondary | ICD-10-CM | POA: Diagnosis not present

## 2022-08-23 DIAGNOSIS — O9952 Diseases of the respiratory system complicating childbirth: Secondary | ICD-10-CM | POA: Diagnosis not present

## 2022-08-23 DIAGNOSIS — D62 Acute posthemorrhagic anemia: Secondary | ICD-10-CM | POA: Diagnosis not present

## 2022-08-23 DIAGNOSIS — Z3A39 39 weeks gestation of pregnancy: Secondary | ICD-10-CM

## 2022-08-23 DIAGNOSIS — E6609 Other obesity due to excess calories: Secondary | ICD-10-CM | POA: Diagnosis present

## 2022-08-23 DIAGNOSIS — O9902 Anemia complicating childbirth: Secondary | ICD-10-CM | POA: Diagnosis present

## 2022-08-23 DIAGNOSIS — J45909 Unspecified asthma, uncomplicated: Secondary | ICD-10-CM | POA: Diagnosis present

## 2022-08-23 DIAGNOSIS — D6800 Von Willebrand disease, unspecified: Secondary | ICD-10-CM | POA: Diagnosis not present

## 2022-08-23 DIAGNOSIS — O9903 Anemia complicating the puerperium: Secondary | ICD-10-CM

## 2022-08-23 DIAGNOSIS — Z148 Genetic carrier of other disease: Secondary | ICD-10-CM

## 2022-08-23 DIAGNOSIS — O9912 Other diseases of the blood and blood-forming organs and certain disorders involving the immune mechanism complicating childbirth: Secondary | ICD-10-CM | POA: Diagnosis present

## 2022-08-23 DIAGNOSIS — O1092 Unspecified pre-existing hypertension complicating childbirth: Secondary | ICD-10-CM | POA: Diagnosis not present

## 2022-08-23 DIAGNOSIS — O98113 Syphilis complicating pregnancy, third trimester: Secondary | ICD-10-CM | POA: Diagnosis not present

## 2022-08-23 DIAGNOSIS — O1002 Pre-existing essential hypertension complicating childbirth: Secondary | ICD-10-CM | POA: Diagnosis not present

## 2022-08-23 DIAGNOSIS — O99214 Obesity complicating childbirth: Secondary | ICD-10-CM | POA: Diagnosis present

## 2022-08-23 DIAGNOSIS — O10919 Unspecified pre-existing hypertension complicating pregnancy, unspecified trimester: Secondary | ICD-10-CM | POA: Diagnosis present

## 2022-08-23 LAB — TYPE AND SCREEN
ABO/RH(D): A POS
Antibody Screen: NEGATIVE

## 2022-08-23 LAB — COMPREHENSIVE METABOLIC PANEL
ALT: 7 U/L (ref 0–44)
AST: 17 U/L (ref 15–41)
Albumin: 2.6 g/dL — ABNORMAL LOW (ref 3.5–5.0)
Alkaline Phosphatase: 188 U/L — ABNORMAL HIGH (ref 38–126)
Anion gap: 9 (ref 5–15)
BUN: 9 mg/dL (ref 6–20)
CO2: 20 mmol/L — ABNORMAL LOW (ref 22–32)
Calcium: 8.7 mg/dL — ABNORMAL LOW (ref 8.9–10.3)
Chloride: 107 mmol/L (ref 98–111)
Creatinine, Ser: 0.55 mg/dL (ref 0.44–1.00)
GFR, Estimated: >60 mL/min (ref >60)
Glucose, Bld: 94 mg/dL (ref 70–99)
Potassium: 3.9 mmol/L (ref 3.5–5.1)
Sodium: 136 mmol/L (ref 135–145)
Total Bilirubin: 1.4 mg/dL — ABNORMAL HIGH (ref 0.3–1.2)
Total Protein: 6.4 g/dL — ABNORMAL LOW (ref 6.5–8.1)

## 2022-08-23 LAB — CBC
HCT: 35.4 % — ABNORMAL LOW (ref 36.0–46.0)
Hemoglobin: 11.8 g/dL — ABNORMAL LOW (ref 12.0–15.0)
MCH: 27.2 pg (ref 26.0–34.0)
MCHC: 33.3 g/dL (ref 30.0–36.0)
MCV: 81.6 fL (ref 80.0–100.0)
Platelets: 313 10*3/uL (ref 150–400)
RBC: 4.34 MIL/uL (ref 3.87–5.11)
RDW: 14.5 % (ref 11.5–15.5)
WBC: 13.5 10*3/uL — ABNORMAL HIGH (ref 4.0–10.5)
nRBC: 0 % (ref 0.0–0.2)

## 2022-08-23 MED ORDER — PRENATAL MULTIVITAMIN CH
1.0000 | ORAL_TABLET | Freq: Every day | ORAL | Status: DC
  Administered 2022-08-24: 1 via ORAL
  Filled 2022-08-23: qty 1

## 2022-08-23 MED ORDER — IBUPROFEN 600 MG PO TABS
ORAL_TABLET | ORAL | Status: AC
  Filled 2022-08-23: qty 1

## 2022-08-23 MED ORDER — WITCH HAZEL-GLYCERIN EX PADS
MEDICATED_PAD | CUTANEOUS | Status: AC
  Filled 2022-08-23: qty 100

## 2022-08-23 MED ORDER — MISOPROSTOL 50MCG HALF TABLET
50.0000 ug | ORAL_TABLET | ORAL | Status: DC
  Administered 2022-08-23: 50 ug via ORAL
  Filled 2022-08-23: qty 1

## 2022-08-23 MED ORDER — PHENYLEPHRINE 80 MCG/ML (10ML) SYRINGE FOR IV PUSH (FOR BLOOD PRESSURE SUPPORT): 80.0000 ug | PREFILLED_SYRINGE | INTRAVENOUS | Status: DC | PRN

## 2022-08-23 MED ORDER — MISOPROSTOL 25 MCG QUARTER TABLET
25.0000 ug | ORAL_TABLET | Freq: Once | ORAL | Status: DC
Start: 2022-08-23 — End: 2022-08-23

## 2022-08-23 MED ORDER — EPHEDRINE 5 MG/ML INJ: 10.0000 mg | INTRAVENOUS | Status: DC | PRN

## 2022-08-23 MED ORDER — FENTANYL CITRATE (PF) 100 MCG/2ML IJ SOLN: 50.0000 ug | INTRAMUSCULAR | Status: DC | PRN

## 2022-08-23 MED ORDER — LACTATED RINGERS IV SOLN: INTRAVENOUS | Status: DC

## 2022-08-23 MED ORDER — COCONUT OIL OIL: 1.0000 | TOPICAL_OIL | Status: DC | PRN

## 2022-08-23 MED ORDER — WITCH HAZEL-GLYCERIN EX PADS
1.0000 | MEDICATED_PAD | CUTANEOUS | Status: DC | PRN
  Administered 2022-08-23: 1 via TOPICAL

## 2022-08-23 MED ORDER — MISOPROSTOL 200 MCG PO TABS
ORAL_TABLET | ORAL | Status: AC
  Filled 2022-08-23: qty 4

## 2022-08-23 MED ORDER — DIPHENHYDRAMINE HCL 25 MG PO CAPS: 25.0000 mg | ORAL_CAPSULE | Freq: Four times a day (QID) | ORAL | Status: DC | PRN

## 2022-08-23 MED ORDER — HYDROXYZINE HCL 25 MG PO TABS: 50.0000 mg | ORAL_TABLET | Freq: Four times a day (QID) | ORAL | Status: DC | PRN

## 2022-08-23 MED ORDER — ONDANSETRON HCL 4 MG/2ML IJ SOLN: 4.0000 mg | INTRAMUSCULAR | Status: DC | PRN

## 2022-08-23 MED ORDER — OXYTOCIN 10 UNIT/ML IJ SOLN
INTRAMUSCULAR | Status: AC
  Filled 2022-08-23: qty 2

## 2022-08-23 MED ORDER — OXYTOCIN BOLUS FROM INFUSION: 333.0000 mL | Freq: Once | INTRAVENOUS | Status: DC

## 2022-08-23 MED ORDER — LIDOCAINE HCL (PF) 1 % IJ SOLN: 30.0000 mL | INTRAMUSCULAR | Status: DC | PRN

## 2022-08-23 MED ORDER — OXYTOCIN-SODIUM CHLORIDE 30-0.9 UT/500ML-% IV SOLN
1.0000 m[IU]/min | INTRAVENOUS | Status: DC
Start: 2022-08-23 — End: 2022-08-23

## 2022-08-23 MED ORDER — OXYTOCIN-SODIUM CHLORIDE 30-0.9 UT/500ML-% IV SOLN: 2.5000 [IU]/h | INTRAVENOUS | Status: DC

## 2022-08-23 MED ORDER — OXYTOCIN-SODIUM CHLORIDE 30-0.9 UT/500ML-% IV SOLN
INTRAVENOUS | Status: AC
  Administered 2022-08-23: 333 mL via INTRAVENOUS
  Filled 2022-08-23: qty 500

## 2022-08-23 MED ORDER — AMMONIA AROMATIC IN INHA
RESPIRATORY_TRACT | Status: AC
  Filled 2022-08-23: qty 10

## 2022-08-23 MED ORDER — IBUPROFEN 600 MG PO TABS
600.0000 mg | ORAL_TABLET | Freq: Four times a day (QID) | ORAL | Status: DC
  Administered 2022-08-23 – 2022-08-25 (×7): 600 mg via ORAL
  Filled 2022-08-23 (×6): qty 1

## 2022-08-23 MED ORDER — TERBUTALINE SULFATE 1 MG/ML IJ SOLN: 0.2500 mg | Freq: Once | INTRAMUSCULAR | Status: DC | PRN

## 2022-08-23 MED ORDER — LACTATED RINGERS IV SOLN
500.0000 mL | INTRAVENOUS | Status: DC | PRN
  Administered 2022-08-23: 1000 mL via INTRAVENOUS

## 2022-08-23 MED ORDER — ONDANSETRON HCL 4 MG/2ML IJ SOLN: 4.0000 mg | Freq: Four times a day (QID) | INTRAMUSCULAR | Status: DC | PRN

## 2022-08-23 MED ORDER — LACTATED RINGERS IV SOLN: 500.0000 mL | INTRAVENOUS | Status: DC | PRN

## 2022-08-23 MED ORDER — SOD CITRATE-CITRIC ACID 500-334 MG/5ML PO SOLN: 30.0000 mL | ORAL | Status: DC | PRN

## 2022-08-23 MED ORDER — FENTANYL-BUPIVACAINE-NACL 0.5-0.125-0.9 MG/250ML-% EP SOLN
EPIDURAL | Status: AC
  Filled 2022-08-23: qty 250

## 2022-08-23 MED ORDER — LACTATED RINGERS IV SOLN
500.0000 mL | Freq: Once | INTRAVENOUS | Status: DC
  Administered 2022-08-23: 500 mL via INTRAVENOUS

## 2022-08-23 MED ORDER — SIMETHICONE 80 MG PO CHEW
80.0000 mg | CHEWABLE_TABLET | ORAL | Status: DC | PRN
  Administered 2022-08-25: 80 mg via ORAL
  Filled 2022-08-23: qty 1

## 2022-08-23 MED ORDER — MISOPROSTOL 25 MCG QUARTER TABLET
25.0000 ug | ORAL_TABLET | ORAL | Status: DC
  Administered 2022-08-23: 25 ug via VAGINAL
  Filled 2022-08-23: qty 1

## 2022-08-23 MED ORDER — ONDANSETRON HCL 4 MG PO TABS: 4.0000 mg | ORAL_TABLET | ORAL | Status: DC | PRN

## 2022-08-23 MED ORDER — SENNOSIDES-DOCUSATE SODIUM 8.6-50 MG PO TABS
2.0000 | ORAL_TABLET | Freq: Every day | ORAL | Status: DC
  Administered 2022-08-24 – 2022-08-25 (×2): 2 via ORAL
  Filled 2022-08-23 (×2): qty 2

## 2022-08-23 MED ORDER — BENZOCAINE-MENTHOL 20-0.5 % EX AERO: 1.0000 | INHALATION_SPRAY | CUTANEOUS | Status: DC | PRN

## 2022-08-23 MED ORDER — LIDOCAINE HCL (PF) 1 % IJ SOLN
INTRAMUSCULAR | Status: DC | PRN
  Administered 2022-08-23: 1 mL via SUBCUTANEOUS

## 2022-08-23 MED ORDER — OXYTOCIN-SODIUM CHLORIDE 30-0.9 UT/500ML-% IV SOLN: 1.0000 m[IU]/min | INTRAVENOUS | Status: DC

## 2022-08-23 MED ORDER — LIDOCAINE-EPINEPHRINE (PF) 1.5 %-1:200000 IJ SOLN
INTRAMUSCULAR | Status: DC | PRN
  Administered 2022-08-23: 3 mL via EPIDURAL

## 2022-08-23 MED ORDER — LIDOCAINE HCL (PF) 1 % IJ SOLN
INTRAMUSCULAR | Status: AC
  Filled 2022-08-23: qty 30

## 2022-08-23 MED ORDER — FENTANYL-BUPIVACAINE-NACL 0.5-0.125-0.9 MG/250ML-% EP SOLN
12.0000 mL/h | EPIDURAL | Status: DC | PRN
  Administered 2022-08-23: 12 mL/h via EPIDURAL

## 2022-08-23 MED ORDER — DIBUCAINE (PERIANAL) 1 % EX OINT: 1.0000 | TOPICAL_OINTMENT | CUTANEOUS | Status: DC | PRN

## 2022-08-23 MED ORDER — DIPHENHYDRAMINE HCL 50 MG/ML IJ SOLN: 12.5000 mg | INTRAMUSCULAR | Status: DC | PRN

## 2022-08-23 MED ORDER — MISOPROSTOL 50MCG HALF TABLET
50.0000 ug | ORAL_TABLET | ORAL | Status: DC | PRN
Start: 2022-08-23 — End: 2022-08-23

## 2022-08-23 NOTE — Discharge Summary (Signed)
OB Discharge Summary     Patient Name: Tamara Mcclain DOB: 2000-06-28 MRN: [id number]  Date of admission: 08/23/2022 Delivering provider: Tresea Mall, CNM  Date of Delivery: 08/23/2022  Date of discharge: 08/25/2022  Admitting diagnosis: Chronic hypertension affecting pregnancy [O10.919] Intrauterine pregnancy: [redacted]w[redacted]d     Secondary diagnosis:  von Willebrand     Discharge diagnosis: Term Pregnancy Delivered, CHTN, and von Willebrand                                                                                                 Post partum procedures: NA  Augmentation: Cytotec and IP Foley  Complications: None  Hospital course:  Induction of Labor With Vaginal Delivery   22 y.o. yo G3P0020 at [redacted]w[redacted]d was admitted to the hospital 08/23/2022 for induction of labor.  Indication for induction:  cHTN .  Patient had a labor course complicated by FHR decelerations in second stage, vacuum assisted delivery Membrane Rupture Time/Date:  ,   Delivery Method:Vaginal, Vacuum (Extractor)  Episiotomy: None  Lacerations:  None  Details of delivery can be found in separate delivery note.    Patient had a postpartum course complicated by NA.  Ambulating and voiding without difficulty. Bleeding like a light menses. Appetite decreased, but this is her normal. Mood is good. She is very tired. Her partner will be home with her and her father lives nearby for [facility] support. Is breastfeeding and supplementing with formula, sound like infant is having some trouble with the latch. VWB panel drawn today . Pt desires another child "soon" does not plan to use contraception. Reviewed the importance the allowing the body time to recover and allowing at least a year to 18 months until the next pregnancy.   Patient is discharged home 08/25/22.  Newborn Data: Birth date:08/23/2022  Birth time:7:39 PM  Gender:Female   K[place] Living status:Living  Apgars:8 ,9  Weight:2580 g , 5 pounds 11 ounces  Physical exam   Vitals:   08/24/22 2327 08/25/22 0230 08/25/22 0412 08/25/22 0810  BP: 132/83 129/85 105/79 121/79  Pulse: (!) 101 85 66 76  Resp: 14 18 20 15   Temp: 98.9 F (37.2 C) 98.5 F (36.9 C) 98.5 F (36.9 C) 97.9 F (36.6 C)  TempSrc: Oral Oral Oral Oral  SpO2: 96% 100% 99% 98%   General: alert Breasts: soft, no redness or masses. Nipples erect and intact bilaterally  Lochia: appropriate Uterine Fundus: firm Incision: N/A Perineum: minimal swelling  DVT Evaluation: No evidence of DVT seen on physical exam. Negative Homan's sign. No edema  Labs: Lab Results  Component Value Date   WBC 18.7 (H) 08/24/2022   HGB 10.5 (L) 08/24/2022   HCT 32.4 (L) 08/24/2022   MCV 83.5 08/24/2022   PLT 262 08/24/2022    Discharge instruction: per After Visit Summary.  Medications:  Allergies as of 08/25/2022       Reactions   Tylenol [acetaminophen] Other (See Comments)   fever   Depo-medrol [methylprednisolone] Nausea Only   Feeling flushed        Medication List     STOP taking these  medications    ondansetron 4 MG tablet Commonly known as: Zofran       TAKE these medications    albuterol 108 (90 Base) MCG/ACT inhaler Commonly known as: VENTOLIN HFA Inhale 1-2 puffs into the lungs every 6 (six) hours as needed for wheezing or shortness of breath.   ibuprofen 600 MG tablet Commonly known as: ADVIL Take 1 tablet (600 mg total) by mouth every 6 (six) hours as needed.   Prenatal Vitamins 28-0.8 MG Tabs Take 1 tablet by mouth daily.               Discharge Care Instructions  (From admission, onward)           Start     Ordered   08/25/22 0000  Discharge wound care:       Comments: SHOWER DAILY Wash incision gently with soap and water.  Call office with any drainage, redness, or firmness of the incision.   08/25/22 8657            Diet: routine diet  Activity: Advance as tolerated. Pelvic rest for 6 weeks.   Outpatient follow up: Friday BP check   In person 2wk for WVB Panel  In person 6wk PP visit      Postpartum contraception: None "if it happens, it happens" Rhogam Given postpartum: Rh positive Rubella vaccine given postpartum: immune Varicella vaccine given postpartum: immune TDaP given antepartum or postpartum: given antepartum    Newborn Delivery   Birth date/time: 08/23/2022 19:39:00 Delivery type: Vaginal, Vacuum (Extractor)       Baby Feeding:  breast and formula  Disposition:home with mother  SIGNEDEllouise Newer [facility], CNM 08/25/2022 9:52 AM

## 2022-08-23 NOTE — Progress Notes (Signed)
  Labor Progress Note   22 y.o. B2W4132 @ [redacted]w[redacted]d , admitted for  Pregnancy, Labor Management. IOL/cHTN  Subjective:  Feeling strong cramping every few minutes  Objective:  BP 120/67   Pulse 70   Temp 98.1 F (36.7 C) (Oral)   LMP 11/13/2021 (Approximate)  Abd: gravid, ND, FHT present, mild tenderness on exam Extr: trace to 1+ bilateral pedal edema SVE: CERVIX: 2.5 cm dilated, 80 effaced, -2 station Cook's catheter placed  EFM: FHR: 135 bpm, variability: moderate,  accelerations:  Present,  decelerations:  Absent Toco: Frequency: Every 2-3 minutes Labs: I have reviewed the patient's lab results.   Assessment & Plan:  G4W1027 @ [redacted]w[redacted]d, admitted for  Pregnancy and Labor/Delivery Management  1. Pain management:  position changes . 2. FWB: FHT category I.  3. ID: GBS negative 4. Labor management: s/p cooks catheter placement  All discussed with patient, see orders   Tresea Mall, CNM  Ob/Gyn [facility] 08/23/2022  3:29 PM

## 2022-08-23 NOTE — Anesthesia Procedure Notes (Signed)
Epidural Patient location during procedure: OB Start time: 08/23/2022 6:26 PM End time: 08/23/2022 6:38 PM  Staffing Anesthesiologist: Foye Deer, MD Performed: anesthesiologist   Preanesthetic Checklist Completed: patient identified, IV checked, site marked, risks and benefits discussed, surgical consent, monitors and equipment checked, pre-op evaluation and timeout performed  Epidural Patient position: sitting Prep: ChloraPrep Patient monitoring: heart rate, continuous pulse ox and blood pressure Approach: midline Location: L3-L4 Injection technique: LOR saline  Needle:  Needle type: Tuohy  Needle gauge: 18 G Needle length: 9 cm Needle insertion depth: 6 cm Catheter type: closed end Catheter size: 20 Guage Catheter at skin depth: 11 cm Test dose: negative and 1.5% lidocaine with Epi 1:200 K  Assessment Events: blood not aspirated, no cerebrospinal fluid, injection not painful, no injection resistance and no paresthesia  Additional Notes Reason for block:procedure for pain

## 2022-08-23 NOTE — Anesthesia Preprocedure Evaluation (Signed)
Anesthesia Evaluation  Patient identified by MRN, date of birth, ID band Patient awake    Reviewed: Allergy & Precautions, H&P , NPO status , Patient's Chart, lab work & pertinent test results  Airway Mallampati: II  TM Distance: >3 FB Neck ROM: full    Dental no notable dental hx.    Pulmonary neg pulmonary ROS   Pulmonary exam normal        Cardiovascular Exercise Tolerance: Good negative cardio ROS Normal cardiovascular exam     Neuro/Psych    GI/Hepatic negative GI ROS,,,  Endo/Other    Renal/GU   negative genitourinary   Musculoskeletal   Abdominal   Peds  Hematology negative hematology ROS (+) Patient had been diagnosed with von willebrand disease years ago. She had heme workup in 2014 for heavy periods, normal blood work, but patient had genetic mutation that could have been a hypercoagulable state, but peds heme note says this mutation not associated with increased risk of clotting. No definitive diagnosis of Von willibrand disease made in those consult notes. She had a recent oncology visit in March 2024 with repeat labs not indicating VWD. She denies having bleeding episodes.   Anesthesia Other Findings Past Medical History: No date: Anxiety No date: Epiploic appendagitis 04/13/2022: Kidney stones     Comment:  Right Flank No date: MTHFR mutation Dr. Janee Morn at [facility]: Von Willebrand disease  Past Surgical History: 01/30/2020: ESOPHAGOGASTRODUODENOSCOPY (EGD) WITH PROPOFOL; N/A     Comment:  Procedure: ESOPHAGOGASTRODUODENOSCOPY (EGD) WITH               PROPOFOL;  Surgeon: Wyline Mood, MD;  Location: [facility]               ENDOSCOPY;  Service: Gastroenterology;  Laterality: N/A; 08/2018: WISDOM TOOTH EXTRACTION     Comment:  no bleeding complications     Reproductive/Obstetrics (+) Pregnancy                             Anesthesia Physical Anesthesia Plan  ASA: 2  Anesthesia  Plan: Epidural   Post-op Pain Management:    Induction:   PONV Risk Score and Plan:   Airway Management Planned:   Additional Equipment:   Intra-op Plan:   Post-operative Plan:   Informed Consent: I have reviewed the patients History and Physical, chart, labs and discussed the procedure including the risks, benefits and alternatives for the proposed anesthesia with the patient or authorized representative who has indicated his/her understanding and acceptance.       Plan Discussed with: Anesthesiologist and CRNA  Anesthesia Plan Comments:         Anesthesia Quick Evaluation

## 2022-08-23 NOTE — H&P (Signed)
OB History & Physical   History of Present Illness:  Chief Complaint: IOL/cHTN  HPI:  Tamara Mcclain is a 22 y.o. G69P0020 female at [redacted]w[redacted]d dated by 5 week ultrasound.  Her pregnancy has been complicated by Von Willebrand disease; Epiploic appendagitis; Panic attacks; Class 2 obesity due to excess calories with body mass index (BMI) of 37.0 to 37.9 in adult; Asthma; Chronic hypertension during pregnancy, antepartum; Family history of carrier of genetic disease; Back pain; Acute right flank pain; Supervision of high risk pregnancy, antepartum; Flank pain in pregnant patient; Obesity in pregnancy; carrier of genetic disease; Pelviectasis of kidney.    She denies contractions prior to induction.   She denies leakage of fluid.   She denies vaginal bleeding.   She reports fetal movement.    Total weight gain for pregnancy: -3.629 kg   Obstetrical Problem List:  Nursing Staff Provider  Office Location  [facility]  By 5w u/s  Language  English Anatomy US    Flu Vaccine   Genetic Screen  NIPS: negative/female  TDaP vaccine   06/23/22 Hgb A1C or  GTT Early : A1C 5.3 Third trimester : 98  Covid    LAB RESULTS   Rhogam   Blood Type --/--/A POS (04/14 0936)   Feeding Plan  Antibody NEG (04/14 0936)  Contraception  Rubella 1.35 (09/06 1413)  Circumcision  RPR Non Reactive (01/30 0926)   Pediatrician   HBsAg Negative (09/06 1413)   Support Person Husband Iantha Fallen HIV Non Reactive (01/30 0926)  Prenatal Classes  Varicella Immune     GBS Negative/-- (03/20 1456)(For PCN allergy, check sensitivities)   BTL Consent     VBAC Consent NA Pap  2022 negative    Hgb Electro    Pelvis Tested NA CF      SMA           Maternal Medical History:   Past Medical History:  Diagnosis Date   Anxiety    Epiploic appendagitis    Kidney stones 04/13/2022   Right Flank   MTHFR mutation    Von Willebrand disease Dr. Janee Morn at [facility]    Past Surgical History:  Procedure Laterality Date    ESOPHAGOGASTRODUODENOSCOPY (EGD) WITH PROPOFOL N/A 01/30/2020   Procedure: ESOPHAGOGASTRODUODENOSCOPY (EGD) WITH PROPOFOL;  Surgeon: Wyline Mood, MD;  Location: [facility] ENDOSCOPY;  Service: Gastroenterology;  Laterality: N/A;   WISDOM TOOTH EXTRACTION  08/2018   no bleeding complications    Allergies  Allergen Reactions   Tylenol [Acetaminophen] Other (See Comments)    fever   Depo-Medrol [Methylprednisolone] Nausea Only    Feeling flushed    Prior to Admission medications   Medication Sig Start Date End Date Taking? Authorizing Provider  Prenatal Vit-Fe Fumarate-FA (PRENATAL VITAMINS) 28-0.8 MG TABS Take 1 tablet by mouth daily.   Yes [provider]  albuterol (VENTOLIN HFA) 108 (90 Base) MCG/ACT inhaler Inhale 1-2 puffs into the lungs every 6 (six) hours as needed for wheezing or shortness of breath. 04/28/22   Lorre Munroe, NP  ondansetron (ZOFRAN) 4 MG tablet Take 1 tablet (4 mg total) by mouth every 8 (eight) hours as needed for nausea or vomiting. Patient not taking: Reported on 08/23/2022 03/10/22   Glenetta Borg, CNM    OB History  Gravida Para Term Preterm AB Living  3       2    SAB IAB Ectopic Multiple Live Births  2            #  Outcome Date GA Lbr Len/2nd Weight Sex Delivery Anes PTL Lv  3 Current           2 SAB 01/29/21          1 SAB 06/27/20            Prenatal care site: [facility]  Social History: She  reports that she has never smoked. She has been exposed to tobacco smoke. She has never used smokeless tobacco. She reports that she does not drink alcohol and does not use drugs.  Family History: family history includes Arthritis in her maternal grandmother; CAD in her maternal grandfather; Cancer in her paternal grandmother; Cancer (age of onset: 38) in her maternal grandmother; Colon polyps in her paternal grandmother; Gaucher's disease in her half-brother; Heart disease in her paternal grandfather; Hyperlipidemia in her maternal grandmother;  Hypertension in her father; Pulmonary embolism in her father.    Review of Systems:  Review of Systems  Constitutional:  Negative for chills and fever.  HENT:  Negative for congestion, ear discharge, ear pain, hearing loss, sinus pain and sore throat.   Eyes:  Negative for blurred vision and double vision.  Respiratory:  Negative for cough, shortness of breath and wheezing.   Cardiovascular:  Negative for chest pain, palpitations and leg swelling.  Gastrointestinal:  Positive for abdominal pain. Negative for blood in stool, constipation, diarrhea, heartburn, melena, nausea and vomiting.  Genitourinary:  Negative for dysuria, flank pain, frequency, hematuria and urgency.  Musculoskeletal:  Negative for back pain, joint pain and myalgias.  Skin:  Negative for itching and rash.  Neurological:  Negative for dizziness, tingling, tremors, sensory change, speech change, focal weakness, seizures, loss of consciousness, weakness and headaches.  Endo/Heme/Allergies:  Negative for environmental allergies. Does not bruise/bleed easily.  Psychiatric/Behavioral:  Negative for depression, hallucinations, memory loss, substance abuse and suicidal ideas. The patient is not nervous/anxious and does not have insomnia.      Physical Exam:  BP (!) 140/96   Pulse 95   Temp 98 F (36.7 C) (Oral)   LMP 11/13/2021 (Approximate)   Constitutional: Well nourished, well developed female in no acute distress.  HEENT: normal Skin: Warm and dry.  Cardiovascular: Regular rate and rhythm.   Extremity:  trace edema   Respiratory: Clear to auscultation bilateral. Normal respiratory effort Abdomen: FHT present Back: no CVAT Psych: Alert and Oriented x3. No memory deficits. Normal mood and affect.    Pelvic exam: per RN H. Black 1.5/50/-3   Baseline FHR: 140 beats/min   Variability: moderate   Accelerations: present   Decelerations: absent Contractions: present frequency: every 3-6 Overall assessment:  reassuring    Lab Results  Component Value Date   SARSCOV2NAA NOT DETECTED 02/10/2022    Assessment:  Tamara Mcclain is a 22 y.o. G57P0020 female at [redacted]w[redacted]d with IOL cHTN.   Plan:  Admit to Labor & Delivery  CBC, T&S, Clrs, IVF GBS negative.   Fetal well-being: Category I Begin induction with cytotec cHTN: monitor for s/s elevated BP    Tresea Mall, CNM 08/23/2022 12:08 PM

## 2022-08-23 NOTE — Progress Notes (Signed)
Vacuum was applied by Higinio Roger, CNM at [phone]. There were x 2 pulls and no pop-offs with delivery of baby at 1939.

## 2022-08-24 ENCOUNTER — Encounter: Payer: Self-pay | Admitting: Obstetrics & Gynecology

## 2022-08-24 LAB — CBC
HCT: 32.4 % — ABNORMAL LOW (ref 36.0–46.0)
Hemoglobin: 10.5 g/dL — ABNORMAL LOW (ref 12.0–15.0)
MCH: 27.1 pg (ref 26.0–34.0)
MCHC: 32.4 g/dL (ref 30.0–36.0)
MCV: 83.5 fL (ref 80.0–100.0)
Platelets: 262 10*3/uL (ref 150–400)
RBC: 3.88 MIL/uL (ref 3.87–5.11)
RDW: 14.6 % (ref 11.5–15.5)
WBC: 18.7 10*3/uL — ABNORMAL HIGH (ref 4.0–10.5)
nRBC: 0 % (ref 0.0–0.2)

## 2022-08-24 LAB — RPR: RPR Ser Ql: NONREACTIVE

## 2022-08-24 NOTE — Anesthesia Postprocedure Evaluation (Signed)
Anesthesia Post Note  Patient: Tamara Mcclain  Procedure(s) Performed: AN AD HOC LABOR EPIDURAL  Patient location during evaluation: Mother Baby Anesthesia Type: Epidural Level of consciousness: awake and alert Pain management: pain level controlled Vital Signs Assessment: post-procedure vital signs reviewed and stable Respiratory status: spontaneous breathing, nonlabored ventilation and respiratory function stable Cardiovascular status: stable Postop Assessment: no headache, no backache and epidural receding Anesthetic complications: no   No notable events documented.   Last Vitals:  Vitals:   08/24/22 0603 08/24/22 0741  BP: 125/79 109/81  Pulse: 74 79  Resp: 18 18  Temp: 36.8 C 36.9 C  SpO2: 98% 99%    Last Pain:  Vitals:   08/24/22 0741  TempSrc: Oral  PainSc:                  Jeanine Luz

## 2022-08-24 NOTE — Progress Notes (Signed)
Post Partum Day 1 Subjective: no complaints, up ad lib, voiding, and tolerating PO  Objective: Blood pressure 109/81, pulse 79, temperature 98.4 F (36.9 C), temperature source Oral, resp. rate 18, last menstrual period 11/13/2021, SpO2 99 %, unknown if currently breastfeeding.  Physical Exam:  General: cooperative, fatigued, and no distress Lochia: appropriate Uterine Fundus: firm Incision: perineum is intact, slightly edfematous, ice pack in place. DVT Evaluation: No evidence of DVT seen on physical exam. Negative Homan's sign.  Recent Labs    08/23/22 0931 08/24/22 0649  HGB 11.8* 10.5*  HCT 35.4* 32.4*    Assessment/Plan: Plan for discharge tomorrow, Breastfeeding, and Lactation consult   LOS: 1 day   Mirna Mires, CNM 08/24/2022, 10:21 AM

## 2022-08-24 NOTE — Lactation Note (Signed)
This note was copied from a baby's chart. Lactation Consultation Note  Patient Name: Boy Marylea Roblyer IOEVO'J Date: 08/24/2022 Age:23 hours Reason for consult: RN request;Follow-up assessment  LC back in room to observe feeding.  Maternal Data Has patient been taught Hand Expression?: Yes Does the patient have breastfeeding experience prior to this delivery?: No  Feeding Mother's Current Feeding Choice: Breast Milk and Formula  Baby latched easily with minimal assistance. Mom independent with positioning, sandwiching of breast tissue and rubbing nipple up and down; displayed patience in working to get the baby latched. Nipples are everted nicely and remain everted. Compressible breast tissue.  LATCH Score Latch: Grasps breast easily, tongue down, lips flanged, rhythmical sucking.  Audible Swallowing: A few with stimulation  Type of Nipple: Everted at rest and after stimulation  Comfort (Breast/Nipple): Soft / non-tender  Hold (Positioning): No assistance needed to correctly position infant at breast.  LATCH Score: 9   Lactation Tools Discussed/Used Tools:  (discussed post pumping if baby is supplementing; mom tired at this time)  Interventions Interventions: Breast feeding basics reviewed;Hand express;Support pillows;Education  Discharge    Consult Status Consult Status: Follow-up    Danford Bad 08/24/2022, 11:10 AM

## 2022-08-24 NOTE — Plan of Care (Signed)
Infant is PP and transferred to Room 337. Oriented to Room, Safety and Security, Fall Prevention and POC. Pt. V/O.

## 2022-08-24 NOTE — Lactation Note (Signed)
This note was copied from a baby's chart. Lactation Consultation Note  Patient Name: Tamara McclainT Date: 08/24/2022 Age:23 hours Reason for consult: Initial assessment;Primapara;Term;Infant < 6lbs   Maternal Data Has patient been taught Hand Expression?: Yes Does the patient have breastfeeding experience prior to this delivery?: No  P1, SVD 12 hours ago. Mom with hx of Von Willebrand disease, MTHFR mutation, anxiety, and kidney stones. She desires to breast and bottle feed.  Baby born at [redacted]w[redacted]d weighing less than 6lbs. Hypoglycemia protocol was initiated and initial sugar was 110, with a big drop with second sugar of 45. Parents were encouraged to continue with breastfeeding + post feed formula supplementation of 10-52mL to protect baby's sugar.  Feeding Mother's Current Feeding Choice: Breast Milk and Formula  Mom plans this morning to continue with current feeding plan started last night to ensure that baby continues to feed well.  We discussed the importance of always offering the breast first to ensure adequate breast stimulation and milk removal to promote onset of adequate milk production. We also talked about the option of pumping post BF for additional stimulation while mom is providing supplement.  LATCH Score   Lactation Tools Discussed/Used Tools:  (discussed post pumping if baby is supplementing; mom tired at this time)  Interventions Interventions: Breast feeding basics reviewed;Hand express;DEBP;Education (Newborn feeding patterns/behaviors, early cues, signs of good/shallow latch, signs of transfer, offering breast before formula, pump use to help build supply)  Discharge    Consult Status Consult Status: Follow-up  Whiteboard updated.  Danford Bad 08/24/2022, 9:52 AM

## 2022-08-25 MED ORDER — IBUPROFEN 600 MG PO TABS: 600.0000 mg | ORAL_TABLET | Freq: Four times a day (QID) | ORAL | 0 refills | Status: DC | PRN

## 2022-08-25 NOTE — Progress Notes (Signed)
Patient discharged home with family.  Discharge instructions, when to follow up, and prescriptions reviewed with patient.  Patient verbalized understanding. Patient will be escorted out by auxiliary.   

## 2022-08-25 NOTE — Progress Notes (Signed)
Obstetric Postpartum Daily Progress Note Subjective:  22 y.o. [id number] postpartum day #2 status post vaginal delivery.  She is ambulating, is tolerating po-but has a decreased appetite, which is not new for her, is voiding spontaneously.  Her pain is well controlled on PO pain medications. Her lochia is less than menses. She is breastfeeding and supplementing with formula.    Medications SCHEDULED MEDICATIONS   ibuprofen  600 mg Oral Q6H   prenatal multivitamin  1 tablet Oral Q1200   senna-docusate  2 tablet Oral Daily    MEDICATION INFUSIONS    PRN MEDICATIONS  benzocaine-Menthol, coconut oil, witch hazel-glycerin **AND** dibucaine, diphenhydrAMINE, ondansetron **OR** ondansetron (ZOFRAN) IV, simethicone    Objective:   Vitals:   08/24/22 2327 08/25/22 0230 08/25/22 0412 08/25/22 0810  BP: 132/83 129/85 105/79 121/79  Pulse: (!) 101 85 66 76  Resp: Temp: 98.9 F (37.2 C) 98.5 F (36.9 C) 98.5 F (36.9 C) 97.9 F (36.6 C)  TempSrc: Oral Oral Oral Oral  SpO2: 96% 100% 99% 98%    Current Vital Signs 24h Vital Sign Ranges  T 97.9 F (36.6 C) Temp  Avg: 98.3 F (36.8 C)  Min: 97.9 F (36.6 C)  Max: 98.9 F (37.2 C)  BP 121/79 BP  Min: 105/79  Max: 136/89  HR 76 Pulse  Avg: 78.1  Min: 65  Max: 101  RR 15 Resp  Avg: 17.3  Min: 14  Max: 20  SaO2 98 % Room Air SpO2  Avg: 98 %  Min: 96 %  Max: 100 %       24 Hour I/O Current Shift I/O  Time Ins Outs 04/15 0701 - 04/16 0700 In: 1527.4 [I.V.:1277.4] Out: -  No intake/output data recorded.  General: NAD Pulmonary: no increased work of breathing Breasts: soft, no redness or masses. Nipples erect and intact bilaterally.  Abdomen: non-distended, non-tender, fundus firm at level of umbilicus Extremities: no edema, no erythema, no tenderness  Labs:  Recent Labs  Lab 08/23/22 0931 08/24/22 0649  WBC 13.5* 18.7*  HGB 11.8* 10.5*  HCT 35.4* 32.4*  PLT 313 262     Assessment:   22 y.o. G3P1021 postpartum day  # 2 status post vacuum assist birth   Plan:   1) Acute blood loss anemia - hemodynamically stable and asymptomatic - po ferrous sulfate  2) A POS / Rubella 1.35 (09/06 1413)/ Varicella Immune  3) TDAP status 06/23/2022  4) breast and bottle /Contraception = no method  5) Disposition discharge later today   Ellouise [facility], CNM  08/25/2022 9:39 AM

## 2022-08-25 NOTE — Lactation Note (Signed)
This note was copied from a baby's chart. Lactation Consultation Note  Patient Name: Tamara Mcclain [id number] Date: 08/25/2022 Age:23 hours Reason for consult: Follow-up assessment;Primapara;1st time breastfeeding;Term  Lactation follow-up prior to anticipated discharge later today.  Maternal Data Has patient been taught Hand Expression?: Yes Does the patient have breastfeeding experience prior to this delivery?: No  Mom reports feeling well, continuing to breast and bottle feed without overall concerns.  Feeding Mother's Current Feeding Choice: Breast Milk and Formula Nipple Type: Slow - flow  Due to baby's size and early blood sugars recommendation was given to BF and then supplement with formula. Baby has followed this pattern and has been doing well. Void/stools exceed minimum expectations, passed 24hr screens, no concerns.  Baby was lightly cuing and LC suggested attempted feed.  Baby brought into football hold without difficulty. Mom's nipples everted easily with stimulation; compressible tissue. Baby did latch easily and had brief period of rhythmic suck pattern with 1-2 swallows and then fell asleep quickly. With baby at the breast we reviewed position/latch and alignment of baby. Guidance given for looking at wide mouth and flange lips (which baby had displayed at this time).  Assisted with moving baby up on mom's chest skin to skin.  LATCH Score Latch: Grasps breast easily, tongue down, lips flanged, rhythmical sucking.  Audible Swallowing: A few with stimulation  Type of Nipple: Everted at rest and after stimulation  Comfort (Breast/Nipple): Soft / non-tender  Hold (Positioning): Assistance needed to correctly position infant at breast and maintain latch.  LATCH Score: 8   Lactation Tools Discussed/Used    Interventions Interventions: Breast feeding basics reviewed;Assisted with latch;Skin to skin;Breast massage;Hand express;Breast compression;Adjust  position;Support pillows;Position options;Education;Ice;Pace feeding  Reviewed BF basics for next 1-2 weeks, normalcy of cluster feeding and growth spurts, hand expression and position/alignment. Encouraged to keep baby awake/alert at the breast while feeding for optimal transfer.  Brief guidance given for pace bottle feeding to help minimize overfeeding and/or spit-up.  Discharge Discharge Education: Engorgement and breast care;Warning signs for feeding baby;Outpatient recommendation Pump: Personal;Manual  Guidance given for anticipated breast changes and management of breast fullness and engorgement and nipple care.  Consult Status Consult Status: Complete  Outpatient lactation number provided; encouraged mom to call with questions/concerns and ongoing BF support as needed.  Danford Bad 08/25/2022, 10:28 AM

## 2022-08-25 NOTE — Progress Notes (Signed)
Patient had lightheaded episode on toilet. Transferred back to bed, no loss of consciousness. Bleeding WNL. Vitals assessed and WNL. Patient complained of mild headache but no blurry vision, BP WNL. When back in bed, patient reported feeling less lightheaded. Reports she fell asleep with heating pad and felt really hot/flushed. Cool washcloth to forehead and light sheet. Simethicone given for presumed gas pain on left lower abdomen.

## 2022-08-25 NOTE — Discharge Instructions (Signed)
Discharge instructions:   Call office if you have any of the following: headache, visual changes, fever >101 F, chills, breast concerns, excessive vaginal bleeding, leg pain or redness, depression or any other concerns.   Activity: Do not lift > 20 lbs for 6 weeks.  No intercourse or tampons for 6 weeks.  No driving for 1-2 weeks.   Call your doctor for increased pain or vaginal bleeding, temperature above 101.0, depression, or concerns.  No strenuous activity or heavy lifting for 6 weeks.  No intercourse, tampons, douching, or enemas for 6 weeks.  Tub baths and showers are ok.  No driving for 2 weeks or while taking pain medications.  Continue prenatal vitamin and iron.  Increase calories and fluids while breastfeeding.  For concerns about your baby please call the pediatrician For breastfeeding issues please remember to call our lactation consultants. 

## 2022-08-26 ENCOUNTER — Encounter: Payer: BC Managed Care – PPO | Admitting: Obstetrics

## 2022-08-26 ENCOUNTER — Other Ambulatory Visit: Payer: BC Managed Care – PPO

## 2022-08-27 LAB — VON WILLEBRAND PANEL
Coagulation Factor VIII: 223 % — ABNORMAL HIGH (ref 56–140)
Ristocetin Co-factor, Plasma: 224 % — ABNORMAL HIGH (ref 50–200)
Von Willebrand Antigen, Plasma: 346 % — ABNORMAL HIGH (ref 50–200)

## 2022-08-27 LAB — COAG STUDIES INTERP REPORT

## 2022-08-27 NOTE — Progress Notes (Signed)
These labs  were supposed to be drawn postpartum at her 6 week appointment (or later).  It was too soon to draw them after pregnancy as this could give false elevations.

## 2022-08-28 ENCOUNTER — Ambulatory Visit (INDEPENDENT_AMBULATORY_CARE_PROVIDER_SITE_OTHER): Payer: BC Managed Care – PPO

## 2022-08-28 VITALS — BP 120/84 | Ht 60.0 in | Wt 168.0 lb

## 2022-08-28 DIAGNOSIS — Z0130 Encounter for examination of blood pressure without abnormal findings: Secondary | ICD-10-CM

## 2022-08-28 NOTE — Progress Notes (Signed)
    NURSE VISIT NOTE  Subjective:    Patient ID: Tamara Mcclain, female    DOB: Sep 22, 2000, 22 y.o.   MRN: [id number]  HPI  Patient is a 22 y.o. G18P1021 female who presents for BP check per order from Carie Caddy, [place].   Patient reports compliance with prescribed BP medications: not takin BP meds.  Last dose of BP medication: not on BP meds.  BP Readings from Last 3 Encounters:  08/28/22 120/84  08/25/22 119/78  08/21/22 (!) 133/96   Pulse Readings from Last 3 Encounters:  08/25/22 70  08/21/22 99  08/21/22 99    Objective:    BP 120/84   Ht 5' (1.524 m)   Wt 168 lb (76.2 kg)   Breastfeeding No   BMI 32.81 kg/m   Assessment:   1. Blood pressure check      Plan:   Return to clinic as scheduled. Return to clinic PRN  Patient verbalized understanding of instructions.   Donnetta Hail, CMA

## 2022-08-28 NOTE — Patient Instructions (Signed)
Postpartum Hypertension Postpartum hypertension is blood pressure that is higher than normal after childbirth. It usually starts within 1 to 2 days after delivery, but it can happen at any time for up to 12 weeks after delivery. For some women, medical treatment is required to prevent serious complications, such as seizures or stroke. What are the causes? The cause of this condition is not well understood. In some cases, the cause may not be known. Certain conditions may increase your risk. These include: Hypertension that existed before pregnancy (chronic hypertension). Hypertension that happens as a result of pregnancy (gestational hypertension). Hypertensive disorders during pregnancy or seizures in women who have high blood pressure during pregnancy. These conditions are called preeclampsia and eclampsia. A condition in which the liver, platelets, and red blood cells are damaged during pregnancy (HELLP syndrome). Obesity. Diabetes. What are the signs or symptoms? Signs and symptoms may include: Headaches. These may be mild, moderate, or severe. They may also be steady, constant, or sudden (thunderclap headache). Vision changes, such as blurry vision, flashing lights, or seeing spots. Nausea and vomiting. Pain in the upper right side of your abdomen. Shortness of breath or trouble breathing. Swelling in your face or hands. A decrease in the amount of urine that you pass. You may not have any signs or symptoms. How is this diagnosed? This condition may be diagnosed based on the results of a physical exam, blood pressure measurements, and blood and urine tests. You may also have other tests, such as a CT scan or an MRI, to check for other problems. How is this treated? If blood pressure is high enough to require treatment, your options may include: Medicines to reduce blood pressure (antihypertensives). Tell your health care provider if you are breastfeeding or if you plan to breastfeed.  There are many antihypertensive medicines that are safe to take while breastfeeding. Treating medical conditions that are causing hypertension. Treating the complications of hypertension, such as seizures, stroke, or kidney problems. Your health care provider will also continue to monitor your blood pressure closely until it is within a safe range for you. Follow these instructions at home: Learn your goal blood pressure Two numbers make up your blood pressure. The first number is called systolic pressure. The second is called diastolic pressure. An example of a blood pressure reading is "120 over 80" (or 120/80). Ask your health care provider what your goal blood pressure is. Know how to take your blood pressure To check your blood pressure, follow the instructions in the manual that came with your blood pressure monitor. This includes any instructions on what to do before taking your blood pressure. Record your blood pressure readings Follow your health care provider's instructions on how to record your blood pressure readings. Your health care provider may ask you to: Get one reading in the morning (a.m.) before you take any medicines. Get one reading in the evening (p.m.) before supper. Take at least 2 readings with each blood pressure check. This makes sure the results are correct. Wait 1-2 minutes between measurements.  General instructions Take over-the-counter and prescription medicines only as told by your health care provider. Do not use any products that contain nicotine or tobacco. These products include cigarettes, chewing tobacco, and vaping devices, such as e-cigarettes. If you need help quitting, ask your health care provider. Check your blood pressure as often as told by your health care provider. Return to your normal activities as told by your health care provider. Ask your health care provider   what activities are safe for you. Keep all follow-up visits. Your health care  provider will continue to monitor your blood pressure closely until it is in a safe range for you. Contact a health care provider if: You have new symptoms, such as: A headache that does not get better. Dizziness. Vision changes. Nausea and vomiting. Get help right away if: You have trouble breathing. You have chest pain. You faint. You have any symptoms of a stroke. "BE FAST" is an easy way to remember the main warning signs of a stroke: B - Balance. Signs are dizziness, sudden trouble walking, or loss of balance. E - Eyes. Signs are trouble seeing or a sudden change in vision. F - Face. Signs are sudden weakness or numbness of the face, or the face or eyelid drooping on one side. A - Arms. Signs are weakness or numbness in an arm. This happens suddenly and usually on one side of the body. S - Speech. Signs are sudden trouble speaking, slurred speech, or trouble understanding what people say. T - Time. Time to call emergency services. Write down what time symptoms started. You have other signs of a stroke, such as: A sudden, severe headache with no known cause. Nausea or vomiting. Seizure. These symptoms may be an emergency. Get help right away. Call 911. Do not wait to see if the symptoms will go away. Do not drive yourself to the hospital. Summary Postpartum hypertension is blood pressure that is higher than normal after childbirth. For some women, medical treatment is required to prevent serious complications, such as seizures or stroke. Follow your health care provider's instructions on how to record your blood pressure readings. Keep all follow-up visits. Your health care provider will continue to monitor your blood pressure closely until it is within a safe range for you. This information is not intended to replace advice given to you by your health care provider. Make sure you discuss any questions you have with your health care provider. Document Revised: 07/29/2021 Document  Reviewed: 07/29/2021 Elsevier Patient Education  2023 Elsevier Inc.  

## 2022-08-31 NOTE — Progress Notes (Signed)
We can refer back to Hematology if her labs are still elvated at 6 weeks.

## 2022-09-11 ENCOUNTER — Ambulatory Visit (INDEPENDENT_AMBULATORY_CARE_PROVIDER_SITE_OTHER): Payer: BC Managed Care – PPO | Admitting: Licensed Practical Nurse

## 2022-09-11 ENCOUNTER — Encounter: Payer: Self-pay | Admitting: Licensed Practical Nurse

## 2022-09-11 DIAGNOSIS — D6800 Von Willebrand disease, unspecified: Secondary | ICD-10-CM

## 2022-09-11 DIAGNOSIS — Z1332 Encounter for screening for maternal depression: Secondary | ICD-10-CM

## 2022-09-11 NOTE — Progress Notes (Signed)
Postpartum Visit  Chief Complaint:  Chief Complaint  Patient presents with   Postpartum Care    2 WEEK PP    History of Present Illness: Patient is a 22 y.o. [id number] presents for postpartum visit.  Date of delivery: 08/23/2022 Type of delivery: Vaginal delivery - Vacuum or forceps assisted  yes Episiotomy No.  Laceration: no  Pregnancy or labor problems:  CHTN Any problems since the delivery:  Pain in Left groin, worse when changing position or walking, feels better when at rest. Feels like a tight muscle, improves with Motrin, pain not as bad today compared to last week, She did have back pain the first week.  -Bleeding, light amount, brown in color -Sleep: sleeps when baby sleep, gets 2-3 hour stretches, 6-7 hours in 24hours -Appetite: is decreased, has to force her self to eat,  -No concerns with voiding, stooling or vagina -Mood: overall is ok, has had some ups and downs adjusting to the newborn, feels that she now has a routine, her partner is involved when he is at home, her father visit on the weekends.  -Would like another child, hopes for "closely spaced" pregnancies,  does not desire hormonal contraception, may consider condoms    Newborn Details:  SINGLETON :  1. Baby's name: Warden Fillers. Birth weight: 5lbs 11oz Maternal Details:  Breast Feeding:  no Post partum depression/anxiety noted:  no Edinburgh Post-Partum Depression Score:  2  Date of last PAP: 08/2020  normal   Past Medical History:  Diagnosis Date   Anxiety    Epiploic appendagitis    Kidney stones 04/13/2022   Right Flank   MTHFR mutation    Von Willebrand disease (HCC) Dr. Janee Morn at [facility]    Past Surgical History:  Procedure Laterality Date   ESOPHAGOGASTRODUODENOSCOPY (EGD) WITH PROPOFOL N/A 01/30/2020   Procedure: ESOPHAGOGASTRODUODENOSCOPY (EGD) WITH PROPOFOL;  Surgeon: Wyline Mood, MD;  Location: [facility] ENDOSCOPY;  Service: Gastroenterology;  Laterality: N/A;   WISDOM TOOTH EXTRACTION  08/2018   no  bleeding complications    Prior to Admission medications   Medication Sig Start Date End Date Taking? Authorizing Provider  albuterol (VENTOLIN HFA) 108 (90 Base) MCG/ACT inhaler Inhale 1-2 puffs into the lungs every 6 (six) hours as needed for wheezing or shortness of breath. 04/28/22  Yes Lorre Munroe, NP  ibuprofen (ADVIL) 600 MG tablet Take 1 tablet (600 mg total) by mouth every 6 (six) hours as needed. 08/25/22  Yes Hyatt Capobianco, Courtney Heys, CNM    Allergies  Allergen Reactions   Tylenol [Acetaminophen] Other (See Comments)    fever   Depo-Medrol [Methylprednisolone] Nausea Only    Feeling flushed     Social History   Socioeconomic History   Marital status: Married    Spouse name: Messiah Buccieri.   Number of children: 0   Years of education: 12   Highest education level: Not on file  Occupational History   Occupation: Lot Production designer, theatre/television/film    Comment: Recruitment consultant [employer]  Tobacco Use   Smoking status: Never    Passive exposure: Past   Smokeless tobacco: Never  Vaping Use   Vaping Use: Never used  Substance and Sexual Activity   Alcohol use: No    Alcohol/week: 0.0 standard drinks of alcohol   Drug use: No   Sexual activity: Not Currently    Partners: Male    Birth control/protection: None  Other Topics Concern   Not on file  Social History Narrative   Not on file  Social Determinants of Health   Financial Resource Strain: Low Risk  (12/31/2021)   Overall Financial Resource Strain (CARDIA)    Difficulty of Paying Living Expenses: Not hard at all  Food Insecurity: No Food Insecurity (08/23/2022)   Hunger Vital Sign    Worried About Running Out of Food in the Last Year: Never true    Ran Out of Food in the Last Year: Never true  Transportation Needs: No Transportation Needs (08/23/2022)   PRAPARE - Administrator, Civil Service (Medical): No    Lack of Transportation (Non-Medical): No  Physical Activity: Sufficiently Active (12/31/2021)   Exercise  Vital Sign    Days of Exercise per Week: 4 days    Minutes of Exercise per Session: 150+ min  Stress: No Stress Concern Present (12/31/2021)   [employer] of Occupational Health - Occupational Stress Questionnaire    Feeling of Stress : Not at all  Social Connections: Moderately Integrated (12/31/2021)   Social Connection and Isolation Panel [NHANES]    Frequency of Communication with Friends and Family: More than three times a week    Frequency of Social Gatherings with Friends and Family: Three times a week    Attends Religious Services: More than 4 times per year    Active Member of Clubs or Organizations: No    Attends Banker Meetings: Never    Marital Status: Living with partner  Intimate Partner Violence: Not At Risk (08/23/2022)   Humiliation, Afraid, Rape, and Kick questionnaire    Fear of Current or Ex-Partner: No    Emotionally Abused: No    Physically Abused: No    Sexually Abused: No    Family History  Problem Relation Age of Onset   Hypertension Father    Pulmonary embolism Father    Cancer Maternal Grandmother 40       breast   Arthritis Maternal Grandmother    Hyperlipidemia Maternal Grandmother    CAD Maternal Grandfather    Cancer Paternal Grandmother        melanoma, sinus cancer   Colon polyps Paternal Grandmother    Heart disease Paternal Grandfather    Gaucher's disease Half-Brother    Esophageal cancer Neg Hx    Stomach cancer Neg Hx    Liver disease Neg Hx     ROS see HPI   Physical Exam BP (!) 129/91   Pulse 85   Wt 167 lb 4.8 oz (75.9 kg)   LMP 11/13/2021 (Approximate)   Breastfeeding No   BMI 32.67 kg/m   Physical Exam Constitutional:      Appearance: Normal appearance.  Genitourinary:     Genitourinary Comments: Left groin: No tenderness with palpation over, no bulges or masses. Some mild skin irritation form depends present   Abdominal:     General: There is no distension.     Tenderness: There is no abdominal  tenderness.     Comments: Uterus not palpated in abdomen   Musculoskeletal:     Cervical back: Normal range of motion.     Right lower leg: No edema.     Left lower leg: No edema.  Neurological:     General: No focal deficit present.     Mental Status: She is alert.  Skin:    General: Skin is warm.  Psychiatric:        Mood and Affect: Mood normal.      Assessment: 22 y.o. Z6X0960 presenting for 2 week postpartum visit  Plan: Problem  List Items Addressed This Visit       Hematopoietic and Hemostatic   Von Willebrand disease (HCC) - Primary (Chronic)   Relevant Orders   Von Willebrand panel   Other Visit Diagnoses     2 weeks postpartum follow-up       Relevant Orders   Von Willebrand panel        1) Contraception Education given regarding options for contraception, including barrier methods.  2)  Pap - ASCCP guidelines and rational discussed.  Patient opts for 3year screening interval  3) Patient underwent screening for postpartum depression with NO concerns noted.  4) Follow up 4 wks for PP exam   5) Elevated BP, reviewed with Dr Valentino Saxon. Will reevaluate at 6 wk visit.   6) Hx VWD: VWD panel collected per Dr Janyth Contes, see note 08/13/22  7) Pain in left groin, comfort measure reviewed, consider PT if not improved by 6wk visit   Carie Caddy, CNM  [facility]  09/11/22  12:05 PM

## 2022-09-14 LAB — VON WILLEBRAND PANEL
Factor VIII Activity: 112 % (ref 56–140)
Von Willebrand Ag: 167 % (ref 50–200)
Von Willebrand Factor: 93 % (ref 50–200)

## 2022-09-14 LAB — COAG STUDIES INTERP REPORT

## 2022-09-16 ENCOUNTER — Telehealth: Payer: Self-pay

## 2022-10-16 ENCOUNTER — Ambulatory Visit (INDEPENDENT_AMBULATORY_CARE_PROVIDER_SITE_OTHER): Payer: BC Managed Care – PPO | Admitting: Licensed Practical Nurse

## 2022-10-16 ENCOUNTER — Encounter: Payer: Self-pay | Admitting: Licensed Practical Nurse

## 2022-10-16 DIAGNOSIS — Z1332 Encounter for screening for maternal depression: Secondary | ICD-10-CM

## 2022-10-16 DIAGNOSIS — I10 Essential (primary) hypertension: Secondary | ICD-10-CM

## 2022-10-16 NOTE — Progress Notes (Signed)
Postpartum Visit  Chief Complaint:  Chief Complaint  Patient presents with   Postpartum Care    6 WEEK PP    History of Present Illness: Patient is a 22 y.o. Tamara Mcclain presents for postpartum visit.  Date of delivery: 08/23/2022 Type of delivery: Vaginal delivery - Vacuum or forceps assisted  yes Episiotomy No.  Laceration: no  Pregnancy or labor problems:  CHTN. High BMI, anxiety,  Any problems since the delivery:  has an ache at the epidural site, uses a heating pad -Sleep is good, infant sleeps through the night -No concerns with voiding, stooling, or vagina -Bleeding has stopped, is certain she had a cycle 2 weeks ago -Has not had IC, is open to another pregnancy "when it happens" -Returns to work on June 17, excited to get out of the house even if is it for work. The infant will go to a daycare that is run by her husband's aunt -Mood has been "ok" has both good and bad days, the bad days are hen the infant cries " "a lot" for no clear reason. The bad are not often.  -Going camping in a new RV this month -PCP: Nicki Reaper  -Dentist: last seen 1.5 years ago -wears glasses, last eye exam 1 year ago  BMI 33: desires to lose weight, has not started exercising yet   Newborn Details:  SINGLETON :  1. Baby's name: Kason. Birth weight: 5oz11oz, now a little over 7lbs Maternal Details:  Breast Feeding:  no Post partum depression/anxiety noted:  no Edinburgh Post-Partum Depression Score:  3  Date of last PAP: 2022  normal   Past Medical History:  Diagnosis Date   Anxiety    Epiploic appendagitis    Kidney stones 04/13/2022   Right Flank   MTHFR mutation    Von Willebrand disease (HCC) Dr. Janee Morn at [facility]    Past Surgical History:  Procedure Laterality Date   ESOPHAGOGASTRODUODENOSCOPY (EGD) WITH PROPOFOL N/A 01/30/2020   Procedure: ESOPHAGOGASTRODUODENOSCOPY (EGD) WITH PROPOFOL;  Surgeon: Wyline Mood, MD;  Location: [facility] ENDOSCOPY;  Service: Gastroenterology;  Laterality:  N/A;   WISDOM TOOTH EXTRACTION  08/2018   no bleeding complications    Prior to Admission medications   Medication Sig Start Date End Date Taking? Authorizing Provider  albuterol (VENTOLIN HFA) 108 (90 Base) MCG/ACT inhaler Inhale 1-2 puffs into the lungs every 6 (six) hours as needed for wheezing or shortness of breath. 04/28/22  Yes Lorre Munroe, NP  ibuprofen (ADVIL) 600 MG tablet Take 1 tablet (600 mg total) by mouth every 6 (six) hours as needed. 08/25/22  Yes Rigdon Macomber, Courtney Heys, CNM    Allergies  Allergen Reactions   Tylenol [Acetaminophen] Other (See Comments)    fever   Depo-Medrol [Methylprednisolone] Nausea Only    Feeling flushed     Social History   Socioeconomic History   Marital status: Married    Spouse name: Detria Cummings.   Number of children: 0   Years of education: 12   Highest education level: Not on file  Occupational History   Occupation: Lot Production designer, theatre/television/film    Comment: Recruitment consultant [employer]  Tobacco Use   Smoking status: Never    Passive exposure: Past   Smokeless tobacco: Never  Vaping Use   Vaping Use: Never used  Substance and Sexual Activity   Alcohol use: No    Alcohol/week: 0.0 standard drinks of alcohol   Drug use: No   Sexual activity: Not Currently    Partners: Male  Birth control/protection: None  Other Topics Concern   Not on file  Social History Narrative   Not on file   Social Determinants of Health   Financial Resource Strain: Low Risk  (12/31/2021)   Overall Financial Resource Strain (CARDIA)    Difficulty of Paying Living Expenses: Not hard at all  Food Insecurity: No Food Insecurity (08/23/2022)   Hunger Vital Sign    Worried About Running Out of Food in the Last Year: Never true    Ran Out of Food in the Last Year: Never true  Transportation Needs: No Transportation Needs (08/23/2022)   PRAPARE - Administrator, Civil Service (Medical): No    Lack of Transportation (Non-Medical): No  Physical Activity:  Sufficiently Active (12/31/2021)   Exercise Vital Sign    Days of Exercise per Week: 4 days    Minutes of Exercise per Session: 150+ min  Stress: No Stress Concern Present (12/31/2021)   [employer] of Occupational Health - Occupational Stress Questionnaire    Feeling of Stress : Not at all  Social Connections: Moderately Integrated (12/31/2021)   Social Connection and Isolation Panel [NHANES]    Frequency of Communication with Friends and Family: More than three times a week    Frequency of Social Gatherings with Friends and Family: Three times a week    Attends Religious Services: More than 4 times per year    Active Member of Clubs or Organizations: No    Attends Banker Meetings: Never    Marital Status: Living with partner  Intimate Partner Violence: Not At Risk (08/23/2022)   Humiliation, Afraid, Rape, and Kick questionnaire    Fear of Current or Ex-Partner: No    Emotionally Abused: No    Physically Abused: No    Sexually Abused: No    Family History  Problem Relation Age of Onset   Hypertension Father    Pulmonary embolism Father    Cancer Maternal Grandmother 24       breast   Arthritis Maternal Grandmother    Hyperlipidemia Maternal Grandmother    CAD Maternal Grandfather    Cancer Paternal Grandmother        melanoma, sinus cancer   Colon polyps Paternal Grandmother    Heart disease Paternal Grandfather    Gaucher's disease Half-Brother    Esophageal cancer Neg Hx    Stomach cancer Neg Hx    Liver disease Neg Hx     ROS see HPI  Physical Exam BP (!) 125/95   Pulse 72   Wt 171 lb 6.4 oz (77.7 kg)   Breastfeeding No   BMI 33.47 kg/m   Physical Exam Constitutional:      Appearance: Normal appearance.  Genitourinary:     Vulva normal.     Genitourinary Comments: Bimanual exam: uterus non gravid, non tender, no masses, adnexa non tender no masses, not enlarged, good tone.   Cardiovascular:     Rate and Rhythm: Normal rate and regular  rhythm.     Heart sounds: Normal heart sounds.  Pulmonary:     Effort: Pulmonary effort is normal.     Breath sounds: Normal breath sounds.  Chest:     Comments: Breasts: soft, no redness or masses, nipple erect and intact bilaterally  Abdominal:     General: Abdomen is flat.     Tenderness: There is abdominal tenderness.  Musculoskeletal:        General: Normal range of motion.     Cervical back:  Normal range of motion and neck supple.     Right lower leg: No edema.     Left lower leg: No edema.  Neurological:     General: No focal deficit present.     Mental Status: She is alert.  Skin:    General: Skin is warm.  Psychiatric:        Mood and Affect: Mood normal.     Assessment: 22 y.o. Tamara Mcclain presenting for 6 week postpartum visit  Plan: Problem List Items Addressed This Visit   None    1) Contraception Education given regarding options for contraception, including barrier methods and fertility awareness  2)  Pap - ASCCP guidelines and rational discussed.  Patient opts for 28yr screening interval. Due in 2025   3) Patient underwent screening for postpartum depression with no concerns noted.  4) Follow up 1 year for routine annual exam  5) BP elevated: fu with PCP   6) Ok to start any physical fitness routine, please reach out if  you are interested in seeing a provider for weight loss.   Carie Caddy, CNM  [facility]  10/16/22  4:46 PM

## 2022-10-27 ENCOUNTER — Ambulatory Visit (INDEPENDENT_AMBULATORY_CARE_PROVIDER_SITE_OTHER): Payer: BC Managed Care – PPO | Admitting: Internal Medicine

## 2022-10-27 ENCOUNTER — Encounter: Payer: Self-pay | Admitting: Internal Medicine

## 2022-10-27 VITALS — BP 126/86 | HR 81 | Temp 96.8°F | Wt 175.0 lb

## 2022-10-27 DIAGNOSIS — J4520 Mild intermittent asthma, uncomplicated: Secondary | ICD-10-CM | POA: Diagnosis not present

## 2022-10-27 DIAGNOSIS — K6389 Other specified diseases of intestine: Secondary | ICD-10-CM

## 2022-10-27 DIAGNOSIS — D6800 Von Willebrand disease, unspecified: Secondary | ICD-10-CM

## 2022-10-27 DIAGNOSIS — Z6834 Body mass index (BMI) 34.0-34.9, adult: Secondary | ICD-10-CM | POA: Diagnosis not present

## 2022-10-27 DIAGNOSIS — E6609 Other obesity due to excess calories: Secondary | ICD-10-CM | POA: Diagnosis not present

## 2022-10-27 DIAGNOSIS — F410 Panic disorder [episodic paroxysmal anxiety] without agoraphobia: Secondary | ICD-10-CM

## 2022-10-27 DIAGNOSIS — F411 Generalized anxiety disorder: Secondary | ICD-10-CM | POA: Diagnosis not present

## 2022-10-27 DIAGNOSIS — I10 Essential (primary) hypertension: Secondary | ICD-10-CM

## 2022-10-27 DIAGNOSIS — E7800 Pure hypercholesterolemia, unspecified: Secondary | ICD-10-CM | POA: Diagnosis not present

## 2022-10-27 MED ORDER — LISINOPRIL 10 MG PO TABS: 10.0000 mg | ORAL_TABLET | Freq: Every day | ORAL | 1 refills | Status: DC

## 2022-10-27 MED ORDER — BUSPIRONE HCL 5 MG PO TABS: 5.0000 mg | ORAL_TABLET | Freq: Every day | ORAL | 0 refills | Status: DC | PRN

## 2022-10-27 NOTE — Progress Notes (Signed)
Subjective:    Patient ID: Tamara Mcclain, female    DOB: 11-03-2000, 22 y.o.   MRN: [id number]  HPI  Patient presents to clinic today for follow-up of chronic conditions.  Epiploic Appendagitis: She is not currently taking any medications for this.  She is not following with GI.  HLD: Her last LDL was 110, triglycerides 145, 11/2021.  She is not taking any cholesterol-lowering medication at this time.  She does not consume a low-fat diet.  Anxiety/Panic Attacks: These occur rarely but she does feel like this has been worse since the birth of her son.  She is not currently taking any medications for this.  She is not currently seeing a therapist.  She denies depression, SI/HI.  Asthma: She denies chronic cough or shortness of breath.  She is not taking Symbicort but uses albuterol as needed.  There are no PFTs on file.  Von Willebrand Disease: This is unclear diagnosis.  She is not currently taking any medications for this.  She follows with hematology.  HTN: Her BP today is 126/92.  She is not taking any antihypertensive medications at this time.  ECG from 09/2021 reviewed.  Review of Systems     Past Medical History:  Diagnosis Date   Anxiety    Epiploic appendagitis    Kidney stones 04/13/2022   Right Flank   MTHFR mutation    Von Willebrand disease (HCC) Dr. Janee Morn at [facility]    Current Outpatient Medications  Medication Sig Dispense Refill   albuterol (VENTOLIN HFA) 108 (90 Base) MCG/ACT inhaler Inhale 1-2 puffs into the lungs every 6 (six) hours as needed for wheezing or shortness of breath. 8 g 1   ibuprofen (ADVIL) 600 MG tablet Take 1 tablet (600 mg total) by mouth every 6 (six) hours as needed. 30 tablet 0   No current facility-administered medications for this visit.    Allergies  Allergen Reactions   Tylenol [Acetaminophen] Other (See Comments)    fever   Depo-Medrol [Methylprednisolone] Nausea Only    Feeling flushed    Family History  Problem Relation  Age of Onset   Hypertension Father    Pulmonary embolism Father    Cancer Maternal Grandmother 38       breast   Arthritis Maternal Grandmother    Hyperlipidemia Maternal Grandmother    CAD Maternal Grandfather    Cancer Paternal Grandmother        melanoma, sinus cancer   Colon polyps Paternal Grandmother    Heart disease Paternal Grandfather    Gaucher's disease Half-Brother    Esophageal cancer Neg Hx    Stomach cancer Neg Hx    Liver disease Neg Hx     Social History   Socioeconomic History   Marital status: Married    Spouse name: Keisy Hunziker.   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  Tobacco Use   Smoking status: Never    Passive exposure: Past   Smokeless tobacco: Never  Vaping Use   Vaping Use: Never used  Substance and Sexual Activity   Alcohol use: No    Alcohol/week: 0.0 standard drinks of alcohol   Drug use: No   Sexual activity: Not Currently    Partners: Male    Birth control/protection: None  Other Topics Concern   Not on file  Social History  Narrative   Not on file   Social Determinants of Health   Financial Resource Strain: Low Risk  (10/23/2022)   Overall Financial Resource Strain (CARDIA)    Difficulty of Paying Living Expenses: Not hard at all  Food Insecurity: No Food Insecurity (10/23/2022)   Hunger Vital Sign    Worried About Running Out of Food in the Last Year: Never true    Ran Out of Food in the Last Year: Never true  Transportation Needs: No Transportation Needs (10/23/2022)   PRAPARE - Administrator, Civil Service (Medical): No    Lack of Transportation (Non-Medical): No  Physical Activity: Insufficiently Active (10/23/2022)   Exercise Vital Sign    Days of Exercise per Week: 2 days    Minutes of Exercise per Session: 20 min  Stress: No Stress Concern Present (10/23/2022)    [employer] of Occupational Health - Occupational Stress Questionnaire    Feeling of Stress : Only a little  Social Connections: Moderately Integrated (10/23/2022)   Social Connection and Isolation Panel [NHANES]    Frequency of Communication with Friends and Family: More than three times a week    Frequency of Social Gatherings with Friends and Family: Twice a week    Attends Religious Services: 1 to 4 times per year    Active Member of [employer] or Organizations: No    Attends Banker Meetings: Never    Marital Status: Married  Catering manager Violence: Not At Risk (08/23/2022)   Humiliation, Afraid, Rape, and Kick questionnaire    Fear of Current or Ex-Partner: No    Emotionally Abused: No    Physically Abused: No    Sexually Abused: No     Constitutional: Denies fever, malaise, fatigue, headache or abrupt weight changes.  HEENT: Denies eye pain, eye redness, ear pain, ringing in the ears, wax buildup, runny nose, nasal congestion, bloody nose, or sore throat. Respiratory: Denies difficulty breathing, shortness of breath, cough or sputum production.   Cardiovascular: Denies chest pain, chest tightness, palpitations or swelling in the hands or feet.  Gastrointestinal: Denies abdominal pain, bloating, constipation, diarrhea or blood in the stool.  GU: Denies urgency, frequency, pain with urination, burning sensation, blood in urine, odor or discharge. Musculoskeletal: Denies decrease in range of motion, difficulty with gait, muscle pain or joint pain and swelling.  Skin: Denies redness, rashes, lesions or ulcercations.  Neurological: Denies dizziness, difficulty with memory, difficulty with speech or problems with balance and coordination.  Psych: Patient has a history of anxiety.  Denies depression, SI/HI.  No other specific complaints in a complete review of systems (except as listed in HPI above).  Objective:   Physical Exam  BP 126/86 (BP Location: Left Arm,  Patient Position: Sitting, Cuff Size: Normal)   Pulse 81   Temp (!) 96.8 F (36 C) (Temporal)   Wt 175 lb (79.4 kg)   SpO2 99%   Breastfeeding No   BMI 34.18 kg/m   Wt Readings from Last 3 Encounters:  10/16/22 171 lb 6.4 oz (77.7 kg)  09/11/22 167 lb 4.8 oz (75.9 kg)  08/28/22 168 lb (76.2 kg)    General: Appears her stated age, obese, in NAD. Skin: Warm, dry and intact.  HEENT: Head: normal shape and size; Eyes: sclera white, no icterus, conjunctiva Mcclain, PERRLA and EOMs intact;  Cardiovascular: Normal rate and rhythm. S1,S2 noted.  No murmur, rubs or gallops noted. No JVD or BLE edema.  Pulmonary/Chest: Normal effort and positive vesicular  breath sounds. No respiratory distress. No wheezes, rales or ronchi noted.  Abdomen: Soft and nontender. Normal bowel sounds. No distention or masses noted.  Musculoskeletal:  No difficulty with gait.  Neurological: Alert and oriented.  Coordination normal.  Psychiatric: Mood and affect normal.  Mildly anxious appearing. Judgment and thought content normal.    BMET    Component Value Date/Time   NA 136 08/23/2022 0935   NA 136 01/28/2022 1047   K 3.9 08/23/2022 0935   CL 107 08/23/2022 0935   CO2 20 (L) 08/23/2022 0935   GLUCOSE 94 08/23/2022 0935   BUN 9 08/23/2022 0935   BUN 4 (L) 01/28/2022 1047   CREATININE 0.55 08/23/2022 0935   CALCIUM 8.7 (L) 08/23/2022 0935   GFRNONAA >60 08/23/2022 0935   GFRAA >60 04/09/2018 2151    Lipid Panel     Component Value Date/Time   CHOL 168 11/27/2021 1428   TRIG 145 11/27/2021 1428   HDL 32 (L) 11/27/2021 1428   CHOLHDL 5.3 (H) 11/27/2021 1428   LDLCALC 110 (H) 11/27/2021 1428    CBC    Component Value Date/Time   WBC 18.7 (H) 08/24/2022 0649   RBC 3.88 08/24/2022 0649   HGB 10.5 (L) 08/24/2022 0649   HGB 11.8 06/09/2022 0926   HCT 32.4 (L) 08/24/2022 0649   HCT 36.2 06/09/2022 0926   PLT 262 08/24/2022 0649   PLT 324 06/09/2022 0926   MCV 83.5 08/24/2022 0649   MCV 90  06/09/2022 0926   MCH 27.1 08/24/2022 0649   MCHC 32.4 08/24/2022 0649   RDW 14.6 08/24/2022 0649   RDW 13.4 06/09/2022 0926   LYMPHSABS 1.5 06/09/2022 0926   MONOABS 0.6 07/24/2021 1612   EOSABS 0.0 06/09/2022 0926   BASOSABS 0.0 06/09/2022 0926    Hgb A1C Lab Results  Component Value Date   HGBA1C 5.3 01/28/2022            Assessment & Plan:     RTC in 6 months for annual exam Nicki Reaper, NP

## 2022-10-27 NOTE — Assessment & Plan Note (Signed)
Will start Lisinopril 10 mg daily Reinforced DASH diet and exercise for weight loss

## 2022-10-27 NOTE — Assessment & Plan Note (Signed)
Will trial Buspirone 5 mg daily prn Support offered

## 2022-10-27 NOTE — Assessment & Plan Note (Signed)
Encourage low-fat diet 

## 2022-10-27 NOTE — Assessment & Plan Note (Signed)
Continue albuterol as needed 

## 2022-10-27 NOTE — Assessment & Plan Note (Signed)
Will trial Buspirone 5 mg daily prn Support offered 

## 2022-10-27 NOTE — Assessment & Plan Note (Signed)
Encouraged diet and exercise for weight loss ?

## 2022-10-27 NOTE — Assessment & Plan Note (Signed)
She will continue to follow with hematology 

## 2022-10-27 NOTE — Assessment & Plan Note (Signed)
Currently not an issue Will monitor 

## 2022-10-27 NOTE — Patient Instructions (Signed)

## 2022-10-29 ENCOUNTER — Encounter: Payer: Self-pay | Admitting: Internal Medicine

## 2022-11-16 ENCOUNTER — Inpatient Hospital Stay: Payer: BC Managed Care – PPO | Attending: Oncology | Admitting: Oncology

## 2022-11-16 ENCOUNTER — Encounter: Payer: Self-pay | Admitting: Oncology

## 2022-11-16 NOTE — Assessment & Plan Note (Deleted)
Self reported history of VWD Labs are reviewed and discussed with patient. - previous baseline von Willebrand testing was not consistent with von Willebrand disease.  She also does not have any history of excessive bleeding history.   - VWD levels done during first trimester and third trimester, - no deficiency.  - We discussed that pregnancy may increase von Willebrand levels. She does not need DDAVP or factor replacement prior to delivery. The postpartum period requires extra vigilance. Recommend to repeat von Willebrand panel 2 days after delivery and 2 weeks after delivery- discussed with her Ob team Dominic, Lydia via secure chat. She will order labs.  She will follow up with me in 3 months.  

## 2022-12-15 ENCOUNTER — Ambulatory Visit: Payer: BC Managed Care – PPO | Admitting: Gastroenterology

## 2022-12-15 ENCOUNTER — Emergency Department
Admission: EM | Admit: 2022-12-15 | Discharge: 2022-12-15 | Discharge status: Against Medical Advice | Payer: BC Managed Care – PPO | Attending: Emergency Medicine | Admitting: Emergency Medicine

## 2022-12-15 ENCOUNTER — Other Ambulatory Visit: Payer: Self-pay

## 2022-12-15 ENCOUNTER — Encounter: Payer: Self-pay | Admitting: Emergency Medicine

## 2022-12-15 DIAGNOSIS — Z5321 Procedure and treatment not carried out due to patient leaving prior to being seen by health care provider: Secondary | ICD-10-CM | POA: Diagnosis not present

## 2022-12-15 DIAGNOSIS — Z3A00 Weeks of gestation of pregnancy not specified: Secondary | ICD-10-CM | POA: Insufficient documentation

## 2022-12-15 DIAGNOSIS — O26891 Other specified pregnancy related conditions, first trimester: Secondary | ICD-10-CM | POA: Diagnosis not present

## 2022-12-15 LAB — URINALYSIS, ROUTINE W REFLEX MICROSCOPIC
Bilirubin Urine: NEGATIVE
Glucose, UA: NEGATIVE mg/dL
Hgb urine dipstick: NEGATIVE
Ketones, ur: NEGATIVE mg/dL
Nitrite: NEGATIVE
Protein, ur: NEGATIVE mg/dL
Specific Gravity, Urine: 1.018 (ref 1.005–1.030)
pH: 7 (ref 5.0–8.0)

## 2022-12-15 LAB — CBC
HCT: 39.7 % (ref 36.0–46.0)
Hemoglobin: 13 g/dL (ref 12.0–15.0)
MCH: 25.8 pg — ABNORMAL LOW (ref 26.0–34.0)
MCHC: 32.7 g/dL (ref 30.0–36.0)
MCV: 78.9 fL — ABNORMAL LOW (ref 80.0–100.0)
Platelets: 397 10*3/uL (ref 150–400)
RBC: 5.03 MIL/uL (ref 3.87–5.11)
RDW: 13.5 % (ref 11.5–15.5)
WBC: 9.8 10*3/uL (ref 4.0–10.5)
nRBC: 0 % (ref 0.0–0.2)

## 2022-12-15 LAB — COMPREHENSIVE METABOLIC PANEL
ALT: 19 U/L (ref 0–44)
AST: 19 U/L (ref 15–41)
Albumin: 3.7 g/dL (ref 3.5–5.0)
Alkaline Phosphatase: 89 U/L (ref 38–126)
Anion gap: 8 (ref 5–15)
BUN: 7 mg/dL (ref 6–20)
CO2: 24 mmol/L (ref 22–32)
Calcium: 9.2 mg/dL (ref 8.9–10.3)
Chloride: 106 mmol/L (ref 98–111)
Creatinine, Ser: 0.69 mg/dL (ref 0.44–1.00)
GFR, Estimated: >60 mL/min (ref >60)
Glucose, Bld: 99 mg/dL (ref 70–99)
Potassium: 4.3 mmol/L (ref 3.5–5.1)
Sodium: 138 mmol/L (ref 135–145)
Total Bilirubin: 1.2 mg/dL (ref 0.3–1.2)
Total Protein: 7.6 g/dL (ref 6.5–8.1)

## 2022-12-15 LAB — PREGNANCY, URINE: Preg Test, Ur: POSITIVE — AB

## 2022-12-15 LAB — LIPASE, BLOOD: Lipase: 29 U/L (ref 11–51)

## 2022-12-15 LAB — POC URINE PREG, ED: Preg Test, Ur: POSITIVE — AB

## 2022-12-15 NOTE — ED Notes (Signed)
See triage note  Presents with some abd cramping w/o vaginal bleeding States is feeling better States she is PP 3 months  This nurse encouraged pt to stay   but states she will follow up with her ob/gyn

## 2022-12-15 NOTE — ED Provider Notes (Signed)
Patient left prior to my evaluation   Tamara Semen, MD 12/15/22 1110

## 2022-12-15 NOTE — ED Triage Notes (Signed)
Pt states that she was supposed to start her cycle yesterday, states that she has been cramping for the past few days, states that she took a preg test last pm and got a positive test, states that she has a 67 month old. States that she did have 2 cycles after delivery, states that she passed something that looked white in her urine catch last pm, pt is tearful in triage and states that her first pregnancy was hard on her

## 2023-01-05 ENCOUNTER — Encounter: Payer: Self-pay | Admitting: Obstetrics & Gynecology

## 2023-01-05 ENCOUNTER — Ambulatory Visit (INDEPENDENT_AMBULATORY_CARE_PROVIDER_SITE_OTHER): Payer: BC Managed Care – PPO | Admitting: Obstetrics & Gynecology

## 2023-01-05 VITALS — BP 110/70 | HR 82 | Ht 60.0 in | Wt 179.0 lb

## 2023-01-05 DIAGNOSIS — Z3041 Encounter for surveillance of contraceptive pills: Secondary | ICD-10-CM

## 2023-01-05 MED ORDER — JUNEL FE 1/20 1-20 MG-MCG PO TABS: 1.0000 | ORAL_TABLET | Freq: Every day | ORAL | 4 refills | Status: DC

## 2023-01-05 NOTE — Progress Notes (Signed)
    GYNECOLOGY PROGRESS NOTE  Subjective:    Patient ID: Tamara Mcclain, female    DOB: [birth year], 22 y.o.   MRN: [id number]  HPI  Patient is a 22 y.o. [id number] who presents for discussion about birth control. She started taking some Junel pills (combination OCPs) that she had at her house. She had HTN during her pregnancy. She is considering a BTL in the future. She used Nexplanon but gained weight with it. She is afraid of the pain associated with IUD insertion. Her husband declines a vasectomy.  She is remembering to take the OCPs daily.   The following portions of the patient's history were reviewed and updated as appropriate: allergies, current medications, past family history, past medical history, past social history, past surgical history, and problem list.  Review of Systems Pertinent items are noted in HPI.   Objective:   Blood pressure (!) 134/100, pulse 82, height 5' (1.524 m), weight 179 lb (81.2 kg), last menstrual period 12/14/2022, not currently breastfeeding. Body mass index is 34.96 kg/m. Well nourished, well hydrated White female, no apparent distress She is ambulating and conversing normally.    Assessment:   1. Encounter for surveillance of contraceptive pills     Because of her h/o HTN, I will see her in a month to re check her BP Plan:   1. Encounter for surveillance of contraceptive pills  As above

## 2023-01-22 ENCOUNTER — Encounter: Payer: Self-pay | Admitting: Internal Medicine

## 2023-01-25 ENCOUNTER — Encounter: Payer: Self-pay | Admitting: Internal Medicine

## 2023-01-25 ENCOUNTER — Ambulatory Visit (INDEPENDENT_AMBULATORY_CARE_PROVIDER_SITE_OTHER): Payer: BC Managed Care – PPO | Admitting: Internal Medicine

## 2023-01-25 VITALS — BP 156/100 | HR 111 | Temp 96.8°F | Wt 180.0 lb

## 2023-01-25 DIAGNOSIS — I10 Essential (primary) hypertension: Secondary | ICD-10-CM

## 2023-01-25 DIAGNOSIS — J029 Acute pharyngitis, unspecified: Secondary | ICD-10-CM

## 2023-01-25 DIAGNOSIS — Z23 Encounter for immunization: Secondary | ICD-10-CM | POA: Diagnosis not present

## 2023-01-25 DIAGNOSIS — Z6835 Body mass index (BMI) 35.0-35.9, adult: Secondary | ICD-10-CM

## 2023-01-25 MED ORDER — METOPROLOL SUCCINATE ER 25 MG PO TB24: 25.0000 mg | ORAL_TABLET | Freq: Every day | ORAL | 0 refills | Status: DC

## 2023-01-25 NOTE — Assessment & Plan Note (Signed)
Encouraged diet and exercise for weight loss ?

## 2023-01-25 NOTE — Patient Instructions (Signed)
How to Take Your Blood Pressure Blood pressure measures how strongly your blood is pressing against the walls of your arteries. Arteries are blood vessels that carry blood from your heart throughout your body. You can take your blood pressure at home with a machine. You may need to check your blood pressure at home: To check if you have high blood pressure (hypertension). To check your blood pressure over time. To make sure your blood pressure medicine is working. Supplies needed: Blood pressure machine, or monitor. A chair to sit in. This should be a chair where you can sit upright with your back supported. Do not sit on a soft couch or an armchair. Table or desk. Small notebook. Pencil or pen. How to prepare Avoid these things for 30 minutes before checking your blood pressure: Having drinks with caffeine in them, such as coffee or tea. Drinking alcohol. Eating. Smoking. Exercising. Do these things five minutes before checking your blood pressure: Go to the bathroom and pee (urinate). Sit in a chair. Be quiet. Do not talk. How to take your blood pressure Follow the instructions that came with your machine. If you have a digital blood pressure monitor, these may be the instructions: Sit up straight. Place your feet on the floor. Do not cross your ankles or legs. Rest your left arm at the level of your heart. You may rest it on a table, desk, or chair. Pull up your shirt sleeve. Wrap the blood pressure cuff around the upper part of your left arm. The cuff should be 1 inch (2.5 cm) above your elbow. It is best to wrap the cuff around bare skin. Fit the cuff snugly around your arm, but not too tightly. You should be able to place only one finger between the cuff and your arm. Place the cord so that it rests in the bend of your elbow. Press the power button. Sit quietly while the cuff fills with air and loses air. Write down the numbers on the screen. Wait 2-3 minutes and then repeat  steps 1-10. What do the numbers mean? Two numbers make up your blood pressure. The first number is called systolic pressure. The second is called diastolic pressure. An example of a blood pressure reading is "120 over 80" (or 120/80). If you are an adult and do not have a medical condition, use this guide to find out if your blood pressure is normal: Normal First number: below 120. Second number: below 80. Elevated First number: 120-129. Second number: below 80. Hypertension stage 1 First number: 130-139. Second number: 80-89. Hypertension stage 2 First number: 140 or above. Second number: 90 or above. Your blood pressure is above normal even if only the first or only the second number is above normal. Follow these instructions at home: Medicines Take over-the-counter and prescription medicines only as told by your doctor. Tell your doctor if your medicine is causing side effects. General instructions Check your blood pressure as often as your doctor tells you to. Check your blood pressure at the same time every day. Take your monitor to your next doctor's appointment. Your doctor will: Make sure you are using it correctly. Make sure it is working right. Understand what your blood pressure numbers should be. Keep all follow-up visits. General tips You will need a blood pressure machine or monitor. Your doctor can suggest a monitor. You can buy one at a drugstore or online. When choosing one: Choose one with an arm cuff. Choose one that wraps around your  upper arm. Only one finger should fit between your arm and the cuff. Do not choose one that measures your blood pressure from your wrist or finger. Where to find more information American Heart Association: www.heart.org Contact a doctor if: Your blood pressure keeps being high. Your blood pressure is suddenly low. Get help right away if: Your first blood pressure number is higher than 180. Your second blood pressure number is  higher than 120. These symptoms may be an emergency. Do not wait to see if the symptoms will go away. Get help right away. Call 911. Summary Check your blood pressure at the same time every day. Avoid caffeine, alcohol, smoking, and exercise for 30 minutes before checking your blood pressure. Make sure you understand what your blood pressure numbers should be. This information is not intended to replace advice given to you by your health care provider. Make sure you discuss any questions you have with your health care provider. Document Revised: 01/09/2021 Document Reviewed: 01/09/2021 Elsevier Patient Education  2024 [employer].

## 2023-01-25 NOTE — Progress Notes (Signed)
Subjective:    Patient ID: Tamara Mcclain, female    DOB: 12-22-2000, 22 y.o.   MRN: [id number]  HPI  Patient presents the clinic today with concern for elevated blood pressure.  She reports her blood pressure few days ago was 140/106. She had a headache and felt palpitations. She did feel somewhat lightheaded. She is not currently taking any antihypertensive medications but has been on lisinopril in the past. ECG from 09/2021 reviewed.  She also reports headache and fever. She does have a slight sore throat but denies runny nose, nasal congestion, ear pain, cough or shortness of breath. She denies difficulty swallowing. She denies chills or body aches. She has tried ibuprofen OTC with minimal relief of symptoms although she is feeling better now. She reports she had a negative home COVID test. She has had sick contacts.  Review of Systems  Past Medical History:  Diagnosis Date   Anxiety    Epiploic appendagitis    Kidney stones 04/13/2022   Right Flank   MTHFR mutation    Von Willebrand disease (HCC) Dr. Janee Morn at [facility]    Current Outpatient Medications  Medication Sig Dispense Refill   albuterol (VENTOLIN HFA) 108 (90 Base) MCG/ACT inhaler Inhale 1-2 puffs into the lungs every 6 (six) hours as needed for wheezing or shortness of breath. (Patient not taking: Reported on 01/05/2023) 8 g 1   ibuprofen (ADVIL) 600 MG tablet Take 1 tablet (600 mg total) by mouth every 6 (six) hours as needed. (Patient not taking: Reported on 01/05/2023) 30 tablet 0   JUNEL FE 1/20 1-20 MG-MCG tablet Take 1 tablet by mouth daily. 84 tablet 4   No current facility-administered medications for this visit.    Allergies  Allergen Reactions   Tylenol [Acetaminophen] Other (See Comments)    fever   Depo-Medrol [Methylprednisolone] Nausea Only    Feeling flushed    Family History  Problem Relation Age of Onset   Hypertension Father    Pulmonary embolism Father    Cancer Maternal Grandmother 76        breast   Arthritis Maternal Grandmother    Hyperlipidemia Maternal Grandmother    CAD Maternal Grandfather    Cancer Paternal Grandmother        melanoma, sinus cancer   Colon polyps Paternal Grandmother    Heart disease Paternal Grandfather    Gaucher's disease Half-Brother    Esophageal cancer Neg Hx    Stomach cancer Neg Hx    Liver disease Neg Hx     Social History   Socioeconomic History   Marital status: Married    Spouse name: Tabithia Oguinn.   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  Tobacco Use   Smoking status: Never    Passive exposure: Past   Smokeless tobacco: Never  Vaping Use   Vaping status: Never Used  Substance and Sexual Activity   Alcohol use: No    Alcohol/week: 0.0 standard drinks of alcohol   Drug use: No   Sexual activity: Yes    Partners: Male    Birth control/protection: None  Other Topics Concern   Not on file  Social History Narrative   Not on file   Social Determinants of Health   Financial Resource Strain: Low Risk  (10/23/2022)   Overall Financial Resource Strain (CARDIA)  Difficulty of Paying Living Expenses: Not hard at all  Food Insecurity: No Food Insecurity (10/23/2022)   Hunger Vital Sign    Worried About Running Out of Food in the Last Year: Never true    Ran Out of Food in the Last Year: Never true  Transportation Needs: No Transportation Needs (10/23/2022)   PRAPARE - Administrator, Civil Service (Medical): No    Lack of Transportation (Non-Medical): No  Physical Activity: Insufficiently Active (10/23/2022)   Exercise Vital Sign    Days of Exercise per Week: 2 days    Minutes of Exercise per Session: 20 min  Stress: No Stress Concern Present (10/23/2022)   [employer] of Occupational Health - Occupational Stress Questionnaire    Feeling of Stress : Only a little   Social Connections: Moderately Integrated (10/23/2022)   Social Connection and Isolation Panel [NHANES]    Frequency of Communication with Friends and Family: More than three times a week    Frequency of Social Gatherings with Friends and Family: Twice a week    Attends Religious Services: 1 to 4 times per year    Active Member of [employer] or Organizations: No    Attends Banker Meetings: Not on file    Marital Status: Married  Catering manager Violence: Not At Risk (08/23/2022)   Humiliation, Afraid, Rape, and Kick questionnaire    Fear of Current or Ex-Partner: No    Emotionally Abused: No    Physically Abused: No    Sexually Abused: No     Constitutional: Patient reports headache and fever.  Denies malaise, fatigue, or abrupt weight changes.  HEENT: Pt reports sore throat. Denies eye pain, eye redness, ear pain, ringing in the ears, wax buildup, runny nose, nasal congestion, bloody nose. Respiratory: Denies difficulty breathing, shortness of breath, cough or sputum production.   Cardiovascular: Denies chest pain, chest tightness, palpitations or swelling in the hands or feet.  Gastrointestinal: Denies abdominal pain, bloating, constipation, diarrhea or blood in the stool.  GU: Denies urgency, frequency, pain with urination, burning sensation, blood in urine, odor or discharge. Musculoskeletal: Denies decrease in range of motion, difficulty with gait, muscle pain or joint pain and swelling.  Skin: Denies redness, rashes, lesions or ulcercations.  Neurological: Pt reports intermittent lightheadedness. Denies dizziness, difficulty with memory, difficulty with speech or problems with balance and coordination.  Psych: Patient has a history of anxiety.  Denies depression, SI/HI.  No other specific complaints in a complete review of systems (except as listed in HPI above).     Objective:   Physical Exam  BP (!) 156/100 (BP Location: Right Arm, Patient Position: Sitting, Cuff  Size: Normal)   Pulse (!) 111   Temp (!) 96.8 F (36 C) (Temporal)   Wt 180 lb (81.6 kg)   LMP 12/14/2022 (Approximate)   SpO2 98%   BMI 35.15 kg/m   Wt Readings from Last 3 Encounters:  01/05/23 179 lb (81.2 kg)  12/15/22 175 lb (79.4 kg)  10/27/22 175 lb (79.4 kg)    General: Appears her stated age, obese, in NAD. Skin: Warm, dry and intact.  HEENT: Head: normal shape and size; Eyes: sclera white, no icterus, conjunctiva Mcclain, PERRLA and EOMs intact; Throat/Mouth: Teeth present, mucosa Mcclain and moist, no exudate, lesions or ulcerations noted.  Neck:  No adenopathy noted. Cardiovascular: Tachcyardic with normal rhythm. S1,S2 noted.  No murmur, rubs or gallops noted.  Pulmonary/Chest: Normal effort and positive vesicular breath sounds. No respiratory distress.  No wheezes, rales or ronchi noted.  Musculoskeletal: No difficulty with gait.  Neurological: Alert and oriented. Coordination normal.    BMET    Component Value Date/Time   NA 138 12/15/2022 1030   NA 136 01/28/2022 1047   K 4.3 12/15/2022 1030   CL 106 12/15/2022 1030   CO2 24 12/15/2022 1030   GLUCOSE 99 12/15/2022 1030   BUN 7 12/15/2022 1030   BUN 4 (L) 01/28/2022 1047   CREATININE 0.69 12/15/2022 1030   CALCIUM 9.2 12/15/2022 1030   GFRNONAA >60 12/15/2022 1030   GFRAA >60 04/09/2018 2151    Lipid Panel     Component Value Date/Time   CHOL 168 11/27/2021 1428   TRIG 145 11/27/2021 1428   HDL 32 (L) 11/27/2021 1428   CHOLHDL 5.3 (H) 11/27/2021 1428   LDLCALC 110 (H) 11/27/2021 1428    CBC    Component Value Date/Time   WBC 9.8 12/15/2022 1030   RBC 5.03 12/15/2022 1030   HGB 13.0 12/15/2022 1030   HGB 11.8 06/09/2022 0926   HCT 39.7 12/15/2022 1030   HCT 36.2 06/09/2022 0926   PLT 397 12/15/2022 1030   PLT 324 06/09/2022 0926   MCV 78.9 (L) 12/15/2022 1030   MCV 90 06/09/2022 0926   MCH 25.8 (L) 12/15/2022 1030   MCHC 32.7 12/15/2022 1030   RDW 13.5 12/15/2022 1030   RDW 13.4 06/09/2022  0926   LYMPHSABS 1.5 06/09/2022 0926   MONOABS 0.6 07/24/2021 1612   EOSABS 0.0 06/09/2022 0926   BASOSABS 0.0 06/09/2022 0926    Hgb A1C Lab Results  Component Value Date   HGBA1C 5.3 01/28/2022            Assessment & Plan:   Acute Headache, fever, sore throat:  No indication for COVID testing as she had a negative home COVID test and is already feeling better Okay to take ibuprofen or salt water gargles as needed for sore throat and headache Notify me if symptoms persist or worsen  RTC in 2 weeks follow up HTN, 3 months for your annual exam Nicki Reaper, NP

## 2023-01-25 NOTE — Assessment & Plan Note (Signed)
Given that she is tachycardic and hypertensive, I wanted to start labetalol however she did not want to take medication 2 times daily if at all possible We will trial metoprolol 25 mg ER daily Reinforced DASH diet and exercise for weight loss

## 2023-01-30 ENCOUNTER — Ambulatory Visit
Admission: EM | Admit: 2023-01-30 | Discharge: 2023-01-30 | Disposition: A | Discharge status: Home / Self Care | Payer: BC Managed Care – PPO | Attending: Emergency Medicine | Admitting: Emergency Medicine

## 2023-01-30 DIAGNOSIS — J069 Acute upper respiratory infection, unspecified: Secondary | ICD-10-CM

## 2023-01-30 DIAGNOSIS — J029 Acute pharyngitis, unspecified: Secondary | ICD-10-CM

## 2023-01-30 LAB — POCT RAPID STREP A (OFFICE): Rapid Strep A Screen: NEGATIVE

## 2023-01-30 NOTE — Discharge Instructions (Addendum)
Your strep test is negative.  Take Tylenol or ibuprofen as needed for discomfort.  Follow up with your primary care provider if your symptoms are not improving.

## 2023-01-30 NOTE — ED Provider Notes (Signed)
Renaldo Fiddler    CSN: [id number] Arrival date & time: 01/30/23  4098      History   Chief Complaint Chief Complaint  Patient presents with   Sore Throat    HPI Tamara Mcclain is a 22 y.o. female.  Patient presents with 1 week history of of sore throat.  She reports mild occasional cough.  No fever, rash, shortness of breath, or other symptoms.  No treatments attempted at home.  Negative COVID test at home.  Patient was seen by her PCP on 01/25/2023; diagnosed with viral pharyngitis; symptomatic treatment.  The history is provided by the patient and medical records.    Past Medical History:  Diagnosis Date   Anxiety    Epiploic appendagitis    Kidney stones 04/13/2022   Right Flank   MTHFR mutation    Von Willebrand disease (HCC) Dr. Janee Morn at [facility]    Patient Active Problem List   Diagnosis Date Noted   GAD (generalized anxiety disorder) 10/27/2022   Pure hypercholesterolemia 10/27/2022   Asthma 02/10/2022   High blood pressure 11/10/2021   Class 2 obesity due to excess calories with body mass index (BMI) of 35.0 to 35.9 in adult 11/10/2021   Panic attacks 12/05/2019   Epiploic appendagitis 06/28/2019   Von Willebrand disease (HCC) 08/18/2016    Past Surgical History:  Procedure Laterality Date   ESOPHAGOGASTRODUODENOSCOPY (EGD) WITH PROPOFOL N/A 01/30/2020   Procedure: ESOPHAGOGASTRODUODENOSCOPY (EGD) WITH PROPOFOL;  Surgeon: Wyline Mood, MD;  Location: [facility];  Service: Gastroenterology;  Laterality: N/A;   WISDOM TOOTH EXTRACTION  08/2018   no bleeding complications    OB History     Gravida  3   Para  1   Term  1   Preterm      AB  2   Living  1      SAB  2   IAB      Ectopic      Multiple  0   Live Births  1            Home Medications    Prior to Admission medications   Medication Sig Start Date End Date Taking? Authorizing Provider  albuterol (VENTOLIN HFA) 108 (90 Base) MCG/ACT inhaler Inhale 1-2 puffs  into the lungs every 6 (six) hours as needed for wheezing or shortness of breath. 04/28/22   Lorre Munroe, NP  ibuprofen (ADVIL) 600 MG tablet Take 1 tablet (600 mg total) by mouth every 6 (six) hours as needed. Patient not taking: Reported on 01/05/2023 08/25/22   Ellwood Sayers, CNM  JUNEL FE 1/20 1-20 MG-MCG tablet Take 1 tablet by mouth daily. 01/05/23   Allie Bossier, MD  metoprolol succinate (TOPROL-XL) 25 MG 24 hr tablet Take 1 tablet (25 mg total) by mouth daily. 01/25/23   Lorre Munroe, NP    Family History Family History  Problem Relation Age of Onset   Hypertension Father    Pulmonary embolism Father    Cancer Maternal Grandmother 59       breast   Arthritis Maternal Grandmother    Hyperlipidemia Maternal Grandmother    CAD Maternal Grandfather    Cancer Paternal Grandmother        melanoma, sinus cancer   Colon polyps Paternal Grandmother    Heart disease Paternal Grandfather    Gaucher's disease Half-Brother    Esophageal cancer Neg Hx    Stomach cancer Neg Hx    Liver disease Neg  Hx     Social History Social History   Tobacco Use   Smoking status: Never    Passive exposure: Past   Smokeless tobacco: Never  Vaping Use   Vaping status: Never Used  Substance Use Topics   Alcohol use: No    Alcohol/week: 0.0 standard drinks of alcohol   Drug use: No     Allergies   Tylenol [acetaminophen] and Depo-medrol [methylprednisolone]   Review of Systems Review of Systems  Constitutional:  Negative for chills and fever.  HENT:  Positive for sore throat. Negative for ear pain.   Respiratory:  Positive for cough. Negative for shortness of breath.   Cardiovascular:  Negative for chest pain and palpitations.  Gastrointestinal:  Negative for diarrhea and vomiting.  Skin:  Negative for color change and rash.     Physical Exam Triage Vital Signs ED Triage Vitals  Encounter Vitals Group     BP 01/30/23 0827 (!) 122/91     Systolic BP Percentile --       Diastolic BP Percentile --      Pulse Rate 01/30/23 0819 98     Resp 01/30/23 0819 18     Temp 01/30/23 0819 98.2 F (36.8 C)     Temp src --      SpO2 01/30/23 0819 98 %     Weight --      Height --      Head Circumference --      Peak Flow --      Pain Score 01/30/23 0815 8     Pain Loc --      Pain Education --      Exclude from Growth Chart --    No data found.  Updated Vital Signs BP (!) 122/91   Pulse 98   Temp 98.2 F (36.8 C)   Resp 18   LMP 01/23/2023 (Approximate)   SpO2 98%   Visual Acuity Right Eye Distance:   Left Eye Distance:   Bilateral Distance:    Right Eye Near:   Left Eye Near:    Bilateral Near:     Physical Exam Vitals and nursing note reviewed.  Constitutional:      General: She is not in acute distress.    Appearance: She is well-developed.  HENT:     Right Ear: Tympanic membrane normal.     Left Ear: Tympanic membrane normal.     Nose: Nose normal.     Mouth/Throat:     Mouth: Mucous membranes are moist.     Pharynx: Posterior oropharyngeal erythema present.     Comments: Clear PND. Cardiovascular:     Rate and Rhythm: Normal rate and regular rhythm.     Heart sounds: Normal heart sounds.  Pulmonary:     Effort: Pulmonary effort is normal. No respiratory distress.     Breath sounds: Normal breath sounds.  Musculoskeletal:     Cervical back: Neck supple.  Skin:    General: Skin is warm and dry.  Neurological:     Mental Status: She is alert.  Psychiatric:        Mood and Affect: Mood normal.        Behavior: Behavior normal.      UC Treatments / Results  Labs (all labs ordered are listed, but only abnormal results are displayed) Labs Reviewed  POCT RAPID STREP A (OFFICE)    EKG   Radiology No results found.  Procedures Procedures (including critical care time)  Medications  Ordered in UC Medications - No data to display  Initial Impression / Assessment and Plan / UC Course  I have reviewed the triage vital  signs and the nursing notes.  Pertinent labs & imaging results that were available during my care of the patient were reviewed by me and considered in my medical decision making (see chart for details).    Viral pharyngitis, viral URI.  Rapid strep negative.  Patient declines COVID test here; She has had negative tests at home.  Discussed symptomatic treatment including Tylenol, rest, hydration.  Instructed patient to follow up with PCP if symptoms are not improving.  She agrees to plan of care.    Final Clinical Impressions(s) / UC Diagnoses   Final diagnoses:  Viral pharyngitis  Viral URI     Discharge Instructions      Your strep test is negative.  Take Tylenol or ibuprofen as needed for discomfort.  Follow-up with your primary care provider if your symptoms are not improving.      ED Prescriptions   None    PDMP not reviewed this encounter.   Mickie Bail, NP 01/30/23 [phone]

## 2023-01-30 NOTE — ED Triage Notes (Signed)
Patient to Urgent Care with complaints of a sore throat for approx 1 week. Painful to swallow. Occasional cough. Denies any known fevers.   Multiple home negative home Covid tests.

## 2023-02-02 ENCOUNTER — Encounter: Payer: Self-pay | Admitting: Obstetrics & Gynecology

## 2023-02-02 ENCOUNTER — Ambulatory Visit (INDEPENDENT_AMBULATORY_CARE_PROVIDER_SITE_OTHER): Payer: BC Managed Care – PPO | Admitting: Obstetrics & Gynecology

## 2023-02-02 VITALS — BP 135/103 | HR 76 | Ht 60.0 in | Wt 180.0 lb

## 2023-02-02 DIAGNOSIS — Z3202 Encounter for pregnancy test, result negative: Secondary | ICD-10-CM

## 2023-02-02 DIAGNOSIS — Z30430 Encounter for insertion of intrauterine contraceptive device: Secondary | ICD-10-CM

## 2023-02-02 DIAGNOSIS — R030 Elevated blood-pressure reading, without diagnosis of hypertension: Secondary | ICD-10-CM

## 2023-02-02 DIAGNOSIS — Z3009 Encounter for other general counseling and advice on contraception: Secondary | ICD-10-CM

## 2023-02-02 DIAGNOSIS — Z30014 Encounter for initial prescription of intrauterine contraceptive device: Secondary | ICD-10-CM | POA: Diagnosis not present

## 2023-02-02 LAB — POCT URINE PREGNANCY: Preg Test, Ur: NEGATIVE

## 2023-02-02 MED ORDER — LEVONORGESTREL 20 MCG/DAY IU IUD
1.0000 | INTRAUTERINE_SYSTEM | Freq: Once | INTRAUTERINE | Status: AC
Start: 2023-02-02 — End: 2023-02-02
  Administered 2023-02-02: 1 via INTRAUTERINE

## 2023-02-02 NOTE — Progress Notes (Signed)
GYNECOLOGY PROGRESS NOTE  Subjective:    Patient ID: Tamara Mcclain, female    DOB: 12/18/2000, 22 y.o.   MRN: [id number]  HPI  Patient is a 22 y.o. [id number] here today for follow up after starting OCPs for contraception as well as a BP check. When she was last here her BP was 134/100. She had CHTN during her pregnancy. She has an appt with her primary care provider next week for evaluation of her BP.  I told her that I don't think combination OCPs are a good option for her with her elevated BPs. After a thorough discussion, she opts for IUD insertion today (Mirena).  The following portions of the patient's history were reviewed and updated as appropriate: allergies, current medications, past family history, past medical history, past social history, past surgical history, and problem list.  Review of Systems Pertinent items are noted in HPI.  Pap normal 08/2020  Objective:   Blood pressure (!) 135/103, pulse 76, height 5' (1.524 m), weight 180 lb (81.6 kg), last menstrual period 01/23/2023, not currently breastfeeding. Body mass index is 35.15 kg/m. Well nourished, well hydrated White female, no apparent distress She is ambulating and conversing normally.  UPT negative, consent signed, Time out procedure done. Bimanual exam revealed a uterus NSSA, no adnexal masses or tenderness Cervix prepped with betadine and Hurricaine spray and then grasped with a single tooth tenaculum. Mirena was easily placed and the strings were cut to 3-4 cm. Uterus sounded to 8 cm. She tolerated the procedure well.    Assessment:   1. Encounter for IUD insertion      Plan:   1. Encounter for IUD insertion  - POCT urine pregnancy - levonorgestrel (MIRENA) 20 MCG/DAY IUD 1 each    2. Elevated BPs, h/o CHTN with pregnancy  - She will see her Primary care provider next week.

## 2023-02-08 ENCOUNTER — Encounter: Payer: Self-pay | Admitting: Internal Medicine

## 2023-02-08 ENCOUNTER — Ambulatory Visit (INDEPENDENT_AMBULATORY_CARE_PROVIDER_SITE_OTHER): Payer: BC Managed Care – PPO | Admitting: Internal Medicine

## 2023-02-08 VITALS — BP 128/89 | HR 100 | Ht 60.0 in | Wt 181.0 lb

## 2023-02-08 DIAGNOSIS — E66812 Obesity, class 2: Secondary | ICD-10-CM

## 2023-02-08 DIAGNOSIS — I10 Essential (primary) hypertension: Secondary | ICD-10-CM

## 2023-02-08 DIAGNOSIS — Z6835 Body mass index (BMI) 35.0-35.9, adult: Secondary | ICD-10-CM

## 2023-02-08 MED ORDER — METOPROLOL SUCCINATE ER 50 MG PO TB24: 50.0000 mg | ORAL_TABLET | Freq: Every day | ORAL | 0 refills | Status: DC

## 2023-02-08 NOTE — Assessment & Plan Note (Signed)
She has [employer] which does not cover any weight loss medications She reports this with most likely be unaffordable for her without insurance coverage Advised her to google "calorie counting" and try to consume 1200 cal daily Encouraged diet high in protein and low in carbs Encouraged goal of 150 minutes of exercise weekly

## 2023-02-08 NOTE — Assessment & Plan Note (Signed)
Improved but not quite at goal of <130/80 We will plan to increase metoprolol to 50 mg XL daily Reinforced DASH diet and exercise for weight loss If not at goal at next visit, would consider adding olmesartan 20 mg daily

## 2023-02-08 NOTE — Progress Notes (Signed)
Subjective:    Patient ID: Tamara Mcclain, female    DOB: January 02, 2001, 22 y.o.   MRN: [id number]  HPI  Patient presents to clinic today for 2-week follow-up of HTN.  At her last visit her BP and heart rate were elevated.  She was started on metoprolol 25 mg daily.  She has been taking the medication as prescribed.  She reports her heart rate has decreased significantly and her headaches have improved.  Her BP today is 132/94.  ECG from 09/2021 reviewed.  She also would like to discuss weight loss medication.  Her weight today is 181 pounds with a BMI of 35.35.  She has had difficulty losing weight after the birth of her child 5 months ago.  She admits that she does not adhere to a balanced diet or exercise regimen.  Review of Systems     Past Medical History:  Diagnosis Date   Anxiety    Epiploic appendagitis    Kidney stones 04/13/2022   Right Flank   MTHFR mutation    Von Willebrand disease (HCC) Dr. Janee Morn at [facility]    Current Outpatient Medications  Medication Sig Dispense Refill   albuterol (VENTOLIN HFA) 108 (90 Base) MCG/ACT inhaler Inhale 1-2 puffs into the lungs every 6 (six) hours as needed for wheezing or shortness of breath. 8 g 1   ibuprofen (ADVIL) 600 MG tablet Take 1 tablet (600 mg total) by mouth every 6 (six) hours as needed. 30 tablet 0   levonorgestrel (MIRENA) 20 MCG/DAY IUD 1 each by Intrauterine route once.     metoprolol succinate (TOPROL-XL) 50 MG 24 hr tablet Take 1 tablet (50 mg total) by mouth daily. Take with or immediately following a meal. 90 tablet 0   No current facility-administered medications for this visit.    Allergies  Allergen Reactions   Tylenol [Acetaminophen] Other (See Comments)    fever   Depo-Medrol [Methylprednisolone] Nausea Only    Feeling flushed    Family History  Problem Relation Age of Onset   Hypertension Father    Pulmonary embolism Father    Cancer Maternal Grandmother 91       breast   Arthritis Maternal  Grandmother    Hyperlipidemia Maternal Grandmother    CAD Maternal Grandfather    Cancer Paternal Grandmother        melanoma, sinus cancer   Colon polyps Paternal Grandmother    Heart disease Paternal Grandfather    Gaucher's disease Half-Brother    Esophageal cancer Neg Hx    Stomach cancer Neg Hx    Liver disease Neg Hx     Social History   Socioeconomic History   Marital status: Married    Spouse name: Siniah Damico.   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  Tobacco Use   Smoking status: Never    Passive exposure: Past   Smokeless tobacco: Never  Vaping Use   Vaping status: Never Used  Substance and Sexual Activity   Alcohol use: No    Alcohol/week: 0.0 standard drinks of alcohol   Drug use: No   Sexual activity: Yes    Partners: Male    Birth control/protection: OCP  Other Topics Concern   Not on file  Social History Narrative   Not on file   Social Determinants of Health   Financial  Resource Strain: Low Risk  (10/23/2022)   Overall Financial Resource Strain (CARDIA)    Difficulty of Paying Living Expenses: Not hard at all  Food Insecurity: No Food Insecurity (10/23/2022)   Hunger Vital Sign    Worried About Running Out of Food in the Last Year: Never true    Ran Out of Food in the Last Year: Never true  Transportation Needs: No Transportation Needs (10/23/2022)   PRAPARE - Administrator, Civil Service (Medical): No    Lack of Transportation (Non-Medical): No  Physical Activity: Insufficiently Active (10/23/2022)   Exercise Vital Sign    Days of Exercise per Week: 2 days    Minutes of Exercise per Session: 20 min  Stress: No Stress Concern Present (10/23/2022)   [employer] of Occupational Health - Occupational Stress Questionnaire    Feeling of Stress : Only a little  Social Connections: Moderately  Integrated (10/23/2022)   Social Connection and Isolation Panel [NHANES]    Frequency of Communication with Friends and Family: More than three times a week    Frequency of Social Gatherings with Friends and Family: Twice a week    Attends Religious Services: 1 to 4 times per year    Active Member of [employer] or Organizations: No    Attends Banker Meetings: Not on file    Marital Status: Married  Catering manager Violence: Not At Risk (08/23/2022)   Humiliation, Afraid, Rape, and Kick questionnaire    Fear of Current or Ex-Partner: No    Emotionally Abused: No    Physically Abused: No    Sexually Abused: No     Constitutional: Patient reports intermittent headaches.  Denies fever, malaise, fatigue, or abrupt weight changes.  HEENT: Denies eye pain, eye redness, ear pain, ringing in the ears, wax buildup, runny nose, nasal congestion, bloody nose, or sore throat. Respiratory: Denies difficulty breathing, shortness of breath, cough or sputum production.   Cardiovascular: Denies chest pain, chest tightness, palpitations or swelling in the hands or feet.  Gastrointestinal: Denies abdominal pain, bloating, constipation, diarrhea or blood in the stool.  GU: Denies urgency, frequency, pain with urination, burning sensation, blood in urine, odor or discharge. Musculoskeletal: Denies decrease in range of motion, difficulty with gait, muscle pain or joint pain and swelling.  Skin: Denies redness, rashes, lesions or ulcercations.  Neurological: Denies dizziness, difficulty with memory, difficulty with speech or problems with balance and coordination.  Psych: Patient has a history of anxiety.  Denies depression, SI/HI.  No other specific complaints in a complete review of systems (except as listed in HPI above).  Objective:   Physical Exam  BP 128/89   Pulse 100   Ht 5' (1.524 m)   Wt 181 lb (82.1 kg)   LMP 01/23/2023 (Approximate)   SpO2 97%   BMI 35.35 kg/m  Wt Readings from  Last 3 Encounters:  02/08/23 181 lb (82.1 kg)  02/02/23 180 lb (81.6 kg)  01/25/23 180 lb (81.6 kg)    General: Appears her stated age, obese skull in NAD. Cardiovascular: Normal rate and rhythm. S1,S2 noted.  No murmur, rubs or gallops noted. No JVD or BLE edema.  Pulmonary/Chest: Normal effort and positive vesicular breath sounds. No respiratory distress. No wheezes, rales or ronchi noted.  Musculoskeletal:  No difficulty with gait.  Neurological: Alert and oriented.  Coordination normal.     BMET    Component Value Date/Time   NA 138 12/15/2022 1030   NA 136 01/28/2022 1047  K 4.3 12/15/2022 1030   CL 106 12/15/2022 1030   CO2 24 12/15/2022 1030   GLUCOSE 99 12/15/2022 1030   BUN 7 12/15/2022 1030   BUN 4 (L) 01/28/2022 1047   CREATININE 0.69 12/15/2022 1030   CALCIUM 9.2 12/15/2022 1030   GFRNONAA >60 12/15/2022 1030   GFRAA >60 04/09/2018 2151    Lipid Panel     Component Value Date/Time   CHOL 168 11/27/2021 1428   TRIG 145 11/27/2021 1428   HDL 32 (L) 11/27/2021 1428   CHOLHDL 5.3 (H) 11/27/2021 1428   LDLCALC 110 (H) 11/27/2021 1428    CBC    Component Value Date/Time   WBC 9.8 12/15/2022 1030   RBC 5.03 12/15/2022 1030   HGB 13.0 12/15/2022 1030   HGB 11.8 06/09/2022 0926   HCT 39.7 12/15/2022 1030   HCT 36.2 06/09/2022 0926   PLT 397 12/15/2022 1030   PLT 324 06/09/2022 0926   MCV 78.9 (L) 12/15/2022 1030   MCV 90 06/09/2022 0926   MCH 25.8 (L) 12/15/2022 1030   MCHC 32.7 12/15/2022 1030   RDW 13.5 12/15/2022 1030   RDW 13.4 06/09/2022 0926   LYMPHSABS 1.5 06/09/2022 0926   MONOABS 0.6 07/24/2021 1612   EOSABS 0.0 06/09/2022 0926   BASOSABS 0.0 06/09/2022 0926    Hgb A1C Lab Results  Component Value Date   HGBA1C 5.3 01/28/2022            Assessment & Plan:

## 2023-02-08 NOTE — Patient Instructions (Signed)
Calorie Counting for Weight Loss Calories are units of energy. Your body needs a certain number of calories from food to keep going throughout the day. When you eat or drink more calories than your body needs, your body stores the extra calories mostly as fat. When you eat or drink fewer calories than your body needs, your body burns fat to get the energy it needs. Calorie counting means keeping track of how many calories you eat and drink each day. Calorie counting can be helpful if you need to lose weight. If you eat fewer calories than your body needs, you should lose weight. Ask your health care provider what a healthy weight is for you. For calorie counting to work, you will need to eat the right number of calories each day to lose a healthy amount of weight per week. A dietitian can help you figure out how many calories you need in a day and will suggest ways to reach your calorie goal. A healthy amount of weight to lose each week is usually 1-2 lb (0.5-0.9 kg). This usually means that your daily calorie intake should be reduced by 500-750 calories. Eating 1,200-1,500 calories a day can help most women lose weight. Eating 1,500-1,800 calories a day can help most men lose weight. What do I need to know about calorie counting? Work with your health care provider or dietitian to determine how many calories you should get each day. To meet your daily calorie goal, you will need to: Find out how many calories are in each food that you would like to eat. Try to do this before you eat. Decide how much of the food you plan to eat. Keep a food log. Do this by writing down what you ate and how many calories it had. To successfully lose weight, it is important to balance calorie counting with a healthy lifestyle that includes regular activity. Where do I find calorie information?  The number of calories in a food can be found on a Nutrition Facts label. If a food does not have a Nutrition Facts label, try  to look up the calories online or ask your dietitian for help. Remember that calories are listed per serving. If you choose to have more than one serving of a food, you will have to multiply the calories per serving by the number of servings you plan to eat. For example, the label on a package of bread might say that a serving size is 1 slice and that there are 90 calories in a serving. If you eat 1 slice, you will have eaten 90 calories. If you eat 2 slices, you will have eaten 180 calories. How do I keep a food log? After each time that you eat, record the following in your food log as soon as possible: What you ate. Be sure to include toppings, sauces, and other extras on the food. How much you ate. This can be measured in cups, ounces, or number of items. How many calories were in each food and drink. The total number of calories in the food you ate. Keep your food log near you, such as in a pocket-sized notebook or on an app or website on your mobile phone. Some programs will calculate calories for you and show you how many calories you have left to meet your daily goal. What are some portion-control tips? Know how many calories are in a serving. This will help you know how many servings you can have of a certain   food. Use a measuring cup to measure serving sizes. You could also try weighing out portions on a kitchen scale. With time, you will be able to estimate serving sizes for some foods. Take time to put servings of different foods on your favorite plates or in your favorite bowls and cups so you know what a serving looks like. Try not to eat straight from a food's packaging, such as from a bag or box. Eating straight from the package makes it hard to see how much you are eating and can lead to overeating. Put the amount you would like to eat in a cup or on a plate to make sure you are eating the right portion. Use smaller plates, glasses, and bowls for smaller portions and to prevent  overeating. Try not to multitask. For example, avoid watching TV or using your computer while eating. If it is time to eat, sit down at a table and enjoy your food. This will help you recognize when you are full. It will also help you be more mindful of what and how much you are eating. What are tips for following this plan? Reading food labels Check the calorie count compared with the serving size. The serving size may be smaller than what you are used to eating. Check the source of the calories. Try to choose foods that are high in protein, fiber, and vitamins, and low in saturated fat, trans fat, and sodium. Shopping Read nutrition labels while you shop. This will help you make healthy decisions about which foods to buy. Pay attention to nutrition labels for low-fat or fat-free foods. These foods sometimes have the same number of calories or more calories than the full-fat versions. They also often have added sugar, starch, or salt to make up for flavor that was removed with the fat. Make a grocery list of lower-calorie foods and stick to it. Cooking Try to cook your favorite foods in a healthier way. For example, try baking instead of frying. Use low-fat dairy products. Meal planning Use more fruits and vegetables. One-half of your plate should be fruits and vegetables. Include lean proteins, such as chicken, turkey, and fish. Lifestyle Each week, aim to do one of the following: 150 minutes of moderate exercise, such as walking. 75 minutes of vigorous exercise, such as running. General information Know how many calories are in the foods you eat most often. This will help you calculate calorie counts faster. Find a way of tracking calories that works for you. Get creative. Try different apps or programs if writing down calories does not work for you. What foods should I eat?  Eat nutritious foods. It is better to have a nutritious, high-calorie food, such as an avocado, than a food with  few nutrients, such as a bag of potato chips. Use your calories on foods and drinks that will fill you up and will not leave you hungry soon after eating. Examples of foods that fill you up are nuts and nut butters, vegetables, lean proteins, and high-fiber foods such as whole grains. High-fiber foods are foods with more than 5 g of fiber per serving. Pay attention to calories in drinks. Low-calorie drinks include water and unsweetened drinks. The items listed above may not be a complete list of foods and beverages you can eat. Contact a dietitian for more information. What foods should I limit? Limit foods or drinks that are not good sources of vitamins, minerals, or protein or that are high in unhealthy fats. These   include: Candy. Other sweets. Sodas, specialty coffee drinks, alcohol, and juice. The items listed above may not be a complete list of foods and beverages you should avoid. Contact a dietitian for more information. How do I count calories when eating out? Pay attention to portions. Often, portions are much larger when eating out. Try these tips to keep portions smaller: Consider sharing a meal instead of getting your own. If you get your own meal, eat only half of it. Before you start eating, ask for a container and put half of your meal into it. When available, consider ordering smaller portions from the menu instead of full portions. Pay attention to your food and drink choices. Knowing the way food is cooked and what is included with the meal can help you eat fewer calories. If calories are listed on the menu, choose the lower-calorie options. Choose dishes that include vegetables, fruits, whole grains, low-fat dairy products, and lean proteins. Choose items that are boiled, broiled, grilled, or steamed. Avoid items that are buttered, battered, fried, or served with cream sauce. Items labeled as crispy are usually fried, unless stated otherwise. Choose water, low-fat milk,  unsweetened iced tea, or other drinks without added sugar. If you want an alcoholic beverage, choose a lower-calorie option, such as a glass of wine or light beer. Ask for dressings, sauces, and syrups on the side. These are usually high in calories, so you should limit the amount you eat. If you want a salad, choose a garden salad and ask for grilled meats. Avoid extra toppings such as bacon, cheese, or fried items. Ask for the dressing on the side, or ask for olive oil and vinegar or lemon to use as dressing. Estimate how many servings of a food you are given. Knowing serving sizes will help you be aware of how much food you are eating at restaurants. Where to find more information Centers for Disease Control and Prevention: www.cdc.gov [place] Department of Agriculture: myplate.gov Summary Calorie counting means keeping track of how many calories you eat and drink each day. If you eat fewer calories than your body needs, you should lose weight. A healthy amount of weight to lose per week is usually 1-2 lb (0.5-0.9 kg). This usually means reducing your daily calorie intake by 500-750 calories. The number of calories in a food can be found on a Nutrition Facts label. If a food does not have a Nutrition Facts label, try to look up the calories online or ask your dietitian for help. Use smaller plates, glasses, and bowls for smaller portions and to prevent overeating. Use your calories on foods and drinks that will fill you up and not leave you hungry shortly after a meal. This information is not intended to replace advice given to you by your health care provider. Make sure you discuss any questions you have with your health care provider. Document Revised: 06/08/2019 Document Reviewed: 06/08/2019 Elsevier Patient Education  2023 [employer] Inc.  

## 2023-02-16 ENCOUNTER — Other Ambulatory Visit: Payer: Self-pay | Admitting: Orthopedic Surgery

## 2023-02-16 DIAGNOSIS — M5431 Sciatica, right side: Secondary | ICD-10-CM

## 2023-02-17 ENCOUNTER — Ambulatory Visit
Admission: RE | Admit: 2023-02-17 | Discharge: 2023-02-17 | Disposition: A | Discharge status: Home / Self Care | Payer: BC Managed Care – PPO | Source: Ambulatory Visit | Attending: Orthopedic Surgery | Admitting: Orthopedic Surgery

## 2023-02-17 DIAGNOSIS — M4186 Other forms of scoliosis, lumbar region: Secondary | ICD-10-CM | POA: Diagnosis not present

## 2023-02-17 DIAGNOSIS — M47816 Spondylosis without myelopathy or radiculopathy, lumbar region: Secondary | ICD-10-CM | POA: Diagnosis not present

## 2023-02-17 DIAGNOSIS — M5126 Other intervertebral disc displacement, lumbar region: Secondary | ICD-10-CM | POA: Diagnosis not present

## 2023-02-17 DIAGNOSIS — M5431 Sciatica, right side: Secondary | ICD-10-CM | POA: Insufficient documentation

## 2023-02-17 DIAGNOSIS — M5136 Other intervertebral disc degeneration, lumbar region with discogenic back pain only: Secondary | ICD-10-CM | POA: Diagnosis not present

## 2023-02-24 ENCOUNTER — Encounter: Payer: Self-pay | Admitting: Internal Medicine

## 2023-02-24 ENCOUNTER — Ambulatory Visit (INDEPENDENT_AMBULATORY_CARE_PROVIDER_SITE_OTHER): Payer: BC Managed Care – PPO | Admitting: Internal Medicine

## 2023-02-24 VITALS — BP 130/86 | HR 98 | Ht 60.0 in | Wt 185.0 lb

## 2023-02-24 DIAGNOSIS — E66812 Obesity, class 2: Secondary | ICD-10-CM | POA: Diagnosis not present

## 2023-02-24 DIAGNOSIS — R590 Localized enlarged lymph nodes: Secondary | ICD-10-CM | POA: Diagnosis not present

## 2023-02-24 DIAGNOSIS — I10 Essential (primary) hypertension: Secondary | ICD-10-CM

## 2023-02-24 DIAGNOSIS — Z6836 Body mass index (BMI) 36.0-36.9, adult: Secondary | ICD-10-CM

## 2023-02-24 NOTE — Patient Instructions (Signed)
Lymphadenopathy  Lymphadenopathy is when your lymph glands are swollen or larger than normal.  Lymph glands, also called lymph nodes, are clumps of tissue. They filter germs and waste from tissues in your body to your bloodstream. They're part of your body's defense system, or immune system. Lymphadenopathy has different causes, like infection, autoimmune disease, and cancer. Lymphadenopathy can happen wherever you have lymph nodes. The type you have depends on which nodes it's in, such as: Cervical lymphadenopathy. This is in the neck. Mediastinal lymphadenopathy. This is in the chest. Hilar lymphadenopathy. This is in the lungs. Axillary lymphadenopathy. This is in the armpits. Inguinal lymphadenopathy. This is in the groin. Sometimes, fluid and cells that fight infection build up in your lymph nodes. This happens when your immune system reacts to germs or other substances that get into your body. This makes lymph nodes swell and get bigger. Treatment is based on what's thought to be the cause. Sometimes, lymph nodes don't go back to normal size after treatment. If yours don't, your health care provider may order tests to help learn why your glands are still swollen and big. Follow these instructions at home:  Take over-the-counter and prescription medicines only as told by your provider. If you were prescribed antibiotics, do not stop using them, even if you start to feel better. If told, apply heat to swollen lymph nodes as told by your provider. Use the heat source that your provider recommends, such as a moist heat pack or a heating pad. Place a towel between your skin and the heat source. Leave the heat on only for the time told by your provider to avoid injury. If your skin turns bright red, remove the heat right away to prevent burns. The risk of burns is higher if you cannot feel pain, heat, or cold. Check your swollen lymph nodes every day for changes. Check other places where you have  lymph nodes as told. Check for changes such as: More swelling. Sudden growth in size. Redness or pain. Hardness. Contact a health care provider if: You have lymph nodes that: Are still swollen after 2 weeks. Have gotten bigger all of a sudden or the swelling spreads. Are red, painful, or hard. Fluid leaks from the skin near a swollen lymph node. You get a fever, chills, or night sweats. You feel tired. You have a sore throat. Your abdomen hurts. You lose weight without trying. This information is not intended to replace advice given to you by your health care provider. Make sure you discuss any questions you have with your health care provider. Document Revised: 07/22/2022 Document Reviewed: 07/22/2022 Elsevier Patient Education  2024 [employer].

## 2023-02-24 NOTE — Assessment & Plan Note (Signed)
Controlled on metoprolol Reinforced DASH diet and exercise for weight loss

## 2023-02-24 NOTE — Assessment & Plan Note (Signed)
Encouraged diet and exercise for weight loss ?

## 2023-02-24 NOTE — Progress Notes (Signed)
Subjective:    Patient ID: Tamara Mcclain, female    DOB: 06/08/2000, 22 y.o.   MRN: [id number]  HPI  Patient presents to clinic today for 2-week follow-up of HTN.  At her last visit, her metoprolol was increased to 50 mg daily.  She has been taking the medication as prescribed.  Her BP today is 133/90.  ECG from 09/2021 reviewed.  She also reports a mass in her left armpit.  She noticed this 2 days ago.  She reports the area is intermittently sore to touch.  She has not noticed any redness or drainage from the area.  She denies fever, chills, nausea or vomiting.  She has not tried anything OTC for this.  She does admit that she got her flu shot in this arm just prior to the onset of the swelling.  Review of Systems     Past Medical History:  Diagnosis Date   Anxiety    Epiploic appendagitis    Kidney stones 04/13/2022   Right Flank   MTHFR mutation    Von Willebrand disease (HCC) Dr. Janee Morn at [facility]    Current Outpatient Medications  Medication Sig Dispense Refill   albuterol (VENTOLIN HFA) 108 (90 Base) MCG/ACT inhaler Inhale 1-2 puffs into the lungs every 6 (six) hours as needed for wheezing or shortness of breath. 8 g 1   ibuprofen (ADVIL) 600 MG tablet Take 1 tablet (600 mg total) by mouth every 6 (six) hours as needed. 30 tablet 0   levonorgestrel (MIRENA) 20 MCG/DAY IUD 1 each by Intrauterine route once.     metoprolol succinate (TOPROL-XL) 50 MG 24 hr tablet Take 1 tablet (50 mg total) by mouth daily. Take with or immediately following a meal. 90 tablet 0   No current facility-administered medications for this visit.    Allergies  Allergen Reactions   Tylenol [Acetaminophen] Other (See Comments)    fever   Depo-Medrol [Methylprednisolone] Nausea Only    Feeling flushed    Family History  Problem Relation Age of Onset   Hypertension Father    Pulmonary embolism Father    Cancer Maternal Grandmother 53       breast   Arthritis Maternal Grandmother     Hyperlipidemia Maternal Grandmother    CAD Maternal Grandfather    Cancer Paternal Grandmother        melanoma, sinus cancer   Colon polyps Paternal Grandmother    Heart disease Paternal Grandfather    Gaucher's disease Half-Brother    Esophageal cancer Neg Hx    Stomach cancer Neg Hx    Liver disease Neg Hx     Social History   Socioeconomic History   Marital status: Married    Spouse name: Onia Thacker.   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  Tobacco Use   Smoking status: Never    Passive exposure: Past   Smokeless tobacco: Never  Vaping Use   Vaping status: Never Used  Substance and Sexual Activity   Alcohol use: No    Alcohol/week: 0.0 standard drinks of alcohol   Drug use: No   Sexual activity: Yes    Partners: Male    Birth control/protection: OCP  Other Topics Concern   Not on file  Social History Narrative   Not on file   Social Determinants of Health  Financial Resource Strain: Low Risk  (10/23/2022)   Overall Financial Resource Strain (CARDIA)    Difficulty of Paying Living Expenses: Not hard at all  Food Insecurity: No Food Insecurity (10/23/2022)   Hunger Vital Sign    Worried About Running Out of Food in the Last Year: Never true    Ran Out of Food in the Last Year: Never true  Transportation Needs: No Transportation Needs (10/23/2022)   PRAPARE - Administrator, Civil Service (Medical): No    Lack of Transportation (Non-Medical): No  Physical Activity: Insufficiently Active (10/23/2022)   Exercise Vital Sign    Days of Exercise per Week: 2 days    Minutes of Exercise per Session: 20 min  Stress: No Stress Concern Present (10/23/2022)   [employer] of Occupational Health - Occupational Stress Questionnaire    Feeling of Stress : Only a little  Social Connections: Moderately Integrated  (10/23/2022)   Social Connection and Isolation Panel [NHANES]    Frequency of Communication with Friends and Family: More than three times a week    Frequency of Social Gatherings with Friends and Family: Twice a week    Attends Religious Services: 1 to 4 times per year    Active Member of [employer] or Organizations: No    Attends Engineer, structural: Not on file    Marital Status: Married  Catering manager Violence: Not At Risk (08/23/2022)   Humiliation, Afraid, Rape, and Kick questionnaire    Fear of Current or Ex-Partner: No    Emotionally Abused: No    Physically Abused: No    Sexually Abused: No     Constitutional: Denies fever, malaise, fatigue, headache or abrupt weight changes.  Respiratory: Denies difficulty breathing, shortness of breath, cough or sputum production.   Cardiovascular: Denies chest pain, chest tightness, palpitations or swelling in the hands or feet.  Skin: Patient reports mass of left axilla.  Denies redness, rashes, lesions or ulcercations.  Neurological: Denies dizziness, difficulty with memory, difficulty with speech or problems with balance and coordination.    No other specific complaints in a complete review of systems (except as listed in HPI above).  Objective:   Physical Exam  BP 130/86   Pulse 98   Ht 5' (1.524 m)   Wt 185 lb (83.9 kg)   LMP 01/23/2023 (Approximate)   SpO2 100%   BMI 36.13 kg/m   Wt Readings from Last 3 Encounters:  02/08/23 181 lb (82.1 kg)  02/02/23 180 lb (81.6 kg)  01/25/23 180 lb (81.6 kg)    General: Appears her stated age, obese, in NAD. Skin: Lymphadenopathy noted in the left axilla. Cardiovascular: Normal rate and rhythm. S1,S2 noted.  No murmur, rubs or gallops noted.  Pulmonary/Chest: Normal effort and positive vesicular breath sounds. No respiratory distress. No wheezes, rales or ronchi noted.  Neurological: Alert and oriented.   BMET    Component Value Date/Time   NA 138 12/15/2022 1030   NA  136 01/28/2022 1047   K 4.3 12/15/2022 1030   CL 106 12/15/2022 1030   CO2 24 12/15/2022 1030   GLUCOSE 99 12/15/2022 1030   BUN 7 12/15/2022 1030   BUN 4 (L) 01/28/2022 1047   CREATININE 0.69 12/15/2022 1030   CALCIUM 9.2 12/15/2022 1030   GFRNONAA >60 12/15/2022 1030   GFRAA >60 04/09/2018 2151    Lipid Panel     Component Value Date/Time   CHOL 168 11/27/2021 1428   TRIG 145 11/27/2021 1428  HDL 32 (L) 11/27/2021 1428   CHOLHDL 5.3 (H) 11/27/2021 1428   LDLCALC 110 (H) 11/27/2021 1428    CBC    Component Value Date/Time   WBC 9.8 12/15/2022 1030   RBC 5.03 12/15/2022 1030   HGB 13.0 12/15/2022 1030   HGB 11.8 06/09/2022 0926   HCT 39.7 12/15/2022 1030   HCT 36.2 06/09/2022 0926   PLT 397 12/15/2022 1030   PLT 324 06/09/2022 0926   MCV 78.9 (L) 12/15/2022 1030   MCV 90 06/09/2022 0926   MCH 25.8 (L) 12/15/2022 1030   MCHC 32.7 12/15/2022 1030   RDW 13.5 12/15/2022 1030   RDW 13.4 06/09/2022 0926   LYMPHSABS 1.5 06/09/2022 0926   MONOABS 0.6 07/24/2021 1612   EOSABS 0.0 06/09/2022 0926   BASOSABS 0.0 06/09/2022 0926    Hgb A1C Lab Results  Component Value Date   HGBA1C 5.3 01/28/2022            Assessment & Plan:   Lymphadenopathy left axilla:  Likely reactive due to recent flu shot Advised her to take ibuprofen 400 to 600 mg every 8 hours as needed for pain If persist, let me know and we could obtain ultrasound of the left axilla  RTC in 2 months for annual exam Nicki Reaper, NP

## 2023-02-26 ENCOUNTER — Ambulatory Visit: Payer: BC Managed Care – PPO | Admitting: Internal Medicine

## 2023-02-26 ENCOUNTER — Encounter: Payer: Self-pay | Admitting: Internal Medicine

## 2023-02-26 VITALS — BP 128/86 | HR 109 | Temp 98.7°F | Ht 60.0 in | Wt 185.0 lb

## 2023-02-26 DIAGNOSIS — R509 Fever, unspecified: Secondary | ICD-10-CM

## 2023-02-26 DIAGNOSIS — H6502 Acute serous otitis media, left ear: Secondary | ICD-10-CM

## 2023-02-26 LAB — POC SOFIA 2 FLU + SARS ANTIGEN FIA
Influenza A, POC: NEGATIVE
Influenza B, POC: NEGATIVE
SARS Coronavirus 2 Ag: NEGATIVE

## 2023-02-26 MED ORDER — AMOXICILLIN-POT CLAVULANATE 875-125 MG PO TABS
1.0000 | ORAL_TABLET | Freq: Two times a day (BID) | ORAL | 0 refills | Status: DC
Start: 2023-02-26 — End: 2023-03-10

## 2023-02-26 NOTE — Progress Notes (Signed)
Subjective:    Patient ID: Tamara Mcclain, female    DOB: 03-16-2001, 22 y.o.   MRN: [id number]  HPI  Discussed the use of AI scribe software for clinical note transcription with the patient, who gave verbal consent to proceed.   The patient presented with an acute onset of fever that started in the middle of the night. They reported waking up at approximately 1:30 AM feeling extremely cold and noted a temperature of 102.49F. Despite attempts to rest, the patient woke up again two hours later with a higher temperature of 103.69F. They spent the rest of the morning resting and reported feeling fatigued, which they attributed to lack of sleep.  The patient also reported a headache, which they associated with high blood pressure. They took their blood pressure medication as usual in the morning and checked their blood pressure approximately two hours later, which was elevated at 166/98 mmHg with a heart rate of 114 bpm. An hour later, their blood pressure was still high at 128/105 mmHg with a heart rate of 130 bpm.  The patient denied any other symptoms such as runny nose, nasal congestion, ear pain, sore throat, cough, shortness of breath, nausea, vomiting, or diarrhea. They did mention a recent cold and occasional stuffiness. They also reported that their infant child has had a persistent cough for several months.  The patient has a known allergy to Tylenol and did not take any ibuprofen for their symptoms. They reported no urinary symptoms. They also mentioned that they had a COVID test done recently.       Review of Systems     Past Medical History:  Diagnosis Date   Anxiety    Epiploic appendagitis    Kidney stones 04/13/2022   Right Flank   MTHFR mutation    Von Willebrand disease (HCC) Dr. Janee Morn at [facility]    Current Outpatient Medications  Medication Sig Dispense Refill   albuterol (VENTOLIN HFA) 108 (90 Base) MCG/ACT inhaler Inhale 1-2 puffs into the lungs every 6 (six)  hours as needed for wheezing or shortness of breath. 8 g 1   ibuprofen (ADVIL) 600 MG tablet Take 1 tablet (600 mg total) by mouth every 6 (six) hours as needed. 30 tablet 0   levonorgestrel (MIRENA) 20 MCG/DAY IUD 1 each by Intrauterine route once.     metoprolol succinate (TOPROL-XL) 50 MG 24 hr tablet Take 1 tablet (50 mg total) by mouth daily. Take with or immediately following a meal. 90 tablet 0   No current facility-administered medications for this visit.    Allergies  Allergen Reactions   Tylenol [Acetaminophen] Other (See Comments)    fever   Depo-Medrol [Methylprednisolone] Nausea Only    Feeling flushed    Family History  Problem Relation Age of Onset   Hypertension Father    Pulmonary embolism Father    Cancer Maternal Grandmother 64       breast   Arthritis Maternal Grandmother    Hyperlipidemia Maternal Grandmother    CAD Maternal Grandfather    Cancer Paternal Grandmother        melanoma, sinus cancer   Colon polyps Paternal Grandmother    Heart disease Paternal Grandfather    Gaucher's disease Half-Brother    Esophageal cancer Neg Hx    Stomach cancer Neg Hx    Liver disease Neg Hx     Social History   Socioeconomic History   Marital status: Married    Spouse name: Markella Lamphier.  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 [employer]  Tobacco Use   Smoking status: Never    Passive exposure: Past   Smokeless tobacco: Never  Vaping Use   Vaping status: Never Used  Substance and Sexual Activity   Alcohol use: No    Alcohol/week: 0.0 standard drinks of alcohol   Drug use: No   Sexual activity: Yes    Partners: Male    Birth control/protection: OCP  Other Topics Concern   Not on file  Social History Narrative   Not on file   Social Determinants of Health   Financial Resource Strain: Low Risk  (10/23/2022)   Overall  Financial Resource Strain (CARDIA)    Difficulty of Paying Living Expenses: Not hard at all  Food Insecurity: No Food Insecurity (10/23/2022)   Hunger Vital Sign    Worried About Running Out of Food in the Last Year: Never true    Ran Out of Food in the Last Year: Never true  Transportation Needs: No Transportation Needs (10/23/2022)   PRAPARE - Administrator, Civil Service (Medical): No    Lack of Transportation (Non-Medical): No  Physical Activity: Insufficiently Active (10/23/2022)   Exercise Vital Sign    Days of Exercise per Week: 2 days    Minutes of Exercise per Session: 20 min  Stress: No Stress Concern Present (10/23/2022)   [employer] of Occupational Health - Occupational Stress Questionnaire    Feeling of Stress : Only a little  Social Connections: Moderately Integrated (10/23/2022)   Social Connection and Isolation Panel [NHANES]    Frequency of Communication with Friends and Family: More than three times a week    Frequency of Social Gatherings with Friends and Family: Twice a week    Attends Religious Services: 1 to 4 times per year    Active Member of [employer] or Organizations: No    Attends Banker Meetings: Not on file    Marital Status: Married  Catering manager Violence: Not At Risk (08/23/2022)   Humiliation, Afraid, Rape, and Kick questionnaire    Fear of Current or Ex-Partner: No    Emotionally Abused: No    Physically Abused: No    Sexually Abused: No     Constitutional: Patient reports fever, headache.  Denies malaise, fatigue, headache or abrupt weight changes.  HEENT: Patient reports nasal congestion.  Denies eye pain, eye redness, ear pain, ringing in the ears, wax buildup, runny nose, bloody nose, or sore throat. Respiratory: Denies difficulty breathing, shortness of breath, cough or sputum production.   Cardiovascular: Denies chest pain, chest tightness, palpitations or swelling in the hands or feet.  Gastrointestinal: Denies  abdominal pain, bloating, constipation, diarrhea or blood in the stool.  GU: Denies urgency, frequency, pain with urination, burning sensation, blood in urine, odor or discharge. Musculoskeletal: Denies decrease in range of motion, difficulty with gait, muscle pain or joint pain and swelling.  Skin: Denies redness, rashes, lesions or ulcercations.  Neurological: Denies dizziness, difficulty with memory, difficulty with speech or problems with balance and coordination.    No other specific complaints in a complete review of systems (except as listed in HPI above).  Objective:   Physical Exam  BP 128/86   Pulse (!) 109   Temp 98.7 F (37.1 C)   Ht 5' (1.524 m)   Wt 185  lb (83.9 kg)   LMP 01/23/2023 (Approximate)   BMI 36.13 kg/m   Wt Readings from Last 3 Encounters:  02/24/23 185 lb (83.9 kg)  02/08/23 181 lb (82.1 kg)  02/02/23 180 lb (81.6 kg)    General: Appears her stated age, obese in NAD. Skin: Warm, dry and intact. No rashes noted. HEENT: Head: normal shape and size, no sinus tenderness noted; Eyes: sclera white, no icterus, conjunctiva Mcclain, PERRLA and EOMs intact; Left Ear: Tm's red and bulging, normal light reflex, + serous effusion; Nose: mucosa Mcclain and moist, septum midline; Throat/Mouth: Teeth present, mucosa Mcclain and moist, no exudate, lesions or ulcerations noted.  Neck: No adenopathy noted. Cardiovascular: Tach with normal rhythm. S1,S2 noted.  No murmur, rubs or gallops noted.  Pulmonary/Chest: Normal effort and positive vesicular breath sounds. No respiratory distress. No wheezes, rales or ronchi noted.  Musculoskeletal:  No difficulty with gait.  Neurological: Alert and oriented. Coordination normal.    BMET    Component Value Date/Time   NA 138 12/15/2022 1030   NA 136 01/28/2022 1047   K 4.3 12/15/2022 1030   CL 106 12/15/2022 1030   CO2 24 12/15/2022 1030   GLUCOSE 99 12/15/2022 1030   BUN 7 12/15/2022 1030   BUN 4 (L) 01/28/2022 1047   CREATININE  0.69 12/15/2022 1030   CALCIUM 9.2 12/15/2022 1030   GFRNONAA >60 12/15/2022 1030   GFRAA >60 04/09/2018 2151    Lipid Panel     Component Value Date/Time   CHOL 168 11/27/2021 1428   TRIG 145 11/27/2021 1428   HDL 32 (L) 11/27/2021 1428   CHOLHDL 5.3 (H) 11/27/2021 1428   LDLCALC 110 (H) 11/27/2021 1428    CBC    Component Value Date/Time   WBC 9.8 12/15/2022 1030   RBC 5.03 12/15/2022 1030   HGB 13.0 12/15/2022 1030   HGB 11.8 06/09/2022 0926   HCT 39.7 12/15/2022 1030   HCT 36.2 06/09/2022 0926   PLT 397 12/15/2022 1030   PLT 324 06/09/2022 0926   MCV 78.9 (L) 12/15/2022 1030   MCV 90 06/09/2022 0926   MCH 25.8 (L) 12/15/2022 1030   MCHC 32.7 12/15/2022 1030   RDW 13.5 12/15/2022 1030   RDW 13.4 06/09/2022 0926   LYMPHSABS 1.5 06/09/2022 0926   MONOABS 0.6 07/24/2021 1612   EOSABS 0.0 06/09/2022 0926   BASOSABS 0.0 06/09/2022 0926    Hgb A1C Lab Results  Component Value Date   HGBA1C 5.3 01/28/2022            Assessment & Plan:   Assessment and Plan    Left Ear Infection Fever and left ear erythema and bulging on exam. No respiratory or urinary symptoms. Negative COVID and flu tests. -Start Augmentin 875/125mg  twice daily for 10 days. -Encouraged hydration and rest. -Advised to seek urgent care if symptoms worsen over the weekend. -Return to clinic if not improved by Monday.  Hypertension Elevated blood pressure readings at home. Currently on unspecified blood pressure medication. -Continue current antihypertensive regimen. -Avoid ibuprofen due to potential for increasing blood pressure. -Encouraged to monitor blood pressure at home and report any persistently elevated readings.        RTC in 2 months for annual exam Nicki Reaper, NP

## 2023-02-26 NOTE — Patient Instructions (Signed)
Otitis Media, Adult    Otitis media is a condition in which the middle ear is red and swollen (inflamed) and full of fluid. The middle ear is the part of the ear that contains bones for hearing as well as air that helps send sounds to the brain. The condition usually goes away on its own.  What are the causes?  This condition is caused by a blockage in the eustachian tube. This tube connects the middle ear to the back of the nose. It normally allows air into the middle ear. The blockage is caused by fluid or swelling. Problems that can cause blockage include:  A cold or infection that affects the nose, mouth, or throat.  Allergies.  An irritant, such as tobacco smoke.  Adenoids that have become large. The adenoids are soft tissue located in the back of the throat, behind the nose and the roof of the mouth.  Growth or swelling in the upper part of the throat, just behind the nose (nasopharynx).  Damage to the ear caused by a change in pressure. This is called barotrauma.  What increases the risk?  You are more likely to develop this condition if you:  Smoke or are exposed to tobacco smoke.  Have an opening in the roof of your mouth (cleft palate).  Have acid reflux.  Have problems in your body's defense system (immune system).  What are the signs or symptoms?  Symptoms of this condition include:  Ear pain.  Fever.  Problems with hearing.  Being tired.  Fluid leaking from the ear.  Ringing in the ear.  How is this treated?  This condition can go away on its own within 3-5 days. But if the condition is caused by germs (bacteria) and does not go away on its own, or if it keeps coming back, your doctor may:  Give you antibiotic medicines.  Give you medicines for pain.  Follow these instructions at home:  Take over-the-counter and prescription medicines only as told by your doctor.  If you were prescribed an antibiotic medicine, take it as told by your doctor. Do not stop taking it even if you start to feel better.  Keep  all follow-up visits.  Contact a doctor if:  You have bleeding from your nose.  There is a lump on your neck.  You are not feeling better in 5 days.  You feel worse instead of better.  Get help right away if:  You have pain that is not helped with medicine.  You have swelling, redness, or pain around your ear.  You get a stiff neck.  You cannot move part of your face (paralysis).  You notice that the bone behind your ear hurts when you touch it.  You get a very bad headache.  Summary  Otitis media means that the middle ear is red, swollen, and full of fluid.  This condition usually goes away on its own.  If the problem does not go away, treatment may be needed. You may be given medicines to treat the infection or to treat your pain.  If you were prescribed an antibiotic medicine, take it as told by your doctor. Do not stop taking it even if you start to feel better.  Keep all follow-up visits.  This information is not intended to replace advice given to you by your health care provider. Make sure you discuss any questions you have with your health care provider.  Document Revised: 08/05/2020 Document Reviewed: 08/05/2020  Elsevier  Patient Education  2024 [employer].

## 2023-03-01 ENCOUNTER — Ambulatory Visit: Payer: BC Managed Care – PPO | Admitting: Internal Medicine

## 2023-03-01 NOTE — H&P (View-Only) (Signed)
Referring Physician:  Lorre Munroe, NP [address] [place],  [place] [address]  Primary Physician:  Lorre Munroe, NP  History of Present Illness: 03/01/2023 [name] is here today with a chief complaint of chronic lower back and hip pain that has been ongoing for approximately three years. The pain is primarily located in the lower back and buttock region, radiating down to the hip. The patient has severe pain in the buttock and extending down the leg, and numbness below the knee on the posterior lateral aspect and on the lateral aspect of the foot extending towards the toes.  The patient has noticed significant weakness in the affected leg. The pain is exacerbated by walking and standing in one position, particularly when arching the back. The patient reports being able to walk a short distance, approximately the length of a parking lot, before needing to stop due to leg pain.  The patient has attempted physical therapy for a month, but reports that it exacerbated the pain. The patient has been managing the pain at home, but it has been progressively worsening, particularly after childbirth 6 months ago.   Bowel/Bladder Dysfunction: none  Conservative measures: TENS unit, seen a chiropractor Physical therapy: has participated in at [facility] without relief Multimodal medical therapy including regular antiinflammatories: ibuprofen Injections: has not received epidural steroid injections, she is allergic to injections  Past Surgery: no previous spinal surgeries   Tamara Mcclain has no symptoms of cervical myelopathy.  The symptoms are causing a significant impact on the patient's life.   I have utilized the care everywhere function in epic to review the outside records available from external health systems.  Review of Systems:  A 10 point review of systems is negative, except for the pertinent positives and negatives detailed in the HPI.  Past Medical  History: Past Medical History:  Diagnosis Date   Anxiety    Epiploic appendagitis    Kidney stones 04/13/2022   Right Flank   MTHFR mutation    Von Willebrand disease (HCC) Dr. Janee Morn at [facility]    Past Surgical History: Past Surgical History:  Procedure Laterality Date   ESOPHAGOGASTRODUODENOSCOPY (EGD) WITH PROPOFOL N/A 01/30/2020   Procedure: ESOPHAGOGASTRODUODENOSCOPY (EGD) WITH PROPOFOL;  Surgeon: Wyline Mood, MD;  Location: [facility] ENDOSCOPY;  Service: Gastroenterology;  Laterality: N/A;   WISDOM TOOTH EXTRACTION  08/2018   no bleeding complications    Allergies: Allergies as of 03/02/2023 - Review Complete 02/26/2023  Allergen Reaction Noted   Tylenol [acetaminophen] Other (See Comments) 08/18/2016   Depo-medrol [methylprednisolone] Nausea Only 10/24/2019    Medications:  Current Outpatient Medications:    albuterol (VENTOLIN HFA) 108 (90 Base) MCG/ACT inhaler, Inhale 1-2 puffs into the lungs every 6 (six) hours as needed for wheezing or shortness of breath., Disp: 8 g, Rfl: 1   amoxicillin-clavulanate (AUGMENTIN) 875-125 MG tablet, Take 1 tablet by mouth 2 (two) times daily., Disp: 20 tablet, Rfl: 0   ibuprofen (ADVIL) 600 MG tablet, Take 1 tablet (600 mg total) by mouth every 6 (six) hours as needed., Disp: 30 tablet, Rfl: 0   levonorgestrel (MIRENA) 20 MCG/DAY IUD, 1 each by Intrauterine route once., Disp: , Rfl:    metoprolol succinate (TOPROL-XL) 50 MG 24 hr tablet, Take 1 tablet (50 mg total) by mouth daily. Take with or immediately following a meal., Disp: 90 tablet, Rfl: 0  Social History: Social History   Tobacco Use   Smoking status: Never    Passive  exposure: Past   Smokeless tobacco: Never  Vaping Use   Vaping status: Never Used  Substance Use Topics   Alcohol use: No    Alcohol/week: 0.0 standard drinks of alcohol   Drug use: No    Family Medical History: Family History  Problem Relation Age of Onset   Hypertension Father    Pulmonary embolism  Father    Cancer Maternal Grandmother 34       breast   Arthritis Maternal Grandmother    Hyperlipidemia Maternal Grandmother    CAD Maternal Grandfather    Cancer Paternal Grandmother        melanoma, sinus cancer   Colon polyps Paternal Grandmother    Heart disease Paternal Grandfather    Gaucher's disease Half-Brother    Esophageal cancer Neg Hx    Stomach cancer Neg Hx    Liver disease Neg Hx     Physical Examination: There were no vitals filed for this visit.  General: Patient is in no apparent distress. Attention to examination is appropriate.  Neck:   Supple.  Full range of motion.  Respiratory: Patient is breathing without any difficulty.   NEUROLOGICAL:     Awake, alert, oriented to person, place, and time.  Speech is clear and fluent.   Cranial Nerves: Pupils equal round and reactive to light.  Facial tone is symmetric.  Facial sensation is symmetric. Shoulder shrug is symmetric. Tongue protrusion is midline.  There is no pronator drift.  Strength: Side Biceps Triceps Deltoid Interossei Grip Wrist Ext. Wrist Flex.  R 5 5 5 5 5 5 5   L 5 5 5 5 5 5 5    Side Iliopsoas Quads Hamstring PF DF EHL  R 5 5 5 3 5 5   L 5 5 5 5 5 5    Reflexes are 1+ and symmetric at the biceps, triceps, brachioradialis, patella and achilles.   Hoffman's is absent.   Bilateral upper and lower extremity sensation is intact to light touch with exception of right S1 distribution which shows diminished light touch sensation.    No evidence of dysmetria noted.  Gait is antalgic.  Straight leg raise is positive at 30 degrees on the right.     Medical Decision Making  Imaging: MRI lumbar spine 02/17/2023 is pending formal review, but shows a significant disc herniation at L5-S1 with severe lateral recess compression on the right side at L5-S1.  Pertinent imaging shown below.     I have personally reviewed the images and agree with the above interpretation.  Assessment and Plan: [name] is a pleasant 22 y.o. 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  She has significant objective weakness.  She has had symptoms for more than 3 years.  At this point, no further conservative management is indicated due to the severity of her weakness.  I recommended surgical intervention with right sided L5-S1 microdiscectomy.  I discussed the planned procedure at length with the patient, including the risks, benefits, alternatives, and indications. The risks discussed include but are not limited to bleeding, infection, need for reoperation, spinal fluid leak, stroke, vision loss, anesthetic complication, coma, paralysis, and even death. I also described in detail that improvement was not guaranteed.  The patient expressed understanding of these risks, and asked that we proceed with surgery. I described the surgery in layman's terms, and gave ample opportunity for questions, which were answered to the best of my ability.  I spent a total of 30 minutes in  this patient's care today. This time was spent reviewing pertinent records including imaging studies, obtaining and confirming history, performing a directed evaluation, formulating and discussing my recommendations, and documenting the visit within the medical record.   Thank you for involving me in the care of this patient.      Bryne Lindon K. Myer Haff MD, [facility]

## 2023-03-01 NOTE — Progress Notes (Unsigned)
Referring Physician:  Lorre Munroe, NP [address] [place],  [place] [address]  Primary Physician:  Lorre Munroe, NP  History of Present Illness: 03/01/2023 [name] is here today with a chief complaint of chronic lower back and hip pain that has been ongoing for approximately three years. The pain is primarily located in the lower back and buttock region, radiating down to the hip. The patient has severe pain in the buttock and extending down the leg, and numbness below the knee on the posterior lateral aspect and on the lateral aspect of the foot extending towards the toes.  The patient has noticed significant weakness in the affected leg. The pain is exacerbated by walking and standing in one position, particularly when arching the back. The patient reports being able to walk a short distance, approximately the length of a parking lot, before needing to stop due to leg pain.  The patient has attempted physical therapy for a month, but reports that it exacerbated the pain. The patient has been managing the pain at home, but it has been progressively worsening, particularly after childbirth 6 months ago.   Bowel/Bladder Dysfunction: none  Conservative measures: TENS unit, seen a chiropractor Physical therapy: has participated in at [facility] without relief Multimodal medical therapy including regular antiinflammatories: ibuprofen Injections: has not received epidural steroid injections, she is allergic to injections  Past Surgery: no previous spinal surgeries   Tamara Mcclain has no symptoms of cervical myelopathy.  The symptoms are causing a significant impact on the patient's life.   I have utilized the care everywhere function in epic to review the outside records available from external health systems.  Review of Systems:  A 10 point review of systems is negative, except for the pertinent positives and negatives detailed in the HPI.  Past Medical  History: Past Medical History:  Diagnosis Date   Anxiety    Epiploic appendagitis    Kidney stones 04/13/2022   Right Flank   MTHFR mutation    Von Willebrand disease (HCC) Dr. Janee Morn at [facility]    Past Surgical History: Past Surgical History:  Procedure Laterality Date   ESOPHAGOGASTRODUODENOSCOPY (EGD) WITH PROPOFOL N/A 01/30/2020   Procedure: ESOPHAGOGASTRODUODENOSCOPY (EGD) WITH PROPOFOL;  Surgeon: Wyline Mood, MD;  Location: [facility] ENDOSCOPY;  Service: Gastroenterology;  Laterality: N/A;   WISDOM TOOTH EXTRACTION  08/2018   no bleeding complications    Allergies: Allergies as of 03/02/2023 - Review Complete 02/26/2023  Allergen Reaction Noted   Tylenol [acetaminophen] Other (See Comments) 08/18/2016   Depo-medrol [methylprednisolone] Nausea Only 10/24/2019    Medications:  Current Outpatient Medications:    albuterol (VENTOLIN HFA) 108 (90 Base) MCG/ACT inhaler, Inhale 1-2 puffs into the lungs every 6 (six) hours as needed for wheezing or shortness of breath., Disp: 8 g, Rfl: 1   amoxicillin-clavulanate (AUGMENTIN) 875-125 MG tablet, Take 1 tablet by mouth 2 (two) times daily., Disp: 20 tablet, Rfl: 0   ibuprofen (ADVIL) 600 MG tablet, Take 1 tablet (600 mg total) by mouth every 6 (six) hours as needed., Disp: 30 tablet, Rfl: 0   levonorgestrel (MIRENA) 20 MCG/DAY IUD, 1 each by Intrauterine route once., Disp: , Rfl:    metoprolol succinate (TOPROL-XL) 50 MG 24 hr tablet, Take 1 tablet (50 mg total) by mouth daily. Take with or immediately following a meal., Disp: 90 tablet, Rfl: 0  Social History: Social History   Tobacco Use   Smoking status: Never    Passive  exposure: Past   Smokeless tobacco: Never  Vaping Use   Vaping status: Never Used  Substance Use Topics   Alcohol use: No    Alcohol/week: 0.0 standard drinks of alcohol   Drug use: No    Family Medical History: Family History  Problem Relation Age of Onset   Hypertension Father    Pulmonary embolism  Father    Cancer Maternal Grandmother 34       breast   Arthritis Maternal Grandmother    Hyperlipidemia Maternal Grandmother    CAD Maternal Grandfather    Cancer Paternal Grandmother        melanoma, sinus cancer   Colon polyps Paternal Grandmother    Heart disease Paternal Grandfather    Gaucher's disease Half-Brother    Esophageal cancer Neg Hx    Stomach cancer Neg Hx    Liver disease Neg Hx     Physical Examination: There were no vitals filed for this visit.  General: Patient is in no apparent distress. Attention to examination is appropriate.  Neck:   Supple.  Full range of motion.  Respiratory: Patient is breathing without any difficulty.   NEUROLOGICAL:     Awake, alert, oriented to person, place, and time.  Speech is clear and fluent.   Cranial Nerves: Pupils equal round and reactive to light.  Facial tone is symmetric.  Facial sensation is symmetric. Shoulder shrug is symmetric. Tongue protrusion is midline.  There is no pronator drift.  Strength: Side Biceps Triceps Deltoid Interossei Grip Wrist Ext. Wrist Flex.  R 5 5 5 5 5 5 5   L 5 5 5 5 5 5 5    Side Iliopsoas Quads Hamstring PF DF EHL  R 5 5 5 3 5 5   L 5 5 5 5 5 5    Reflexes are 1+ and symmetric at the biceps, triceps, brachioradialis, patella and achilles.   Hoffman's is absent.   Bilateral upper and lower extremity sensation is intact to light touch with exception of right S1 distribution which shows diminished light touch sensation.    No evidence of dysmetria noted.  Gait is antalgic.  Straight leg raise is positive at 30 degrees on the right.     Medical Decision Making  Imaging: MRI lumbar spine 02/17/2023 is pending formal review, but shows a significant disc herniation at L5-S1 with severe lateral recess compression on the right side at L5-S1.  Pertinent imaging shown below.     I have personally reviewed the images and agree with the above interpretation.  Assessment and Plan: [name] is a pleasant 22 y.o. 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  She has significant objective weakness.  She has had symptoms for more than 3 years.  At this point, no further conservative management is indicated due to the severity of her weakness.  I recommended surgical intervention with right sided L5-S1 microdiscectomy.  I discussed the planned procedure at length with the patient, including the risks, benefits, alternatives, and indications. The risks discussed include but are not limited to bleeding, infection, need for reoperation, spinal fluid leak, stroke, vision loss, anesthetic complication, coma, paralysis, and even death. I also described in detail that improvement was not guaranteed.  The patient expressed understanding of these risks, and asked that we proceed with surgery. I described the surgery in layman's terms, and gave ample opportunity for questions, which were answered to the best of my ability.  I spent a total of 30 minutes in  this patient's care today. This time was spent reviewing pertinent records including imaging studies, obtaining and confirming history, performing a directed evaluation, formulating and discussing my recommendations, and documenting the visit within the medical record.   Thank you for involving me in the care of this patient.      Bryne Lindon K. Myer Haff MD, [facility]

## 2023-03-02 ENCOUNTER — Other Ambulatory Visit: Payer: Self-pay

## 2023-03-02 ENCOUNTER — Telehealth: Payer: Self-pay

## 2023-03-02 ENCOUNTER — Ambulatory Visit (INDEPENDENT_AMBULATORY_CARE_PROVIDER_SITE_OTHER): Payer: BC Managed Care – PPO | Admitting: Neurosurgery

## 2023-03-02 ENCOUNTER — Encounter: Payer: Self-pay | Admitting: Neurosurgery

## 2023-03-02 VITALS — BP 128/80 | Ht 60.0 in | Wt 179.0 lb

## 2023-03-02 DIAGNOSIS — Z01818 Encounter for other preprocedural examination: Secondary | ICD-10-CM

## 2023-03-02 DIAGNOSIS — M5117 Intervertebral disc disorders with radiculopathy, lumbosacral region: Secondary | ICD-10-CM

## 2023-03-02 DIAGNOSIS — M5416 Radiculopathy, lumbar region: Secondary | ICD-10-CM

## 2023-03-02 NOTE — Telephone Encounter (Signed)
She called back to choose her surgery date (03/29/23)

## 2023-03-02 NOTE — Addendum Note (Signed)
Addended by: Sharlot Gowda on: 03/02/2023 02:44 PM   Modules accepted: Orders

## 2023-03-02 NOTE — Patient Instructions (Signed)
Please see below for information in regards to your upcoming surgery:   Planned surgery: Right L5-S1 microdiscectomy   Surgery date: (Dr Myer Haff operates on Mondays and Wednesdays - contact us when you are ready to choose a date) at [facility]: [address], [place], [place] [address]) - you will find out your arrival time the business day before your surgery.   Pre-op appointment at [facility] Pre-admit Testing: we will call you with a date/time for this. If you are scheduled for an in person appointment, Pre-admit Testing is located on the first floor of the [facility], [address], [address]. Please bring all prescriptions in the original prescription bottles to your appointment. During this appointment, they will advise you which medications you can take the morning of surgery, and which medications you will need to hold for surgery. Labs (such as blood work, EKG) may be done at your pre-op appointment. You are not required to fast for these labs. Should you need to change your pre-op appointment, please call Pre-admit testing at [phone].      Common restrictions after surgery: No bending, lifting, or twisting ("BLT"). Avoid lifting objects heavier than 10 pounds for the first 6 weeks after surgery. Where possible, avoid household activities that involve lifting, bending, reaching, pushing, or pulling such as laundry, vacuuming, grocery shopping, and childcare. Try to arrange for help from friends and family for these activities while you heal. Do not drive while taking prescription pain medication. Weeks 6 through 12 after surgery: avoid lifting more than 25 pounds.     How to contact us:  If you have any questions/concerns before or after surgery, you can reach [place] at [phone], or you can send a mychart message. We can be reached by phone or mychart 8am-4pm, Monday-Friday.  *Please note: Calls after 4pm are  forwarded to a third party answering service. Mychart messages are not routinely monitored during evenings, weekends, and holidays. Please call our office to contact the answering service for urgent concerns during non-business hours.     If you have FMLA/disability paperwork, please drop it off or fax it to [phone], attention Patty.   Appointments/FMLA & disability paperwork: Joycelyn Rua, & Flonnie Hailstone Registered Nurse/Surgery scheduler: Royston Cowper Medical Assistants: Nash Mantis Physician Assistants: Manning Charity, PA-C & Drake Leach, PA-C Surgeons: Venetia Night, MD & Ernestine Mcmurray, MD

## 2023-03-03 NOTE — Telephone Encounter (Signed)
She called back wants her surgery moved to the 11/13

## 2023-03-03 NOTE — Telephone Encounter (Signed)
I moved her surgery to 03/24/23 and I moved all 3 of her post-op appointments accordingly. Can you notify her of this when you notify her of her new PAT appointment once her posting sheet comes back? Thanks!

## 2023-03-04 NOTE — Telephone Encounter (Signed)
Patient has called stating she is in a lot of pain and would like to move her surgery up sooner if she can, please advise

## 2023-03-04 NOTE — Telephone Encounter (Signed)
I spoke with Tamara Mcclain. She has requested to move her surgery up to 03/17/23. I have notified the OR and moved her post op appointments accordingly.

## 2023-03-09 ENCOUNTER — Ambulatory Visit: Payer: BC Managed Care – PPO | Admitting: Neurosurgery

## 2023-03-10 ENCOUNTER — Other Ambulatory Visit: Payer: Self-pay

## 2023-03-10 ENCOUNTER — Encounter
Admission: RE | Admit: 2023-03-10 | Discharge: 2023-03-10 | Disposition: A | Discharge status: Home / Self Care | Payer: BC Managed Care – PPO | Source: Ambulatory Visit | Attending: Neurosurgery | Admitting: Neurosurgery

## 2023-03-10 DIAGNOSIS — I10 Essential (primary) hypertension: Secondary | ICD-10-CM | POA: Insufficient documentation

## 2023-03-10 DIAGNOSIS — Z01810 Encounter for preprocedural cardiovascular examination: Secondary | ICD-10-CM | POA: Diagnosis not present

## 2023-03-10 DIAGNOSIS — Z01812 Encounter for preprocedural laboratory examination: Secondary | ICD-10-CM

## 2023-03-10 DIAGNOSIS — Z01818 Encounter for other preprocedural examination: Secondary | ICD-10-CM | POA: Insufficient documentation

## 2023-03-10 HISTORY — DX: Unspecified asthma, uncomplicated: J45.909

## 2023-03-10 HISTORY — DX: Essential (primary) hypertension: I10

## 2023-03-10 HISTORY — DX: Other intervertebral disc displacement, lumbosacral region: M51.27

## 2023-03-10 LAB — URINALYSIS, COMPLETE (UACMP) WITH MICROSCOPIC
Bilirubin Urine: NEGATIVE
Glucose, UA: NEGATIVE mg/dL
Hgb urine dipstick: NEGATIVE
Ketones, ur: NEGATIVE mg/dL
Nitrite: NEGATIVE
Protein, ur: NEGATIVE mg/dL
Specific Gravity, Urine: 1.019 (ref 1.005–1.030)
pH: 7 (ref 5.0–8.0)

## 2023-03-10 LAB — TYPE AND SCREEN
ABO/RH(D): A POS
Antibody Screen: NEGATIVE

## 2023-03-10 LAB — SURGICAL PCR SCREEN
MRSA, PCR: NEGATIVE
Staphylococcus aureus: NEGATIVE

## 2023-03-10 NOTE — Patient Instructions (Addendum)
Your procedure is scheduled on: Wednesday, November 6 Report to the Registration Desk on the 1st floor of the [facility] To find out your arrival time, please call [phone] between 1PM - 3PM on: Tuesday, November 5 If your arrival time is 6:00 am, do not arrive before that time as the [facility] entrance doors do not open until 6:00 am.  REMEMBER: Instructions that are not followed completely may result in serious medical risk, up to and including death; or upon the discretion of your surgeon and anesthesiologist your surgery may need to be rescheduled.  Do not eat food after midnight the night before surgery.  No gum chewing or hard candies.  You may however, drink CLEAR liquids up to 2 hours before you are scheduled to arrive for your surgery. Do not drink anything within 2 hours of your scheduled arrival time.  Clear liquids include: - water  - apple juice without pulp - gatorade (not RED colors) - black coffee or tea (Do NOT add milk or creamers to the coffee or tea) Do NOT drink anything that is not on this list.  Continue taking all of your other prescription medications up until the day of surgery.  ON THE DAY OF SURGERY ONLY TAKE THESE MEDICATIONS WITH SIPS OF WATER:  Albuterol inhalers on the day of surgery and bring to the hospital.  No Alcohol for 24 hours before or after surgery.  No Smoking including e-cigarettes for 24 hours before surgery.  No chewable tobacco products for at least 6 hours before surgery.  No nicotine patches on the day of surgery.  Do not use any "recreational" drugs for at least a week (preferably 2 weeks) before your surgery.  Please be advised that the combination of cocaine and anesthesia may have negative outcomes, up to and including death. If you test positive for cocaine, your surgery will be cancelled.  On the morning of surgery brush your teeth with toothpaste and water, you may rinse your mouth with mouthwash if you wish. Do  not swallow any toothpaste or mouthwash.  Use CHG Soap as directed on instruction sheet.  Do not wear jewelry, make-up, hairpins, clips or nail polish.  For welded (permanent) jewelry: bracelets, anklets, waist bands, etc.  Please have this removed prior to surgery.  If it is not removed, there is a chance that hospital personnel will need to cut it off on the day of surgery.  Do not wear lotions, powders, or perfumes.   Do not shave body hair from the neck down 48 hours before surgery.  Contact lenses, hearing aids and dentures may not be worn into surgery.  Do not bring valuables to the hospital. [facility] is not responsible for any missing/lost belongings or valuables.   Notify your doctor if there is any change in your medical condition (cold, fever, infection).  Wear comfortable clothing (specific to your surgery type) to the hospital.  After surgery, you can help prevent lung complications by doing breathing exercises.  Take deep breaths and cough every 1-2 hours. Your doctor may order a device called an Incentive Spirometer to help you take deep breaths.  If you are being discharged the day of surgery, you will not be allowed to drive home. You will need a responsible individual to drive you home and stay with you for 24 hours after surgery.   If you are taking public transportation, you will need to have a responsible individual with you.  Please call the Pre-admissions Testing  Dept. at [phone] if you have any questions about these instructions.  Surgery Visitation Policy:  Patients having surgery or a procedure may have two visitors.  Children under the age of 56 must have an adult with them who is not the patient.        Pre-operative 5 CHG Bath Instructions   You can play a key role in reducing the risk of infection after surgery. Your skin needs to be as free of germs as possible. You can reduce the number of germs on your skin by washing with CHG  (chlorhexidine gluconate) soap before surgery. CHG is an antiseptic soap that kills germs and continues to kill germs even after washing.   DO NOT use if you have an allergy to chlorhexidine/CHG or antibacterial soaps. If your skin becomes reddened or irritated, stop using the CHG and notify one of our RNs at [phone].   Please shower with the CHG soap starting 4 days before surgery using the following schedule:     Please keep in mind the following:  DO NOT shave, including legs and underarms, starting the day of your first shower.   You may shave your face at any point before/day of surgery.  Place clean sheets on your bed the day you start using CHG soap. Use a clean washcloth (not used since being washed) for each shower. DO NOT sleep with pets once you start using the CHG.   CHG Shower Instructions:  If you choose to wash your hair and private area, wash first with your normal shampoo/soap.  After you use shampoo/soap, rinse your hair and body thoroughly to remove shampoo/soap residue.  Turn the water OFF and apply about 3 tablespoons (45 ml) of CHG soap to a CLEAN washcloth.  Apply CHG soap ONLY FROM YOUR NECK DOWN TO YOUR TOES (washing for 3-5 minutes)  DO NOT use CHG soap on face, private areas, open wounds, or sores.  Pay special attention to the area where your surgery is being performed.  If you are having back surgery, having someone wash your back for you may be helpful. Wait 2 minutes after CHG soap is applied, then you may rinse off the CHG soap.  Pat dry with a clean towel  Put on clean clothes/pajamas   If you choose to wear lotion, please use ONLY the CHG-compatible lotions on the back of this paper.     Additional instructions for the day of surgery: DO NOT APPLY any lotions, deodorants, cologne, or perfumes.   Put on clean/comfortable clothes.  Brush your teeth.  Ask your nurse before applying any prescription medications to the skin.      CHG Compatible  Lotions   Aveeno Moisturizing lotion  Cetaphil Moisturizing Cream  Cetaphil Moisturizing Lotion  Clairol Herbal Essence Moisturizing Lotion, Dry Skin  Clairol Herbal Essence Moisturizing Lotion, Extra Dry Skin  Clairol Herbal Essence Moisturizing Lotion, Normal Skin  Curel Age Defying Therapeutic Moisturizing Lotion with Alpha Hydroxy  Curel Extreme Care Body Lotion  Curel Soothing Hands Moisturizing Hand Lotion  Curel Therapeutic Moisturizing Cream, Fragrance-Free  Curel Therapeutic Moisturizing Lotion, Fragrance-Free  Curel Therapeutic Moisturizing Lotion, Original Formula  Eucerin Daily Replenishing Lotion  Eucerin Dry Skin Therapy Plus Alpha Hydroxy Crme  Eucerin Dry Skin Therapy Plus Alpha Hydroxy Lotion  Eucerin Original Crme  Eucerin Original Lotion  Eucerin Plus Crme Eucerin Plus Lotion  Eucerin TriLipid Replenishing Lotion  Keri Anti-Bacterial Hand Lotion  Keri Deep Conditioning Original Lotion Dry Skin Formula Softly Scented  Keri Deep Conditioning Original Lotion, Fragrance Free Sensitive Skin Formula  Keri Lotion Fast Absorbing Fragrance Free Sensitive Skin Formula  Keri Lotion Fast Absorbing Softly Scented Dry Skin Formula  Keri Original Lotion  Keri Skin Renewal Lotion Keri Silky Smooth Lotion  Keri Silky Smooth Sensitive Skin Lotion  Nivea Body Creamy Conditioning Oil  Nivea Body Extra Enriched Teacher, adult education Moisturizing Lotion Nivea Crme  Nivea Skin Firming Lotion  NutraDerm 30 Skin Lotion  NutraDerm Skin Lotion  NutraDerm Therapeutic Skin Cream  NutraDerm Therapeutic Skin Lotion  ProShield Protective Hand Cream  Provon moisturizing lotion

## 2023-03-12 LAB — URINE CULTURE: Culture: <10000 — AB

## 2023-03-16 ENCOUNTER — Other Ambulatory Visit: Payer: Self-pay | Admitting: Internal Medicine

## 2023-03-16 MED ORDER — ALBUTEROL SULFATE HFA 108 (90 BASE) MCG/ACT IN AERS: 1.0000 | INHALATION_SPRAY | Freq: Four times a day (QID) | RESPIRATORY_TRACT | 2 refills | Status: DC | PRN

## 2023-03-16 MED ORDER — LACTATED RINGERS IV SOLN: INTRAVENOUS | Status: DC

## 2023-03-16 MED ORDER — CEFAZOLIN IN SODIUM CHLORIDE 2-0.9 GM/100ML-% IV SOLN
2.0000 g | Freq: Once | INTRAVENOUS | Status: DC
  Filled 2023-03-16: qty 100

## 2023-03-16 MED ORDER — CEFAZOLIN SODIUM-DEXTROSE 2-4 GM/100ML-% IV SOLN
2.0000 g | INTRAVENOUS | Status: AC
  Administered 2023-03-17: 2 g via INTRAVENOUS

## 2023-03-16 MED ORDER — CHLORHEXIDINE GLUCONATE 0.12 % MT SOLN
15.0000 mL | Freq: Once | OROMUCOSAL | Status: AC
  Administered 2023-03-17: 15 mL via OROMUCOSAL

## 2023-03-16 MED ORDER — ORAL CARE MOUTH RINSE: 15.0000 mL | Freq: Once | OROMUCOSAL | Status: AC

## 2023-03-16 NOTE — Telephone Encounter (Signed)
Pt has back surgery 03/17/2023, and must bring her inhaler with her to the hospital she has been told.   Medication Refill - Medication: albuterol (VENTOLIN HFA) 108 (90 Base) MCG/ACT inhaler   Has the patient contacted their pharmacy? Yes.   (Agent: If no, request that the patient contact the pharmacy for the refill. If patient does not wish to contact the pharmacy document the reason why and proceed with request.) (Agent: If yes, when and what did the pharmacy advise?)  Preferred Pharmacy (with phone number or street name):  [facility] [address] [place] - [address]  [address] [place] [address]  Phone: [phone] Fax: [phone]   Has the patient been seen for an appointment in the last year OR does the patient have an upcoming appointment? Yes.    Agent: Please be advised that RX refills may take up to 3 business days. We ask that you follow-up with your pharmacy.

## 2023-03-16 NOTE — Telephone Encounter (Signed)
Requested Prescriptions  Pending Prescriptions Disp Refills   albuterol (VENTOLIN HFA) 108 (90 Base) MCG/ACT inhaler 8 g 2    Sig: Inhale 1-2 puffs into the lungs every 6 (six) hours as needed for wheezing or shortness of breath.     Pulmonology:  Beta Agonists 2 Failed - 03/16/2023 11:57 AM      Failed - Last BP in normal range    BP Readings from Last 1 Encounters:  03/10/23 (!) 124/97         Passed - Last Heart Rate in normal range    Pulse Readings from Last 1 Encounters:  03/10/23 90         Passed - Valid encounter within last 12 months    Recent Outpatient Visits           2 weeks ago Non-recurrent acute serous otitis media of left ear   [place] [facility] [place], Salvadore Oxford, NP   2 weeks ago Primary hypertension   [place] [facility] [place], Salvadore Oxford, NP   1 month ago Primary hypertension   [place] [facility] [place], Salvadore Oxford, NP   1 month ago Primary hypertension   [place] [facility] [place], Salvadore Oxford, NP   4 months ago Primary hypertension   [place] [facility] [place], Salvadore Oxford, NP       Future Appointments             In 1 month Baity, Salvadore Oxford, NP [place] [facility]

## 2023-03-17 ENCOUNTER — Ambulatory Visit
Admission: RE | Admit: 2023-03-17 | Discharge: 2023-03-17 | Disposition: A | Discharge status: Home / Self Care | Payer: BC Managed Care – PPO | Source: Ambulatory Visit | Attending: Neurosurgery | Admitting: Neurosurgery

## 2023-03-17 ENCOUNTER — Encounter
Admission: RE | Disposition: A | Discharge status: Home / Self Care | Payer: Self-pay | Source: Ambulatory Visit | Attending: Neurosurgery

## 2023-03-17 ENCOUNTER — Other Ambulatory Visit: Payer: Self-pay

## 2023-03-17 ENCOUNTER — Ambulatory Visit: Payer: BC Managed Care – PPO

## 2023-03-17 ENCOUNTER — Ambulatory Visit: Payer: BC Managed Care – PPO | Admitting: Urgent Care

## 2023-03-17 ENCOUNTER — Other Ambulatory Visit: Payer: Self-pay | Admitting: Neurosurgery

## 2023-03-17 ENCOUNTER — Encounter: Payer: Self-pay | Admitting: Neurosurgery

## 2023-03-17 DIAGNOSIS — I10 Essential (primary) hypertension: Secondary | ICD-10-CM

## 2023-03-17 DIAGNOSIS — M5416 Radiculopathy, lumbar region: Secondary | ICD-10-CM | POA: Diagnosis not present

## 2023-03-17 DIAGNOSIS — R8290 Unspecified abnormal findings in urine: Secondary | ICD-10-CM

## 2023-03-17 DIAGNOSIS — M5117 Intervertebral disc disorders with radiculopathy, lumbosacral region: Secondary | ICD-10-CM | POA: Insufficient documentation

## 2023-03-17 DIAGNOSIS — G8929 Other chronic pain: Secondary | ICD-10-CM | POA: Diagnosis not present

## 2023-03-17 DIAGNOSIS — Z01812 Encounter for preprocedural laboratory examination: Secondary | ICD-10-CM

## 2023-03-17 DIAGNOSIS — Z01818 Encounter for other preprocedural examination: Secondary | ICD-10-CM

## 2023-03-17 HISTORY — PX: LUMBAR LAMINECTOMY/DECOMPRESSION MICRODISCECTOMY: SHX5026

## 2023-03-17 LAB — POCT PREGNANCY, URINE: Preg Test, Ur: NEGATIVE

## 2023-03-17 SURGERY — LUMBAR LAMINECTOMY/DECOMPRESSION MICRODISCECTOMY 1 LEVEL
Anesthesia: General | Site: Spine Lumbar | Laterality: Right

## 2023-03-17 MED ORDER — ACETAMINOPHEN 10 MG/ML IV SOLN
INTRAVENOUS | Status: AC
  Filled 2023-03-17: qty 100

## 2023-03-17 MED ORDER — OXYCODONE HCL 5 MG PO TABS
ORAL_TABLET | ORAL | Status: AC
  Filled 2023-03-17: qty 1

## 2023-03-17 MED ORDER — MIDAZOLAM HCL 2 MG/2ML IJ SOLN
INTRAMUSCULAR | Status: AC
  Filled 2023-03-17: qty 2

## 2023-03-17 MED ORDER — HYDROMORPHONE HCL 1 MG/ML IJ SOLN
0.2500 mg | INTRAMUSCULAR | Status: DC | PRN
  Administered 2023-03-17: 0.5 mg via INTRAVENOUS

## 2023-03-17 MED ORDER — OXYCODONE HCL 5 MG PO TABS: 5.0000 mg | ORAL_TABLET | ORAL | 0 refills | Status: AC | PRN

## 2023-03-17 MED ORDER — ROCURONIUM BROMIDE 100 MG/10ML IV SOLN
INTRAVENOUS | Status: DC | PRN
  Administered 2023-03-17 (×2): 50 mg via INTRAVENOUS

## 2023-03-17 MED ORDER — BUPIVACAINE-EPINEPHRINE (PF) 0.5% -1:200000 IJ SOLN
INTRAMUSCULAR | Status: AC
  Filled 2023-03-17: qty 10

## 2023-03-17 MED ORDER — METHOCARBAMOL 500 MG PO TABS: 500.0000 mg | ORAL_TABLET | Freq: Four times a day (QID) | ORAL | 0 refills | Status: DC | PRN

## 2023-03-17 MED ORDER — LIDOCAINE HCL (CARDIAC) PF 100 MG/5ML IV SOSY
PREFILLED_SYRINGE | INTRAVENOUS | Status: DC | PRN
  Administered 2023-03-17: 100 mg via INTRAVENOUS

## 2023-03-17 MED ORDER — PROPOFOL 1000 MG/100ML IV EMUL
INTRAVENOUS | Status: AC
  Filled 2023-03-17: qty 100

## 2023-03-17 MED ORDER — CHLORHEXIDINE GLUCONATE 0.12 % MT SOLN
OROMUCOSAL | Status: AC
  Filled 2023-03-17: qty 15

## 2023-03-17 MED ORDER — DEXMEDETOMIDINE HCL IN NACL 80 MCG/20ML IV SOLN
INTRAVENOUS | Status: DC | PRN
  Administered 2023-03-17: 20 ug via INTRAVENOUS

## 2023-03-17 MED ORDER — CEFAZOLIN SODIUM-DEXTROSE 2-4 GM/100ML-% IV SOLN
INTRAVENOUS | Status: AC
  Filled 2023-03-17: qty 100

## 2023-03-17 MED ORDER — ALBUTEROL SULFATE HFA 108 (90 BASE) MCG/ACT IN AERS
INHALATION_SPRAY | RESPIRATORY_TRACT | Status: DC | PRN
  Administered 2023-03-17: 10 via RESPIRATORY_TRACT

## 2023-03-17 MED ORDER — SEVOFLURANE IN SOLN
RESPIRATORY_TRACT | Status: AC
  Filled 2023-03-17: qty 250

## 2023-03-17 MED ORDER — OXYCODONE HCL 5 MG PO TABS
5.0000 mg | ORAL_TABLET | Freq: Once | ORAL | Status: AC | PRN
  Administered 2023-03-17: 5 mg via ORAL

## 2023-03-17 MED ORDER — SURGIFLO WITH THROMBIN (HEMOSTATIC MATRIX KIT) OPTIME
TOPICAL | Status: DC | PRN
  Administered 2023-03-17: 1 via TOPICAL

## 2023-03-17 MED ORDER — FENTANYL CITRATE (PF) 100 MCG/2ML IJ SOLN
INTRAMUSCULAR | Status: AC
  Filled 2023-03-17: qty 2

## 2023-03-17 MED ORDER — BUPIVACAINE HCL (PF) 0.5 % IJ SOLN
INTRAMUSCULAR | Status: AC
  Filled 2023-03-17: qty 30

## 2023-03-17 MED ORDER — SENNA 8.6 MG PO TABS
1.0000 | ORAL_TABLET | Freq: Two times a day (BID) | ORAL | 0 refills | Status: DC | PRN
Start: 2023-03-17 — End: 2023-03-30

## 2023-03-17 MED ORDER — 0.9 % SODIUM CHLORIDE (POUR BTL) OPTIME
TOPICAL | Status: DC | PRN
  Administered 2023-03-17: 500 mL

## 2023-03-17 MED ORDER — BUPIVACAINE-EPINEPHRINE (PF) 0.5% -1:200000 IJ SOLN
INTRAMUSCULAR | Status: DC | PRN
  Administered 2023-03-17: 6 mL

## 2023-03-17 MED ORDER — OXYCODONE HCL 5 MG/5ML PO SOLN
5.0000 mg | Freq: Once | ORAL | Status: AC | PRN
Start: 2023-03-17 — End: 2023-03-17

## 2023-03-17 MED ORDER — MIDAZOLAM HCL 2 MG/2ML IJ SOLN
INTRAMUSCULAR | Status: DC | PRN
  Administered 2023-03-17 (×2): 2 mg via INTRAVENOUS

## 2023-03-17 MED ORDER — PROPOFOL 10 MG/ML IV BOLUS
INTRAVENOUS | Status: AC
  Filled 2023-03-17: qty 20

## 2023-03-17 MED ORDER — OXYCODONE HCL 5 MG PO TABS: 5.0000 mg | ORAL_TABLET | ORAL | 0 refills | Status: DC | PRN

## 2023-03-17 MED ORDER — METHYLPREDNISOLONE ACETATE 40 MG/ML IJ SUSP
INTRAMUSCULAR | Status: AC
  Filled 2023-03-17: qty 1

## 2023-03-17 MED ORDER — ONDANSETRON HCL 4 MG/2ML IJ SOLN
INTRAMUSCULAR | Status: DC | PRN
  Administered 2023-03-17: 4 mg via INTRAVENOUS

## 2023-03-17 MED ORDER — DEXAMETHASONE SODIUM PHOSPHATE 10 MG/ML IJ SOLN
INTRAMUSCULAR | Status: DC | PRN
  Administered 2023-03-17: 10 mg via INTRAVENOUS

## 2023-03-17 MED ORDER — SODIUM CHLORIDE FLUSH 0.9 % IV SOLN
INTRAVENOUS | Status: AC
  Filled 2023-03-17: qty 20

## 2023-03-17 MED ORDER — SODIUM CHLORIDE (PF) 0.9 % IJ SOLN
INTRAMUSCULAR | Status: DC | PRN
  Administered 2023-03-17: 60 mL via INTRAMUSCULAR

## 2023-03-17 MED ORDER — FENTANYL CITRATE (PF) 100 MCG/2ML IJ SOLN
INTRAMUSCULAR | Status: DC | PRN
  Administered 2023-03-17 (×2): 50 ug via INTRAVENOUS

## 2023-03-17 MED ORDER — PROPOFOL 10 MG/ML IV BOLUS
INTRAVENOUS | Status: DC | PRN
  Administered 2023-03-17: 240 mg via INTRAVENOUS
  Administered 2023-03-17: 50 ug/kg/min via INTRAVENOUS

## 2023-03-17 MED ORDER — BUPIVACAINE LIPOSOME 1.3 % IJ SUSP
INTRAMUSCULAR | Status: AC
  Filled 2023-03-17: qty 20

## 2023-03-17 MED ORDER — SUGAMMADEX SODIUM 200 MG/2ML IV SOLN
INTRAVENOUS | Status: DC | PRN
  Administered 2023-03-17: 200 mg via INTRAVENOUS

## 2023-03-17 MED ORDER — HYDROMORPHONE HCL 1 MG/ML IJ SOLN
INTRAMUSCULAR | Status: AC
  Filled 2023-03-17: qty 1

## 2023-03-17 SURGICAL SUPPLY — 40 items
ADH SKN CLS APL DERMABOND .7 (GAUZE/BANDAGES/DRESSINGS) ×1
AGENT HMST KT MTR STRL THRMB (HEMOSTASIS) ×1
BASIN KIT SINGLE STR (MISCELLANEOUS) ×1 IMPLANT
BUR NEURO DRILL SOFT 3.0X3.8M (BURR) ×1 IMPLANT
CNTNR URN SCR LID CUP LEK RST (MISCELLANEOUS) ×1 IMPLANT
CONT SPEC 4OZ STRL OR WHT (MISCELLANEOUS) ×1
DERMABOND ADVANCED .7 DNX12 (GAUZE/BANDAGES/DRESSINGS) ×1 IMPLANT
DRAPE C ARM PK CFD 31 SPINE (DRAPES) ×1 IMPLANT
DRAPE LAPAROTOMY 100X77 ABD (DRAPES) ×1 IMPLANT
DRAPE MICROSCOPE SPINE 48X150 (DRAPES) IMPLANT
DRSG OPSITE POSTOP 3X4 (GAUZE/BANDAGES/DRESSINGS) IMPLANT
ELECT EZSTD 165MM 6.5IN (MISCELLANEOUS) ×1
ELECT REM PT RETURN 9FT ADLT (ELECTROSURGICAL) ×1
ELECTRODE EZSTD 165MM 6.5IN (MISCELLANEOUS) ×1 IMPLANT
ELECTRODE REM PT RTRN 9FT ADLT (ELECTROSURGICAL) ×1 IMPLANT
GLOVE BIOGEL PI IND STRL 6.5 (GLOVE) ×1 IMPLANT
GLOVE SURG SYN 6.5 ES PF (GLOVE) ×1 IMPLANT
GLOVE SURG SYN 6.5 PF PI (GLOVE) ×1 IMPLANT
GLOVE SURG SYN 8.5 E (GLOVE) ×3 IMPLANT
GLOVE SURG SYN 8.5 PF PI (GLOVE) ×3 IMPLANT
GOWN SRG LRG LVL 4 IMPRV REINF (GOWNS) ×1 IMPLANT
GOWN SRG XL LVL 3 NONREINFORCE (GOWNS) ×1 IMPLANT
GOWN STRL NON-REIN TWL XL LVL3 (GOWNS) ×1
GOWN STRL REIN LRG LVL4 (GOWNS) ×1
KIT SPINAL PRONEVIEW (KITS) ×1 IMPLANT
MANIFOLD NEPTUNE II (INSTRUMENTS) ×1 IMPLANT
MARKER SKIN DUAL TIP RULER LAB (MISCELLANEOUS) ×1 IMPLANT
NDL SAFETY ECLIPSE 18X1.5 (NEEDLE) ×1 IMPLANT
NS IRRIG 1000ML POUR BTL (IV SOLUTION) ×1 IMPLANT
NS IRRIG 500ML POUR BTL (IV SOLUTION) IMPLANT
PACK LAMINECTOMY ARMC (PACKS) ×1 IMPLANT
SURGIFLO W/THROMBIN 8M KIT (HEMOSTASIS) ×1 IMPLANT
SUT STRATA 3-0 15 PS-2 (SUTURE) ×1 IMPLANT
SUT VIC AB 0 CT1 27 (SUTURE) ×1
SUT VIC AB 0 CT1 27XCR 8 STRN (SUTURE) ×1 IMPLANT
SUT VIC AB 2-0 CT1 18 (SUTURE) ×1 IMPLANT
SYR 30ML LL (SYRINGE) ×2 IMPLANT
SYR 3ML LL SCALE MARK (SYRINGE) ×1 IMPLANT
TRAP FLUID SMOKE EVACUATOR (MISCELLANEOUS) ×1 IMPLANT
WATER STERILE IRR 1000ML POUR (IV SOLUTION) ×2 IMPLANT

## 2023-03-17 NOTE — Addendum Note (Signed)
Addendum  created 03/17/23 1216 by Lysbeth Penner, CRNA   Intraprocedure Event edited

## 2023-03-17 NOTE — Transfer of Care (Signed)
Immediate Anesthesia Transfer of Care Note  Patient: Tamara Mcclain  Procedure(s) Performed: RIGHT L5-S1 MICRODISCECTOMY (Right: Spine Lumbar)  Patient Location: PACU  Anesthesia Type:General  Level of Consciousness: drowsy and patient cooperative  Airway & Oxygen Therapy: Patient Spontanous Breathing and Patient connected to face mask oxygen  Post-op Assessment: Report given to RN and Post -op Vital signs reviewed and stable  Post vital signs: Reviewed and stable  Last Vitals:  Vitals Value Taken Time  BP 130/74 03/17/23 1035  Temp    Pulse 92 03/17/23 1039  Resp 21 03/17/23 1039  SpO2 100 % 03/17/23 1039  Vitals shown include unfiled device data.  Last Pain:  Vitals:   03/17/23 0725  TempSrc: Temporal  PainSc: 6          Complications: There were no known notable events for this encounter.

## 2023-03-17 NOTE — Op Note (Signed)
Indications: [name] is suffering from lumbar radiculopathy. The patient tried and failed conservative management, prompting surgical intervention.  Findings: fibrous disc herniation  Preoperative Diagnosis: Lumbar radiculopathy (ICD-10 M54.16) Postoperative Diagnosis: same   EBL: 10 ml IVF: see anesthesia record Drains: none Disposition: Extubated and Stable to PACU Complications: none  No foley catheter was placed.   Preoperative Note:   Risks of surgery discussed include: infection, bleeding, stroke, coma, death, paralysis, CSF leak, nerve/spinal cord injury, numbness, tingling, weakness, complex regional pain syndrome, recurrent stenosis and/or disc herniation, vascular injury, development of instability, neck/back pain, need for further surgery, persistent symptoms, development of deformity, and the risks of anesthesia. The patient understood these risks and agreed to proceed.  Operative Note:   1) Right L5/s1 microdiscectomy  The patient was then brought from the preoperative center with intravenous access established.  The patient underwent general anesthesia and endotracheal tube intubation, and was then rotated on the [place] rail top where all pressure points were appropriately padded.  The skin was then thoroughly cleansed.  Perioperative antibiotic prophylaxis was administered.  Sterile prep and drapes were then applied and a timeout was then observed.  C-arm was brought into the field under sterile conditions, and the L5-S1 disc space identified and marked with an incision on the right 1cm lateral to midline. The prominent S1/2 disc was identified.  Once this was complete a 2 cm incision was opened with the use of a #10 blade knife.  The Metrx tubes were sequentially advanced under lateral fluoroscopy until a 18 x 70 mm Metrx tube was placed over the facet and lamina and secured to the bed.    The microscope was then sterilely brought into the field and muscle creep  was hemostased with a bipolar and resected with a pituitary rongeur.  A Bovie extender was then used to expose the spinous process and lamina.  Careful attention was placed to not violate the facet capsule. A 3 mm matchstick drill bit was then used to make a hemi-laminotomy trough until the ligamentum flavum was exposed.  This was extended to the base of the spinous process.  Once this was complete and the underlying ligamentum flavum was visualized, the ligamentum was dissected with an up angle curette and resected with a #2 and #3 mm biting Kerrison.  The laminotomy opening was also expanded in similar fashion and hemostasis was obtained with Surgifoam and a patty as well as bone wax.  The rostral aspect of the caudal level of the lamina was also resected with a #2 biting Kerrison effort to further enhance exposure.  Once the underlying dura was visualized a Penfield 4 was then used to dissect and expose the traversing nerve root.  Once this was identified a nerve root retractor suction was used to mobilize this medially.  The venous plexus was hemostased with Surgifoam and light bipolar use.  A small penfied was then used to make a small annulotomy within the disc space and disc space contents were noted to come through the annulus.    The disc herniation was identified and dissected free using a balltip probe. The pituitary rongeur was used to remove the extruded disc fragments. There was significant fibrous nature of the disc herniation requiring removal in piecement fashion. Once the thecal sac and nerve root were noted to be relaxed and under less tension the ball-tipped feeler was passed along the foramen distally to ensure no residual compression was noted.    The area was irrigated. The  tube system was then removed under microscopic visualization and hemostasis was obtained with a bipolar.    The fascial layer was reapproximated with the use of a 0- Vicryl suture.  Subcutaneous tissue layer was  reapproximated using 2-0 Vicryl suture.  3-0 monocryl was used on the skin. The skin was then cleansed and Dermabond was used to close the skin opening.  Patient was then rotated back to the preoperative bed awakened from anesthesia and taken to recovery all counts are correct in this case.   I performed the entire procedure with the assistance of Manning Charity PA as an Designer, television/film set. An assistant was required for this procedure due to the complexity.  The assistant provided assistance in tissue manipulation and suction, and was required for the successful and safe performance of the procedure. I performed the critical portions of the procedure.   Venetia Night MD

## 2023-03-17 NOTE — Anesthesia Preprocedure Evaluation (Signed)
Anesthesia Evaluation  Patient identified by MRN, date of birth, ID band Patient awake    Reviewed: Allergy & Precautions, NPO status , Patient's Chart, lab work & pertinent test results  History of Anesthesia Complications Negative for: history of anesthetic complications  Airway Mallampati: III  TM Distance: >3 FB Neck ROM: full    Dental no notable dental hx.    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 Janee Morn at [facility]: Von Willebrand disease [facility])     Comment:  diagnosed when teenager due to large menstrual blood               clots; after seeing hematology 2024, levels did not               indicate the disease  Past Surgical History: 01/30/2020: ESOPHAGOGASTRODUODENOSCOPY (EGD) WITH PROPOFOL; N/A     Comment:  Procedure: ESOPHAGOGASTRODUODENOSCOPY (EGD) WITH               PROPOFOL;  Surgeon: Wyline Mood, MD;  Location: [facility]               ENDOSCOPY;  Service: Gastroenterology;  Laterality: N/A; 08/2018: WISDOM TOOTH EXTRACTION     Comment:  no bleeding complications  BMI    Body Mass Index: 36.13 kg/m      Reproductive/Obstetrics negative OB ROS                             Anesthesia Physical Anesthesia Plan  ASA: 2  Anesthesia Plan:  General ETT   Post-op Pain Management: Toradol IV (intra-op)*, Ofirmev IV (intra-op)*, Ketamine IV* and Dilaudid IV   Induction: Intravenous  PONV Risk Score and Plan: 3 and Ondansetron, Dexamethasone, Midazolam and Treatment may vary due to age or medical condition  Airway Management Planned: Oral ETT  Additional Equipment:   Intra-op Plan:   Post-operative Plan: Extubation in OR  Informed Consent: I have reviewed the patients History and Physical, chart, labs and discussed the procedure including the risks, benefits and alternatives for the proposed anesthesia with the patient or authorized representative who has indicated his/her understanding and acceptance.     Dental Advisory Given  Plan Discussed with: Anesthesiologist, CRNA and Surgeon  Anesthesia Plan Comments: (Patient consented for risks of anesthesia including but not limited to:  - adverse reactions to medications - damage to eyes, teeth, lips or other oral mucosa - nerve damage due to positioning  - sore throat or hoarseness - Damage to heart, brain, nerves, lungs, other parts of body or loss of life  Patient voiced understanding and assent.)        Anesthesia Quick Evaluation

## 2023-03-17 NOTE — Progress Notes (Signed)
Patient able to eat, drink ,ambulate and void, Discharged home with no new pain or weakness

## 2023-03-17 NOTE — Anesthesia Postprocedure Evaluation (Signed)
Anesthesia Post Note  Patient: Tamara Mcclain  Procedure(s) Performed: RIGHT L5-S1 MICRODISCECTOMY (Right: Spine Lumbar)  Patient location during evaluation: PACU Anesthesia Type: General Level of consciousness: awake and alert Pain management: pain level controlled Vital Signs Assessment: post-procedure vital signs reviewed and stable Respiratory status: spontaneous breathing, nonlabored ventilation, respiratory function stable and patient connected to nasal cannula oxygen Cardiovascular status: blood pressure returned to baseline and stable Postop Assessment: no apparent nausea or vomiting Anesthetic complications: no   There were no known notable events for this encounter.   Last Vitals:  Vitals:   03/17/23 1052 03/17/23 1100  BP:  (!) 149/87  Pulse: 100 94  Resp: 14 14  Temp:    SpO2: 100% 99%    Last Pain:  Vitals:   03/17/23 1052  TempSrc:   PainSc: Asleep                 Louie Boston

## 2023-03-17 NOTE — Discharge Instructions (Addendum)
Your surgeon has performed an operation on your lumbar spine (low back) to relieve pressure on one or more nerves. Many times, patients feel better immediately after surgery and can "overdo it." Even if you feel well, it is important that you follow these activity guidelines. If you do not let your back heal properly from the surgery, you can increase the chance of a disc herniation and/or return of your symptoms. The following are instructions to help in your recovery once you have been discharged from the hospital.  * It is ok to take NSAIDs after surgery.  Activity    No bending, lifting, or twisting ("BLT"). Avoid lifting objects heavier than 10 pounds (gallon milk jug).  Where possible, avoid household activities that involve lifting, bending, pushing, or pulling such as laundry, vacuuming, grocery shopping, and childcare. Try to arrange for help from friends and family for these activities while your back heals.  Increase physical activity slowly as tolerated.  Taking short walks is encouraged, but avoid strenuous exercise. Do not jog, run, bicycle, lift weights, or participate in any other exercises unless specifically allowed by your doctor. Avoid prolonged sitting, including car rides.  Talk to your doctor before resuming sexual activity.  You should not drive until cleared by your doctor.  Until released by your doctor, you should not return to work or school.  You should rest at home and let your body heal.   You may shower three days after your surgery.  After showering, lightly dab your incision dry. Do not take a tub bath or go swimming for 3 weeks, or until approved by your doctor at your follow-up appointment.  If you smoke, we strongly recommend that you quit.  Smoking has been proven to interfere with normal healing in your back and will dramatically reduce the success rate of your surgery. Please contact [facility] (800-QUIT-NOW) and use the resources at www.[facility].com for  assistance in stopping smoking.  Surgical Incision   If you have a dressing on your incision, you may remove it three days after your surgery. Keep your incision area clean and dry.  If you have staples or stitches on your incision, you should have a follow up scheduled for removal. If you do not have staples or stitches, you will have steri-strips (small pieces of surgical tape) or Dermabond glue. The steri-strips/glue should begin to peel away within about a week (it is fine if the steri-strips fall off before then). If the strips are still in place one week after your surgery, you may gently remove them.  Diet            You may return to your usual diet. Be sure to stay hydrated.  When to Contact Tamara Mcclain  Although your surgery and recovery will likely be uneventful, you may have some residual numbness, aches, and pains in your back and/or legs. This is normal and should improve in the next few weeks.  However, should you experience any of the following, contact Tamara Mcclain immediately: New numbness or weakness Pain that is progressively getting worse, and is not relieved by your pain medications or rest Bleeding, redness, swelling, pain, or drainage from surgical incision Chills or flu-like symptoms Fever greater than 101.0 F (38.3 C) Problems with bowel or bladder functions Difficulty breathing or shortness of breath Warmth, tenderness, or swelling in your calf  Contact Information How to contact Tamara Mcclain:  If you have any questions/concerns before or after surgery, you can reach Tamara Mcclain at [phone], or you can  send a [employer]. We can be reached by phone or mychart 8am-4pm, Monday-Friday.  *Please note: Calls after 4pm are forwarded to a third party answering service. Mychart messages are not routinely monitored during evenings, weekends, and holidays. Please call our office to contact the answering service for urgent concerns during non-business hours.

## 2023-03-17 NOTE — Discharge Summary (Signed)
Discharge Summary  Patient ID: Tamara Mcclain MRN: [id number] DOB/AGE: [birth year] 23 y.o.  Admit date: 03/17/2023 Discharge date: 03/17/2023  Admission Diagnoses: Lumbar radiculopathy (ICD-10 M54.16)  Discharge Diagnoses:  Active Problems:   Lumbar radiculopathy   Discharged Condition: good  Hospital Course:  Tamara Mcclain is a 22 year old presenting with right-sided lumbar radiculopathy status post right L5-S1 microdiscectomy.  Her intraoperative course was uncomplicated.  She was monitored in PACU and discharged home after ambulating, urinating, and tolerating p.o. intake.  She was given discharge instructions and prescriptions for pain medication, muscle relaxer, and stool softener.  Consults: None  Significant Diagnostic Studies: none  Treatments: surgery: As above.  Please see separately dictated operative report for further details.  Discharge Exam: Blood pressure (!) 141/99, pulse 92, temperature 97.9 F (36.6 C), temperature source Temporal, resp. rate 18, height 5' (1.524 m), weight 83.9 kg, SpO2 100%, not currently breastfeeding. Cranial nerves grossly intact MAEW Incision c/d/I with post-op dressing in place  Disposition: Discharge disposition: 01-Home or Self Care        Allergies as of 03/17/2023       Reactions   Tylenol [acetaminophen] Other (See Comments)   fever   Depo-medrol [methylprednisolone] Nausea Only   Feeling flushed        Medication List     TAKE these medications    albuterol 108 (90 Base) MCG/ACT inhaler Commonly known as: VENTOLIN HFA Inhale 1-2 puffs into the lungs every 6 (six) hours as needed for wheezing or shortness of breath.   ibuprofen 200 MG tablet Commonly known as: ADVIL Take 400 mg by mouth every 8 (eight) hours as needed (pain.).   levonorgestrel 20 MCG/DAY Iud Commonly known as: MIRENA 1 each by Intrauterine route once.   methocarbamol 500 MG tablet Commonly known as: ROBAXIN Take 1 tablet (500 mg total)  by mouth every 6 (six) hours as needed for muscle spasms.   oxyCODONE 5 MG immediate release tablet Commonly known as: Roxicodone Take 1 tablet (5 mg total) by mouth every 4 (four) hours as needed for up to 5 days for severe pain (pain score 7-10).   senna 8.6 MG Tabs tablet Commonly known as: SENOKOT Take 1 tablet (8.6 mg total) by mouth 2 (two) times daily as needed for mild constipation.        Follow-up Information     Susanne Borders, PA Follow up on 03/30/2023.   Specialty: Neurosurgery Contact information: [address] [place] [id number] [phone]                 Signed: Susanne Borders 03/17/2023, 10:22 AM

## 2023-03-17 NOTE — Interval H&P Note (Signed)
History and Physical Interval Note:  03/17/2023 8:10 AM  Tamara Mcclain [place]  has presented today for surgery, with the diagnosis of M54.16 lumbar radiculopathy.  The various methods of treatment have been discussed with the patient and family. After consideration of risks, benefits and other options for treatment, the patient has consented to  Procedure(s): RIGHT L5-S1 MICRODISCECTOMY (Right) as a surgical intervention.  The patient's history has been reviewed, patient examined, no change in status, stable for surgery.  I have reviewed the patient's chart and labs.  Questions were answered to the patient's satisfaction.    Heart sounds normal no MRG. Chest Clear to Auscultation Bilaterally.   Tamara Mcclain

## 2023-03-17 NOTE — Anesthesia Procedure Notes (Signed)
Procedure Name: Intubation Date/Time: 03/17/2023 8:35 AM  Performed by: Lysbeth Penner, CRNAPre-anesthesia Checklist: Patient identified, Emergency Drugs available, Suction available and Patient being monitored Patient Re-evaluated:Patient Re-evaluated prior to induction Oxygen Delivery Method: Circle system utilized Preoxygenation: Pre-oxygenation with 100% oxygen Induction Type: IV induction Ventilation: Mask ventilation without difficulty Laryngoscope Size: McGraph and 3 Grade View: Grade I Tube type: Oral Tube size: 6.5 mm Number of attempts: 1 Airway Equipment and Method: Stylet and Video-laryngoscopy Placement Confirmation: ETT inserted through vocal cords under direct vision, positive ETCO2 and breath sounds checked- equal and bilateral Secured at: 21 cm Tube secured with: Tape Dental Injury: Teeth and Oropharynx as per pre-operative assessment  Comments: Performed by SRNA with supervision from CRNA and MDA

## 2023-03-18 ENCOUNTER — Other Ambulatory Visit: Payer: Self-pay | Admitting: Neurosurgery

## 2023-03-18 ENCOUNTER — Telehealth: Payer: Self-pay | Admitting: Neurosurgery

## 2023-03-18 ENCOUNTER — Other Ambulatory Visit: Payer: BC Managed Care – PPO

## 2023-03-18 ENCOUNTER — Encounter: Payer: Self-pay | Admitting: Neurosurgery

## 2023-03-18 MED ORDER — METHYLPREDNISOLONE 4 MG PO TBPK: ORAL_TABLET | ORAL | 0 refills | Status: DC

## 2023-03-18 MED ORDER — TIZANIDINE HCL 4 MG PO TABS: 4.0000 mg | ORAL_TABLET | Freq: Three times a day (TID) | ORAL | 0 refills | Status: DC | PRN

## 2023-03-18 NOTE — Telephone Encounter (Signed)
  Media Information    Document Information  Was this taken care of?

## 2023-03-18 NOTE — Telephone Encounter (Signed)
I spoke with the patient. She reports right hip pain when she lays down and pain when she gets up. She states she has to stand in place for almost 10 minutes before she moves.   I reviewed that it is not uncommon for the recovery period to be like a roller coaster with good days and bad days, particularly because her symptoms have been present for 3 years.  She is taking oxycodone and methocarbamol as prescribed. She is waiting for [facility] to fill the stool softener.   We discussed that Dr Myer Haff suggested a medrol dosepack if the pain feels bad enough. I also reviewed that methylprednisolone is on her allergy list as causing nausea and feeling flushed, but that these can be common side effects of steroids.  She requested to wait and see how she feels today and call back tomorrow if she decides to try a medrol dosepack.  I also advised trying heat and/or ice on her hip area. She is aware that if she puts heat or ice near her incision, she should have a barrier and not put it directly on her incision.

## 2023-03-18 NOTE — Telephone Encounter (Signed)
I will call her to discuss. I reviewed this message w/ Dr Myer Haff and he said it is OK to give her a medrol dosepack if her pain is not under control w/ current regimen.

## 2023-03-18 NOTE — Telephone Encounter (Signed)
It appears this was taken care of. Can you call her this morning and make sure she was able to pick up her pain medicine and muscle relaxers? Thanks

## 2023-03-19 NOTE — Telephone Encounter (Signed)
Media Information                      Document Information

## 2023-03-23 ENCOUNTER — Other Ambulatory Visit: Payer: BC Managed Care – PPO

## 2023-03-30 ENCOUNTER — Ambulatory Visit (INDEPENDENT_AMBULATORY_CARE_PROVIDER_SITE_OTHER): Payer: BC Managed Care – PPO | Admitting: Neurosurgery

## 2023-03-30 ENCOUNTER — Encounter: Payer: Self-pay | Admitting: Neurosurgery

## 2023-03-30 ENCOUNTER — Encounter: Payer: BC Managed Care – PPO | Admitting: Neurosurgery

## 2023-03-30 VITALS — BP 128/78 | Temp 97.9°F | Ht 60.0 in | Wt 185.0 lb

## 2023-03-30 DIAGNOSIS — M5416 Radiculopathy, lumbar region: Secondary | ICD-10-CM

## 2023-03-30 DIAGNOSIS — Z09 Encounter for follow-up examination after completed treatment for conditions other than malignant neoplasm: Secondary | ICD-10-CM

## 2023-03-30 NOTE — Progress Notes (Signed)
   DOS: 03/17/2023 (R L5/S1 microdiscectomy)  HISTORY OF PRESENT ILLNESS: 03/30/2023 [name] is status post microdiscectomy.  Her right pain is essentially gone.  She has some discomfort around her right hip.  She has minimal back pain.  PHYSICAL EXAMINATION:  There were no vitals filed for this visit. General: Patient is well developed, well nourished, calm, collected, and in no apparent distress.  NEUROLOGICAL:  General: In no acute distress.  Awake, alert, oriented to person, place, and time. Pupils equal round and reactive to light.   Strength:  Side Iliopsoas Quads Hamstring PF DF EHL  R 5 5 5 5 5 5   L 5 5 5 5 5 5    Incision c/d/i   ROS (Neurologic): Negative except as noted above  IMAGING: No interval imaging to review   ASSESSMENT/PLAN:  Tamara Mcclain is doing well after microdiscectomy.  She has some residual nerve irritation, but I suspect that will improve with time.  We reviewed her activity limitations.  I will see her back in approximately 4 weeks.   Venetia Night MD, [facility] Department of Neurosurgery

## 2023-03-31 ENCOUNTER — Encounter: Payer: Self-pay | Admitting: Neurosurgery

## 2023-04-02 ENCOUNTER — Encounter: Payer: BC Managed Care – PPO | Admitting: Orthopedic Surgery

## 2023-04-13 ENCOUNTER — Encounter: Payer: BC Managed Care – PPO | Admitting: Neurosurgery

## 2023-04-17 ENCOUNTER — Encounter: Payer: Self-pay | Admitting: Neurosurgery

## 2023-04-19 ENCOUNTER — Encounter: Payer: Self-pay | Admitting: Orthopedic Surgery

## 2023-04-19 ENCOUNTER — Ambulatory Visit: Payer: BC Managed Care – PPO | Admitting: Orthopedic Surgery

## 2023-04-19 VITALS — BP 126/88 | Temp 97.8°F | Ht 60.0 in | Wt 185.0 lb

## 2023-04-19 DIAGNOSIS — Z9889 Other specified postprocedural states: Secondary | ICD-10-CM

## 2023-04-19 DIAGNOSIS — Z09 Encounter for follow-up examination after completed treatment for conditions other than malignant neoplasm: Secondary | ICD-10-CM

## 2023-04-19 DIAGNOSIS — M5416 Radiculopathy, lumbar region: Secondary | ICD-10-CM

## 2023-04-19 NOTE — Progress Notes (Signed)
   DOS: 03/17/2023 (R L5/S1 microdiscectomy)  HISTORY OF PRESENT ILLNESS: 04/19/2023 [name] called into today with some concerns about her incision. She is here for follow up.   She is doing great with no back or leg pain. No numbness, tingling, or weakness.   Felt a stitch coming out of her incision and was concerned. No fevers or chills. No wound drainage.   Not taking any pain medications.   PHYSICAL EXAMINATION:  There were no vitals filed for this visit. General: Patient is well developed, well nourished, calm, collected, and in no apparent distress.  NEUROLOGICAL:  General: In no acute distress.  Awake, alert, oriented to person, place, and time. Pupils equal round and reactive to light.   Strength:  Side Iliopsoas Quads Hamstring PF DF EHL  R 5 5 5 5 5 5   L 5 5 5 5 5 5    Incision well healed. At the very bottom of the incision, she has a stitch working it's way out. I cleaned incision with chloraprep and tried to pull it out, but not enough is out to grab and cut.  No surround erythema or fluctuance.   ROS (Neurologic): Negative except as noted above  IMAGING: No interval imaging to review   ASSESSMENT/PLAN:  Tamara Mcclain is doing well after microdiscectomy. She has small stitch abscess. No signs of infection. She has no back or leg pain.   Treatment options reviewed with patient and following plan made:   - Continue with no bending, twisting, or lifting.  - Reviewed stitch abscess. This will work it's way out. She will cover with water proof band aid when showering.  - Reviewed signs of infection.  - Keep for scheduled f/u on 04/27/23 for wound check.   Advised to contact the office if any questions or concerns arise.  Drake Leach PA-C Department of Neurosurgery

## 2023-04-19 NOTE — Telephone Encounter (Signed)
Patient confirmed appt for 930am today

## 2023-04-19 NOTE — Telephone Encounter (Signed)
Can you see if she can see me at 9 or 9:30 today please? If not, Nehemiah Settle has some openings in the afternoon.   Please let me know.

## 2023-04-27 ENCOUNTER — Encounter: Payer: BC Managed Care – PPO | Admitting: Neurosurgery

## 2023-04-27 ENCOUNTER — Encounter: Payer: BC Managed Care – PPO | Admitting: Physician Assistant

## 2023-04-29 ENCOUNTER — Encounter: Payer: BC Managed Care – PPO | Admitting: Orthopedic Surgery

## 2023-04-29 ENCOUNTER — Encounter: Payer: BC Managed Care – PPO | Admitting: Neurosurgery

## 2023-04-29 ENCOUNTER — Ambulatory Visit: Payer: Self-pay | Admitting: Maternal & Fetal Medicine

## 2023-04-30 ENCOUNTER — Encounter: Payer: BC Managed Care – PPO | Admitting: Internal Medicine

## 2023-04-30 NOTE — Progress Notes (Deleted)
Subjective:    Patient ID: Tamara Mcclain, female    DOB: June 26, 2000, 22 y.o.   MRN: [id number]  HPI  Patient presents to clinic today for her annual exam.  Flu: 01/2023 Tetanus: 11/2022 COVID: Moderna x2 Pap smear: 08/2020 Dentist: biannually  Diet: She does eat meat occasionally. She does not consume fruits or veggies. She does eat fried foods. She drinks mostly water, soda. Exercise: None  Review of Systems     Past Medical History:  Diagnosis Date   Anxiety    Asthma    mild; flares up only when sick   Epiploic appendagitis    Lumbosacral disc herniation    L5-S1   MTHFR mutation    Primary hypertension    Von Willebrand disease (HCC) Dr. Janee Morn at [facility]   diagnosed when teenager due to large menstrual blood clots; after seeing hematology 2024, levels did not indicate the disease    Current Outpatient Medications  Medication Sig Dispense Refill   albuterol (VENTOLIN HFA) 108 (90 Base) MCG/ACT inhaler Inhale 1-2 puffs into the lungs every 6 (six) hours as needed for wheezing or shortness of breath. 8 g 2   ibuprofen (ADVIL) 200 MG tablet Take 400 mg by mouth every 8 (eight) hours as needed (pain.).     levonorgestrel (MIRENA) 20 MCG/DAY IUD 1 each by Intrauterine route once.     No current facility-administered medications for this visit.    Allergies  Allergen Reactions   Tylenol [Acetaminophen] Other (See Comments)    fever   Depo-Medrol [Methylprednisolone] Nausea Only    Feeling flushed    Family History  Problem Relation Age of Onset   Hypertension Father    Pulmonary embolism Father    Cancer Maternal Grandmother 29       breast   Arthritis Maternal Grandmother    Hyperlipidemia Maternal Grandmother    CAD Maternal Grandfather    Cancer Paternal Grandmother        melanoma, sinus cancer   Colon polyps Paternal Grandmother    Heart disease Paternal Grandfather    Gaucher's disease Half-Brother    Esophageal cancer Neg Hx    Stomach cancer  Neg Hx    Liver disease Neg Hx     Social History   Socioeconomic History   Marital status: Married    Spouse name: Maigen Suthar.   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  Other Topics Concern   Not on file  Social History Narrative   Not on file   Social Drivers of Health   Financial Resource Strain: Low Risk  (10/23/2022)   Overall Financial Resource Strain (CARDIA)    Difficulty of Paying Living Expenses: Not hard at all  Food Insecurity: No Food Insecurity (10/23/2022)   Hunger Vital Sign    Worried About Running Out of Food in the Last Year: Never true    Ran Out of Food in the Last Year: Never  true  Transportation Needs: No Transportation Needs (10/23/2022)   PRAPARE - Administrator, Civil Service (Medical): No    Lack of Transportation (Non-Medical): No  Physical Activity: Insufficiently Active (10/23/2022)   Exercise Vital Sign    Days of Exercise per Week: 2 days    Minutes of Exercise per Session: 20 min  Stress: No Stress Concern Present (10/23/2022)   [employer] of Occupational Health - Occupational Stress Questionnaire    Feeling of Stress : Only a little  Social Connections: Moderately Integrated (10/23/2022)   Social Connection and Isolation Panel [NHANES]    Frequency of Communication with Friends and Family: More than three times a week    Frequency of Social Gatherings with Friends and Family: Twice a week    Attends Religious Services: 1 to 4 times per year    Active Member  of [employer] or Organizations: No    Attends Engineer, structural: Not on file    Marital Status: Married  Catering manager Violence: Not At Risk (08/23/2022)   Humiliation, Afraid, Rape, and Kick questionnaire    Fear of Current or Ex-Partner: No    Emotionally Abused: No    Physically Abused: No    Sexually Abused: No     Constitutional: Denies fever, malaise, fatigue, headache or abrupt weight changes.  HEENT: Denies eye pain, eye redness, ear pain, ringing in the ears, wax buildup, runny nose, nasal congestion, bloody nose, or sore throat. Respiratory: Denies difficulty breathing, shortness of breath, cough or sputum production.   Cardiovascular: Denies chest pain, chest tightness, palpitations or swelling in the hands or feet.  Gastrointestinal: Patient reports intermittent abdominal discomfort.  Denies bloating, constipation, diarrhea or blood in the stool.  GU: Denies urgency, frequency, pain with urination, burning sensation, blood in urine, odor or discharge. Musculoskeletal: Patient reports low back pain.  Denies decrease in range of motion, difficulty with gait, muscle pain or joint swelling.  Skin: Denies redness, rashes, lesions or ulcercations.  Neurological: Denies dizziness, difficulty with memory, difficulty with speech or problems with balance and coordination.  Psych: Patient has a history of anxiety.  Denies depression, SI/HI.  No other specific complaints in a complete review of systems (except as listed in HPI above).  Objective:   Physical Exam  There were no vitals taken for this visit.  Wt Readings from Last 3 Encounters:  04/19/23 185 lb (83.9 kg)  03/30/23 185 lb (83.9 kg)  03/17/23 185 lb (83.9 kg)    General: Appears her stated age, obese, in NAD. Skin: Warm, dry and intact.  HEENT: Head: normal shape and size; Eyes: sclera white, no icterus, conjunctiva Mcclain, PERRLA and EOMs intact;  Neck:  Neck supple, trachea midline. No masses, lumps or  thyromegaly present.  Cardiovascular: Normal rate and rhythm. S1,S2 noted.  No murmur, rubs or gallops noted. No JVD or BLE edema.  Pulmonary/Chest: Normal effort and positive vesicular breath sounds. No respiratory distress. No wheezes, rales or ronchi noted.  Abdomen: Soft and nontender. Normal bowel sounds.  Musculoskeletal: Strength 5/5 BUE/BLE.  No difficulty with gait.  Neurological: Alert and oriented. Cranial nerves II-XII grossly intact. Coordination normal.  Psychiatric: Mood and affect normal. Behavior is normal. Judgment and thought content normal.    BMET    Component Value Date/Time   NA 138 12/15/2022 1030   NA 136 01/28/2022 1047   K 4.3 12/15/2022 1030   CL 106 12/15/2022 1030   CO2 24 12/15/2022 1030   GLUCOSE  99 12/15/2022 1030   BUN 7 12/15/2022 1030   BUN 4 (L) 01/28/2022 1047   CREATININE 0.69 12/15/2022 1030   CALCIUM 9.2 12/15/2022 1030   GFRNONAA >60 12/15/2022 1030   GFRAA >60 04/09/2018 2151    Lipid Panel     Component Value Date/Time   CHOL 168 11/27/2021 1428   TRIG 145 11/27/2021 1428   HDL 32 (L) 11/27/2021 1428   CHOLHDL 5.3 (H) 11/27/2021 1428   LDLCALC 110 (H) 11/27/2021 1428    CBC    Component Value Date/Time   WBC 9.8 12/15/2022 1030   RBC 5.03 12/15/2022 1030   HGB 13.0 12/15/2022 1030   HGB 11.8 06/09/2022 0926   HCT 39.7 12/15/2022 1030   HCT 36.2 06/09/2022 0926   PLT 397 12/15/2022 1030   PLT 324 06/09/2022 0926   MCV 78.9 (L) 12/15/2022 1030   MCV 90 06/09/2022 0926   MCH 25.8 (L) 12/15/2022 1030   MCHC 32.7 12/15/2022 1030   RDW 13.5 12/15/2022 1030   RDW 13.4 06/09/2022 0926   LYMPHSABS 1.5 06/09/2022 0926   MONOABS 0.6 07/24/2021 1612   EOSABS 0.0 06/09/2022 0926   BASOSABS 0.0 06/09/2022 0926    Hgb A1C Lab Results  Component Value Date   HGBA1C 5.3 01/28/2022            Assessment & Plan:   Preventative Health Maintenance:  Flu shot UTD Tetanus UTD Encouraged her to get a COVID booster Pap  smear UTD Encouraged her to consume a balanced diet and exercise regimen We will check CBC, c-Met, lipid and A1c today  RTC in 6 months, follow-up chronic conditions Nicki Reaper, NP

## 2023-05-06 ENCOUNTER — Ambulatory Visit
Admission: EM | Admit: 2023-05-06 | Discharge: 2023-05-06 | Disposition: A | Discharge status: Home / Self Care | Payer: BC Managed Care – PPO | Attending: Emergency Medicine | Admitting: Emergency Medicine

## 2023-05-06 DIAGNOSIS — H6503 Acute serous otitis media, bilateral: Secondary | ICD-10-CM

## 2023-05-06 MED ORDER — AMOXICILLIN-POT CLAVULANATE 875-125 MG PO TABS: 1.0000 | ORAL_TABLET | Freq: Two times a day (BID) | ORAL | 0 refills | Status: AC

## 2023-05-06 NOTE — ED Triage Notes (Signed)
Pt present bilateral ear clogged. Symptoms started last Friday. She also complain of stuffy nose.

## 2023-05-06 NOTE — ED Provider Notes (Signed)
Renaldo Fiddler    CSN: [id number] Arrival date & time: 05/06/23  1108      History   Chief Complaint Chief Complaint  Patient presents with   Ear Fullness    HPI Tamara Mcclain is a 22 y.o. female.   Patient presents for evaluation of bilateral ear pain, fullness and decreased hearing beginning 7 days ago.  Recent viral illness 1 week ago with cough and congestion, has improved.  Denies ear drainage but endorses a sensation of fluid within the ear.  Is been attempting over-the-counter medication.  Past Medical History:  Diagnosis Date   Anxiety    Asthma    mild; flares up only when sick   Epiploic appendagitis    Lumbosacral disc herniation    L5-S1   MTHFR mutation    Primary hypertension    Von Willebrand disease (HCC) Dr. Janee Morn at [facility]   diagnosed when teenager due to large menstrual blood clots; after seeing hematology 2024, levels did not indicate the disease    Patient Active Problem List   Diagnosis Date Noted   Lumbar radiculopathy 03/17/2023   GAD (generalized anxiety disorder) 10/27/2022   Pure hypercholesterolemia 10/27/2022   Asthma 02/10/2022   High blood pressure 11/10/2021   Class 2 obesity due to excess calories with body mass index (BMI) of 36.0 to 36.9 in adult 11/10/2021   Panic attacks 12/05/2019   Epiploic appendagitis 06/28/2019   Von Willebrand disease (HCC) 08/18/2016    Past Surgical History:  Procedure Laterality Date   ESOPHAGOGASTRODUODENOSCOPY (EGD) WITH PROPOFOL N/A 01/30/2020   Procedure: ESOPHAGOGASTRODUODENOSCOPY (EGD) WITH PROPOFOL;  Surgeon: Wyline Mood, MD;  Location: [facility] ENDOSCOPY;  Service: Gastroenterology;  Laterality: N/A;   LUMBAR LAMINECTOMY/DECOMPRESSION MICRODISCECTOMY Right 03/17/2023   Procedure: RIGHT L5-S1 MICRODISCECTOMY;  Surgeon: Venetia Night, MD;  Location: [facility] ORS;  Service: Neurosurgery;  Laterality: Right;   WISDOM TOOTH EXTRACTION  08/2018   no bleeding complications    OB History      Gravida  3   Para  1   Term  1   Preterm      AB  2   Living  1      SAB  2   IAB      Ectopic      Multiple  0   Live Births  1            Home Medications    Prior to Admission medications   Medication Sig Start Date End Date Taking? Authorizing Provider  amoxicillin-clavulanate (AUGMENTIN) 875-125 MG tablet Take 1 tablet by mouth every 12 (twelve) hours for 10 days. 05/06/23 05/16/23 Yes Coy Vandoren R, NP  albuterol (VENTOLIN HFA) 108 (90 Base) MCG/ACT inhaler Inhale 1-2 puffs into the lungs every 6 (six) hours as needed for wheezing or shortness of breath. 03/16/23   Lorre Munroe, NP  ibuprofen (ADVIL) 200 MG tablet Take 400 mg by mouth every 8 (eight) hours as needed (pain.).    [provider]  levonorgestrel (MIRENA) 20 MCG/DAY IUD 1 each by Intrauterine route once.    [provider]    Family History Family History  Problem Relation Age of Onset   Hypertension Father    Pulmonary embolism Father    Cancer Maternal Grandmother 80       breast   Arthritis Maternal Grandmother    Hyperlipidemia Maternal Grandmother    CAD Maternal Grandfather    Cancer Paternal Grandmother  melanoma, sinus cancer   Colon polyps Paternal Grandmother    Heart disease Paternal Grandfather    Gaucher's disease Half-Brother    Esophageal cancer Neg Hx    Stomach cancer Neg Hx    Liver disease Neg Hx     Social History Social History   Tobacco Use   Smoking status: Never    Passive exposure: Past   Smokeless tobacco: Never  Vaping Use   Vaping status: Never Used  Substance Use Topics   Alcohol use: No    Alcohol/week: 0.0 standard drinks of alcohol   Drug use: No     Allergies   Tylenol [acetaminophen] and Depo-medrol [methylprednisolone]   Review of Systems Review of Systems   Physical Exam Triage Vital Signs ED Triage Vitals [05/06/23 1151]  Encounter Vitals Group     BP 134/87     Systolic BP Percentile       Diastolic BP Percentile      Pulse Rate 74     Resp 18     Temp 97.9 F (36.6 C)     Temp Source Oral     SpO2 98 %     Weight      Height      Head Circumference      Peak Flow      Pain Score 0     Pain Loc      Pain Education      Exclude from Growth Chart    No data found.  Updated Vital Signs BP 134/87 (BP Location: Left Arm)   Pulse 74   Temp 97.9 F (36.6 C) (Oral)   Resp 18   SpO2 98%   Visual Acuity Right Eye Distance:   Left Eye Distance:   Bilateral Distance:    Right Eye Near:   Left Eye Near:    Bilateral Near:     Physical Exam Constitutional:      Appearance: Normal appearance.  HENT:     Right Ear: Hearing, ear canal and external ear normal. Tympanic membrane is erythematous.     Left Ear: Hearing, ear canal and external ear normal. Tympanic membrane is erythematous.  Eyes:     Extraocular Movements: Extraocular movements intact.  Pulmonary:     Effort: Pulmonary effort is normal.  Neurological:     Mental Status: She is alert and oriented to person, place, and time.      UC Treatments / Results  Labs (all labs ordered are listed, but only abnormal results are displayed) Labs Reviewed - No data to display  EKG   Radiology No results found.  Procedures Procedures (including critical care time)  Medications Ordered in UC Medications - No data to display  Initial Impression / Assessment and Plan / UC Course  I have reviewed the triage vital signs and the nursing notes.  Pertinent labs & imaging results that were available during my care of the patient were reviewed by me and considered in my medical decision making (see chart for details).  Nonrecurrent acute serous otitis media of both ears  Erythema to the tympanic membrane is consistent with infection, congestion to the nasal turbinates otherwise stable exam, prescribed Augmentin ,advised against ear cleaning, may use over-the-counter analgesics and warm compresses to the  external ear for comfort, may follow-up if symptoms persist worsen or recur  Final Clinical Impressions(s) / UC Diagnoses   Final diagnoses:  Non-recurrent acute serous otitis media of both ears     Discharge Instructions  Today you are being treated for an infection of the eardrum  Take augmentin twice daily for 10 days, you should begin to see improvement after 48 hours of medication use and then it should progressively get better  You may use Tylenol or ibuprofen for management of discomfort  May hold warm compresses to the ear for additional comfort  Please not attempted any ear cleaning or object or fluid placement into the ear canal to prevent further irritation    ED Prescriptions     Medication Sig Dispense Auth. Provider   amoxicillin-clavulanate (AUGMENTIN) 875-125 MG tablet Take 1 tablet by mouth every 12 (twelve) hours for 10 days. 20 tablet Valinda Hoar, NP      PDMP not reviewed this encounter.   Valinda Hoar, NP 05/06/23 1520

## 2023-05-06 NOTE — Discharge Instructions (Signed)
Today you are being treated for an infection of the eardrum  Take augmentin twice daily for 10 days, you should begin to see improvement after 48 hours of medication use and then it should progressively get better  You may use Tylenol or ibuprofen for management of discomfort  May hold warm compresses to the ear for additional comfort  Please not attempted any ear cleaning or object or fluid placement into the ear canal to prevent further irritation

## 2023-05-10 DIAGNOSIS — H66002 Acute suppurative otitis media without spontaneous rupture of ear drum, left ear: Secondary | ICD-10-CM | POA: Diagnosis not present

## 2023-05-10 DIAGNOSIS — B9689 Other specified bacterial agents as the cause of diseases classified elsewhere: Secondary | ICD-10-CM | POA: Diagnosis not present

## 2023-05-10 DIAGNOSIS — R21 Rash and other nonspecific skin eruption: Secondary | ICD-10-CM | POA: Diagnosis not present

## 2023-05-10 DIAGNOSIS — J019 Acute sinusitis, unspecified: Secondary | ICD-10-CM | POA: Diagnosis not present

## 2023-05-11 ENCOUNTER — Encounter: Payer: BC Managed Care – PPO | Admitting: Neurosurgery

## 2023-05-11 ENCOUNTER — Ambulatory Visit: Payer: Self-pay | Admitting: Family Medicine

## 2023-05-12 ENCOUNTER — Encounter: Payer: Self-pay | Admitting: Internal Medicine

## 2023-05-13 ENCOUNTER — Encounter: Payer: BC Managed Care – PPO | Admitting: Neurosurgery

## 2023-05-20 ENCOUNTER — Encounter: Payer: BC Managed Care – PPO | Admitting: Neurosurgery

## 2023-06-03 ENCOUNTER — Ambulatory Visit: Payer: 59 | Admitting: Orthopedic Surgery

## 2023-06-03 ENCOUNTER — Encounter: Payer: Self-pay | Admitting: Orthopedic Surgery

## 2023-06-03 VITALS — BP 128/82 | Ht 60.0 in | Wt 185.0 lb

## 2023-06-03 DIAGNOSIS — Z9889 Other specified postprocedural states: Secondary | ICD-10-CM

## 2023-06-03 DIAGNOSIS — M5416 Radiculopathy, lumbar region: Secondary | ICD-10-CM

## 2023-06-03 DIAGNOSIS — R29898 Other symptoms and signs involving the musculoskeletal system: Secondary | ICD-10-CM

## 2023-06-03 MED ORDER — METHOCARBAMOL 500 MG PO TABS: 500.0000 mg | ORAL_TABLET | Freq: Three times a day (TID) | ORAL | 0 refills | Status: DC | PRN

## 2023-06-03 NOTE — Patient Instructions (Signed)
It was so nice to see you today. Thank you so much for coming in.    I want to get an MRI of your lower back to look into things further. We will get this approved through your insurance and [facility] will call you to schedule the appointment.   After you have the MRI, it takes 14-21 days for me to get the results back. Once I have them, we will call you to schedule a follow up visit with me to review them.  I also sent a prescription for methocarbamol to help with muscle spasms. Use only as needed and be careful, this can make you sleepy.   Please do not hesitate to call if you have any questions or concerns. You can also message me in MyChart.   Drake Leach PA-C [phone]     The physicians and staff at [facility] are committed to providing excellent care. You may receive a survey asking for feedback about your experience at our office. We value you your feedback and appreciate you taking the time to to fill it out. The [facility] leadership team is also available to discuss your experience in person, feel free to contact us [phone].

## 2023-06-03 NOTE — Progress Notes (Signed)
   DOS: 03/17/2023 (R L5/S1 microdiscectomy)  HISTORY OF PRESENT ILLNESS: Her preop right sided back and leg pain is gone.   She now has constant left sided LBP/buttock pain with left posterior/lateral leg pain to her foot. This pain started about a week after her surgery and has gotten worse in last 2 weeks. She notes weakness in left leg. This is the exact pain she was having on her right side. Pain is worse with getting up after prolonged sitting. She has pain with walking. She has occasional numbness in left leg.   She is taking prn motrin with minimal relief.   No bowel or bladder issues.   PHYSICAL EXAMINATION:   Vitals:   06/03/23 1336  BP: 128/82   Awake, alert, oriented to person, place, and time.  Speech is clear and fluent. Fund of knowledge is appropriate.   Cranial Nerves: Pupils equal round and reactive to light.  Facial tone is symmetric.    Well healed lumbar incision.   No abnormal lesions on exposed skin.   Strength: Side Biceps Triceps Deltoid Interossei Grip Wrist Ext. Wrist Flex.  R 5 5 5 5 5 5 5   L 5 5 5 5 5 5 5    Side Iliopsoas Quads Hamstring PF DF EHL  R 5 5 5 5 5 5   L 5 4- 5 5 5 5    Reflexes are 2+ and symmetric at the biceps, brachioradialis, patella and achilles.   Hoffman's is absent.  Clonus is not present.   Bilateral upper and lower extremity sensation is intact to light touch.     Gait is normal.    She can toe stand on left/right leg independently, but has pain when standing on left leg. She can heel stand.    ROS (Neurologic): Negative except as noted above  IMAGING: No interval imaging to review   ASSESSMENT/PLAN:  Tamara Mcclain did great after her surgery and her preop right sided back and leg pain is gone.   A few weeks after surgery, she noticed constant left sided LBP/buttock pain with left posterior/lateral leg pain to her foot. This pain has gotten worse in last 2 weeks and she notes weakness in left leg.   No recent lumbar  imaging. She does have some weakness in left leg on exam.   Treatment options reviewed with patient and following plan made:   - MRI of lumbar spine to further evaluate left leg radiculopathy and left leg weakness.  - New prescription for robaxin to use prn muscle spasms. Reviewed dosing and side effects. She knows this can make her sleepy.  - Follow up with me after I get her MRI results. She will likely need to be seen on office so I can check on her weakness.   Advised to contact the office if any questions or concerns arise.  Drake Leach PA-C Department of Neurosurgery

## 2023-06-04 NOTE — Addendum Note (Signed)
Addended byDrake Leach on: 06/04/2023 01:57 PM   Modules accepted: Orders

## 2023-06-08 ENCOUNTER — Encounter: Payer: BC Managed Care – PPO | Admitting: Neurosurgery

## 2023-06-15 ENCOUNTER — Encounter: Payer: BC Managed Care – PPO | Admitting: Neurosurgery

## 2023-06-18 ENCOUNTER — Telehealth: Payer: Self-pay

## 2023-06-18 NOTE — Telephone Encounter (Signed)
 Discussed with Lyle Butters, She states she will call the patient. She attempted to call the patient but her phone went to voicemail. She said that she read Stacy's last note and believes that there is not a lot we can do additionally other than give her a steroid taper. Patient has a scheduled MRI since seeing Stacy on 1/23. Agrees that the patient should go to the ER for evaluation.

## 2023-06-18 NOTE — Telephone Encounter (Signed)
 Patient called for advise regarding increased pain. Patient states that she is in significant pain that is causing her to have difficulty walking. She explains that this began last night and thought that it would get better over night. She woke up this morning still in significant pain with attempting to walk. States that she feels weaker on the left side since she saw Stacy on 06/02/2022. She endorses pain of 8/10 with additional comment that her back hurts more than before her surgery on 03/17/2023. She denies any issues with bowel and bladder. She asked for recommendation. Recommended that the patient go to the ER for evaluation due to new weakness and significant pain that causes trouble walking. Patient indicated understanding of recommendation. Discussed with Kendelyn RN, she is also in agreement that patient needs to go to the ER.

## 2023-06-21 ENCOUNTER — Ambulatory Visit
Admission: RE | Admit: 2023-06-21 | Discharge: 2023-06-21 | Disposition: A | Discharge status: Home / Self Care | Payer: 59 | Source: Ambulatory Visit | Attending: Orthopedic Surgery | Admitting: Orthopedic Surgery

## 2023-06-21 ENCOUNTER — Telehealth: Payer: Self-pay | Admitting: Neurosurgery

## 2023-06-21 DIAGNOSIS — M5416 Radiculopathy, lumbar region: Secondary | ICD-10-CM | POA: Insufficient documentation

## 2023-06-21 DIAGNOSIS — M5136 Other intervertebral disc degeneration, lumbar region with discogenic back pain only: Secondary | ICD-10-CM | POA: Diagnosis not present

## 2023-06-21 DIAGNOSIS — M5127 Other intervertebral disc displacement, lumbosacral region: Secondary | ICD-10-CM | POA: Diagnosis not present

## 2023-06-21 DIAGNOSIS — Z9889 Other specified postprocedural states: Secondary | ICD-10-CM | POA: Insufficient documentation

## 2023-06-21 DIAGNOSIS — R29898 Other symptoms and signs involving the musculoskeletal system: Secondary | ICD-10-CM | POA: Insufficient documentation

## 2023-06-21 DIAGNOSIS — M5137 Other intervertebral disc degeneration, lumbosacral region with discogenic back pain only: Secondary | ICD-10-CM | POA: Diagnosis not present

## 2023-06-21 DIAGNOSIS — M48061 Spinal stenosis, lumbar region without neurogenic claudication: Secondary | ICD-10-CM | POA: Diagnosis not present

## 2023-06-21 NOTE — Telephone Encounter (Signed)
 Right L5-S1 microdiscectomy on 03/17/23  Patient is calling to be seen by Dr.Yarbrough only. She has been in excruciating pain since Friday. The pain is over a 10. Her MRI is today at 6pm. Can you put that in as STAT so the results can come in asap. Pain increases when she tries to bend forward, backward or if she tries to lift her left leg.  She can not have any steroids because she feels like her throat starts to close if she takes them.

## 2023-06-22 ENCOUNTER — Telehealth: Payer: Self-pay | Admitting: Orthopedic Surgery

## 2023-06-22 ENCOUNTER — Ambulatory Visit (INDEPENDENT_AMBULATORY_CARE_PROVIDER_SITE_OTHER): Payer: 59 | Admitting: Neurosurgery

## 2023-06-22 ENCOUNTER — Encounter: Payer: BC Managed Care – PPO | Admitting: Neurosurgery

## 2023-06-22 VITALS — BP 136/86 | Ht 60.0 in | Wt 185.0 lb

## 2023-06-22 DIAGNOSIS — M5126 Other intervertebral disc displacement, lumbar region: Secondary | ICD-10-CM | POA: Diagnosis not present

## 2023-06-22 DIAGNOSIS — M5416 Radiculopathy, lumbar region: Secondary | ICD-10-CM | POA: Diagnosis not present

## 2023-06-22 DIAGNOSIS — Z9889 Other specified postprocedural states: Secondary | ICD-10-CM | POA: Diagnosis not present

## 2023-06-22 MED ORDER — TRAMADOL HCL 50 MG PO TABS
50.0000 mg | ORAL_TABLET | Freq: Four times a day (QID) | ORAL | 0 refills | Status: AC | PRN
Start: 2023-06-22 — End: 2023-06-27

## 2023-06-22 MED ORDER — GABAPENTIN 300 MG PO CAPS
300.0000 mg | ORAL_CAPSULE | Freq: Three times a day (TID) | ORAL | 0 refills | Status: DC
Start: 2023-06-22 — End: 2023-07-27

## 2023-06-22 NOTE — Telephone Encounter (Signed)
Dr. Myer Haff reviewed her recent lumbar MRI and she is scheduled to see him this afternoon.

## 2023-06-22 NOTE — Progress Notes (Signed)
   DOS: 03/17/2023 (R L5/S1 microdiscectomy)  HISTORY OF PRESENT ILLNESS: 06/22/2023 She is having severe pain down her left leg.  This is very similar to what her right leg pain was prior to her microdiscectomy.  She has pain in her buttock and down the posterior lateral aspect of her lower leg.  She has tried a steroid taper without improvement.   03/30/2023 Tamara Mcclain is status post microdiscectomy.  Her right pain is essentially gone.  She has some discomfort around her right hip.  She has minimal back pain.  PHYSICAL EXAMINATION:   Vitals:   06/22/23 1438  BP: 136/86   General: Patient is well developed, well nourished, calm, collected, and in no apparent distress.  NEUROLOGICAL:  General: In no acute distress.  Awake, alert, oriented to person, place, and time. Pupils equal round and reactive to light.   Strength:  Side Iliopsoas Quads Hamstring PF DF EHL  R 5 5 5 5 5 5   L 5 5 5 5 5 5    Incision c/d/i   Straight leg raise positive at 60 degrees on the left.  ROS (Neurologic): Negative except as noted above  IMAGING: MRI L spine 06/21/2023 Disc levels:   T12-L1: No significant disc protrusion, foraminal stenosis, or canal stenosis.   L1-L2: No significant disc protrusion, foraminal stenosis, or canal stenosis.   L2-L3: No significant disc protrusion, foraminal stenosis, or canal stenosis.   L3-L4: Slight disc bulging.  No significant stenosis.   L4-L5: Mild right eccentric disc bulge.  No significant stenosis.   L5-S1: Lobulated right eccentric disc bulging which includes a right paracentral disc protrusion. Interval right laminectomy and possible micro discectomy. Right subarticular recess stenosis and central canal stenosis is improved, now mild to moderate. Mild right foraminal stenosis.   S1-S2: Transitional anatomy.  No significant stenosis.   IMPRESSION: 1. At L5-S1, interval surgery with improved right subarticular recess stenosis and  central canal stenosis, now mild to moderate. Mild right foraminal stenosis. 2. Transitional lumbosacral anatomy, as detailed above.     Electronically Signed   By: Feliberto Harts M.D.   On: 06/21/2023 19:12  I discussed the imaging findings with one of the neuroradiologist.  I feel that she has some left-sided lateral recess stenosis at L5-S1 that may be secondary to some changes in the left lateral recess compared to the prior MRI scan.  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  I will start her on physical therapy.  I will see her after 6 weeks.  Will discuss left-sided microdiscectomy if she does not improve.    I spent a total of 10 minutes in this patient's care today. This time was spent reviewing pertinent records including imaging studies, obtaining and confirming history, performing a directed evaluation, formulating and discussing my recommendations, and documenting the visit within the medical record.    Venetia Night MD, [facility] Department of Neurosurgery

## 2023-06-22 NOTE — Telephone Encounter (Signed)
Patient added to Dr. Lucienne Capers schedule for today at 2:30pm.

## 2023-06-22 NOTE — Telephone Encounter (Signed)
-----   Message from Nurse Melburn Popper sent at 06/21/2023  7:29 PM EST ----- FYI: See telephone call from 2/10 and 2/7

## 2023-06-25 ENCOUNTER — Telehealth: Payer: Self-pay | Admitting: Neurosurgery

## 2023-06-25 NOTE — Telephone Encounter (Signed)
Patient saw Dr.Yarbrough on Tuesday and he told her that if she was having increased weakness to call the office. She is calling that she feels she is getting weaker on her left leg. She is still able to walk but she does not have the strength to push. What should she do? She has not been contacted by PT yet.

## 2023-06-25 NOTE — Telephone Encounter (Signed)
Per discussion with Nehemiah Settle, I offered her an appointment for 11am today. She declined an appointment for today due to concerns of missing work. I advised that if she is having worsening/new weakness and she is unable to come in to the office for an exam, we recommend the ER. She requested to come in for an appointment next week. I have scheduled her with Stacy on Monday. I confirmed that she has the number to physical therapy and encouraged her to call them to schedule an appointment ASAP.

## 2023-06-25 NOTE — H&P (View-Only) (Signed)
 DOS: 03/17/2023 (R L5/S1 microdiscectomy)  HISTORY OF PRESENT ILLNESS: She saw Dr. Myer Haff on 06/22/23 with severe left leg pain. She had full strength on exam.   Updated MRI showed left sided lateral recess stenosis at L5-S1.   He recommended PT and possible surgery if no improvement.   She called on Friday with increased numbness in left leg. Appointment was offered for Friday and she declined.   She is here for further evaluation.   She continues with constant left sided LBP/buttock pain with left posterior/lateral leg pain to her foot. She thinks she has lost a lot of strength in her left leg. Left leg is giving way when she walks. Pain is worse with getting up after prolonged sitting. She has pain with walking. She has more constant numbness in left leg. No symptoms in right leg.   She is taking neurontin. She has ultram but has not started it. Her son has been sick and she didn't want to take any medications at night.   No bowel or bladder issues.   PHYSICAL EXAMINATION:   There were no vitals filed for this visit.  Awake, alert, oriented to person, place, and time.  Speech is clear and fluent. Fund of knowledge is appropriate.   Cranial Nerves: Pupils equal round and reactive to light.  Facial tone is symmetric.    Well healed lumbar incision. Mild left sided lower lumbar tenderness.   No abnormal lesions on exposed skin.   Strength:  Side Iliopsoas Quads Hamstring PF DF EHL  R 5 5 5 5 5 5   L 5 4- 5 4- 4- 4-   Reflexes are 2+ and symmetric at the patella and achilles.    Clonus is not present.   Bilateral upper and lower extremity sensation is intact to light touch, but diminished in entire left leg.   She has slow gait and favors right side.   She can toe stand on right leg independently, but cannot toe stand on left leg independently. She can heel stand on right, but not on left.    ROS (Neurologic): Negative except as noted above  IMAGING: No interval  imaging to review   ASSESSMENT/PLAN:  Gus Rankin did great after her surgery and her preop right sided back and leg pain is gone.   She continues with constant left sided LBP/buttock pain with left posterior/lateral leg pain to her foot. No right leg pain.   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   Treatment options reviewed with patient and following plan made:   - Continue neurontin and prn ultram. Reviewed dosing and side effects.  - She would like to discuss surgery options with Dr. Myer Haff.  - Will discuss with him and message her with his further recommendations.  - Note given keeping her out of work for 2 weeks. Can change if needed.  - Red flag symptoms reviewed- will go to ED if these develop.  Advised to contact the office if any questions or concerns arise.  Patient reviewed with Dr. Myer Haff after her visit. He wants to see her in clinic next week to discuss surgery options. Message sent to patient. Staff to contact her with appointment.   I spent a total of 20 minutes in face-to-face and non-face-to-face activities related to this patient's care today including review of outside records, review of imaging, review of symptoms, physical exam, discussion of differential diagnosis, discussion of treatment options, and documentation.   Drake Leach PA-C Department  of Neurosurgery

## 2023-06-25 NOTE — Progress Notes (Signed)
DOS: 03/17/2023 (R L5/S1 microdiscectomy)  HISTORY OF PRESENT ILLNESS: She saw Dr. Myer Haff on 06/22/23 with severe left leg pain. She had full strength on exam.   Updated MRI showed left sided lateral recess stenosis at L5-S1.   He recommended PT and possible surgery if no improvement.   She called on Friday with increased numbness in left leg. Appointment was offered for Friday and she declined.   She is here for further evaluation.   She continues with constant left sided LBP/buttock pain with left posterior/lateral leg pain to her foot. She thinks she has lost a lot of strength in her left leg. Left leg is giving way when she walks. Pain is worse with getting up after prolonged sitting. She has pain with walking. She has more constant numbness in left leg. No symptoms in right leg.   She is taking neurontin. She has ultram but has not started it. Her son has been sick and she didn't want to take any medications at night.   No bowel or bladder issues.   PHYSICAL EXAMINATION:   There were no vitals filed for this visit.  Awake, alert, oriented to person, place, and time.  Speech is clear and fluent. Fund of knowledge is appropriate.   Cranial Nerves: Pupils equal round and reactive to light.  Facial tone is symmetric.    Well healed lumbar incision. Mild left sided lower lumbar tenderness.   No abnormal lesions on exposed skin.   Strength:  Side Iliopsoas Quads Hamstring PF DF EHL  R 5 5 5 5 5 5   L 5 4- 5 4- 4- 4-   Reflexes are 2+ and symmetric at the patella and achilles.    Clonus is not present.   Bilateral upper and lower extremity sensation is intact to light touch, but diminished in entire left leg.   She has slow gait and favors right side.   She can toe stand on right leg independently, but cannot toe stand on left leg independently. She can heel stand on right, but not on left.    ROS (Neurologic): Negative except as noted above  IMAGING: No interval  imaging to review   ASSESSMENT/PLAN:  Gus Rankin did great after her surgery and her preop right sided back and leg pain is gone.   She continues with constant left sided LBP/buttock pain with left posterior/lateral leg pain to her foot. No right leg pain.   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   Treatment options reviewed with patient and following plan made:   - Continue neurontin and prn ultram. Reviewed dosing and side effects.  - She would like to discuss surgery options with Dr. Myer Haff.  - Will discuss with him and message her with his further recommendations.  - Note given keeping her out of work for 2 weeks. Can change if needed.  - Red flag symptoms reviewed- will go to ED if these develop.  Advised to contact the office if any questions or concerns arise.  Patient reviewed with Dr. Myer Haff after her visit. He wants to see her in clinic next week to discuss surgery options. Message sent to patient. Staff to contact her with appointment.   I spent a total of 20 minutes in face-to-face and non-face-to-face activities related to this patient's care today including review of outside records, review of imaging, review of symptoms, physical exam, discussion of differential diagnosis, discussion of treatment options, and documentation.   Drake Leach PA-C Department  of Neurosurgery

## 2023-06-28 ENCOUNTER — Ambulatory Visit (INDEPENDENT_AMBULATORY_CARE_PROVIDER_SITE_OTHER): Payer: 59 | Admitting: Orthopedic Surgery

## 2023-06-28 ENCOUNTER — Encounter: Payer: Self-pay | Admitting: Orthopedic Surgery

## 2023-06-28 VITALS — BP 130/86 | Ht 60.0 in | Wt 183.6 lb

## 2023-06-28 DIAGNOSIS — M48061 Spinal stenosis, lumbar region without neurogenic claudication: Secondary | ICD-10-CM | POA: Diagnosis not present

## 2023-06-28 DIAGNOSIS — Z9889 Other specified postprocedural states: Secondary | ICD-10-CM

## 2023-06-28 DIAGNOSIS — M5416 Radiculopathy, lumbar region: Secondary | ICD-10-CM | POA: Diagnosis not present

## 2023-06-28 NOTE — Telephone Encounter (Signed)
I sent her a message earlier. See her chart.

## 2023-07-01 ENCOUNTER — Ambulatory Visit (INDEPENDENT_AMBULATORY_CARE_PROVIDER_SITE_OTHER): Payer: 59 | Admitting: Neurosurgery

## 2023-07-01 ENCOUNTER — Other Ambulatory Visit: Payer: Self-pay

## 2023-07-01 VITALS — BP 128/82 | Ht 60.0 in | Wt 183.0 lb

## 2023-07-01 DIAGNOSIS — M5417 Radiculopathy, lumbosacral region: Secondary | ICD-10-CM | POA: Diagnosis not present

## 2023-07-01 DIAGNOSIS — Z01818 Encounter for other preprocedural examination: Secondary | ICD-10-CM

## 2023-07-01 DIAGNOSIS — M5416 Radiculopathy, lumbar region: Secondary | ICD-10-CM

## 2023-07-01 DIAGNOSIS — M4807 Spinal stenosis, lumbosacral region: Secondary | ICD-10-CM | POA: Diagnosis not present

## 2023-07-01 NOTE — Progress Notes (Signed)
Referring Physician:  No referring provider defined for this encounter.  Primary Physician:  Lorre Munroe, NP  History of Present Illness: 07/01/2023 Tamara Mcclain presents today with severe left leg pain.  She underwent right microdiscectomy in November.  She did very well from that, but developed left-sided leg pain in the similar distribution with weakness distally.  She has had worsening weakness over the past couple weeks prompting reevaluation.   03/01/2023 Tamara Mcclain is here today with a chief complaint of chronic lower back and hip pain that has been ongoing for approximately three years. The pain is primarily located in the lower back and buttock region, radiating down to the hip. The patient has severe pain in the buttock and extending down the leg, and numbness below the knee on the posterior lateral aspect and on the lateral aspect of the foot extending towards the toes.  The patient has noticed significant weakness in the affected leg. The pain is exacerbated by walking and standing in one position, particularly when arching the back. The patient reports being able to walk a short distance, approximately the length of a parking lot, before needing to stop due to leg pain.  The patient has attempted physical therapy for a month, but reports that it exacerbated the pain. The patient has been managing the pain at home, but it has been progressively worsening, particularly after childbirth 6 months ago.   Bowel/Bladder Dysfunction: none  Conservative measures: TENS unit, seen a chiropractor Physical therapy: has participated in at [facility] without relief Multimodal medical therapy including regular antiinflammatories: ibuprofen Injections: has not received epidural steroid injections, she is allergic to injections  Past Surgery: no previous spinal surgeries   Tamara Mcclain has no symptoms of cervical myelopathy.  The symptoms are causing a significant impact  on the patient's life.   I have utilized the care everywhere function in epic to review the outside records available from external health systems.  Review of Systems:  A 10 point review of systems is negative, except for the pertinent positives and negatives detailed in the HPI.  Past Medical History: Past Medical History:  Diagnosis Date   Anxiety    Asthma    mild; flares up only when sick   Epiploic appendagitis    Lumbosacral disc herniation    L5-S1   MTHFR mutation    Primary hypertension    Von Willebrand disease (HCC) Dr. Janee Morn at [facility]   diagnosed when teenager due to large menstrual blood clots; after seeing hematology 2024, levels did not indicate the disease    Past Surgical History: Past Surgical History:  Procedure Laterality Date   ESOPHAGOGASTRODUODENOSCOPY (EGD) WITH PROPOFOL N/A 01/30/2020   Procedure: ESOPHAGOGASTRODUODENOSCOPY (EGD) WITH PROPOFOL;  Surgeon: Wyline Mood, MD;  Location: [facility] ENDOSCOPY;  Service: Gastroenterology;  Laterality: N/A;   LUMBAR LAMINECTOMY/DECOMPRESSION MICRODISCECTOMY Right 03/17/2023   Procedure: RIGHT L5-S1 MICRODISCECTOMY;  Surgeon: Venetia Night, MD;  Location: [facility] ORS;  Service: Neurosurgery;  Laterality: Right;   WISDOM TOOTH EXTRACTION  08/2018   no bleeding complications    Allergies: Allergies as of 07/01/2023 - Review Complete 07/01/2023  Allergen Reaction Noted   Tylenol [acetaminophen] Other (See Comments) 08/18/2016   Depo-medrol [methylprednisolone] Nausea Only 10/24/2019    Medications:  Current Outpatient Medications:    albuterol (VENTOLIN HFA) 108 (90 Base) MCG/ACT inhaler, Inhale 1-2 puffs into the lungs every 6 (six) hours as needed for wheezing or shortness of breath., Disp: 8 g, Rfl: 2  gabapentin (NEURONTIN) 300 MG capsule, Take 1 capsule (300 mg total) by mouth 3 (three) times daily., Disp: 90 capsule, Rfl: 0   ibuprofen (ADVIL) 200 MG tablet, Take 400 mg by mouth every 8 (eight) hours as  needed (pain.)., Disp: , Rfl:    levonorgestrel (MIRENA) 20 MCG/DAY IUD, 1 each by Intrauterine route once., Disp: , Rfl:   Social History: Social History   Tobacco Use   Smoking status: Never    Passive exposure: Past   Smokeless tobacco: Never  Vaping Use   Vaping status: Never Used  Substance Use Topics   Alcohol use: No    Alcohol/week: 0.0 standard drinks of alcohol   Drug use: No    Family Medical History: Family History  Problem Relation Age of Onset   Hypertension Father    Pulmonary embolism Father    Cancer Maternal Grandmother 40       breast   Arthritis Maternal Grandmother    Hyperlipidemia Maternal Grandmother    CAD Maternal Grandfather    Cancer Paternal Grandmother        melanoma, sinus cancer   Colon polyps Paternal Grandmother    Heart disease Paternal Grandfather    Gaucher's disease Half-Brother    Esophageal cancer Neg Hx    Stomach cancer Neg Hx    Liver disease Neg Hx     Physical Examination: Vitals:   07/01/23 1343  BP: 128/82    General: Patient is in no apparent distress. Attention to examination is appropriate.  Neck:   Supple.  Full range of motion.  Respiratory: Patient is breathing without any difficulty.   NEUROLOGICAL:     Awake, alert, oriented to person, place, and time.  Speech is clear and fluent.   Cranial Nerves: Pupils equal round and reactive to light.  Facial tone is symmetric.  Facial sensation is symmetric. Shoulder shrug is symmetric. Tongue protrusion is midline.  There is no pronator drift.  Strength: Side Biceps Triceps Deltoid Interossei Grip Wrist Ext. Wrist Flex.  R 5 5 5 5 5 5 5   L 5 5 5 5 5 5 5    Side Iliopsoas Quads Hamstring PF DF EHL  R 5 5 5 5 5 5   L 5 5 5 3 5 5    Reflexes are 1+ and symmetric at the biceps, triceps, brachioradialis, patella and achilles.   Hoffman's is absent.   Bilateral upper and lower extremity sensation is intact to light touch with exception of right S1 distribution  which shows diminished light touch sensation.    No evidence of dysmetria noted.  Gait is antalgic.  Straight leg raise is positive at 30 degrees on the left.     Medical Decision Making  Imaging: MRI lumbar spine 02/17/2023 IMPRESSION: 1. Lumbar spondylosis and degenerative disc disease, causing prominent impingement at L5-S1; mild impingement at L4-5; and borderline impingement at L3-4. 2. Subtle levoconvex lower lumbar scoliosis.     Electronically Signed   By: Gaylyn Rong M.D.   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   Findings conveyed to Dr. Myer Haff via [facility] secure chat.     Electronically Signed   By: Feliberto Harts M.D.   On: 06/23/2023 10:34   I have personally reviewed the images and agree with the above interpretation.  Assessment and Plan: Ms. Urias is a pleasant 23 y.o.  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  She has significant objective weakness.  She is doing well on the right side.  Her symptoms have rapidly worsened over the past 2 weeks.  She now has objective weakness.  At this point, no further conservative management is indicated due to the severity of her weakness.  I recommended surgical intervention with left-sided L5-S1 laminoforaminotomy.    I discussed the planned procedure at length with the patient, including the risks, benefits, alternatives, and indications. The risks discussed include but are not limited to bleeding, infection, need for reoperation, spinal fluid leak, stroke, vision loss, anesthetic complication, coma, paralysis, and even death. I also described in detail that improvement was not guaranteed.  The patient expressed understanding of these risks, and asked that we proceed with surgery. I described the surgery in layman's terms, and gave  ample opportunity for questions, which were answered to the best of my ability.  I spent a total of 30 minutes in this patient's care today. This time was spent reviewing pertinent records including imaging studies, obtaining and confirming history, performing a directed evaluation, formulating and discussing my recommendations, and documenting the visit within the medical record.   Thank you for involving me in the care of this patient.      Cianna Kasparian K. Myer Haff MD, [facility] Neurosurgery

## 2023-07-01 NOTE — Patient Instructions (Addendum)
Please see below for information in regards to your upcoming surgery:   Planned surgery:  Left L5-S1 Laminoforaminotomy   Surgery date: 07/12/2023 at [facility] (Medical Mall: [address], [place], [place] [address]) - you will find out your arrival time the business day before your surgery.   Pre-op appointment at [facility] Pre-admit Testing: we will call you with a date/time for this. If you are scheduled for an in person appointment, Pre-admit Testing is located on the first floor of the [facility] building, [address], [address]. Please bring all prescriptions in the original prescription bottles to your appointment. During this appointment, they will advise you which medications you can take the morning of surgery, and which medications you will need to hold for surgery. Labs (such as blood work, EKG) may be done at your pre-op appointment. You are not required to fast for these labs. Should you need to change your pre-op appointment, please call Pre-admit testing at [phone].   Common restrictions after surgery: No bending, lifting, or twisting ("BLT"). Avoid lifting objects heavier than 10 pounds for the first 6 weeks after surgery. Where possible, avoid household activities that involve lifting, bending, reaching, pushing, or pulling such as laundry, vacuuming, grocery shopping, and childcare. Try to arrange for help from friends and family for these activities while you heal. Do not drive while taking prescription pain medication. Weeks 6 through 12 after surgery: avoid lifting more than 25 pounds.   How to contact us:  If you have any questions/concerns before or after surgery, you can reach [place] at [phone], or you can send a mychart message. We can be reached by phone or mychart 8am-4pm, Monday-Friday.  *Please note: Calls after 4pm are forwarded to a third party answering service. Mychart messages are not routinely monitored during  evenings, weekends, and holidays. Please call our office to contact the answering service for urgent concerns during non-business hours.   If you have FMLA/disability paperwork, please drop it off or fax it to [phone], attention Patty.   Appointments/FMLA & disability paperwork: Joycelyn Rua, & Flonnie Hailstone Registered Nurse/Surgery scheduler: Royston Cowper Medical Assistants: Nash Mantis Physician Assistants: Joan Flores, PA-C, Manning Charity, PA-C & Drake Leach, PA-C Surgeons: Venetia Night, MD & Ernestine Mcmurray, MD

## 2023-07-06 ENCOUNTER — Other Ambulatory Visit: Payer: Self-pay

## 2023-07-06 ENCOUNTER — Ambulatory Visit: Payer: 59 | Admitting: Neurosurgery

## 2023-07-06 ENCOUNTER — Encounter
Admission: RE | Admit: 2023-07-06 | Discharge: 2023-07-06 | Disposition: A | Discharge status: Home / Self Care | Payer: 59 | Source: Ambulatory Visit | Attending: Neurosurgery | Admitting: Neurosurgery

## 2023-07-06 DIAGNOSIS — D6800 Von Willebrand disease, unspecified: Secondary | ICD-10-CM | POA: Insufficient documentation

## 2023-07-06 DIAGNOSIS — Z01812 Encounter for preprocedural laboratory examination: Secondary | ICD-10-CM | POA: Insufficient documentation

## 2023-07-06 LAB — BASIC METABOLIC PANEL
Anion gap: 6 (ref 5–15)
BUN: 8 mg/dL (ref 6–20)
CO2: 25 mmol/L (ref 22–32)
Calcium: 9.2 mg/dL (ref 8.9–10.3)
Chloride: 105 mmol/L (ref 98–111)
Creatinine, Ser: 0.74 mg/dL (ref 0.44–1.00)
GFR, Estimated: >60 mL/min (ref >60)
Glucose, Bld: 88 mg/dL (ref 70–99)
Potassium: 4.5 mmol/L (ref 3.5–5.1)
Sodium: 136 mmol/L (ref 135–145)

## 2023-07-06 LAB — CBC
HCT: 43.1 % (ref 36.0–46.0)
Hemoglobin: 14 g/dL (ref 12.0–15.0)
MCH: 26.1 pg (ref 26.0–34.0)
MCHC: 32.5 g/dL (ref 30.0–36.0)
MCV: 80.3 fL (ref 80.0–100.0)
Platelets: 319 K/uL (ref 150–400)
RBC: 5.37 MIL/uL — ABNORMAL HIGH (ref 3.87–5.11)
RDW: 13.7 % (ref 11.5–15.5)
WBC: 7.4 K/uL (ref 4.0–10.5)
nRBC: 0 % (ref 0.0–0.2)

## 2023-07-06 LAB — SURGICAL PCR SCREEN
MRSA, PCR: NEGATIVE
Staphylococcus aureus: POSITIVE — AB

## 2023-07-06 LAB — TYPE AND SCREEN
ABO/RH(D): A POS
Antibody Screen: NEGATIVE

## 2023-07-06 NOTE — Patient Instructions (Addendum)
 Your procedure is scheduled on: Monday, March 3 Report to the Registration Desk on the 1st floor of the [facility] To find out your arrival time, please call [phone] between 1PM - 3PM on: Friday, February 28 If your arrival time is 6:00 am, do not arrive before that time as the Medical Mall entrance doors do not open until 6:00 am.  REMEMBER: Instructions that are not followed completely may result in serious medical risk, up to and including death; or upon the discretion of your surgeon and anesthesiologist your surgery may need to be rescheduled.  Do not eat food after midnight the night before surgery.  No gum chewing or hard candies.  You may however, drink CLEAR liquids up to 2 hours before you are scheduled to arrive for your surgery. Do not drink anything within 2 hours of your scheduled arrival time.  Clear liquids include: - water  - apple juice without pulp - gatorade (not RED colors) - black coffee or tea (Do NOT add milk or creamers to the coffee or tea) Do NOT drink anything that is not on this list.  One week prior to surgery: starting February 24 Stop ANY OVER THE COUNTER supplements until after surgery.  You may however, continue to take Tylenol if needed for pain up until the day of surgery.  Continue taking all of your other prescription medications up until the day of surgery.  ON THE DAY OF SURGERY ONLY TAKE THESE MEDICATIONS WITH SIPS OF WATER:  Tramadol if needed for pain  Use albuterol inhaler on the day of surgery and bring to the hospital.  No Alcohol for 24 hours before or after surgery.  No Smoking including e-cigarettes for 24 hours before surgery.  No chewable tobacco products for at least 6 hours before surgery.  No nicotine patches on the day of surgery.  Do not use any "recreational" drugs for at least a week (preferably 2 weeks) before your surgery.  Please be advised that the combination of cocaine and anesthesia may have negative  outcomes, up to and including death. If you test positive for cocaine, your surgery will be cancelled.  On the morning of surgery brush your teeth with toothpaste and water, you may rinse your mouth with mouthwash if you wish. Do not swallow any toothpaste or mouthwash.  Use CHG Soap as directed on instruction sheet.  Do not wear jewelry, make-up, hairpins, clips or nail polish.  For welded (permanent) jewelry: bracelets, anklets, waist bands, etc.  Please have this removed prior to surgery.  If it is not removed, there is a chance that hospital personnel will need to cut it off on the day of surgery.  Do not wear lotions, powders, or perfumes.   Do not shave body hair from the neck down 48 hours before surgery.  Contact lenses, hearing aids and dentures may not be worn into surgery.  Do not bring valuables to the hospital. [facility] is not responsible for any missing/lost belongings or valuables.   Notify your doctor if there is any change in your medical condition (cold, fever, infection).  Wear comfortable clothing (specific to your surgery type) to the hospital.  After surgery, you can help prevent lung complications by doing breathing exercises.  Take deep breaths and cough every 1-2 hours.   If you are being discharged the day of surgery, you will not be allowed to drive home. You will need a responsible individual to drive you home and stay with you for  24 hours after surgery.   If you are taking public transportation, you will need to have a responsible individual with you.  Please call the Pre-admissions Testing Dept. at [phone] if you have any questions about these instructions.  Surgery Visitation Policy:  Patients having surgery or a procedure may have two visitors.  Children under the age of 84 must have an adult with them who is not the patient.  Temporary Visitor Restrictions Due to increasing cases of flu, RSV and COVID-19: Children ages 69 and under  will not be able to visit patients in [facility] hospitals under most circumstances.  Inpatient Visitation:    Visiting hours are 7 a.m. to 8 p.m. Up to four visitors are allowed at one time in a patient room. The visitors may rotate out with other people during the day.  One visitor age 47 or older may stay with the patient overnight and must be in the room by 8 p.m.    Pre-operative 5 CHG Bath Instructions   You can play a key role in reducing the risk of infection after surgery. Your skin needs to be as free of germs as possible. You can reduce the number of germs on your skin by washing with CHG (chlorhexidine gluconate) soap before surgery. CHG is an antiseptic soap that kills germs and continues to kill germs even after washing.   DO NOT use if you have an allergy to chlorhexidine/CHG or antibacterial soaps. If your skin becomes reddened or irritated, stop using the CHG and notify one of our RNs at [phone].   Please shower with the CHG soap starting 4 days before surgery using the following schedule:     Please keep in mind the following:  DO NOT shave, including legs and underarms, starting the day of your first shower.   You may shave your face at any point before/day of surgery.  Place clean sheets on your bed the day you start using CHG soap. Use a clean washcloth (not used since being washed) for each shower. DO NOT sleep with pets once you start using the CHG.   CHG Shower Instructions:  If you choose to wash your hair and private area, wash first with your normal shampoo/soap.  After you use shampoo/soap, rinse your hair and body thoroughly to remove shampoo/soap residue.  Turn the water OFF and apply about 3 tablespoons (45 ml) of CHG soap to a CLEAN washcloth.  Apply CHG soap ONLY FROM YOUR NECK DOWN TO YOUR TOES (washing for 3-5 minutes)  DO NOT use CHG soap on face, private areas, open wounds, or sores.  Pay special attention to the area where your surgery is  being performed.  If you are having back surgery, having someone wash your back for you may be helpful. Wait 2 minutes after CHG soap is applied, then you may rinse off the CHG soap.  Pat dry with a clean towel  Put on clean clothes/pajamas   If you choose to wear lotion, please use ONLY the CHG-compatible lotions on the back of this paper.     Additional instructions for the day of surgery: DO NOT APPLY any lotions, deodorants, cologne, or perfumes.   Put on clean/comfortable clothes.  Brush your teeth.  Ask your nurse before applying any prescription medications to the skin.      CHG Compatible Lotions   Aveeno Moisturizing lotion  Cetaphil Moisturizing Cream  Cetaphil Moisturizing Lotion  Clairol Herbal Essence Moisturizing Lotion, Dry Skin  Clairol  Herbal Essence Moisturizing Lotion, Extra Dry Skin  Clairol Herbal Essence Moisturizing Lotion, Normal Skin  Curel Age Defying Therapeutic Moisturizing Lotion with Alpha Hydroxy  Curel Extreme Care Body Lotion  Curel Soothing Hands Moisturizing Hand Lotion  Curel Therapeutic Moisturizing Cream, Fragrance-Free  Curel Therapeutic Moisturizing Lotion, Fragrance-Free  Curel Therapeutic Moisturizing Lotion, Original Formula  Eucerin Daily Replenishing Lotion  Eucerin Dry Skin Therapy Plus Alpha Hydroxy Crme  Eucerin Dry Skin Therapy Plus Alpha Hydroxy Lotion  Eucerin Original Crme  Eucerin Original Lotion  Eucerin Plus Crme Eucerin Plus Lotion  Eucerin TriLipid Replenishing Lotion  Keri Anti-Bacterial Hand Lotion  Keri Deep Conditioning Original Lotion Dry Skin Formula Softly Scented  Keri Deep Conditioning Original Lotion, Fragrance Free Sensitive Skin Formula  Keri Lotion Fast Absorbing Fragrance Free Sensitive Skin Formula  Keri Lotion Fast Absorbing Softly Scented Dry Skin Formula  Keri Original Lotion  Keri Skin Renewal Lotion Keri Silky Smooth Lotion  Keri Silky Smooth Sensitive Skin Lotion  Nivea Body Creamy  Conditioning Oil  Nivea Body Extra Enriched Teacher, adult education Moisturizing Lotion Nivea Crme  Nivea Skin Firming Lotion  NutraDerm 30 Skin Lotion  NutraDerm Skin Lotion  NutraDerm Therapeutic Skin Cream  NutraDerm Therapeutic Skin Lotion  ProShield Protective Hand Cream  Provon moisturizing lotion

## 2023-07-08 ENCOUNTER — Other Ambulatory Visit: Payer: Self-pay | Admitting: Internal Medicine

## 2023-07-08 NOTE — Telephone Encounter (Unsigned)
 Copied from CRM [id number]. Topic: Clinical - Medication Refill >> Jul 08, 2023  9:55 AM Tamara Mcclain wrote: Most Recent Primary Care Visit:  Provider: Lorre Munroe  Department: [facility]  Visit Type: OFFICE VISIT  Date: 02/26/2023  Medication: albuterol (VENTOLIN HFA) 108 (90 Base) MCG/ACT inhaler  Has the patient contacted their pharmacy? No Lost inhaler, hoping to get a refill for upcoming surgery  Is this the correct pharmacy for this prescription? Yes If no, delete pharmacy and type the correct one.  This is the patient's preferred pharmacy:  [id number] [place], [place] - [address] [place] [address] Phone: [phone] Fax: [phone]   Has the prescription been filled recently? No  Is the patient out of the medication? Yes, lost hers  Has the patient been seen for an appointment in the last year OR does the patient have an upcoming appointment? Yes  Can we respond through MyChart? Yes  Agent: Please be advised that Rx refills may take up to 3 business days. We ask that you follow-up with your pharmacy.

## 2023-07-08 NOTE — Telephone Encounter (Signed)
 Copied from CRM [id number]. Topic: Clinical - Medication Refill >> Jul 08, 2023  9:55 AM Geroge Baseman wrote: Most Recent Primary Care Visit:  Provider: Lorre Munroe  Department: [facility]  Visit Type: OFFICE VISIT  Date: 02/26/2023  Medication: albuterol (VENTOLIN HFA) 108 (90 Base) MCG/ACT inhaler  Has the patient contacted their pharmacy? No Lost inhaler, hoping to get a refill for upcoming surgery  Is this the correct pharmacy for this prescription? Yes If no, delete pharmacy and type the correct one.  This is the patient's preferred pharmacy:  [id number] [place], [place] - [address] [place] [address] Phone: [phone] Fax: [phone]   Has the prescription been filled recently? No  Is the patient out of the medication? Yes, lost hers  Has the patient been seen for an appointment in the last year OR does the patient have an upcoming appointment? Yes  Can we respond through MyChart? Yes  Agent: Please be advised that Rx refills may take up to 3 business days. We ask that you follow-up with your pharmacy.

## 2023-07-09 ENCOUNTER — Other Ambulatory Visit: Payer: Self-pay | Admitting: Internal Medicine

## 2023-07-09 MED ORDER — ALBUTEROL SULFATE HFA 108 (90 BASE) MCG/ACT IN AERS
1.0000 | INHALATION_SPRAY | Freq: Four times a day (QID) | RESPIRATORY_TRACT | 2 refills | Status: DC | PRN
Start: 2023-07-09 — End: 2024-04-03

## 2023-07-09 NOTE — Telephone Encounter (Signed)
 Requested Prescriptions  Pending Prescriptions Disp Refills   albuterol (VENTOLIN HFA) 108 (90 Base) MCG/ACT inhaler 8 g 2    Sig: Inhale 1-2 puffs into the lungs every 6 (six) hours as needed for wheezing or shortness of breath.     Pulmonology:  Beta Agonists 2 Failed - 07/09/2023 11:22 AM      Failed - Last BP in normal range    BP Readings from Last 1 Encounters:  07/06/23 (!) 138/90         Passed - Last Heart Rate in normal range    Pulse Readings from Last 1 Encounters:  07/06/23 87         Passed - Valid encounter within last 12 months    Recent Outpatient Visits           4 months ago Non-recurrent acute serous otitis media of left ear   [place] [facility] [place], Salv[place], NP   4 months ago Primary hypertension   [place] [facility] [place], Salvadore [place], NP   5 months ago Primary hypertension   [place] [facility] [place], Salvadore [place], NP   5 months ago Primary hypertension   [place] [facility] [place], Salvadore [place], NP   8 months ago Primary hypertension    [facility] [place], Salvadore [place], [place]

## 2023-07-09 NOTE — Telephone Encounter (Signed)
 Duplicate request

## 2023-07-11 MED ORDER — CHLORHEXIDINE GLUCONATE 0.12 % MT SOLN
15.0000 mL | Freq: Once | OROMUCOSAL | Status: AC
Start: 2023-07-11 — End: 2023-07-12
  Administered 2023-07-12: 15 mL via OROMUCOSAL

## 2023-07-11 MED ORDER — LACTATED RINGERS IV SOLN: INTRAVENOUS | Status: DC

## 2023-07-11 MED ORDER — CEFAZOLIN IN SODIUM CHLORIDE 2-0.9 GM/100ML-% IV SOLN
2.0000 g | Freq: Once | INTRAVENOUS | Status: DC
  Filled 2023-07-11: qty 100

## 2023-07-11 MED ORDER — ORAL CARE MOUTH RINSE
15.0000 mL | Freq: Once | OROMUCOSAL | Status: AC
Start: 2023-07-11 — End: 2023-07-12

## 2023-07-12 ENCOUNTER — Ambulatory Visit: Payer: Self-pay | Admitting: Certified Registered Nurse Anesthetist

## 2023-07-12 ENCOUNTER — Encounter: Payer: Self-pay | Admitting: Neurosurgery

## 2023-07-12 ENCOUNTER — Encounter
Admission: RE | Disposition: A | Discharge status: Home / Self Care | Payer: Self-pay | Source: Home / Self Care | Attending: Neurosurgery

## 2023-07-12 ENCOUNTER — Other Ambulatory Visit: Payer: Self-pay

## 2023-07-12 ENCOUNTER — Telehealth: Payer: Self-pay

## 2023-07-12 ENCOUNTER — Ambulatory Visit
Admission: RE | Admit: 2023-07-12 | Discharge: 2023-07-12 | Disposition: A | Discharge status: Home / Self Care | Payer: 59 | Attending: Neurosurgery | Admitting: Neurosurgery

## 2023-07-12 ENCOUNTER — Ambulatory Visit: Payer: Self-pay | Admitting: Urgent Care

## 2023-07-12 ENCOUNTER — Ambulatory Visit

## 2023-07-12 DIAGNOSIS — J45909 Unspecified asthma, uncomplicated: Secondary | ICD-10-CM | POA: Insufficient documentation

## 2023-07-12 DIAGNOSIS — Z01818 Encounter for other preprocedural examination: Secondary | ICD-10-CM

## 2023-07-12 DIAGNOSIS — M5416 Radiculopathy, lumbar region: Secondary | ICD-10-CM | POA: Diagnosis not present

## 2023-07-12 DIAGNOSIS — M4807 Spinal stenosis, lumbosacral region: Secondary | ICD-10-CM | POA: Insufficient documentation

## 2023-07-12 DIAGNOSIS — I10 Essential (primary) hypertension: Secondary | ICD-10-CM | POA: Insufficient documentation

## 2023-07-12 DIAGNOSIS — Z01812 Encounter for preprocedural laboratory examination: Secondary | ICD-10-CM

## 2023-07-12 DIAGNOSIS — D6800 Von Willebrand disease, unspecified: Secondary | ICD-10-CM

## 2023-07-12 HISTORY — PX: FORAMINOTOMY 1 LEVEL: SHX5835

## 2023-07-12 LAB — POCT PREGNANCY, URINE: Preg Test, Ur: NEGATIVE

## 2023-07-12 SURGERY — FORAMINOTOMY 1 LEVEL
Anesthesia: General | Laterality: Left

## 2023-07-12 MED ORDER — BUPIVACAINE LIPOSOME 1.3 % IJ SUSP
INTRAMUSCULAR | Status: AC
  Filled 2023-07-12: qty 20

## 2023-07-12 MED ORDER — LIDOCAINE HCL (PF) 2 % IJ SOLN
INTRAMUSCULAR | Status: AC
  Filled 2023-07-12: qty 5

## 2023-07-12 MED ORDER — OXYCODONE HCL 5 MG PO TABS
ORAL_TABLET | ORAL | Status: AC
  Filled 2023-07-12: qty 1

## 2023-07-12 MED ORDER — MIDAZOLAM HCL 2 MG/2ML IJ SOLN
INTRAMUSCULAR | Status: AC
  Filled 2023-07-12: qty 2

## 2023-07-12 MED ORDER — ONDANSETRON HCL 4 MG/2ML IJ SOLN
INTRAMUSCULAR | Status: AC
  Filled 2023-07-12: qty 2

## 2023-07-12 MED ORDER — OXYCODONE HCL 5 MG PO TABS
5.0000 mg | ORAL_TABLET | Freq: Once | ORAL | Status: AC
  Administered 2023-07-12: 5 mg via ORAL

## 2023-07-12 MED ORDER — DEXAMETHASONE SODIUM PHOSPHATE 10 MG/ML IJ SOLN
INTRAMUSCULAR | Status: AC
  Filled 2023-07-12: qty 1

## 2023-07-12 MED ORDER — PROPOFOL 1000 MG/100ML IV EMUL
INTRAVENOUS | Status: AC
  Filled 2023-07-12: qty 100

## 2023-07-12 MED ORDER — PROPOFOL 10 MG/ML IV BOLUS
INTRAVENOUS | Status: AC
  Filled 2023-07-12: qty 20

## 2023-07-12 MED ORDER — SURGIFLO WITH THROMBIN (HEMOSTATIC MATRIX KIT) OPTIME
TOPICAL | Status: DC | PRN
  Administered 2023-07-12: 1 via TOPICAL

## 2023-07-12 MED ORDER — PROPOFOL 500 MG/50ML IV EMUL
INTRAVENOUS | Status: DC | PRN
  Administered 2023-07-12: 40 ug/kg/min via INTRAVENOUS

## 2023-07-12 MED ORDER — KETOROLAC TROMETHAMINE 30 MG/ML IJ SOLN
INTRAMUSCULAR | Status: AC
  Filled 2023-07-12: qty 1

## 2023-07-12 MED ORDER — CEFAZOLIN SODIUM-DEXTROSE 2-4 GM/100ML-% IV SOLN
INTRAVENOUS | Status: AC
Start: 2023-07-12 — End: ?
  Filled 2023-07-12: qty 100

## 2023-07-12 MED ORDER — BUPIVACAINE HCL (PF) 0.5 % IJ SOLN
INTRAMUSCULAR | Status: AC
  Filled 2023-07-12: qty 30

## 2023-07-12 MED ORDER — DROPERIDOL 2.5 MG/ML IJ SOLN: 0.6250 mg | Freq: Once | INTRAMUSCULAR | Status: DC | PRN

## 2023-07-12 MED ORDER — CEFAZOLIN SODIUM-DEXTROSE 2-4 GM/100ML-% IV SOLN
2.0000 g | INTRAVENOUS | Status: AC
  Administered 2023-07-12: 2 g via INTRAVENOUS

## 2023-07-12 MED ORDER — ALBUTEROL SULFATE HFA 108 (90 BASE) MCG/ACT IN AERS
INHALATION_SPRAY | RESPIRATORY_TRACT | Status: AC
  Filled 2023-07-12: qty 6.7

## 2023-07-12 MED ORDER — PROPOFOL 10 MG/ML IV BOLUS
INTRAVENOUS | Status: DC | PRN
  Administered 2023-07-12: 180 mg via INTRAVENOUS

## 2023-07-12 MED ORDER — FENTANYL CITRATE (PF) 100 MCG/2ML IJ SOLN
25.0000 ug | INTRAMUSCULAR | Status: DC | PRN
  Administered 2023-07-12 (×4): 25 ug via INTRAVENOUS

## 2023-07-12 MED ORDER — DEXMEDETOMIDINE HCL IN NACL 80 MCG/20ML IV SOLN
INTRAVENOUS | Status: DC | PRN
  Administered 2023-07-12 (×3): 8 ug via INTRAVENOUS

## 2023-07-12 MED ORDER — DEXAMETHASONE SODIUM PHOSPHATE 10 MG/ML IJ SOLN
INTRAMUSCULAR | Status: DC | PRN
  Administered 2023-07-12: 10 mg via INTRAVENOUS

## 2023-07-12 MED ORDER — SODIUM CHLORIDE (PF) 0.9 % IJ SOLN
INTRAMUSCULAR | Status: DC | PRN
  Administered 2023-07-12: 60 mL

## 2023-07-12 MED ORDER — BUPIVACAINE-EPINEPHRINE (PF) 0.5% -1:200000 IJ SOLN
INTRAMUSCULAR | Status: AC
  Filled 2023-07-12: qty 10

## 2023-07-12 MED ORDER — PHENYLEPHRINE 80 MCG/ML (10ML) SYRINGE FOR IV PUSH (FOR BLOOD PRESSURE SUPPORT)
PREFILLED_SYRINGE | INTRAVENOUS | Status: DC | PRN
  Administered 2023-07-12: 40 ug via INTRAVENOUS
  Administered 2023-07-12: 80 ug via INTRAVENOUS
  Administered 2023-07-12 (×3): 40 ug via INTRAVENOUS

## 2023-07-12 MED ORDER — MIDAZOLAM HCL 2 MG/2ML IJ SOLN
INTRAMUSCULAR | Status: DC | PRN
  Administered 2023-07-12: 2 mg via INTRAVENOUS

## 2023-07-12 MED ORDER — FENTANYL CITRATE (PF) 100 MCG/2ML IJ SOLN
INTRAMUSCULAR | Status: AC
  Filled 2023-07-12: qty 2

## 2023-07-12 MED ORDER — SENNA 8.6 MG PO TABS: 1.0000 | ORAL_TABLET | Freq: Two times a day (BID) | ORAL | 0 refills | Status: DC | PRN

## 2023-07-12 MED ORDER — ROCURONIUM BROMIDE 100 MG/10ML IV SOLN
INTRAVENOUS | Status: DC | PRN
  Administered 2023-07-12: 15 mg via INTRAVENOUS

## 2023-07-12 MED ORDER — ACETAMINOPHEN 10 MG/ML IV SOLN
INTRAVENOUS | Status: AC
  Filled 2023-07-12: qty 100

## 2023-07-12 MED ORDER — KETAMINE HCL 10 MG/ML IJ SOLN
INTRAMUSCULAR | Status: DC | PRN
Start: 2023-07-12 — End: 2023-07-12
  Administered 2023-07-12: 20 mg via INTRAVENOUS
  Administered 2023-07-12: 10 mg via INTRAVENOUS

## 2023-07-12 MED ORDER — SUCCINYLCHOLINE CHLORIDE 200 MG/10ML IV SOSY
PREFILLED_SYRINGE | INTRAVENOUS | Status: AC
  Filled 2023-07-12: qty 10

## 2023-07-12 MED ORDER — FENTANYL CITRATE (PF) 100 MCG/2ML IJ SOLN
INTRAMUSCULAR | Status: DC | PRN
Start: 2023-07-12 — End: 2023-07-12
  Administered 2023-07-12 (×2): 50 ug via INTRAVENOUS

## 2023-07-12 MED ORDER — KETOROLAC TROMETHAMINE 30 MG/ML IJ SOLN
INTRAMUSCULAR | Status: DC | PRN
  Administered 2023-07-12: 30 mg via INTRAVENOUS

## 2023-07-12 MED ORDER — ONDANSETRON HCL 4 MG/2ML IJ SOLN
INTRAMUSCULAR | Status: DC | PRN
  Administered 2023-07-12: 4 mg via INTRAVENOUS

## 2023-07-12 MED ORDER — ALBUTEROL SULFATE HFA 108 (90 BASE) MCG/ACT IN AERS
INHALATION_SPRAY | RESPIRATORY_TRACT | Status: DC | PRN
  Administered 2023-07-12: 6 via RESPIRATORY_TRACT

## 2023-07-12 MED ORDER — PHENYLEPHRINE 80 MCG/ML (10ML) SYRINGE FOR IV PUSH (FOR BLOOD PRESSURE SUPPORT)
PREFILLED_SYRINGE | INTRAVENOUS | Status: AC
  Filled 2023-07-12: qty 10

## 2023-07-12 MED ORDER — OXYCODONE HCL 5 MG PO TABS: 5.0000 mg | ORAL_TABLET | ORAL | 0 refills | Status: DC | PRN

## 2023-07-12 MED ORDER — METHOCARBAMOL 500 MG PO TABS: 500.0000 mg | ORAL_TABLET | Freq: Four times a day (QID) | ORAL | 0 refills | Status: DC | PRN

## 2023-07-12 MED ORDER — LIDOCAINE HCL (CARDIAC) PF 100 MG/5ML IV SOSY
PREFILLED_SYRINGE | INTRAVENOUS | Status: DC | PRN
  Administered 2023-07-12: 80 mg via INTRAVENOUS

## 2023-07-12 MED ORDER — SUCCINYLCHOLINE CHLORIDE 200 MG/10ML IV SOSY
PREFILLED_SYRINGE | INTRAVENOUS | Status: DC | PRN
  Administered 2023-07-12: 100 mg via INTRAVENOUS

## 2023-07-12 MED ORDER — CHLORHEXIDINE GLUCONATE 0.12 % MT SOLN
OROMUCOSAL | Status: AC
  Filled 2023-07-12: qty 15

## 2023-07-12 MED ORDER — SODIUM CHLORIDE (PF) 0.9 % IJ SOLN
INTRAMUSCULAR | Status: AC
  Filled 2023-07-12: qty 20

## 2023-07-12 MED ORDER — 0.9 % SODIUM CHLORIDE (POUR BTL) OPTIME
TOPICAL | Status: DC | PRN
  Administered 2023-07-12: 500 mL

## 2023-07-12 MED ORDER — BUPIVACAINE-EPINEPHRINE (PF) 0.5% -1:200000 IJ SOLN
INTRAMUSCULAR | Status: DC | PRN
  Administered 2023-07-12: 10 mL

## 2023-07-12 MED ORDER — KETAMINE HCL 50 MG/5ML IJ SOSY
PREFILLED_SYRINGE | INTRAMUSCULAR | Status: AC
  Filled 2023-07-12: qty 5

## 2023-07-12 SURGICAL SUPPLY — 32 items
BASIN KIT SINGLE STR (MISCELLANEOUS) ×1 IMPLANT
BUR NEURO DRILL SOFT 3.0X3.8M (BURR) ×1 IMPLANT
DERMABOND ADVANCED .7 DNX12 (GAUZE/BANDAGES/DRESSINGS) ×1 IMPLANT
DRAPE C ARM PK CFD 31 SPINE (DRAPES) ×1 IMPLANT
DRAPE LAPAROTOMY 100X77 ABD (DRAPES) ×1 IMPLANT
DRAPE MICROSCOPE SPINE 48X150 (DRAPES) ×1 IMPLANT
DRSG OPSITE POSTOP 3X4 (GAUZE/BANDAGES/DRESSINGS) ×1 IMPLANT
ELECT EZSTD 165MM 6.5IN (MISCELLANEOUS) ×1 IMPLANT
ELECT REM PT RETURN 9FT ADLT (ELECTROSURGICAL) ×1 IMPLANT
ELECTRODE EZSTD 165MM 6.5IN (MISCELLANEOUS) ×1 IMPLANT
ELECTRODE REM PT RTRN 9FT ADLT (ELECTROSURGICAL) ×1 IMPLANT
GLOVE BIOGEL PI IND STRL 6.5 (GLOVE) ×1 IMPLANT
GLOVE SURG SYN 6.5 ES PF (GLOVE) ×1 IMPLANT
GLOVE SURG SYN 6.5 PF PI (GLOVE) ×1 IMPLANT
GLOVE SURG SYN 8.5 E (GLOVE) ×3 IMPLANT
GLOVE SURG SYN 8.5 PF PI (GLOVE) ×3 IMPLANT
GOWN SRG LRG LVL 4 IMPRV REINF (GOWNS) ×1 IMPLANT
GOWN SRG XL LVL 3 NONREINFORCE (GOWNS) ×1 IMPLANT
KIT SPINAL PRONEVIEW (KITS) ×1 IMPLANT
MANIFOLD NEPTUNE II (INSTRUMENTS) ×1 IMPLANT
MARKER SKIN DUAL TIP RULER LAB (MISCELLANEOUS) ×1 IMPLANT
NDL SAFETY ECLIPSE 18X1.5 (NEEDLE) ×1 IMPLANT
NS IRRIG 500ML POUR BTL (IV SOLUTION) ×1 IMPLANT
PACK LAMINECTOMY ARMC (PACKS) ×1 IMPLANT
PAD ARMBOARD 7.5X6 YLW CONV (MISCELLANEOUS) ×1 IMPLANT
SURGIFLO W/THROMBIN 8M KIT (HEMOSTASIS) ×1 IMPLANT
SUT STRATA 3-0 15 PS-2 (SUTURE) ×1 IMPLANT
SUT VIC AB 0 CT1 27XCR 8 STRN (SUTURE) ×1 IMPLANT
SUT VIC AB 2-0 CT1 18 (SUTURE) ×1 IMPLANT
SYR 30ML LL (SYRINGE) ×2 IMPLANT
SYR 3ML LL SCALE MARK (SYRINGE) ×1 IMPLANT
TRAP FLUID SMOKE EVACUATOR (MISCELLANEOUS) ×1 IMPLANT

## 2023-07-12 NOTE — Op Note (Signed)
 Indications: [name] is suffering from M54.16 lumbar radiculopathy . The patient tried and failed conservative management, prompting surgical intervention.  Findings: lateral recess stenosis  Preoperative Diagnosis: M54.16 lumbar radiculopathy  Postoperative Diagnosis: same   EBL: 10 ml IVF:see anesthesia record Drains: none Disposition: Extubated and Stable to PACU Complications: none  No foley catheter was placed.   Preoperative Note:  Risks of surgery discussed include: infection, bleeding, stroke, coma, death, paralysis, CSF leak, nerve/spinal cord injury, numbness, tingling, weakness, complex regional pain syndrome, recurrent stenosis and/or disc herniation, vascular injury, development of instability, neck/back pain, need for further surgery, persistent symptoms, development of deformity, and the risks of anesthesia. The patient understood these risks and agreed to proceed.  Operative Note:   1. L L5/S1 laminoforaminotomy  The patient was then brought from the preoperative center with intravenous access established.  The patient underwent general anesthesia and endotracheal tube intubation, and was then rotated on the Holden rail top where all pressure points were appropriately padded.  The skin was then thoroughly cleansed.  Perioperative antibiotic prophylaxis was administered.  Sterile prep and drapes were then applied and a timeout was then observed.  C-arm was brought into the field under sterile conditions and under lateral visualization the L5-S1 interspace was identified and marked.  The incision was marked and injected with local anesthetic. Once this was complete a 4 cm incision was opened with the use of a #10 blade knife.    The metrx tubes were sequentially advanced and confirmed in position at L5-S1. An 18mm by 70mm tube was locked in place to the bed side attachment.  The microscope was then sterilely brought into the field and muscle creep was hemostased  with a bipolar and resected with a pituitary rongeur.  A Bovie extender was then used to expose the spinous process and lamina.  Careful attention was placed to not violate the facet capsule. A 3 mm matchstick drill bit was then used to make a hemi-laminotomy trough until the ligamentum flavum was exposed.  This was extended to the base of the spinous process and to the contralateral side to remove all the central bone from each side.  Once this was complete and the underlying ligamentum flavum was visualized, it was dissected with a curette and resected with Kerrison rongeurs.  Extensive ligamentum hypertrophy was noted, requiring a substantial amount of time and care for removal.  The dura was identified and palpated. The kerrison rongeur was then used to remove the medial facet bilaterally until no compression was noted.  A balltip probe was used to confirm decompression of the ipsilateral S1 nerve root.   No CSF leak was noted.   The wound was copiously irrigated. The tube system was then removed under microscopic visualization and hemostasis was obtained with a bipolar.    The fascial layer was reapproximated with the use of a 0 Vicryl suture.  Subcutaneous tissue layer was reapproximated using 2-0 Vicryl suture.  3-0 monocryl was placed in subcuticular fashion. The skin was then cleansed and Dermabond was used to close the skin opening.  Patient was then rotated back to the preoperative bed awakened from anesthesia and taken to recovery all counts are correct in this case.  I performed the entire procedure with the assistance of Manning Charity PA as an Designer, television/film set. An assistant was required for this procedure due to the complexity.  The assistant provided assistance in tissue manipulation and suction, and was required for the successful and safe performance of  the procedure. I performed the critical portions of the procedure.   Maegen Wigle K. Myer Haff MD

## 2023-07-12 NOTE — Progress Notes (Signed)
 Followed up with patient's spouse and he stated that they were able to obtain prescription for pain meds and patient was able to void without difficulty.

## 2023-07-12 NOTE — Discharge Instructions (Signed)
 Your surgeon has performed an operation on your lumbar spine (low back) to relieve pressure on one or more nerves. Many times, patients feel better immediately after surgery and can "overdo it." Even if you feel well, it is important that you follow these activity guidelines. If you do not let your back heal properly from the surgery, you can increase the chance of a disc herniation and/or return of your symptoms. The following are instructions to help in your recovery once you have been discharged from the hospital.  * It is ok to take NSAIDs after surgery.  Activity    No bending, lifting, or twisting ("BLT"). Avoid lifting objects heavier than 10 pounds (gallon milk jug).  Where possible, avoid household activities that involve lifting, bending, pushing, or pulling such as laundry, vacuuming, grocery shopping, and childcare. Try to arrange for help from friends and family for these activities while your back heals.  Increase physical activity slowly as tolerated.  Taking short walks is encouraged, but avoid strenuous exercise. Do not jog, run, bicycle, lift weights, or participate in any other exercises unless specifically allowed by your doctor. Avoid prolonged sitting, including car rides.  Talk to your doctor before resuming sexual activity.  You should not drive until cleared by your doctor.  Until released by your doctor, you should not return to work or school.  You should rest at home and let your body heal.   You may shower three days after your surgery.  After showering, lightly dab your incision dry. Do not take a tub bath or go swimming for 3 weeks, or until approved by your doctor at your follow-up appointment.  If you smoke, we strongly recommend that you quit.  Smoking has been proven to interfere with normal healing in your back and will dramatically reduce the success rate of your surgery. Please contact [facility] (800-QUIT-NOW) and use the resources at www.[facility].com for  assistance in stopping smoking.  Surgical Incision   If you have a dressing on your incision, you may remove it three days after your surgery. Keep your incision area clean and dry.  If you have staples or stitches on your incision, you should have a follow up scheduled for removal. If you do not have staples or stitches, you will have steri-strips (small pieces of surgical tape) or Dermabond glue. The steri-strips/glue should begin to peel away within about a week (it is fine if the steri-strips fall off before then). If the strips are still in place one week after your surgery, you may gently remove them.  Diet            You may return to your usual diet. Be sure to stay hydrated.  When to Contact us  Although your surgery and recovery will likely be uneventful, you may have some residual numbness, aches, and pains in your back and/or legs. This is normal and should improve in the next few weeks.  However, should you experience any of the following, contact us immediately: New numbness or weakness Pain that is progressively getting worse, and is not relieved by your pain medications or rest Bleeding, redness, swelling, pain, or drainage from surgical incision Chills or flu-like symptoms Fever greater than 101.0 F (38.3 C) Problems with bowel or bladder functions Difficulty breathing or shortness of breath Warmth, tenderness, or swelling in your calf  Contact Information How to contact us:  If you have any questions/concerns before or after surgery, you can reach [place] at [phone], or you can  send a [employer]. We can be reached by phone or mychart 8am-4pm, Monday-Friday.  *Please note: Calls after 4pm are forwarded to a third party answering service. Mychart messages are not routinely monitored during evenings, weekends, and holidays. Please call our office to contact the answering service for urgent concerns during non-business hours.

## 2023-07-12 NOTE — Anesthesia Preprocedure Evaluation (Signed)
 Anesthesia Evaluation  Patient identified by MRN, date of birth, ID band Patient awake    Reviewed: Allergy & Precautions, NPO status , Patient's Chart, lab work & pertinent test results  History of Anesthesia Complications Negative for: history of anesthetic complications  Airway Mallampati: III  TM Distance: >3 FB Neck ROM: full    Dental no notable dental hx. (+) Dental Advidsory Given   Pulmonary neg shortness of breath, asthma , neg recent URI   Pulmonary exam normal        Cardiovascular hypertension, (-) angina (-) Past MI and (-) Cardiac Stents negative cardio ROS Normal cardiovascular exam(-) dysrhythmias (-) Valvular Problems/Murmurs     Neuro/Psych  PSYCHIATRIC DISORDERS Anxiety     negative neurological ROS     GI/Hepatic negative GI ROS, Neg liver ROS,,,  Endo/Other  negative endocrine ROS    Renal/GU negative Renal ROS  negative genitourinary   Musculoskeletal   Abdominal   Peds  Hematology negative hematology ROS (+)   Anesthesia Other Findings Past Medical History: No date: Anxiety No date: Asthma     Comment:  mild; flares up only when sick 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 Jane[facility]: Von Willebrand disease [facility])     Comment:  diagnosed when teenager due to large menstrual blood               clots; after seeing hematology 2024, levels did not               indicate the disease  Past Surgical History: 01/30/2020: ESOPHAGOGASTRODUODENOSCOPY (EGD) WITH PROPOFOL; N/A     Comment:  Procedure: ESOPHAGOGASTRODUODENOSCOPY (EGD) WITH               PROPOFOL;  Surgeon: Wyline Mood, MD;  Location: [facility]               ENDOSCOPY;  Service: Gastroenterology;  Laterality: N/A; 08/2018: WISDOM TOOTH EXTRACTION     Comment:  no bleeding complications  BMI    Body Mass Index: 36.13 kg/m       Reproductive/Obstetrics negative OB ROS                             Anesthesia Physical Anesthesia Plan  ASA: 2  Anesthesia Plan: General   Post-op Pain Management: Toradol IV (intra-op)*, Ofirmev IV (intra-op)*, Ketamine IV* and Dilaudid IV   Induction: Intravenous  PONV Risk Score and Plan: 3 and Ondansetron, Dexamethasone, Midazolam and Treatment may vary due to age or medical condition  Airway Management Planned: Oral ETT  Additional Equipment:   Intra-op Plan:   Post-operative Plan: Extubation in OR  Informed Consent: I have reviewed the patients History and Physical, chart, labs and discussed the procedure including the risks, benefits and alternatives for the proposed anesthesia with the patient or authorized representative who has indicated his/her understanding and acceptance.     Dental Advisory Given  Plan Discussed with: Anesthesiologist, CRNA and Surgeon  Anesthesia Plan Comments: (Patient consented for risks of anesthesia including but not limited to:  - adverse reactions to medications - damage to eyes, teeth, lips or other oral mucosa - nerve damage due to positioning  - sore throat or hoarseness - Damage to heart, brain, nerves, lungs, other parts of body or loss of life  Patient voiced understanding and assent.)  Anesthesia Quick Evaluation

## 2023-07-12 NOTE — Interval H&P Note (Signed)
 History and Physical Interval Note:  07/12/2023 9:12 AM  Tamara Mcclain [place]  has presented today for surgery, with the diagnosis of M54.16 lumbar radiculopathy.  The various methods of treatment have been discussed with the patient and family. After consideration of risks, benefits and other options for treatment, the patient has consented to  Procedure(s): LEFT L5-S1 LAMINOFORAMINOTOMY (Left) as a surgical intervention.  The patient's history has been reviewed, patient examined, no change in status, stable for surgery.  I have reviewed the patient's chart and labs.  Questions were answered to the patient's satisfaction.    Heart sounds normal no MRG. Chest Clear to Auscultation Bilaterally.   Ky Moskowitz

## 2023-07-12 NOTE — Progress Notes (Signed)
 Pt was discharged home, before waiting to void. She ambulated and tolerated fluids before discharged home. This Rn reached out to the pt's spouse regarding that it is very important for her to void, and if she is unable to void with a full bladder to go the the ED. Pt's spouse verbalized fully understanding.

## 2023-07-12 NOTE — Discharge Summary (Signed)
 Discharge Summary  Patient ID: Tamara Mcclain MRN: [id number] DOB/AGE: July 14, 2000 22 y.o.  Admit date: 07/12/2023 Discharge date: 07/12/2023  Admission Diagnoses:  Discharge Diagnoses:  Active Problems:   * No active hospital problems. *   Discharged Condition: {condition:18240}  Hospital Course: ***  Consults: {consultation:18241}  Significant Diagnostic Studies: {diagnostics:18242}  Treatments: {Tx:18249}  Discharge Exam: Blood pressure (!) 146/96, pulse 88, temperature 98.5 F (36.9 C), temperature source Temporal, resp. rate 16, height 5' (1.524 m), weight 81.6 kg, SpO2 97%, not currently breastfeeding. {physical WUJW:1191478}  Disposition: Discharge disposition: 01-Home or Self Care        Allergies as of 07/12/2023       Reactions   Tylenol [acetaminophen] Other (See Comments)   fever   Depo-medrol [methylprednisolone] Nausea Only   Feeling flushed        Medication List     STOP taking these medications    traMADol 50 MG tablet Commonly known as: ULTRAM       TAKE these medications    albuterol 108 (90 Base) MCG/ACT inhaler Commonly known as: VENTOLIN HFA Inhale 1-2 puffs into the lungs every 6 (six) hours as needed for wheezing or shortness of breath.   gabapentin 300 MG capsule Commonly known as: Neurontin Take 1 capsule (300 mg total) by mouth 3 (three) times daily.   ibuprofen 200 MG tablet Commonly known as: ADVIL Take 400-600 mg by mouth every 8 (eight) hours as needed (pain.).   levonorgestrel 20 MCG/DAY Iud Commonly known as: MIRENA 1 each by Intrauterine route once.   methocarbamol 500 MG tablet Commonly known as: ROBAXIN Take 1 tablet (500 mg total) by mouth every 6 (six) hours as needed for muscle spasms.   oxyCODONE 5 MG immediate release tablet Commonly known as: Roxicodone Take 1 tablet (5 mg total) by mouth every 4 (four) hours as needed for severe pain (pain score 7-10).   senna 8.6 MG Tabs tablet Commonly known  as: SENOKOT Take 1 tablet (8.6 mg total) by mouth 2 (two) times daily as needed for mild constipation.        Follow-up Information     Susanne Borders, PA Follow up on 07/27/2023.   Specialty: Neurosurgery Contact information: [address] [place] [address] [phone]                 Signed: Susanne Borders 07/12/2023, 11:28 AM

## 2023-07-12 NOTE — Anesthesia Procedure Notes (Signed)
 Procedure Name: Intubation Date/Time: 07/12/2023 9:50 AM  Performed by: Pricilla Handler, RNPre-anesthesia Checklist: Patient identified, Patient being monitored, Timeout performed, Emergency Drugs available and Suction available Patient Re-evaluated:Patient Re-evaluated prior to induction Oxygen Delivery Method: Circle system utilized Preoxygenation: Pre-oxygenation with 100% oxygen Induction Type: IV induction Ventilation: Mask ventilation without difficulty Laryngoscope Size: Mac and 3 Grade View: Grade I Tube type: Oral Tube size: 7.0 mm Number of attempts: 1 Airway Equipment and Method: Stylet Placement Confirmation: ETT inserted through vocal cords under direct vision, positive ETCO2 and breath sounds checked- equal and bilateral Secured at: 21 cm Tube secured with: Tape Dental Injury: Teeth and Oropharynx as per pre-operative assessment

## 2023-07-12 NOTE — Transfer of Care (Signed)
 Immediate Anesthesia Transfer of Care Note  Patient: Tamara Mcclain  Procedure(s) Performed: LEFT L5-S1 LAMINOFORAMINOTOMY (Left)  Patient Location: PACU  Anesthesia Type:General  Level of Consciousness: drowsy  Airway & Oxygen Therapy: Patient Spontanous Breathing and Patient connected to face mask oxygen  Post-op Assessment: Report given to RN and Post -op Vital signs reviewed and stable  Post vital signs: Reviewed and stable  Last Vitals:  Vitals Value Taken Time  BP 126/80 07/12/23 1200  Temp 36.7 C 07/12/23 1158  Pulse 95 07/12/23 1201  Resp 17 07/12/23 1201  SpO2 100 % 07/12/23 1201  Vitals shown include unfiled device data.  Last Pain:  Vitals:   07/12/23 1158  TempSrc:   PainSc: Asleep         Complications: No notable events documented.

## 2023-07-12 NOTE — Telephone Encounter (Signed)
 Received authorization request for oxycodone from Cover[employer] for [employer].   This has been submitted (Key: [id number]) for [employer]. Per [place] at [phone] this is approved through 08/12/23.  For [employer], [employer] responded with "[employer] is not the processor for this request. For additional information, please contact customer service using the number on the back of the benefit card. CALL [phone]. I called and spoke with [facility] at above #. She stated that they do not handle this plan, only the [facility] plan and to check with [facility] for the # to call.  I spoke with [facility] and they were able to process the prescription through [facility] and said we do not need to do anything with the [facility] plan.

## 2023-07-14 ENCOUNTER — Encounter: Payer: Self-pay | Admitting: Neurosurgery

## 2023-07-14 NOTE — Anesthesia Postprocedure Evaluation (Signed)
 Anesthesia Post Note  Patient: Tamara Mcclain  Procedure(s) Performed: LEFT L5-S1 LAMINOFORAMINOTOMY (Left)  Patient location during evaluation: PACU Anesthesia Type: General Level of consciousness: awake and alert Pain management: pain level controlled Vital Signs Assessment: post-procedure vital signs reviewed and stable Respiratory status: spontaneous breathing, nonlabored ventilation, respiratory function stable and patient connected to nasal cannula oxygen Cardiovascular status: blood pressure returned to baseline and stable Postop Assessment: no apparent nausea or vomiting Anesthetic complications: no   No notable events documented.   Last Vitals:  Vitals:   07/12/23 1315 07/12/23 1333  BP: 121/75 132/80  Pulse: 94 65  Resp: 18 18  Temp: 36.9 C 36.8 C  SpO2: 98% 98%    Last Pain:  Vitals:   07/12/23 1333  TempSrc: Temporal  PainSc: 5                  Lenard Simmer

## 2023-07-27 ENCOUNTER — Encounter: Payer: Self-pay | Admitting: Neurosurgery

## 2023-07-27 ENCOUNTER — Ambulatory Visit (INDEPENDENT_AMBULATORY_CARE_PROVIDER_SITE_OTHER): Payer: 59 | Admitting: Neurosurgery

## 2023-07-27 VITALS — BP 132/98 | Temp 98.1°F | Ht 60.0 in | Wt 180.0 lb

## 2023-07-27 DIAGNOSIS — Z9889 Other specified postprocedural states: Secondary | ICD-10-CM

## 2023-07-27 DIAGNOSIS — M5416 Radiculopathy, lumbar region: Secondary | ICD-10-CM

## 2023-07-27 MED ORDER — SENNA 8.6 MG PO TABS: 1.0000 | ORAL_TABLET | Freq: Two times a day (BID) | ORAL | 0 refills | Status: DC | PRN

## 2023-07-27 MED ORDER — OXYCODONE HCL 5 MG PO TABS: 5.0000 mg | ORAL_TABLET | Freq: Three times a day (TID) | ORAL | 0 refills | Status: AC | PRN

## 2023-07-27 NOTE — Progress Notes (Signed)
   REFERRING PHYSICIAN:  Daryel Gerald [address] [place],  [place] [address]  DOS: 07/12/23 Left L5-S1 laminoforaminotomy   HISTORY OF PRESENT ILLNESS: Tamara Mcclain is approximately 2 weeks status post lumbar decompression. she is doing well despite some ongoing incisional pain, constipation, and some continued left leg pain. She continues to take Oxycodone but ran out over the weekend.  She is only taking Doculax for constipation.   PHYSICAL EXAMINATION:  General: Patient is well developed, well nourished, calm, collected, and in no apparent distress.   NEUROLOGICAL:  General: In no acute distress.   Awake, alert, oriented to person, place, and time.  Pupils equal round and reactive to light.  Facial tone is symmetric.  Tongue protrusion is midline.  There is no pronator drift.   Strength:            Side Iliopsoas Quads Hamstring PF DF EHL  R 5 5 5 5 5 5   L 5 5 5  4- 5 5   Incision c/d/I and healing well   ROS (Neurologic):  Negative except as noted above  IMAGING: No interval imaging to review  ASSESSMENT/PLAN:  Tamara Mcclain is doing well approximately 2 weeks after lumbar decompression. I suspect her incisional pain will continue to improve. I encouraged her to escalate her bowel regimen and have sent a prescription in for senna to [facility] it is cheaper through [facility].  I have sent a refill of her oxycodone per [facility].  I have also encouraged her to continue with Dulcolax and take MiraLAX as needed.  She is not currently taking Robaxin but I encouraged her to do so as well as over-the-counter anti-inflammatories as needed.  We briefly discussed restarting her gabapentin that she was prescribed preoperatively.  In discussing things further she did not actually start this medication.  I encouraged her to start it should the above changes not improve her symptoms.  We discussed activity escalation and I have advised the patient to lift up to 10  pounds until 6 weeks after surgery, then increase up to 25 pounds until 12 weeks after surgery.  After 12 weeks post-op, the patient advised to increase activity as tolerated. she will follow up with Dr. Myer Haff in 4 weeks or sooner should she have any questions or concerns.  Manning Charity PA-C Department of neurosurgery

## 2023-08-11 ENCOUNTER — Other Ambulatory Visit: Payer: Self-pay

## 2023-08-11 ENCOUNTER — Ambulatory Visit
Admission: EM | Admit: 2023-08-11 | Discharge: 2023-08-11 | Disposition: A | Discharge status: Home / Self Care | Attending: Emergency Medicine | Admitting: Emergency Medicine

## 2023-08-11 ENCOUNTER — Encounter: Payer: Self-pay | Admitting: Emergency Medicine

## 2023-08-11 DIAGNOSIS — S46811A Strain of other muscles, fascia and tendons at shoulder and upper arm level, right arm, initial encounter: Secondary | ICD-10-CM

## 2023-08-11 DIAGNOSIS — M25511 Pain in right shoulder: Secondary | ICD-10-CM

## 2023-08-11 MED ORDER — DICLOFENAC SODIUM 75 MG PO TBEC: 75.0000 mg | DELAYED_RELEASE_TABLET | Freq: Two times a day (BID) | ORAL | 0 refills | Status: DC

## 2023-08-11 NOTE — ED Triage Notes (Signed)
 Patient presents to [facility] for evaluation of right shoulder/neck pain starting last night.  Says she was sitting in a chair ordering dinner, denies known injury.  Had pain medicine and muscle relaxer from recent lower back pain, took both last night, no relief this morning.  Pain is constant 7/10 but had spasms that shoot up to 10/10.

## 2023-08-11 NOTE — ED Provider Notes (Signed)
 Tamara Mcclain    CSN: [id number] Arrival date & time: 08/11/23  1534      History   Chief Complaint Chief Complaint  Patient presents with   Shoulder Pain         HPI [name] is a 23 y.o. female.   Patient presents for evaluation of right sided neck and shoulder pain beginning 1 day ago without precipitating event, injury or trauma.  Endorses she was ordering her dinner when symptoms started abruptly.  Pain exacerbated by movement of the arm.  It radiates down the arm stopping at the elbow.  Denies numbness or tingling.  Has attempted use of oxycodone which she has for chronic back pain as well as muscle relaxant with minimal relief.  Past Medical History:  Diagnosis Date   Anxiety    Asthma    mild; flares up only when sick   Epiploic appendagitis    Lumbosacral disc herniation    L5-S1   MTHFR mutation    Primary hypertension    Von Willebrand disease (HCC) Dr. Janee Morn at [facility]   diagnosed when teenager due to large menstrual blood clots; after seeing hematology 2024, levels did not indicate the disease    Patient Active Problem List   Diagnosis Date Noted   Lumbar radiculopathy 03/17/2023   GAD (generalized anxiety disorder) 10/27/2022   Pure hypercholesterolemia 10/27/2022   Asthma 02/10/2022   High blood pressure 11/10/2021   Class 2 obesity due to excess calories with body mass index (BMI) of 36.0 to 36.9 in adult 11/10/2021   Panic attacks 12/05/2019   Epiploic appendagitis 06/28/2019   Von Willebrand disease (HCC) 08/18/2016    Past Surgical History:  Procedure Laterality Date   ESOPHAGOGASTRODUODENOSCOPY (EGD) WITH PROPOFOL N/A 01/30/2020   Procedure: ESOPHAGOGASTRODUODENOSCOPY (EGD) WITH PROPOFOL;  Surgeon: Wyline Mood, MD;  Location: [facility] ENDOSCOPY;  Service: Gastroenterology;  Laterality: N/A;   FORAMINOTOMY 1 LEVEL Left 07/12/2023   Procedure: LEFT L5-S1 LAMINOFORAMINOTOMY;  Surgeon: Venetia Night, MD;  Location: [facility] ORS;   Service: Neurosurgery;  Laterality: Left;   LUMBAR LAMINECTOMY/DECOMPRESSION MICRODISCECTOMY Right 03/17/2023   Procedure: RIGHT L5-S1 MICRODISCECTOMY;  Surgeon: Venetia Night, MD;  Location: [facility] ORS;  Service: Neurosurgery;  Laterality: Right;   WISDOM TOOTH EXTRACTION  08/2018   no bleeding complications    OB History     Gravida  3   Para  1   Term  1   Preterm      AB  2   Living  1      SAB  2   IAB      Ectopic      Multiple  0   Live Births  1            Home Medications    Prior to Admission medications   Medication Sig Start Date End Date Taking? Authorizing Provider  diclofenac (VOLTAREN) 75 MG EC tablet Take 1 tablet (75 mg total) by mouth 2 (two) times daily. 08/11/23  Yes Mele Sylvester R, NP  albuterol (VENTOLIN HFA) 108 (90 Base) MCG/ACT inhaler Inhale 1-2 puffs into the lungs every 6 (six) hours as needed for wheezing or shortness of breath. 07/09/23   Lorre Munroe, NP  bisacodyl 5 MG EC tablet Take 5 mg by mouth daily as needed for moderate constipation.    [provider]  ibuprofen (ADVIL) 200 MG tablet Take 400-600 mg by mouth every 8 (eight) hours as needed (pain.).    [provider]  levonorgestrel (MIRENA) 20 MCG/DAY IUD 1 each by Intrauterine route once.    [provider]  methocarbamol (ROBAXIN) 500 MG tablet Take 1 tablet (500 mg total) by mouth every 6 (six) hours as needed for muscle spasms. 07/12/23   Susanne Borders, PA  senna (SENOKOT) 8.6 MG TABS tablet Take 1 tablet (8.6 mg total) by mouth 2 (two) times daily as needed for mild constipation. 07/27/23   Susanne Borders, PA    Family History Family History  Problem Relation Age of Onset   Hypertension Father    Pulmonary embolism Father    Cancer Maternal Grandmother 42       breast   Arthritis Maternal Grandmother    Hyperlipidemia Maternal Grandmother    CAD Maternal Grandfather    Cancer Paternal Grandmother        melanoma, sinus  cancer   Colon polyps Paternal Grandmother    Heart disease Paternal Grandfather    Gaucher's disease Half-Brother    Esophageal cancer Neg Hx    Stomach cancer Neg Hx    Liver disease Neg Hx     Social History Social History   Tobacco Use   Smoking status: Never    Passive exposure: Past   Smokeless tobacco: Never  Vaping Use   Vaping status: Never Used  Substance Use Topics   Alcohol use: No    Alcohol/week: 0.0 standard drinks of alcohol   Drug use: No     Allergies   Tylenol [acetaminophen] and Depo-medrol [methylprednisolone]   Review of Systems Review of Systems   Physical Exam Triage Vital Signs ED Triage Vitals  Encounter Vitals Group     BP 08/11/23 1545 (!) 126/94     Systolic BP Percentile --      Diastolic BP Percentile --      Pulse Rate 08/11/23 1545 87     Resp 08/11/23 1545 20     Temp 08/11/23 1545 98.3 F (36.8 C)     Temp Source 08/11/23 1545 Oral     SpO2 08/11/23 1545 100 %     Weight --      Height --      Head Circumference --      Peak Flow --      Pain Score 08/11/23 1546 7     Pain Loc --      Pain Education --      Exclude from Growth Chart --    No data found.  Updated Vital Signs BP (!) 126/94 (BP Location: Left Arm)   Pulse 87   Temp 98.3 F (36.8 C) (Oral)   Resp 20   SpO2 100%   Visual Acuity Right Eye Distance:   Left Eye Distance:   Bilateral Distance:    Right Eye Near:   Left Eye Near:    Bilateral Near:     Physical Exam Constitutional:      Appearance: Normal appearance.  Eyes:     Extraocular Movements: Extraocular movements intact.  Neck:     Comments: Tenderness present at the base of the right lateral aspect of the neck without ecchymosis swelling or deformity, extends into the superior of this shoulder Musculoskeletal:     Comments: Tenderness generalized over the right trapezius muscle without ecchymosis swelling or deformity, negative Hawking sign, no pain directly over the joint, pain  elicited with abduction of the arm, 2+ carotid and brachial pulse  Neurological:     Mental Status: She is alert.  UC Treatments / Results  Labs (all labs ordered are listed, but only abnormal results are displayed) Labs Reviewed - No data to display  EKG   Radiology No results found.  Procedures Procedures (including critical care time)  Medications Ordered in UC Medications - No data to display  Initial Impression / Assessment and Plan / UC Course  I have reviewed the triage vital signs and the nursing notes.  Pertinent labs & imaging results that were available during my care of the patient were reviewed by me and considered in my medical decision making (see chart for details).  Strain of the right trapezius muscle, acute pain of right shoulder  Etiology most likely muscular, deferring imaging due to lack of injury, Toradol IM given and prescribed diclofenac recommended RICE, heat massage and stretching with activity as tolerated, may use additional home medications for supportive care with follow-up with orthopedics if symptoms do not improve Final Clinical Impressions(s) / UC Diagnoses   Final diagnoses:  Strain of right trapezius muscle, initial encounter  Acute pain of right shoulder     Discharge Instructions      Your pain is most likely caused by irritation to the muscles.  You have been given an injection of Toradol which helps to reduce inflammation and pain and ideally should start to lessen symptoms within the next 30 minutes to an hour  Starting tomorrow take diclofenac twice daily for management of pain, most effective when used consistently  You may use heating pad in 15 minute intervals as needed for additional comfort, or  you may find comfort in using ice in 10-15 minutes over affected area  Begin stretching affected area daily for 10 minutes as tolerated to further loosen muscles   When lying down place pillow underneath arm and behind  back  If pain persist after recommended treatment or reoccurs if may be beneficial to follow up with orthopedic specialist for evaluation, this doctor specializes in the bones and can manage your symptoms long-term with options such as but not limited to imaging, medications or physical therapy      ED Prescriptions     Medication Sig Dispense Auth. Provider   diclofenac (VOLTAREN) 75 MG EC tablet Take 1 tablet (75 mg total) by mouth 2 (two) times daily. 30 tablet Valinda Hoar, NP      PDMP not reviewed this encounter.   Valinda Hoar, NP 08/11/23 1601

## 2023-08-11 NOTE — Discharge Instructions (Addendum)
 Your pain is most likely caused by irritation to the muscles.  You have been given an injection of Toradol which helps to reduce inflammation and pain and ideally should start to lessen symptoms within the next 30 minutes to an hour  Starting tomorrow take diclofenac twice daily for management of pain, most effective when used consistently  You may use heating pad in 15 minute intervals as needed for additional comfort, or  you may find comfort in using ice in 10-15 minutes over affected area  Begin stretching affected area daily for 10 minutes as tolerated to further loosen muscles   When lying down place pillow underneath arm and behind back  If pain persist after recommended treatment or reoccurs if may be beneficial to follow up with orthopedic specialist for evaluation, this doctor specializes in the bones and can manage your symptoms long-term with options such as but not limited to imaging, medications or physical therapy

## 2023-08-24 ENCOUNTER — Encounter: Payer: Self-pay | Admitting: Neurosurgery

## 2023-08-24 ENCOUNTER — Ambulatory Visit (INDEPENDENT_AMBULATORY_CARE_PROVIDER_SITE_OTHER): Payer: 59 | Admitting: Neurosurgery

## 2023-08-24 VITALS — BP 122/88 | Temp 98.0°F | Ht 60.0 in | Wt 180.0 lb

## 2023-08-24 DIAGNOSIS — Z09 Encounter for follow-up examination after completed treatment for conditions other than malignant neoplasm: Secondary | ICD-10-CM

## 2023-08-24 DIAGNOSIS — M5416 Radiculopathy, lumbar region: Secondary | ICD-10-CM

## 2023-08-24 NOTE — Progress Notes (Signed)
   REFERRING PHYSICIAN:  Carollynn Cirri, [address] [place],  [place] [address]  DOS: 07/12/23 Left L5-S1 laminoforaminotomy   HISTORY OF PRESENT ILLNESS: Tamara Mcclain is status post lumbar decompression. She is doing much better.  She has little pain.   PHYSICAL EXAMINATION:  General: Patient is well developed, well nourished, calm, collected, and in no apparent distress.   NEUROLOGICAL:  General: In no acute distress.   Awake, alert, oriented to person, place, and time.  Pupils equal round and reactive to light.  Facial tone is symmetric.  Tongue protrusion is midline.  There is no pronator drift.   Strength:            Side Iliopsoas Quads Hamstring PF DF EHL  R 5 5 5 5 5 5   L 5 5 5 5 5 5    Incision c/d/I and healing well   ROS (Neurologic):  Negative except as noted above  IMAGING: No interval imaging to review  ASSESSMENT/PLAN:  Tamara Mcclain is doing well after lumbar decompression.   She has done very well.  We will see her back in 6 weeks.  Jodeen Munch MD Department of neurosurgery

## 2023-08-27 ENCOUNTER — Encounter: Payer: Self-pay | Admitting: Neurosurgery

## 2023-09-13 ENCOUNTER — Ambulatory Visit: Payer: Self-pay

## 2023-09-13 NOTE — Telephone Encounter (Signed)
 Will discuss at upcoming appt.

## 2023-09-13 NOTE — Telephone Encounter (Signed)
  Chief Complaint: upper back pain, right shoulder blade pain Symptoms: upper back pain, right shoulder blade pain, weakness and tingling in right upper arm (intermittent) Frequency: x 2 weeks, constant Pertinent Negatives: Patient denies loss of bowel or bladder control, fever, injury Disposition: [] ED /[] Urgent Care (no appt availability in office) / [x] Appointment(In office/virtual)/ []  [facility]/ [] Home Care/ [] Refused Recommended Disposition /[] [facility]/ []  Follow-up with PCP Additional Notes: Patient states she has been taking ibuprofen  and oxycodone  for the pain. Offered patient appointment tomorrow with PCP and she states she can not come in until Wednesday. Patient scheduled and verbalizes understanding to call back for new or worsening symptoms.  Copied from CRM [id number]. Topic: Clinical - Red Word Triage >> Sep 13, 2023  9:12 AM Tiffany B wrote: Red Word that prompted transfer to Nurse Triage: Exp. Right shoulder and upper back pain, patient states its severe. Reason for Disposition  [1] Numbness in an arm or hand (i.e., loss of sensation) AND [2] upper back pain  Answer Assessment - Initial Assessment Questions 1. ONSET: "When did the pain begin?"      Couple weeks ago.  2. LOCATION: "Where does it hurt?" (upper, mid or lower back)     Upper back.  3. SEVERITY: "How bad is the pain?"  (e.g., Scale 1-10; mild, moderate, or severe)   - MILD (1-3): Doesn't interfere with normal activities.    - MODERATE (4-7): Interferes with normal activities or awakens from sleep.    - SEVERE (8-10): Excruciating pain, unable to do any normal activities.      4-5/10.  4. PATTERN: "Is the pain constant?" (e.g., yes, no; constant, intermittent)      Constant.  5. RADIATION: "Does the pain shoot into your legs or somewhere else?"     Right shoulder blade.  6. CAUSE:  "What do you think is causing the back pain?"      She is unsure if it's a pulled muscle, felt like  spasm/twitch in shoulder and neck about a month ago.  7. BACK OVERUSE:  "Any recent lifting of heavy objects, strenuous work or exercise?"     Denies.  8. MEDICINES: "What have you taken so far for the pain?" (e.g., nothing, acetaminophen , NSAIDS)     Ibuprofen  and oxycodone  at night.  9. NEUROLOGIC SYMPTOMS: "Do you have any weakness, numbness, or problems with bowel/bladder control?"     Weakness from elbow up to shoulder with tingling if stretching the right arm out.  10. OTHER SYMPTOMS: "Do you have any other symptoms?" (e.g., fever, abdomen pain, burning with urination, blood in urine)       Denies.  11. PREGNANCY: "Is there any chance you are pregnant?" "When was your last menstrual period?"       LMP: unsure has IUD in.  Protocols used: Back Pain-A-AH

## 2023-09-15 ENCOUNTER — Ambulatory Visit (INDEPENDENT_AMBULATORY_CARE_PROVIDER_SITE_OTHER): Admitting: Internal Medicine

## 2023-09-15 ENCOUNTER — Encounter: Payer: Self-pay | Admitting: Internal Medicine

## 2023-09-15 VITALS — BP 122/88 | Ht 60.0 in | Wt 181.8 lb

## 2023-09-15 DIAGNOSIS — R202 Paresthesia of skin: Secondary | ICD-10-CM

## 2023-09-15 DIAGNOSIS — M549 Dorsalgia, unspecified: Secondary | ICD-10-CM | POA: Diagnosis not present

## 2023-09-15 NOTE — Progress Notes (Signed)
 Subjective:    Patient ID: Tamara Mcclain, female    DOB: 2000/11/30, 23 y.o.   MRN: [id number]  HPI  Discussed the use of AI scribe software for clinical note transcription with the patient, who gave verbal consent to proceed.   Tamara Amy Ball "Alston Jerry" is a 23 year old female who presents with right shoulder blade pain radiating to the right arm.  She experiences sharp, stabbing pain in the right shoulder blade area, radiating down to the right elbow. The sensation is described as if there is 'not enough room for my shoulder blade to move.' The pain has been intermittent since April but constant for the last two to three weeks. No specific injury is reported, though lifting her 20-pound son is mentioned as a possible factor.  In addition to the pain, she experiences numbness and a pins-and-needles sensation down her right arm. There is no pain in the shoulder joint itself, but neck movement is limited due to pain radiating down her back. Neck rotation is restricted before pain onset.  She has a history of lumbar laminectomy in March and has used leftover oxycodone  for pain relief, though she dislikes its effects. Muscle relaxers have been tried without noticeable benefit. During an urgent care visit on April 2nd, she was prescribed diclofenac , which was ineffective. A Toradol  injection provided temporary relief for about a day.  The pain in the shoulder blade area is noted to be similar to the pain experienced in her lower back prior to surgeries in November and March.     Review of Systems  Past Medical History:  Diagnosis Date   Anxiety    Asthma    mild; flares up only when sick   Epiploic appendagitis    Lumbosacral disc herniation    L5-S1   MTHFR mutation    Primary hypertension    Von Willebrand disease (HCC) Dr. Hildy Lowers at [facility]   diagnosed when teenager due to large menstrual blood clots; after seeing hematology 2024, levels did not indicate the disease     Current Outpatient Medications  Medication Sig Dispense Refill   albuterol  (VENTOLIN  HFA) 108 (90 Base) MCG/ACT inhaler Inhale 1-2 puffs into the lungs every 6 (six) hours as needed for wheezing or shortness of breath. 8 g 2   ibuprofen  (ADVIL ) 200 MG tablet Take 400-600 mg by mouth every 8 (eight) hours as needed (pain.).     levonorgestrel  (MIRENA ) 20 MCG/DAY IUD 1 each by Intrauterine route once.     No current facility-administered medications for this visit.    Allergies  Allergen Reactions   Tylenol  [Acetaminophen ] Other (See Comments)    fever   Depo-Medrol  [Methylprednisolone ] Nausea Only    Feeling flushed    Family History  Problem Relation Age of Onset   Hypertension Father    Pulmonary embolism Father    Cancer Maternal Grandmother 3       breast   Arthritis Maternal Grandmother    Hyperlipidemia Maternal Grandmother    CAD Maternal Grandfather    Cancer Paternal Grandmother        melanoma, sinus cancer   Colon polyps Paternal Grandmother    Heart disease Paternal Grandfather    Gaucher's disease Half-Brother    Esophageal cancer Neg Hx    Stomach cancer Neg Hx    Liver disease Neg Hx     Social History   Socioeconomic History   Marital status: Married    Spouse name: Shley Pickle.  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  Other Topics Concern   Not on file  Social History Narrative   Not on file   Social Drivers of Health   Financial Resource Strain: Low Risk  (09/13/2023)   Overall  Financial Resource Strain (CARDIA)    Difficulty of Paying Living Expenses: Not hard at all  Food Insecurity: No Food Insecurity (09/13/2023)   Hunger Vital Sign    Worried About Running Out of Food in the Last Year: Never true    Ran Out of Food in the Last Year: Never true  Transportation Needs: No Transportation Needs (09/13/2023)   PRAPARE - Administrator, Civil Service (Medical): No    Lack of Transportation (Non-Medical): No  Physical Activity: Inactive (09/13/2023)   Exercise Vital Sign    Days of Exercise per Week: 0 days    Minutes of Exercise per Session: 20 min  Stress: No Stress Concern Present (09/13/2023)   [employer] of Occupational Health - Occupational Stress Questionnaire    Feeling of Stress : Not at all  Social Connections: Moderately Integrated (09/13/2023)   Social Connection and Isolation Panel [NHANES]    Frequency of Communication with Friends and Family: More than three times a week    Frequency of Social Gatherings with Friends and Family: More than three times a week    Attends Religious Services: 1 to 4 times per year    Active Member of [employer] or Organizations: No    Attends Banker Meetings: Not on file    Marital Status: Married  Catering manager Violence: Not At Risk (08/23/2022)   Humiliation, Afraid, Rape, and Kick questionnaire    Fear of Current or Ex-Partner: No    Emotionally Abused: No    Physically Abused: No    Sexually Abused: No     Constitutional: Denies fever, malaise, fatigue, headache or abrupt weight changes.  Respiratory: Denies difficulty breathing, shortness of breath, cough or sputum production.   Cardiovascular: Denies chest pain, chest tightness, palpitations or swelling in the hands or feet.  Gastrointestinal: Pt reports vomiting. Denies abdominal pain, bloating, constipation, diarrhea or blood in the stool.  Musculoskeletal: Pt reports right upper back pain, decrease in range of motion. Denies  difficulty with gait, muscle pain or joint swelling.  Skin: Denies redness, rashes, lesions or ulcercations.  Neurological: Pt reports paresthesia of RUE. Denies dizziness, difficulty with memory, difficulty with speech or problems with balance and coordination.    No other specific complaints in a complete review of systems (except as listed in HPI above).     Objective:   Physical Exam  BP 122/88 (BP Location: Left Arm, Patient Position: Sitting, Cuff Size: Normal)   Ht 5' (1.524 m)   Wt 181 lb 12.8 oz (82.5 kg)   BMI 35.51 kg/m    Wt Readings from Last 3 Encounters:  08/24/23 180 lb (81.6 kg)  07/27/23 180 lb (81.6 kg)  07/12/23 180 lb (81.6 kg)    General: Appears her stated age, obese, in NAD. HEENT: Head: normal shape and size, no sinus tenderness noted; Eyes: sclera white, no icterus, conjunctiva pink, PERRLA and EOMs intact;  Cardiovascular: Tachycardic with normal rhythm. S1,S2 noted.  No murmur, rubs or gallops noted.  Pulmonary/Chest: Normal effort and positive vesicular breath sounds. No respiratory distress. No wheezes, rales or ronchi noted.  Musculoskeletal: Decreased flexion, extension and rotation of the cervical spine secondary to pain.  No pain with palpation over the cervical spine.  Decreased internal and external rotation of the right shoulder secondary to pain.  No pain with palpation of the shoulder.  Pain with palpation in the right subscapular area.  Shoulder shrug equal.  Strength 5/5 BUE.  Handgrips equal.  No difficulty with gait.  Neurological: Alert and oriented.  Coordination normal of the right hand.   BMET    Component Value Date/Time   NA 136 07/06/2023 0840   NA 136 01/28/2022 1047   K 4.5 07/06/2023 0840   CL 105 07/06/2023 0840   CO2 25 07/06/2023 0840   GLUCOSE 88 07/06/2023 0840   BUN 8 07/06/2023 0840   BUN 4 (L) 01/28/2022 1047   CREATININE 0.74 07/06/2023 0840   CALCIUM 9.2 07/06/2023 0840   GFRNONAA >60 07/06/2023 0840   GFRAA  >60 04/09/2018 2151    Lipid Panel     Component Value Date/Time   CHOL 168 11/27/2021 1428   TRIG 145 11/27/2021 1428   HDL 32 (L) 11/27/2021 1428   CHOLHDL 5.3 (H) 11/27/2021 1428   LDLCALC 110 (H) 11/27/2021 1428    CBC    Component Value Date/Time   WBC 7.4 07/06/2023 0840   RBC 5.37 (H) 07/06/2023 0840   HGB 14.0 07/06/2023 0840   HGB 11.8 06/09/2022 0926   HCT 43.1 07/06/2023 0840   HCT 36.2 06/09/2022 0926   PLT 319 07/06/2023 0840   PLT 324 06/09/2022 0926   MCV 80.3 07/06/2023 0840   MCV 90 06/09/2022 0926   MCH 26.1 07/06/2023 0840   MCHC 32.5 07/06/2023 0840   RDW 13.7 07/06/2023 0840   RDW 13.4 06/09/2022 0926   LYMPHSABS 1.5 06/09/2022 0926   MONOABS 0.6 07/24/2021 1612   EOSABS 0.0 06/09/2022 0926   BASOSABS 0.0 06/09/2022 0926    Hgb A1C Lab Results  Component Value Date   HGBA1C 5.3 01/28/2022           Assessment & Plan:   Assessment and Plan    Right upper back pain, right arm pain DDx includes shoulder impingement versus cervical/thoracic radiculitis Limited neck movement with pain radiating to the right arm with numbness and paresthesia. Oxycodone , muscle relaxers, and diclofenac  ineffective. Temporary relief from Toradol . Neurosurgery evaluation scheduled. - Follow up with neurosurgery for evaluation and management.   Schedule an appointment for your annual exam  Helayne Lo, NP

## 2023-09-15 NOTE — Patient Instructions (Signed)
 Paresthesia Paresthesia is a burning or prickling feeling. This feeling can happen in any part of the body. It often happens in the hands, arms, legs, or feet. Usually, it is not painful. In most cases, the feeling goes away in a short time and is not a sign of a serious problem. If you have paresthesia that lasts a long time, you need to see your doctor. Follow these instructions at home: Nutrition Eat a healthy diet. This includes: Eating foods that are high in fiber. These include beans, whole grains, and fresh fruits and vegetables. Limiting foods that are high in fat and sugar. These include fried or sweet foods.  Alcohol use  Do not drink alcohol if: Your doctor tells you not to drink. You are pregnant, may be pregnant, or are planning to become pregnant. If you drink alcohol: Limit how much you have to: 0-1 drink a day for women. 0-2 drinks a day for men. Know how much alcohol is in your drink. In the [place] one drink equals one 12 oz bottle of beer (355 mL), one 5 oz glass of wine (148 mL), or one 1 oz glass of hard liquor (44 mL). General instructions Take over-the-counter and prescription medicines only as told by your doctor. Do not smoke or use any products that contain nicotine or tobacco. If you need help quitting, ask your doctor. If you have diabetes, work with your doctor to make sure your blood sugar stays in a healthy range. If your feet feel numb: Check for redness, warmth, and swelling every day. Wear padded socks and comfortable shoes. These help protect your feet. Keep all follow-up visits. Contact a doctor if: You have paresthesia that gets worse or does not go away. You lose feeling (have numbness) after an injury. Your burning or prickling feeling gets worse when you walk. You have pain or cramps. You feel dizzy or you faint. You have a rash. Get help right away if: You feel weak or have new weakness in an arm or leg. You have trouble walking or  moving. You have problems speaking, understanding, or seeing. You feel confused. You cannot control when you pee (urinate) or poop (have a bowel movement). These symptoms may be an emergency. Get help right away. Call 911. Do not wait to see if the symptoms will go away. Do not drive yourself to the hospital. Summary Paresthesia is a burning or prickling feeling. It often happens in the hands, arms, legs, or feet. In most cases, the feeling goes away in a short time and is not a sign of a serious problem. If you have paresthesia that lasts a long time, you need to be seen by your doctor. This information is not intended to replace advice given to you by your health care provider. Make sure you discuss any questions you have with your health care provider. Document Revised: 01/06/2021 Document Reviewed: 01/06/2021 Elsevier Patient Education  2024 [employer].

## 2023-09-15 NOTE — Progress Notes (Deleted)
 Referring Physician:  Carollynn Cirri, NP [address] [place],  [place] [address]  Primary Physician:  Carollynn Cirri, NP  History of Present Illness: [name] has a history of HTN, asthma, Von Willebrand disease, GAD, hypercholesterolemia.   She is s/p left L5-S1 laminoforaminotomy on 07/12/23. She was doing well at her last visit. She is also s/p right L5-S1 microdiscectomy on 03/17/23.   Here today with new complaints of neck pain radiation to right shoulder blade and down right arm to her elbow that has been constant for last 2-3 weeks. No known injury, but she does lift her 20lb son often. She has numbness and tingling in her arm.     Duration: *** Location: *** Quality: *** Severity: ***  Precipitating: aggravated by *** Modifying factors: made better by *** Weakness: none Timing: ***  She does not smoke.   Bowel/Bladder Dysfunction: none  Conservative measures:  Physical therapy: ***  Multimodal medical therapy including regular antiinflammatories: ***  Injections: *** epidural steroid injections  Past Surgery: *** 07/12/23 Left L5-S1 laminoforaminotomy  03/17/2023 (R L5/S1 microdiscectomy)   [name] has ***no symptoms of cervical myelopathy.  The symptoms are causing a significant impact on the patient's life.   Review of Systems:  A 10 point review of systems is negative, except for the pertinent positives and negatives detailed in the HPI.  Past Medical History: Past Medical History:  Diagnosis Date   Anxiety    Asthma    mild; flares up only when sick   Epiploic appendagitis    Lumbosacral disc herniation    L5-S1   MTHFR mutation    Primary hypertension    Von Willebrand disease (HCC) Dr. Hildy Lowers at [facility]   diagnosed when teenager due to large menstrual blood clots; after seeing hematology 2024, levels did not indicate the disease    Past Surgical History: Past Surgical History:  Procedure Laterality Date    ESOPHAGOGASTRODUODENOSCOPY (EGD) WITH PROPOFOL  N/A 01/30/2020   Procedure: ESOPHAGOGASTRODUODENOSCOPY (EGD) WITH PROPOFOL ;  Surgeon: Luke Salaam, MD;  Location: [facility] ENDOSCOPY;  Service: Gastroenterology;  Laterality: N/A;   FORAMINOTOMY 1 LEVEL Left 07/12/2023   Procedure: LEFT L5-S1 LAMINOFORAMINOTOMY;  Surgeon: Jodeen Munch, MD;  Location: [facility] ORS;  Service: Neurosurgery;  Laterality: Left;   LUMBAR LAMINECTOMY/DECOMPRESSION MICRODISCECTOMY Right 03/17/2023   Procedure: RIGHT L5-S1 MICRODISCECTOMY;  Surgeon: Jodeen Munch, MD;  Location: [facility] ORS;  Service: Neurosurgery;  Laterality: Right;   WISDOM TOOTH EXTRACTION  08/2018   no bleeding complications    Allergies: Allergies as of 09/16/2023 - Review Complete 09/15/2023  Allergen Reaction Noted   Tylenol  [acetaminophen ] Other (See Comments) 08/18/2016   Depo-medrol  [methylprednisolone ] Nausea Only 10/24/2019    Medications: Outpatient Encounter Medications as of 09/16/2023  Medication Sig   albuterol  (VENTOLIN  HFA) 108 (90 Base) MCG/ACT inhaler Inhale 1-2 puffs into the lungs every 6 (six) hours as needed for wheezing or shortness of breath.   ibuprofen  (ADVIL ) 200 MG tablet Take 400-600 mg by mouth every 8 (eight) hours as needed (pain.).   levonorgestrel  (MIRENA ) 20 MCG/DAY IUD 1 each by Intrauterine route once.   No facility-administered encounter medications on file as of 09/16/2023.    Social History: Social History   Tobacco Use   Smoking status: Never    Passive exposure: Past   Smokeless tobacco: Never  Vaping Use   Vaping status: Never Used  Substance Use Topics   Alcohol use: No    Alcohol/week: 0.0 standard drinks of alcohol  Drug use: No    Family Medical History: Family History  Problem Relation Age of Onset   Hypertension Father    Pulmonary embolism Father    Cancer Maternal Grandmother 79       breast   Arthritis Maternal Grandmother    Hyperlipidemia Maternal Grandmother    CAD Maternal  Grandfather    Cancer Paternal Grandmother        melanoma, sinus cancer   Colon polyps Paternal Grandmother    Heart disease Paternal Grandfather    Gaucher's disease Half-Brother    Esophageal cancer Neg Hx    Stomach cancer Neg Hx    Liver disease Neg Hx     Physical Examination: There were no vitals filed for this visit.  General: Patient is well developed, well nourished, calm, collected, and in no apparent distress. Attention to examination is appropriate.  Respiratory: Patient is breathing without any difficulty.   NEUROLOGICAL:     Awake, alert, oriented to person, place, and time.  Speech is clear and fluent. Fund of knowledge is appropriate.   Cranial Nerves: Pupils equal round and reactive to light.  Facial tone is symmetric.    *** ROM of cervical spine *** pain *** posterior cervical tenderness. *** tenderness in bilateral trapezial region.   *** ROM of lumbar spine *** pain *** posterior lumbar tenderness.   No abnormal lesions on exposed skin.   Strength: Side Biceps Triceps Deltoid Interossei Grip Wrist Ext. Wrist Flex.  R 5 5 5 5 5 5 5   L 5 5 5 5 5 5 5    Side Iliopsoas Quads Hamstring PF DF EHL  R 5 5 5 5 5 5   L 5 5 5 5 5 5    Reflexes are ***2+ and symmetric at the biceps, brachioradialis, patella and achilles.   Hoffman's is absent.  Clonus is not present.   Bilateral upper and lower extremity sensation is intact to light touch.     Gait is normal.   ***No difficulty with tandem gait.    Medical Decision Making  Imaging: none  Assessment and Plan: Tamara Mcclain is a pleasant 23 y.o. female has ***  Treatment options discussed with patient and following plan made:   - Order for physical therapy for *** spine ***. Patient to call to schedule appointment. *** - Continue current medications including ***. Reviewed dosing and side effects.  - Prescription for ***. Reviewed dosing and side effects. Take with food.  - Prescription for *** to take  prn muscle spasms. Reviewed dosing and side effects. Discussed this can cause drowsiness.  - MRI of *** to further evaluate *** radiculopathy. No improvement time or medications (***).  - Referral to PMR at [facility] to discuss possible *** injections.  - Will schedule phone visit to review MRI results once I get them back.   I spent a total of *** minutes in face-to-face and non-face-to-face activities related to this patient's care today including review of outside records, review of imaging, review of symptoms, physical exam, discussion of differential diagnosis, discussion of treatment options, and documentation.   Thank you for involving me in the care of this patient.   [name] PA-C Dept. of Neurosurgery

## 2023-09-16 ENCOUNTER — Ambulatory Visit: Admitting: Orthopedic Surgery

## 2023-09-16 NOTE — Progress Notes (Signed)
 Referring Physician:  Carollynn Cirri, NP [address] [place],  [place] [address]  Primary Physician:  Carollynn Cirri, NP  History of Present Illness: [name] has a history of HTN, asthma, Von Willebrand disease, GAD, hypercholesterolemia.   She is s/p left L5-S1 laminoforaminotomy on 07/12/23. She was doing well at her last visit. She is also s/p right L5-S1 microdiscectomy on 03/17/23.   She has minimal neck pain along with constant shooting pain into right shoulder blade into her mid back. She also has constant right arm pain to just below her elbow. No left arm pain. She has intermittent mid back pain as well. Symptoms started 6-8 weeks ago and has been worse in last 2-3 weeks. She has numbness, tingling, and weakness in her arm and shoulder. Pain is worse with moving/stretching her arm. Pain is better with laying down. No known injury, but she does lift her 20lb son often.   No relief with oxycodone , diclofenac , or muscle relaxers.   She does not smoke.   Bowel/Bladder Dysfunction: none  Conservative measures:  Physical therapy: nothing recent  Multimodal medical therapy including regular antiinflammatories: advil , diclofenac , oxycodone   Injections:  None recent  Past Surgery:  07/12/23 Left L5-S1 laminoforaminotomy  03/17/2023 Right L5/S1 microdiscectomy  Tamara Mcclain has no symptoms of cervical myelopathy.  The symptoms are causing a significant impact on the patient's life.   Review of Systems:  A 10 point review of systems is negative, except for the pertinent positives and negatives detailed in the HPI.  Past Medical History: Past Medical History:  Diagnosis Date   Anxiety    Asthma    mild; flares up only when sick   Epiploic appendagitis    Lumbosacral disc herniation    L5-S1   MTHFR mutation    Primary hypertension    Von Willebrand disease (HCC) Dr. Hildy Lowers at [facility]   diagnosed when teenager due to large menstrual blood clots; after seeing  hematology 2024, levels did not indicate the disease    Past Surgical History: Past Surgical History:  Procedure Laterality Date   ESOPHAGOGASTRODUODENOSCOPY (EGD) WITH PROPOFOL  N/A 01/30/2020   Procedure: ESOPHAGOGASTRODUODENOSCOPY (EGD) WITH PROPOFOL ;  Surgeon: Luke Salaam, MD;  Location: [facility] ENDOSCOPY;  Service: Gastroenterology;  Laterality: N/A;   FORAMINOTOMY 1 LEVEL Left 07/12/2023   Procedure: LEFT L5-S1 LAMINOFORAMINOTOMY;  Surgeon: Jodeen Munch, MD;  Location: [facility] ORS;  Service: Neurosurgery;  Laterality: Left;   LUMBAR LAMINECTOMY/DECOMPRESSION MICRODISCECTOMY Right 03/17/2023   Procedure: RIGHT L5-S1 MICRODISCECTOMY;  Surgeon: Jodeen Munch, MD;  Location: [facility] ORS;  Service: Neurosurgery;  Laterality: Right;   WISDOM TOOTH EXTRACTION  08/2018   no bleeding complications    Allergies: Allergies as of 09/17/2023 - Review Complete 09/15/2023  Allergen Reaction Noted   Tylenol  [acetaminophen ] Other (See Comments) 08/18/2016   Depo-medrol  [methylprednisolone ] Nausea Only 10/24/2019    Medications: Outpatient Encounter Medications as of 09/17/2023  Medication Sig   albuterol  (VENTOLIN  HFA) 108 (90 Base) MCG/ACT inhaler Inhale 1-2 puffs into the lungs every 6 (six) hours as needed for wheezing or shortness of breath.   ibuprofen  (ADVIL ) 200 MG tablet Take 400-600 mg by mouth every 8 (eight) hours as needed (pain.).   levonorgestrel  (MIRENA ) 20 MCG/DAY IUD 1 each by Intrauterine route once.   No facility-administered encounter medications on file as of 09/17/2023.    Social History: Social History   Tobacco Use   Smoking status: Never    Passive exposure: Past   Smokeless tobacco:  Never  Vaping Use   Vaping status: Never Used  Substance Use Topics   Alcohol use: No    Alcohol/week: 0.0 standard drinks of alcohol   Drug use: No    Family Medical History: Family History  Problem Relation Age of Onset   Hypertension Father    Pulmonary embolism Father     Cancer Maternal Grandmother 55       breast   Arthritis Maternal Grandmother    Hyperlipidemia Maternal Grandmother    CAD Maternal Grandfather    Cancer Paternal Grandmother        melanoma, sinus cancer   Colon polyps Paternal Grandmother    Heart disease Paternal Grandfather    Gaucher's disease Half-Brother    Esophageal cancer Neg Hx    Stomach cancer Neg Hx    Liver disease Neg Hx     Physical Examination: There were no vitals filed for this visit.  General: Patient is well developed, well nourished, calm, collected, and in no apparent distress. Attention to examination is appropriate.  Respiratory: Patient is breathing without any difficulty.   NEUROLOGICAL:     Awake, alert, oriented to person, place, and time.  Speech is clear and fluent. Fund of knowledge is appropriate.   Cranial Nerves: Pupils equal round and reactive to light.    No posterior cervical tenderness. Mild tenderness in right trapezial region. Mild periscapular tenderness on right > left.   No abnormal lesions on exposed skin.   Strength: Side Biceps Triceps Deltoid Interossei Grip Wrist Ext. Wrist Flex.  R 5 5 5 5 5 5 5   L 5 5 5 5 5 5 5    Side Iliopsoas Quads Hamstring PF DF EHL  R 5 5 5 5 5 5   L 5 5 5 5 5 5    Reflexes are 2+ and symmetric at the biceps, brachioradialis, patella and achilles.   Hoffman's is absent.  Clonus is not present.   Bilateral upper and lower extremity sensation is intact to light touch, but diminished in right arm compared to left.   She has limited, painful ROM of her right shoulder. She has pain with stress of rotator cuff. She has pain with IR/ER of right shoulder.   Good ROM of left shoulder with no pain.   Gait is normal.      Medical Decision Making  Imaging: none  Assessment and Plan: Ms. Caruthers 6-8 history of neck pain with radiation of pain into right shoulder blade and down the arm to past her elbow. No left arm pain. She has numbness, tingling, and  weakness in her right arm and shoulder. Pain is worse with moving/stretching her arm.   No cervical imaging. Component of pain is likely cervical mediated, but I think a lot of her pain is from right shoulder as well. She has painful/limited ROM of right shoulder on exam.   Treatment options discussed with patient and following plan made:   - MRI of cervical spine to further evaluate right cervical radiculopathy.  - PT for cervical spine. Orders to [facility] in [place].  - Stop methocarbamol . New prescription for flexeril to take prn muscle spasms. Reviewed dosing and side effects. Discussed this can cause drowsiness.  - No relief with motrin . New prescription for mobic. Reviewed dosing and side effects. Take with food.  - Allergy noted to tylenol . No side effects with motrin . Will stop mobic if any concerns.  - Unable to tolerate medrol  dose pack in the past due to side  effects.  - Referral to ortho at [facility] for right shoulder pain.  - Will schedule follow visit to review MRI results once I get them back.   I spent a total of 30 minutes in face-to-face and non-face-to-face activities related to this patient's care today including review of outside records, review of imaging, review of symptoms, physical exam, discussion of differential diagnosis, discussion of treatment options, and documentation.   [name] PA-C Dept. of Neurosurgery

## 2023-09-17 ENCOUNTER — Ambulatory Visit (INDEPENDENT_AMBULATORY_CARE_PROVIDER_SITE_OTHER): Admitting: Orthopedic Surgery

## 2023-09-17 ENCOUNTER — Encounter: Payer: Self-pay | Admitting: Orthopedic Surgery

## 2023-09-17 VITALS — BP 128/82 | Ht 60.0 in | Wt 181.6 lb

## 2023-09-17 DIAGNOSIS — M542 Cervicalgia: Secondary | ICD-10-CM | POA: Diagnosis not present

## 2023-09-17 DIAGNOSIS — M5412 Radiculopathy, cervical region: Secondary | ICD-10-CM

## 2023-09-17 DIAGNOSIS — M25511 Pain in right shoulder: Secondary | ICD-10-CM

## 2023-09-17 MED ORDER — CYCLOBENZAPRINE HCL 10 MG PO TABS: 10.0000 mg | ORAL_TABLET | Freq: Three times a day (TID) | ORAL | 0 refills | Status: DC | PRN

## 2023-09-17 MED ORDER — MELOXICAM 15 MG PO TABS: 15.0000 mg | ORAL_TABLET | Freq: Every day | ORAL | 2 refills | Status: DC

## 2023-09-17 NOTE — Patient Instructions (Signed)
 It was so nice to see you today. Thank you so much for coming in.    I want to get an MRI of your neck to look into things further. We will get this approved through your insurance and [facility] will call you to schedule the appointment. Ask about your patient responsibility. You do not need to pay this prior to getting MRI, they can bill you.   After you have the MRI, it takes 14-21 days for me to get the results back. Once I have them, we will call you to schedule a follow up visit with me to review them.   I think some of your pain is from your right shoulder. I want you to see ortho at the [facility] clinic for evaluation of this. They should call you to schedule an appointment or you can call them at [phone]. If they can't see you by the end of the month then let me know.   I sent physical therapy orders to [facility]. You can call them at [phone] if you don't hear from them to schedule your visit. They are located at [address], [address].   I sent a prescription for meloxicam to help with pain and inflammation. Take as directed with food. Stop this if you have any side effects.   I also sent a prescription for cyclobenzaprine to help with muscle spasms. Use only as needed and be careful, this can make you sleepy.   Please do not hesitate to call if you have any questions or concerns. You can also message me in MyChart.   [name] PA-C [phone]     The physicians and staff at [facility] are committed to providing excellent care. You may receive a survey asking for feedback about your experience at our office. We value you your feedback and appreciate you taking the time to to fill it out. The [facility] leadership team is also available to discuss your experience in person, feel free to contact us  [phone].

## 2023-09-20 ENCOUNTER — Telehealth: Payer: Self-pay | Admitting: Orthopedic Surgery

## 2023-09-20 ENCOUNTER — Ambulatory Visit
Admission: RE | Admit: 2023-09-20 | Discharge: 2023-09-20 | Disposition: A | Discharge status: Home / Self Care | Source: Ambulatory Visit | Attending: Orthopedic Surgery | Admitting: Orthopedic Surgery

## 2023-09-20 DIAGNOSIS — M542 Cervicalgia: Secondary | ICD-10-CM | POA: Insufficient documentation

## 2023-09-20 DIAGNOSIS — M5412 Radiculopathy, cervical region: Secondary | ICD-10-CM | POA: Insufficient documentation

## 2023-09-20 NOTE — Telephone Encounter (Signed)
 I called the reading room to have it read sooner. Dr. Mont Antis looked at the images and told me to let her know he didn't see anything alarming but we will wait on the read to be sure. Patient notified and voiced understanding.

## 2023-09-20 NOTE — Telephone Encounter (Signed)
 Patient is calling to let our office know that she is having her MRI this morning and is wondering if the results could be requested as STAT. She states that she has been in a lot of pain over the weekend.

## 2023-09-20 NOTE — Telephone Encounter (Signed)
 Her appointment was for 10. Please call anytime after 11 to see if this can be read today. Does not need to be done STAT.

## 2023-09-21 ENCOUNTER — Encounter: Payer: Self-pay | Admitting: Orthopedic Surgery

## 2023-09-21 NOTE — Telephone Encounter (Signed)
 Cervical MRI dated 09/20/23:  FINDINGS: Alignment: Straightening of cervical lordosis. No significant scoliosis or spondylolisthesis.   Vertebrae: Visualized bone marrow signal is within normal limits. No marrow edema or evidence of acute osseous abnormality.   Cord: Normal.   Posterior Fossa, vertebral arteries, paraspinal tissues: Cervicomedullary junction is within normal limits. Negative visible posterior fossa. Partially empty sella incidentally noted (series 5, image 8).   Preserved major vascular flow voids in the bilateral neck. Pronounced adenoid hypertrophy (series 7, image 8) although probably regressed compared to the 2007 face CT. Prominent retropharyngeal and cervical lymph nodes also but within normal limits. Otherwise negative visible neck soft tissues, lung apices.   Disc levels:   C2-C3:  Negative.   C3-C4: Subtle left foraminal disc bulging and endplate spurring. Mild left C4 neural foraminal stenosis (series 8, image 12).   C4-C5:  Negative.   C5-C6: Broad-based left foraminal disc osteophyte complex more so on the left side series 8, image 22. Mild left C6 neural foraminal stenosis.   C6-C7:  Negative.   C7-T1:  Mild facet hypertrophy, symmetric.  No significant stenosis.   Negative visible upper thoracic levels.   IMPRESSION: 1. Minimal cervical spine degeneration. No discrete disc herniation, no spinal stenosis. Up to mild left C4 and C6 nerve level foraminal stenosis related to disc bulging and/or endplate spurring. No convincing right side neural impingement.   2. Prominent chronic adenoid hypertrophy, although probably regressed from a 2007 Face CT and physiologic. With physiologic appearing retropharyngeal and cervical lymph nodes.     Electronically Signed   By: Marlise Simpers M.D.   On: 09/21/2023 07:57  I have personally reviewed the images and agree with the above interpretation.

## 2023-09-23 ENCOUNTER — Ambulatory Visit: Admitting: Orthopedic Surgery

## 2023-09-28 ENCOUNTER — Encounter: Payer: 59 | Admitting: Neurosurgery

## 2023-09-29 ENCOUNTER — Encounter: Admitting: Physician Assistant

## 2023-10-04 NOTE — Progress Notes (Deleted)
   REFERRING PHYSICIAN:  Grayling Leach [address] [place],  [place] [address]  DOS: 07/12/23 Left L5-S1 laminoforaminotomy   HISTORY OF PRESENT ILLNESS:  Was doing well at last visit for her back. Saw me on 09/17/23 for right shoulder pain that I felt was shoulder mediated. She saw ortho and was sent to PT> he also changed her to flexeril .   She is here for follow up of her lower spine.       PHYSICAL EXAMINATION:  General: Patient is well developed, well nourished, calm, collected, and in no apparent distress.   NEUROLOGICAL:  General: In no acute distress.   Awake, alert, oriented to person, place, and time.  Pupils equal round and reactive to light.  Facial tone is symmetric.  Tongue protrusion is midline.  There is no pronator drift.   Strength:            Side Iliopsoas Quads Hamstring PF DF EHL  R 5 5 5 5 5 5   L 5 5 5 5 5 5    Incision well healed   ROS (Neurologic):  Negative except as noted above  IMAGING: No interval imaging to review  ASSESSMENT/PLAN:  Tyne Ellen Bingley is doing well after lumbar decompression.   She has done very well.  We will see her back in 6 weeks.    [name] PA-C Department of neurosurgery

## 2023-10-10 NOTE — Progress Notes (Deleted)
   REFERRING PHYSICIAN:  Grayling Leach [address] [place],  [place] [address]  DOS: 07/12/23 Left L5-S1 laminoforaminotomy   HISTORY OF PRESENT ILLNESS:  Was doing well at last visit for her back. Saw me on 09/17/23 for right shoulder pain that I felt was shoulder mediated. She saw ortho and was sent to PT> he also changed her to flexeril .   She is here for follow up of her lower spine.       PHYSICAL EXAMINATION:  General: Patient is well developed, well nourished, calm, collected, and in no apparent distress.  Awake, alert, oriented to person, place, and time.  Speech is clear and fluent. Fund of knowledge is appropriate.    Cranial Nerves: Pupils equal round and reactive to light.     No posterior cervical tenderness. Mild tenderness in right trapezial region. Mild periscapular tenderness on right > left.   Well healed lumbar incision.    No abnormal lesions on exposed skin.    Strength: Side Biceps Triceps Deltoid Interossei Grip Wrist Ext. Wrist Flex.  R 5 5 5 5 5 5 5   L 5 5 5 5 5 5 5     Side Iliopsoas Quads Hamstring PF DF EHL  R 5 5 5 5 5 5   L 5 5 5 5 5 5     Reflexes are 2+ and symmetric at the biceps, brachioradialis, patella and achilles.   Hoffman's is absent.  Clonus is not present.   Bilateral upper and lower extremity sensation is intact to light touch, but diminished in right arm compared to left.    She has limited, painful ROM of her right shoulder. She has pain with stress of rotator cuff. She has pain with IR/ER of right shoulder.    Good ROM of left shoulder with no pain.    Gait is normal.      ROS (Neurologic):  Negative except as noted above  IMAGING: No interval imaging to review  ASSESSMENT/PLAN:  Tamara Mcclain is doing well after lumbar decompression.   She has done very well.  We will see her back in 6 weeks.    Tamara Russel PA-C Department of neurosurgery

## 2023-10-11 ENCOUNTER — Encounter: Admitting: Orthopedic Surgery

## 2023-10-14 ENCOUNTER — Ambulatory Visit: Admitting: Orthopedic Surgery

## 2023-10-14 ENCOUNTER — Encounter: Payer: Self-pay | Admitting: Orthopedic Surgery

## 2023-10-14 VITALS — BP 124/84 | Ht 60.0 in | Wt 181.0 lb

## 2023-10-14 DIAGNOSIS — M47816 Spondylosis without myelopathy or radiculopathy, lumbar region: Secondary | ICD-10-CM

## 2023-10-14 DIAGNOSIS — Z9889 Other specified postprocedural states: Secondary | ICD-10-CM

## 2023-10-14 DIAGNOSIS — M4726 Other spondylosis with radiculopathy, lumbar region: Secondary | ICD-10-CM

## 2023-10-14 DIAGNOSIS — R29898 Other symptoms and signs involving the musculoskeletal system: Secondary | ICD-10-CM

## 2023-10-14 DIAGNOSIS — M5416 Radiculopathy, lumbar region: Secondary | ICD-10-CM

## 2023-10-14 MED ORDER — OXYCODONE HCL 5 MG PO CAPS
5.0000 mg | ORAL_CAPSULE | Freq: Two times a day (BID) | ORAL | 0 refills | Status: DC | PRN
Start: 2023-10-14 — End: 2024-01-04

## 2023-10-14 NOTE — Patient Instructions (Signed)
 It was so nice to see you today. Thank you so much for coming in.    I want to get an MRI of your lower back to look into things further. We will get this approved through your insurance and [facility] will call you to schedule the appointment. Ask about your patient responsibility. You do not need to pay this prior to getting MRI, they can bill you.   After you have the MRI, let me know it was done so we can get it read. Once I have the results, we can set up a follow up.  I also sent a prescription for oxycodone  to take for severe pain only. Take least amount possible. Remember it can make you sleepy and/or constipated.    Please do not hesitate to call if you have any questions or concerns. You can also message me in MyChart.   [name] PA-C [phone]     The physicians and staff at [facility] Neurosurgery at [facility] are committed to providing excellent care. You may receive a survey asking for feedback about your experience at our office. We value you your feedback and appreciate you taking the time to to fill it out. The [facility] leadership team is also available to discuss your experience in person, feel free to contact us  [phone].

## 2023-10-14 NOTE — Progress Notes (Signed)
 REFERRING PHYSICIAN:  Grayling Leach [address] [place],  [place] [address]  DOS: 07/12/23 Left L5-S1 laminoforaminotomy  DOS: 03/17/2023 (R L5/S1 microdiscectomy)   HISTORY OF PRESENT ILLNESS:  Was doing well at last visit for her back. Saw me on 09/17/23 for right shoulder pain that I felt was shoulder mediated. She saw ortho and was sent to PT- he also changed her to flexeril .   She is here for follow up of her lower spine.   Her neck/right shoulder pain is feeling better. She was unable to go to PT due to high copays, but pain improved over time. She has no current pain.   On Saturday, she noted increased right sided LBP with right posterior leg pain to her foot. She has numbness and spasms in right leg along with tingling. Pain feels just like it did prior to her last surgery. She has weakness in right leg as well. No known injury.   No relief with flexeril  or mobic . She took an oxycodone  last night and it didn't even help.   Did not tolerate neurontin  in the past. Unable to do injections due to allergy to prednisone .   No bowel or bladder issues.    PHYSICAL EXAMINATION:  General: Patient is well developed, well nourished, calm, collected, and in no apparent distress.  Awake, alert, oriented to person, place, and time.  Speech is clear and fluent. Fund of knowledge is appropriate.    Cranial Nerves: Pupils equal round and reactive to light.     No posterior cervical tenderness. Mild tenderness in right trapezial region. Mild periscapular tenderness on right > left.   Well healed lumbar incision. No tenderness noted.    No abnormal lesions on exposed skin.    Strength:  Side Iliopsoas Quads Hamstring PF DF EHL  R 5 5 5 5  4- 4-  L 5 5 5 5 5 5     Reflexes are 2+ and symmetric at the patella and achilles.    Clonus is not present.    Positive SLR on right.   Bilateral  lower extremity sensation is intact to light touch, but diminished in right leg compared to left.     Gait is normal.      ROS (Neurologic):  Negative except as noted above  IMAGING: No interval imaging to review  ASSESSMENT/PLAN:  Nariah Ellen Claycomb was doing well until Saturday when she noted increased right sided LBP with right posterior leg pain to her foot. She has numbness and spasms in right leg along with tingling. Pain feels just like it did prior to her last surgery. She has weakness in right leg as well. No known injury.   She has in right DF and EHL along with positive SLR on right.   Treatment options reviewed with patient and following plan made:   - MRI of lumbar spine to evaluate new weakness in right foot.  - Continue on prn flexeril .  - Unable to tolerate neurontin . Unable to have injections due to allergy.  - One time refill of oxycodone  given to take for severe pain. Reviewed dosing and side effects. PMP reviewed and is appropriate.  - She will let me know when MRI is completed so I can get it read, will message her with results and then we can regroup with further plan.   I spent a total of 20 minutes in face-to-face and non-face-to-face activities related to this patient's care today including review of outside records, review of  imaging, review of symptoms, physical exam, discussion of differential diagnosis, discussion of treatment options, and documentation.   [name] PA-C Department of neurosurgery

## 2023-10-17 ENCOUNTER — Ambulatory Visit
Admission: RE | Admit: 2023-10-17 | Discharge: 2023-10-17 | Disposition: A | Discharge status: Home / Self Care | Source: Ambulatory Visit | Attending: Orthopedic Surgery | Admitting: Orthopedic Surgery

## 2023-10-17 DIAGNOSIS — M47816 Spondylosis without myelopathy or radiculopathy, lumbar region: Secondary | ICD-10-CM | POA: Diagnosis present

## 2023-10-17 DIAGNOSIS — Z9889 Other specified postprocedural states: Secondary | ICD-10-CM | POA: Insufficient documentation

## 2023-10-17 DIAGNOSIS — R29898 Other symptoms and signs involving the musculoskeletal system: Secondary | ICD-10-CM | POA: Diagnosis present

## 2023-10-17 DIAGNOSIS — M5416 Radiculopathy, lumbar region: Secondary | ICD-10-CM | POA: Diagnosis present

## 2023-10-18 ENCOUNTER — Encounter: Payer: Self-pay | Admitting: Orthopedic Surgery

## 2023-10-18 DIAGNOSIS — M5416 Radiculopathy, lumbar region: Secondary | ICD-10-CM

## 2023-10-18 DIAGNOSIS — Z9889 Other specified postprocedural states: Secondary | ICD-10-CM

## 2023-10-18 DIAGNOSIS — R29898 Other symptoms and signs involving the musculoskeletal system: Secondary | ICD-10-CM

## 2023-10-18 NOTE — Addendum Note (Signed)
 Addended byLucetta Russel on: 10/18/2023 05:10 PM   Modules accepted: Orders

## 2023-10-18 NOTE — Progress Notes (Signed)
 MRI of lumbar spine dated 10/17/23:  FINDINGS: Segmentation: Mildly transitional anatomy with same numbering system as on the February MRI, when lowest full size ribs are designated T12 there is a vestigial S1-S2 disc space with partially lumbarized S1 level. Correlation with radiographs is recommended prior to any operative intervention.   Alignment: Stable lumbar lordosis. Mild underlying levoconvex lumbar scoliosis. No significant spondylolisthesis.   Vertebrae: Normal background bone marrow signal. Maintained vertebral height. Postoperative changes at the L5-S1 level, detailed below. No marrow edema or evidence of acute osseous abnormality.   Conus medullaris and cauda equina: Conus extends to the T12-L1 level, partially visible and appears normal. Generally normal cauda equina nerve roots.   Paraspinal and other soft tissues: Negative visible abdominal viscera. Postoperative changes to the posterior lumbar paraspinal soft tissues at L5 and S1 with no postoperative fluid collection or adverse features.   Disc levels:   T12-L1 through L3-L4: Stable and negative aside from mild facet hypertrophy.   L4-L5: Subtle disc desiccation and disc bulging asymmetric to the left. Mild facet hypertrophy. No spinal or lateral recess stenosis. No significant foraminal stenosis despite asymmetric left foraminal disc on series 8 image 18.   L5-S1: Chronic postoperative changes, laminectomy. Underlying disc desiccation. Broad-based left paracentral, lateral recess disc protrusion with evidence of associated annular fissure (series 8, image 23 and series 6, image 8). This appears less pronounced than on July 16, 2023, right lateral recess patency has improved. Mild to moderate residual facet hypertrophy. No significant spinal stenosis now. But there remains severe stenosis at the left lateral recess descending left S1 nerve level (also on image 23). Foraminal involvement by disc and endplate  spurring is primarily on the right side, with stable mild right L5 stenosis on series 5, image 5.   S1-S2: Mildly transitional, otherwise negative.   IMPRESSION: 1. Mildly transitional lumbosacral anatomy with vestigial S1-S2 disc space, the same numbering system used on February MRI. Correlation with radiographs is recommended prior to any operative intervention.   2. Postoperative changes at L5-S1 with ongoing broad-based disc protrusion into the left lateral recess, appears smaller when compared to February, with improved spinal canal and right lateral recess patency there. 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   3. Otherwise stable lumbar spine, mild degeneration elsewhere without stenosis.     Electronically Signed   By: Marlise Simpers M.D.   On: 10/17/2023 08:38  I have personally reviewed the images and agree with the above interpretation.   Pain in her right leg may be from L5-S1 where she had foraminal stenosis, however this is much worse on left side.   Message sent to Dr. Mont Antis about her MRI and will let her know his further recommendations.

## 2023-10-18 NOTE — Telephone Encounter (Signed)
 Reviewed lumbar MRI with Dr. Mont Antis. He recommends EMG of bilateral lower extremities prior to any surgical discussion.   Patient sent message.

## 2023-10-19 ENCOUNTER — Encounter: Admitting: Orthopedic Surgery

## 2023-10-19 ENCOUNTER — Encounter: Payer: Self-pay | Admitting: Neurology

## 2023-10-20 ENCOUNTER — Ambulatory Visit: Admitting: Internal Medicine

## 2023-10-20 ENCOUNTER — Encounter: Admitting: Neurology

## 2023-10-20 ENCOUNTER — Other Ambulatory Visit: Payer: Self-pay

## 2023-10-20 DIAGNOSIS — R202 Paresthesia of skin: Secondary | ICD-10-CM

## 2023-10-21 ENCOUNTER — Encounter: Admitting: Neurology

## 2023-10-22 ENCOUNTER — Ambulatory Visit (INDEPENDENT_AMBULATORY_CARE_PROVIDER_SITE_OTHER): Payer: Self-pay | Admitting: Podiatry

## 2023-10-22 ENCOUNTER — Encounter: Payer: Self-pay | Admitting: Internal Medicine

## 2023-10-22 ENCOUNTER — Encounter: Payer: Self-pay | Admitting: Podiatry

## 2023-10-22 ENCOUNTER — Ambulatory Visit (INDEPENDENT_AMBULATORY_CARE_PROVIDER_SITE_OTHER): Admitting: Internal Medicine

## 2023-10-22 ENCOUNTER — Ambulatory Visit (INDEPENDENT_AMBULATORY_CARE_PROVIDER_SITE_OTHER)

## 2023-10-22 VITALS — Ht 60.0 in | Wt 183.4 lb

## 2023-10-22 VITALS — BP 122/84 | Ht 60.0 in | Wt 183.4 lb

## 2023-10-22 DIAGNOSIS — Z0001 Encounter for general adult medical examination with abnormal findings: Secondary | ICD-10-CM | POA: Diagnosis not present

## 2023-10-22 DIAGNOSIS — M778 Other enthesopathies, not elsewhere classified: Secondary | ICD-10-CM | POA: Diagnosis not present

## 2023-10-22 DIAGNOSIS — F411 Generalized anxiety disorder: Secondary | ICD-10-CM

## 2023-10-22 DIAGNOSIS — Z23 Encounter for immunization: Secondary | ICD-10-CM | POA: Diagnosis not present

## 2023-10-22 DIAGNOSIS — M65971 Unspecified synovitis and tenosynovitis, right ankle and foot: Secondary | ICD-10-CM | POA: Diagnosis not present

## 2023-10-22 DIAGNOSIS — M7752 Other enthesopathy of left foot: Secondary | ICD-10-CM

## 2023-10-22 DIAGNOSIS — R739 Hyperglycemia, unspecified: Secondary | ICD-10-CM | POA: Diagnosis not present

## 2023-10-22 DIAGNOSIS — F410 Panic disorder [episodic paroxysmal anxiety] without agoraphobia: Secondary | ICD-10-CM

## 2023-10-22 DIAGNOSIS — E7800 Pure hypercholesterolemia, unspecified: Secondary | ICD-10-CM

## 2023-10-22 DIAGNOSIS — E66812 Obesity, class 2: Secondary | ICD-10-CM

## 2023-10-22 DIAGNOSIS — Z6836 Body mass index (BMI) 36.0-36.9, adult: Secondary | ICD-10-CM

## 2023-10-22 MED ORDER — BETAMETHASONE SOD PHOS & ACET 6 (3-3) MG/ML IJ SUSP
3.0000 mg | Freq: Once | INTRAMUSCULAR | Status: AC
  Administered 2023-10-22: 3 mg via INTRA_ARTICULAR

## 2023-10-22 MED ORDER — BUSPIRONE HCL 5 MG PO TABS: 5.0000 mg | ORAL_TABLET | Freq: Two times a day (BID) | ORAL | 1 refills | Status: DC | PRN

## 2023-10-22 MED ORDER — ESCITALOPRAM OXALATE 10 MG PO TABS
10.0000 mg | ORAL_TABLET | Freq: Every day | ORAL | 1 refills | Status: DC
Start: 2023-10-22 — End: 2024-03-10

## 2023-10-22 NOTE — Assessment & Plan Note (Signed)
 Deteriorated We will start escitalopram 20 mg daily Will refill buspirone  5 mg twice daily as needed She is not interested in seeing a therapist at this time Support offered

## 2023-10-22 NOTE — Progress Notes (Signed)
 Chief Complaint  Patient presents with   Ankle Pain    Pt is here for bilateral ankle pain she states her left ankle pain is in the heel can't describe the pain but states it bothers her, in pain for about a month,taking OTC medicines for the pain, right ankle she has sprained it many times feels like its bone on bone pain has been on and off for 3 years but constant the lat few months.     Subjective:  23 y.o. female presenting today as a reestablish new patient for evaluation of chronic pain and tenderness associated to the right ankle.  Initiated when she was about [redacted] weeks pregnant when she fell and injured her right ankle.  This was about 1.5 years ago.  She has had pain and tenderness ever since.  She heard an audible pop at that time and was convinced that she broke her ankle however x-rays were negative  Recently over the past month she has been developing left ankle pain as well likely secondary to compensation.   Past Medical History:  Diagnosis Date   Allergy    Anxiety    Asthma    mild; flares up only when sick   Clotting disorder (HCC)    Epiploic appendagitis    Lumbosacral disc herniation    L5-S1   MTHFR mutation    Primary hypertension    Von Willebrand disease (HCC) Dr. Hildy Lowers at [facility]   diagnosed when teenager due to large menstrual blood clots; after seeing hematology 2024, levels did not indicate the disease    Past Surgical History:  Procedure Laterality Date   ESOPHAGOGASTRODUODENOSCOPY (EGD) WITH PROPOFOL  N/A 01/30/2020   Procedure: ESOPHAGOGASTRODUODENOSCOPY (EGD) WITH PROPOFOL ;  Surgeon: Luke Salaam, MD;  Location: [facility] ENDOSCOPY;  Service: Gastroenterology;  Laterality: N/A;   FORAMINOTOMY 1 LEVEL Left 07/12/2023   Procedure: LEFT L5-S1 LAMINOFORAMINOTOMY;  Surgeon: Jodeen Munch, MD;  Location: [facility] ORS;  Service: Neurosurgery;  Laterality: Left;   LUMBAR LAMINECTOMY/DECOMPRESSION MICRODISCECTOMY Right 03/17/2023   Procedure: RIGHT L5-S1  MICRODISCECTOMY;  Surgeon: Jodeen Munch, MD;  Location: [facility] ORS;  Service: Neurosurgery;  Laterality: Right;   SPINE SURGERY     WISDOM TOOTH EXTRACTION  08/2018   no bleeding complications    Allergies  Allergen Reactions   Tylenol  [Acetaminophen ] Other (See Comments)    fever   Depo-Medrol  [Methylprednisolone ] Nausea Only    Feeling flushed    Objective / Physical Exam:  General:  The patient is alert and oriented x3 in no acute distress. Dermatology:  Skin is warm, dry and supple bilateral lower extremities. Negative for open lesions or macerations. Vascular:  Palpable pedal pulses bilaterally. No edema or erythema noted. Capillary refill within normal limits. Neurological:  Grossly intact via light touch Musculoskeletal Exam:  Pain on palpation to the anterior lateral medial aspects of the patient's right ankle.  Mild ankle joint instability noted with positive anterior drawer.. Range of motion within normal limits to all pedal and ankle joints bilateral. Muscle strength 5/5 in all groups bilateral.   Radiographic Exam B/L ankle 10/22/2023:  Normal osseous mineralization. Joint spaces preserved. No fracture/dislocation/boney destruction.  There does appear to be some slight degenerative changes noted to the medial aspect of the tibiotalar joint on the right ankle  Assessment: 1.  Chronic synovitis/capsulitis right ankle 2.  Chronic history of ankle instability x 1.5 years -Compensatory pain left ankle  Plan of Care:  -Patient was evaluated. X-Rays reviewed.  -Injection of 0.5 mL Celestone  Soluspan injected in the patient's right ankle. -Patient has an active prescription for meloxicam  for back pathology.  Continue taking daily -Cam boot dispensed.  WBAT x 4 weeks -Return to clinic 4 weeks   Dot Gazella, DPM [facility]  Dr. Dot Gazella, DPM    [address] [place], [place] [address]                 Office [phone]  Fax [phone]

## 2023-10-22 NOTE — Addendum Note (Signed)
 Addended by: Carollynn Cirri on: 10/22/2023 10:26 AM   Modules accepted: Level of Service

## 2023-10-22 NOTE — Progress Notes (Signed)
 Subjective:    Patient ID: Tamara Mcclain, female    DOB: 2000-05-23, 23 y.o.   MRN: [id number]  HPI  Patient presents to clinic today for her annual exam.  She does feel like she has been having worsening anxiety and panic attacks.  This has culminated over the last few months but seems to be getting worse.  She has not noticed any signs of depression.  She is not currently taking any medications for this but has been prescribed buspirone  and hydroxyzine  in the past.  She is not currently seeing a therapist.  Flu: 01/2023 Tetanus: 06/2022 COVID: Moderna x2 Pap smear: 08/2020 Dentist: biannually  Diet: She does eat meat occasionally. She does not consume fruits or veggies. She does eat fried foods. She drinks mostly water, soda. Exercise: None  Review of Systems     Past Medical History:  Diagnosis Date   Anxiety    Asthma    mild; flares up only when sick   Epiploic appendagitis    Lumbosacral disc herniation    L5-S1   MTHFR mutation    Primary hypertension    Von Willebrand disease (HCC) Dr. Hildy Lowers at [facility]   diagnosed when teenager due to large menstrual blood clots; after seeing hematology 2024, levels did not indicate the disease    Current Outpatient Medications  Medication Sig Dispense Refill   albuterol  (VENTOLIN  HFA) 108 (90 Base) MCG/ACT inhaler Inhale 1-2 puffs into the lungs every 6 (six) hours as needed for wheezing or shortness of breath. 8 g 2   cyclobenzaprine  (FLEXERIL ) 10 MG tablet Take 1 tablet (10 mg total) by mouth 3 (three) times daily as needed for muscle spasms. This can make you sleepy. 60 tablet 0   ibuprofen  (ADVIL ) 200 MG tablet Take 400-600 mg by mouth every 8 (eight) hours as needed (pain.).     levonorgestrel  (MIRENA ) 20 MCG/DAY IUD 1 each by Intrauterine route once.     meloxicam  (MOBIC ) 15 MG tablet Take 1 tablet (15 mg total) by mouth daily. Take with food. 30 tablet 2   oxycodone  (OXY-IR) 5 MG capsule Take 1 capsule (5 mg total) by mouth  every 12 (twelve) hours as needed (severe pain). 10 capsule 0   No current facility-administered medications for this visit.    Allergies  Allergen Reactions   Tylenol  [Acetaminophen ] Other (See Comments)    fever   Depo-Medrol  [Methylprednisolone ] Nausea Only    Feeling flushed    Family History  Problem Relation Age of Onset   Hypertension Father    Pulmonary embolism Father    Cancer Maternal Grandmother 49       breast   Arthritis Maternal Grandmother    Hyperlipidemia Maternal Grandmother    CAD Maternal Grandfather    Cancer Paternal Grandmother        melanoma, sinus cancer   Colon polyps Paternal Grandmother    Heart disease Paternal Grandfather    Gaucher's disease Half-Brother    Esophageal cancer Neg Hx    Stomach cancer Neg Hx    Liver disease Neg Hx     Social History   Socioeconomic History   Marital status: Married    Spouse name: Trella Thurmond.   Number of children: 1   Years of education: 12   Highest education level: 12th grade  Occupational History   Occupation: Lot Production designer, theatre/television/film    Comment: Recruitment consultant [employer]  Tobacco Use   Smoking status: Never    Passive exposure: Past  Smokeless tobacco: Never  Vaping Use   Vaping status: Never Used  Substance and Sexual Activity   Alcohol use: No    Alcohol/week: 0.0 standard drinks of alcohol   Drug use: No   Sexual activity: Yes    Partners: Male    Birth control/protection: I.U.D.  Other Topics Concern   Not on file  Social History Narrative   Not on file   Social Drivers of Health   Financial Resource Strain: Low Risk  (09/13/2023)   Overall Financial Resource Strain (CARDIA)    Difficulty of Paying Living Expenses: Not hard at all  Food Insecurity: No Food Insecurity (09/13/2023)   Hunger Vital Sign    Worried About Running Out of Food in the Last Year: Never true    Ran Out of Food in the Last Year: Never true  Transportation Needs: No Transportation Needs (09/13/2023)   PRAPARE -  Administrator, Civil Service (Medical): No    Lack of Transportation (Non-Medical): No  Physical Activity: Inactive (09/13/2023)   Exercise Vital Sign    Days of Exercise per Week: 0 days    Minutes of Exercise per Session: 20 min  Stress: No Stress Concern Present (09/13/2023)   [employer] of Occupational Health - Occupational Stress Questionnaire    Feeling of Stress : Not at all  Social Connections: Moderately Integrated (09/13/2023)   Social Connection and Isolation Panel    Frequency of Communication with Friends and Family: More than three times a week    Frequency of Social Gatherings with Friends and Family: More than three times a week    Attends Religious Services: 1 to 4 times per year    Active Member of [employer] or Organizations: No    Attends Banker Meetings: Not on file    Marital Status: Married  Catering manager Violence: Not At Risk (08/23/2022)   Humiliation, Afraid, Rape, and Kick questionnaire    Fear of Current or Ex-Partner: No    Emotionally Abused: No    Physically Abused: No    Sexually Abused: No     Constitutional: Denies fever, malaise, fatigue, headache or abrupt weight changes.  HEENT: Denies eye pain, eye redness, ear pain, ringing in the ears, wax buildup, runny nose, nasal congestion, bloody nose, or sore throat. Respiratory: Denies difficulty breathing, shortness of breath, cough or sputum production.   Cardiovascular: Denies chest pain, chest tightness, palpitations or swelling in the hands or feet.  Gastrointestinal: Denies abdominal pain, bloating, constipation, diarrhea or blood in the stool.  GU: Denies urgency, frequency, pain with urination, burning sensation, blood in urine, odor or discharge. Musculoskeletal: Patient reports chronic back pain.  Denies decrease in range of motion, difficulty with gait, muscle pain or joint swelling.  Skin: Denies redness, rashes, lesions or ulcercations.  Neurological: Patient  reports paresthesia bilateral legs.  Denies dizziness, difficulty with memory, difficulty with speech or problems with balance and coordination.  Psych: Patient has a history of anxiety.  Denies depression, SI/HI.  No other specific complaints in a complete review of systems (except as listed in HPI above).  Objective:   Physical Exam  BP 122/84 (BP Location: Right Arm, Patient Position: Sitting, Cuff Size: Normal)   Ht 5' (1.524 m)   Wt 183 lb 6.4 oz (83.2 kg)   LMP  (LMP Unknown)   BMI 35.82 kg/m    Wt Readings from Last 3 Encounters:  10/14/23 181 lb (82.1 kg)  09/17/23 181 lb 9.6 oz (  82.4 kg)  09/15/23 181 lb 12.8 oz (82.5 kg)    General: Appears her stated age, obese, in NAD. Skin: Warm, dry and intact.  HEENT: Head: normal shape and size; Eyes: sclera white, no icterus, conjunctiva pink, PERRLA and EOMs intact;  Neck:  Neck supple, trachea midline. No masses, lumps or thyromegaly present.  Cardiovascular: Normal rate and rhythm. S1,S2 noted.  No murmur, rubs or gallops noted. No JVD or BLE edema.  Pulmonary/Chest: Normal effort and positive vesicular breath sounds. No respiratory distress. No wheezes, rales or ronchi noted.  Abdomen: Soft and nontender. Normal bowel sounds.  Musculoskeletal: Strength 5/5 BUE, 4/5 BLE.  No difficulty with gait.  Neurological: Alert and oriented. Cranial nerves II-XII grossly intact. Coordination normal.  Psychiatric: Mood and affect normal.  Mildly anxious appearing. Judgment and thought content normal.    BMET    Component Value Date/Time   NA 136 07/06/2023 0840   NA 136 01/28/2022 1047   K 4.5 07/06/2023 0840   CL 105 07/06/2023 0840   CO2 25 07/06/2023 0840   GLUCOSE 88 07/06/2023 0840   BUN 8 07/06/2023 0840   BUN 4 (L) 01/28/2022 1047   CREATININE 0.74 07/06/2023 0840   CALCIUM 9.2 07/06/2023 0840   GFRNONAA >60 07/06/2023 0840   GFRAA >60 04/09/2018 2151    Lipid Panel     Component Value Date/Time   CHOL 168  11/27/2021 1428   TRIG 145 11/27/2021 1428   HDL 32 (L) 11/27/2021 1428   CHOLHDL 5.3 (H) 11/27/2021 1428   LDLCALC 110 (H) 11/27/2021 1428    CBC    Component Value Date/Time   WBC 7.4 07/06/2023 0840   RBC 5.37 (H) 07/06/2023 0840   HGB 14.0 07/06/2023 0840   HGB 11.8 06/09/2022 0926   HCT 43.1 07/06/2023 0840   HCT 36.2 06/09/2022 0926   PLT 319 07/06/2023 0840   PLT 324 06/09/2022 0926   MCV 80.3 07/06/2023 0840   MCV 90 06/09/2022 0926   MCH 26.1 07/06/2023 0840   MCHC 32.5 07/06/2023 0840   RDW 13.7 07/06/2023 0840   RDW 13.4 06/09/2022 0926   LYMPHSABS 1.5 06/09/2022 0926   MONOABS 0.6 07/24/2021 1612   EOSABS 0.0 06/09/2022 0926   BASOSABS 0.0 06/09/2022 0926    Hgb A1C Lab Results  Component Value Date   HGBA1C 5.3 01/28/2022            Assessment & Plan:   Preventative Health Maintenance:  Encouraged her to get a flu shot in the fall Tetanus UTD Encouraged her to get a COVID booster Prevnar 20 today Pap smear-she will call and schedule this with her GYN Encouraged her to consume a balanced diet and exercise regimen We will check CBC, c-Met lipid, A1c, today  RTC in 6 months, follow-up chronic conditions Helayne Lo, NP

## 2023-10-22 NOTE — Assessment & Plan Note (Signed)
 Encouraged diet and exercise for weight loss ?

## 2023-10-22 NOTE — Patient Instructions (Signed)

## 2023-10-23 LAB — HEMOGLOBIN A1C
Hgb A1c MFr Bld: 5.3 % (ref <5.7)
Mean Plasma Glucose: 105 mg/dL
eAG (mmol/L): 5.8 mmol/L

## 2023-10-23 LAB — CBC
HCT: 43.2 % (ref 35.0–45.0)
Hemoglobin: 14 g/dL (ref 11.7–15.5)
MCH: 27.5 pg (ref 27.0–33.0)
MCHC: 32.4 g/dL (ref 32.0–36.0)
MCV: 84.7 fL (ref 80.0–100.0)
MPV: 10.3 fL (ref 7.5–12.5)
Platelets: 323 10*3/uL (ref 140–400)
RBC: 5.1 10*6/uL (ref 3.80–5.10)
RDW: 14.4 % (ref 11.0–15.0)
WBC: 8.3 10*3/uL (ref 3.8–10.8)

## 2023-10-23 LAB — COMPREHENSIVE METABOLIC PANEL WITH GFR
AG Ratio: 1.8 (calc) (ref 1.0–2.5)
ALT: 10 U/L (ref 6–29)
AST: 14 U/L (ref 10–30)
Albumin: 4.3 g/dL (ref 3.6–5.1)
Alkaline phosphatase (APISO): 67 U/L (ref 31–125)
BUN: 10 mg/dL (ref 7–25)
CO2: 25 mmol/L (ref 20–32)
Calcium: 9.3 mg/dL (ref 8.6–10.2)
Chloride: 105 mmol/L (ref 98–110)
Creat: 0.66 mg/dL (ref 0.50–0.96)
Globulin: 2.4 g/dL (ref 1.9–3.7)
Glucose, Bld: 94 mg/dL (ref 65–99)
Potassium: 4.4 mmol/L (ref 3.5–5.3)
Sodium: 138 mmol/L (ref 135–146)
Total Bilirubin: 1.5 mg/dL — ABNORMAL HIGH (ref 0.2–1.2)
Total Protein: 6.7 g/dL (ref 6.1–8.1)
eGFR: 126 mL/min/{1.73_m2} (ref >=60)

## 2023-10-23 LAB — LIPID PANEL
Cholesterol: 165 mg/dL (ref <200)
HDL: 36 mg/dL — ABNORMAL LOW (ref >=50)
LDL Cholesterol (Calc): 108 mg/dL — ABNORMAL HIGH
Non-HDL Cholesterol (Calc): 129 mg/dL (ref <130)
Total CHOL/HDL Ratio: 4.6 (calc) (ref <5.0)
Triglycerides: 110 mg/dL (ref <150)

## 2023-10-25 ENCOUNTER — Ambulatory Visit: Payer: Self-pay | Admitting: Internal Medicine

## 2023-10-26 ENCOUNTER — Encounter: Payer: Self-pay | Admitting: Neurosurgery

## 2023-10-26 ENCOUNTER — Ambulatory Visit (INDEPENDENT_AMBULATORY_CARE_PROVIDER_SITE_OTHER): Admitting: Neurosurgery

## 2023-10-26 VITALS — BP 126/88 | Ht 60.0 in | Wt 183.0 lb

## 2023-10-26 DIAGNOSIS — M5416 Radiculopathy, lumbar region: Secondary | ICD-10-CM | POA: Diagnosis not present

## 2023-10-26 DIAGNOSIS — R29898 Other symptoms and signs involving the musculoskeletal system: Secondary | ICD-10-CM | POA: Diagnosis not present

## 2023-10-26 DIAGNOSIS — R2689 Other abnormalities of gait and mobility: Secondary | ICD-10-CM | POA: Diagnosis not present

## 2023-10-26 MED ORDER — GABAPENTIN 100 MG PO CAPS: 100.0000 mg | ORAL_CAPSULE | Freq: Three times a day (TID) | ORAL | 90 refills | Status: DC

## 2023-10-26 NOTE — Progress Notes (Addendum)
 REFERRING PHYSICIAN:  Grayling Leach [address] [place],  [place] [address]  DOS: 07/12/23 Left L5-S1 laminoforaminotomy  DOS: 03/17/2023 (R L5/S1 microdiscectomy)   HISTORY OF PRESENT ILLNESS:  Tamara Mcclain presents with left greater than right leg numbness.  She was doing well when I last saw her.  Over the past several weeks, she developed right sided leg numbness and then began having worse left-sided leg numbness on June 10.  She is having numbness on the front, side, and back of her leg.  It extends down to the top of her foot.  The bottom of her foot feels normal.  She denies weakness.  She is able to feel her private area and has no bowel or bladder concerns.  Her mom reports that she walks abnormally.    PHYSICAL EXAMINATION:  General: Patient is well developed, well nourished, calm, collected, and in no apparent distress.  Awake, alert, oriented to person, place, and time.  Speech is clear and fluent. Fund of knowledge is appropriate.    Cranial Nerves: Pupils equal round and reactive to light.     No posterior cervical tenderness. Mild tenderness in right trapezial region. Mild periscapular tenderness on right > left.   Well healed lumbar incision. No tenderness noted.    No abnormal lesions on exposed skin.    Strength:  Side Iliopsoas Quads Hamstring PF DF EHL  R 5 5 5 5 4 4   L 5 5 5 5 5 5     Reflexes are 2+ and symmetric at the patella and achilles.    Clonus is not present.    Positive SLR on right.    L5 and S1 diminished sensation L versus R   Gait is abnormal.      ROS (Neurologic):  Negative except as noted above  IMAGING: MRI L spine 10/17/2023 IMPRESSION: 1. Mildly transitional lumbosacral anatomy with vestigial S1-S2 disc space, the same numbering system used on February MRI. Correlation with radiographs is recommended prior to any operative intervention.   2. Postoperative changes at L5-S1 with ongoing broad-based disc protrusion into the left  lateral recess, appears smaller when compared to February, with improved spinal canal and right lateral recess patency there. 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   3. Otherwise stable lumbar spine, mild degeneration elsewhere without stenosis.     Electronically Signed   By: Marlise Simpers M.D.   On: 10/17/2023 08:38  ASSESSMENT/PLAN:  Tamara Mcclain has symptoms of possible ongoing radicular dysfunction.  She has had 2 separate microdiscectomies.  Due to her imbalance, I would like to get a thoracic MRI scan.  I would also like to get a nerve conduction study.  I will see her back after that.  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  I have suggested that she go to physical therapy and get a home exercise plan.  I will see her back after her nerve conduction study.  I am hopeful that she will improve.  We did discuss injections, but she is not interested.  If she is not better when I next see her, we will discuss possible interventions.  I have expressed my hesitation on further interventions, as the neck step would include a fusion given her propensity to suffer recurrent disc herniations.  I spent a total of 20 minutes in face-to-face and non-face-to-face activities related to this patient's care today including review of outside records, review of  imaging, review of symptoms, physical exam, discussion of differential diagnosis, discussion of treatment options, and documentation.   Jodeen Munch MD Department of neurosurgery

## 2023-10-28 ENCOUNTER — Telehealth: Payer: Self-pay | Admitting: Neurosurgery

## 2023-10-28 ENCOUNTER — Ambulatory Visit: Admitting: Neurosurgery

## 2023-10-28 MED ORDER — ONDANSETRON HCL 4 MG PO TABS: 4.0000 mg | ORAL_TABLET | Freq: Three times a day (TID) | ORAL | 0 refills | Status: DC | PRN

## 2023-10-28 MED ORDER — METHYLPREDNISOLONE 4 MG PO TBPK: ORAL_TABLET | ORAL | 0 refills | Status: DC

## 2023-10-28 NOTE — Telephone Encounter (Signed)
 Patient called and states she has had bilateral leg numbness that has gotten worse throughout the day. She states that her pain is at a 9/10.

## 2023-10-28 NOTE — Telephone Encounter (Signed)
 Noted an intolerance for methylPREDNISolone  in patient's allergy list. Noted that she has nausea with this medication.  Discussed with Dr. Mont Antis. Ok to put orders in for Zofran  with this medication to help with the nausea.   I called patient to discuss this. She is agreeable to both scripts.   She mentions also that she would like if Dr. Mont Antis could send her a picture via mychart of the stenosis noted on her MRI. She forgot to ask about this at her appointment.   Patient aware to notify us  on Monday if she has persisting symptoms on Monday after the steriods have some time to take affect.

## 2023-10-28 NOTE — Telephone Encounter (Signed)
 I called patient for more information regarding her symptoms.   She states that she has had progressively worsening numbness since waking up this morning that has intensified over the day. She had numbness on her visit with Dr. Mont Antis a few days ago but she states that it is worse since then.  Denies any acute onset weakness, ongoing tingling as noted in last office visit.   She states that she can control bladder/ bowel- denies incontinence/ urinary retention. She mentions some new  numbness in the groin on the left side that started today.   She states that her pain is worse than any pain she has ever experienced. It is worse than prior to either of her back surgeries.  She is taking ibuprofen  which is not controlling pain. Pain control education given and Patient is aware of when to go to the ER if pain is intolerable. She describes her pain as sharp and achy. She has been resting all day but her symptoms persist at rest.   Additional Education given in regards to when to go to the ER such as excruciating pain that cannot be controlled, bowel and bladder changes, numbness in he groin that she cannot feel her wiping her buttocks.   Kendelyn RN discussed with Dr. Mont Antis. He recommends a steroid taper for over the weekend and to go to the ER for any of the symptoms above. Patient will call us  on Monday if she symptoms are not resolved.

## 2023-10-30 ENCOUNTER — Ambulatory Visit

## 2023-10-30 ENCOUNTER — Ambulatory Visit (INDEPENDENT_AMBULATORY_CARE_PROVIDER_SITE_OTHER)

## 2023-10-30 ENCOUNTER — Ambulatory Visit
Admission: RE | Admit: 2023-10-30 | Discharge: 2023-10-30 | Disposition: A | Discharge status: Home / Self Care | Source: Ambulatory Visit | Attending: Emergency Medicine | Admitting: Emergency Medicine

## 2023-10-30 VITALS — BP 126/85 | HR 77 | Temp 98.9°F | Resp 18

## 2023-10-30 DIAGNOSIS — M25532 Pain in left wrist: Secondary | ICD-10-CM

## 2023-10-30 DIAGNOSIS — M25531 Pain in right wrist: Secondary | ICD-10-CM

## 2023-10-30 MED ORDER — PREDNISONE 10 MG (21) PO TBPK
ORAL_TABLET | Freq: Every day | ORAL | 0 refills | Status: DC
Start: 2023-10-30 — End: 2023-12-02

## 2023-10-30 MED ORDER — KETOROLAC TROMETHAMINE 30 MG/ML IJ SOLN
30.0000 mg | Freq: Once | INTRAMUSCULAR | Status: AC
  Administered 2023-10-30: 30 mg via INTRAMUSCULAR

## 2023-10-30 NOTE — Discharge Instructions (Signed)
 Today you were evaluated for your wrist  X-ray is negative  You have been placed in a wrist brace for stability and support, weight wear as needed  You have been given an injection of Toradol  to help reduce inflammation and pain and ideally will start to see improvement within the hour  Starting tomorrow take prednisone  every morning as directed to continue the process above, may take Tylenol  or own pain medicine as needed  May apply ice or heat over the affected area 2 to 15-minute intervals  May elevate to help reduce swelling  If symptoms continue to persist please follow-up with orthopedics informational front page

## 2023-10-30 NOTE — ED Provider Notes (Signed)
 [facility]    CSN: [id number] Arrival date & time: 10/30/23  1032      History   Chief Complaint Chief Complaint  Patient presents with   Wrist Pain    Entered by patient   Hand Pain    HPI Tamara Mcclain is a 23 y.o. female.   Patient presents for evaluation of right wrist pain beginning 1 day ago after injury.  Patient became emotional and slammed the arm multiple times against a countertop and wall.  Now experiencing severe pain rated a 10 out of 10, has limited range of motion to the 4th and 5th finger, endorses tingling to the 4th and 5th finger.  Has attempted use of oxycodone  which allowed for rest and ibuprofen  this morning.  Past Medical History:  Diagnosis Date   Allergy    Anxiety    Asthma    mild; flares up only when sick   Clotting disorder (HCC)    Epiploic appendagitis    Lumbosacral disc herniation    L5-S1   MTHFR mutation    Primary hypertension    Von Willebrand disease (HCC) Dr. Sebastian at [facility]   diagnosed when teenager due to large menstrual blood clots; after seeing hematology 2024, levels did not indicate the disease    Patient Active Problem List   Diagnosis Date Noted   Lumbar radiculopathy 03/17/2023   GAD (generalized anxiety disorder) 10/27/2022   Pure hypercholesterolemia 10/27/2022   Asthma 02/10/2022   High blood pressure 11/10/2021   Class 2 obesity due to excess calories with body mass index (BMI) of 36.0 to 36.9 in adult 11/10/2021   Panic attacks 12/05/2019   Epiploic appendagitis 06/28/2019   Von Willebrand disease (HCC) 08/18/2016    Past Surgical History:  Procedure Laterality Date   ESOPHAGOGASTRODUODENOSCOPY (EGD) WITH PROPOFOL  N/A 01/30/2020   Procedure: ESOPHAGOGASTRODUODENOSCOPY (EGD) WITH PROPOFOL ;  Surgeon: Therisa Bi, MD;  Location[facility];  Service: Gastroenterology;  Laterality: N/A;   FORAMINOTOMY 1 LEVEL Left 07/12/2023   Procedure: LEFT L5-S1 LAMINOFORAMINOTOMY;  Surgeon: Clois Fret, MD;  Location: [facility] ORS;  Service: Neurosurgery;  Laterality: Left;   LUMBAR LAMINECTOMY/DECOMPRESSION MICRODISCECTOMY Right 03/17/2023   Procedure: RIGHT L5-S1 MICRODISCECTOMY;  Surgeon: Clois Fret, MD;  Location: [facility] ORS;  Service: Neurosurgery;  Laterality: Right;   SPINE SURGERY     WISDOM TOOTH EXTRACTION  08/2018   no bleeding complications    OB History     Gravida  3   Para  1   Term  1   Preterm      AB  2   Living  1      SAB  2   IAB      Ectopic      Multiple  0   Live Births  1            Home Medications    Prior to Admission medications   Medication Sig Start Date End Date Taking? Authorizing Provider  predniSONE  (STERAPRED UNI-PAK 21 TAB) 10 MG (21) TBPK tablet Take by mouth daily. Take 6 tabs by mouth daily  for 1 days, then 5 tabs for 1 days, then 4 tabs for 1 days, then 3 tabs for 1 days, 2 tabs for 1 days, then 1 tab by mouth daily for 1 days 10/30/23  Yes Bonney Berres R, NP  albuterol  (VENTOLIN  HFA) 108 (90 Base) MCG/ACT inhaler Inhale 1-2 puffs into the lungs every 6 (six) hours as needed for wheezing or  shortness of breath. 07/09/23   Antonette Angeline ORN, NP  busPIRone  (BUSPAR ) 5 MG tablet Take 1 tablet (5 mg total) by mouth 2 (two) times daily as needed. 10/22/23   Antonette Angeline ORN, NP  cyclobenzaprine  (FLEXERIL ) 10 MG tablet Take 1 tablet (10 mg total) by mouth 3 (three) times daily as needed for muscle spasms. This can make you sleepy. 09/17/23   Hilma Hastings, PA-C  escitalopram  (LEXAPRO ) 10 MG tablet Take 1 tablet (10 mg total) by mouth daily. 10/22/23   Antonette Angeline ORN, NP  gabapentin  (NEURONTIN ) 100 MG capsule Take 1 capsule (100 mg total) by mouth 3 (three) times daily. 10/26/23   Clois Fret, MD  ibuprofen  (ADVIL ) 200 MG tablet Take 400-600 mg by mouth every 8 (eight) hours as needed (pain.).    [provider]  levonorgestrel  (MIRENA ) 20 MCG/DAY IUD 1 each by Intrauterine route once.    [provider]   methylPREDNISolone  (MEDROL  DOSEPAK) 4 MG TBPK tablet Take by mouth daily. -taper daily per package instructions. 10/28/23   Clois Fret, MD  ondansetron  (ZOFRAN ) 4 MG tablet Take 1 tablet (4 mg total) by mouth every 8 (eight) hours as needed for nausea or vomiting. 10/28/23   Clois Fret, MD  oxycodone  (OXY-IR) 5 MG capsule Take 1 capsule (5 mg total) by mouth every 12 (twelve) hours as needed (severe pain). 10/14/23   Hilma Hastings, PA-C    Family History Family History  Problem Relation Age of Onset   Hypertension Father    Pulmonary embolism Father    Anxiety disorder Father    Cancer Maternal Grandmother 79       breast   Arthritis Maternal Grandmother    Hyperlipidemia Maternal Grandmother    CAD Maternal Grandfather    Cancer Paternal Grandmother        melanoma, sinus cancer   Colon polyps Paternal Grandmother    Heart disease Paternal Grandfather    Gaucher's disease Half-Brother    Birth defects Brother    Birth defects Brother    Esophageal cancer Neg Hx    Stomach cancer Neg Hx    Liver disease Neg Hx     Social History Social History   Tobacco Use   Smoking status: Never    Passive exposure: Past   Smokeless tobacco: Never  Vaping Use   Vaping status: Never Used  Substance Use Topics   Alcohol use: No    Alcohol/week: 0.0 standard drinks of alcohol   Drug use: No     Allergies   Tylenol  [acetaminophen ] and Depo-medrol  [methylprednisolone ]   Review of Systems Review of Systems   Physical Exam Triage Vital Signs ED Triage Vitals  Encounter Vitals Group     BP 10/30/23 1135 126/85     Girls Systolic BP Percentile --      Girls Diastolic BP Percentile --      Boys Systolic BP Percentile --      Boys Diastolic BP Percentile --      Pulse Rate 10/30/23 1135 77     Resp 10/30/23 1135 18     Temp 10/30/23 1135 98.9 F (37.2 C)     Temp Source 10/30/23 1135 Oral     SpO2 10/30/23 1135 100 %     Weight --      Height --      Head  Circumference --      Peak Flow --      Pain Score 10/30/23 1139 8  Pain Loc --      Pain Education --      Exclude from Growth Chart --    No data found.  Updated Vital Signs BP 126/85 (BP Location: Left Arm)   Pulse 77   Temp 98.9 F (37.2 C) (Oral)   Resp 18   LMP  (LMP Unknown)   SpO2 100%   Breastfeeding No   Visual Acuity Right Eye Distance:   Left Eye Distance:   Bilateral Distance:    Right Eye Near:   Left Eye Near:    Bilateral Near:     Physical Exam Constitutional:      Appearance: Normal appearance.   Eyes:     Extraocular Movements: Extraocular movements intact.   Pulmonary:     Effort: Pulmonary effort is normal.   Neurological:     Mental Status: She is alert and oriented to person, place, and time. Mental status is at baseline.      UC Treatments / Results  Labs (all labs ordered are listed, but only abnormal results are displayed) Labs Reviewed - No data to display  EKG   Radiology DG Hand Complete Right Result Date: 10/30/2023 CLINICAL DATA:  Right hand/wrist pain for 1 day. Hit hand on counter multiple times and punched wall. Bruising and swelling. EXAM: RIGHT HAND - COMPLETE 3+ VIEW; RIGHT WRIST - COMPLETE 3+ VIEW COMPARISON:  None Available. FINDINGS: There is no evidence of fracture or dislocation. There is no evidence of arthropathy or other focal bone abnormality. Soft tissues are unremarkable. IMPRESSION: Negative. Electronically Signed   By: Norman Gatlin M.D.   On: 10/30/2023 11:45   DG Wrist Complete Right Result Date: 10/30/2023 CLINICAL DATA:  Right hand/wrist pain for 1 day. Hit hand on counter multiple times and punched wall. Bruising and swelling. EXAM: RIGHT HAND - COMPLETE 3+ VIEW; RIGHT WRIST - COMPLETE 3+ VIEW COMPARISON:  None Available. FINDINGS: There is no evidence of fracture or dislocation. There is no evidence of arthropathy or other focal bone abnormality. Soft tissues are unremarkable. IMPRESSION: Negative.  Electronically Signed   By: Norman Gatlin M.D.   On: 10/30/2023 11:45    Procedures Procedures (including critical care time)  Medications Ordered in UC Medications  ketorolac  (TORADOL ) 30 MG/ML injection 30 mg (30 mg Intramuscular Given 10/30/23 1210)    Initial Impression / Assessment and Plan / UC Course  I have reviewed the triage vital signs and the nursing notes.  Pertinent labs & imaging results that were available during my care of the patient were reviewed by me and considered in my medical decision making (see chart for details).  Acute pain of right wrist  X-ray negative, wrist brace applied to be used as needed, Toradol  IM given and prescribed prednisone  for home use, recommended supportive care through [facility] with orthopedic follow-up as needed Final Clinical Impressions(s) / UC Diagnoses   Final diagnoses:  Acute pain of right wrist     Discharge Instructions      Today you were evaluated for your wrist  X-ray is negative  You have been placed in a wrist brace for stability and support, weight wear as needed  You have been given an injection of Toradol  to help reduce inflammation and pain and ideally will start to see improvement within the hour  Starting tomorrow take prednisone  every morning as directed to continue the process above, may take Tylenol  or own pain medicine as needed  May apply ice or heat over the affected  area 2 to 15-minute intervals  May elevate to help reduce swelling  If symptoms continue to persist please follow-up with orthopedics informational front page   ED Prescriptions     Medication Sig Dispense Auth. Provider   predniSONE  (STERAPRED UNI-PAK 21 TAB) 10 MG (21) TBPK tablet Take by mouth daily. Take 6 tabs by mouth daily  for 1 days, then 5 tabs for 1 days, then 4 tabs for 1 days, then 3 tabs for 1 days, 2 tabs for 1 days, then 1 tab by mouth daily for 1 days 21 tablet Felicie Kocher, Shelba SAUNDERS, NP      PDMP not reviewed this  encounter.   Teresa Shelba SAUNDERS, NP 10/30/23 1321

## 2023-10-30 NOTE — ED Triage Notes (Signed)
 Patient reports hit a countertop 6 times with closed fist and hit wall with side of hand last night at 6:30 pm . Patient now complains of pain right hand and right wrist. Rates pain 8/10. Patient took Ibuprofen  600 mg Po at 8:00 am.

## 2023-11-01 ENCOUNTER — Ambulatory Visit
Admission: RE | Admit: 2023-11-01 | Discharge: 2023-11-01 | Disposition: A | Discharge status: Home / Self Care | Source: Ambulatory Visit | Attending: Neurosurgery | Admitting: Neurosurgery

## 2023-11-01 DIAGNOSIS — M5416 Radiculopathy, lumbar region: Secondary | ICD-10-CM

## 2023-11-01 DIAGNOSIS — R29898 Other symptoms and signs involving the musculoskeletal system: Secondary | ICD-10-CM

## 2023-11-01 DIAGNOSIS — R2689 Other abnormalities of gait and mobility: Secondary | ICD-10-CM

## 2023-11-02 ENCOUNTER — Encounter: Payer: Self-pay | Admitting: Neurosurgery

## 2023-11-04 ENCOUNTER — Encounter: Admitting: Neurology

## 2023-11-13 ENCOUNTER — Other Ambulatory Visit: Payer: Self-pay | Admitting: Internal Medicine

## 2023-11-16 NOTE — Telephone Encounter (Signed)
 Requested Prescriptions  Refused Prescriptions Disp Refills   busPIRone  (BUSPAR ) 5 MG tablet [Pharmacy Med Name: BUSPIRONE  HCL 5 MG TABLET] 180 tablet 1    Sig: TAKE 1 TABLET BY MOUTH 2 TIMES DAILY AS NEEDED.     Psychiatry: Anxiolytics/Hypnotics - Non-controlled Passed - 11/16/2023  1:30 PM      Passed - Valid encounter within last 12 months    Recent Outpatient Visits           3 weeks ago Encounter for general adult medical examination with abnormal findings   [facility] [place], Angeline ORN, NP   2 months ago Paresthesia of right upper extremity   [facility] [place], Angeline ORN, [place]

## 2023-11-26 ENCOUNTER — Encounter: Admitting: Neurology

## 2023-11-26 ENCOUNTER — Ambulatory Visit: Admitting: Podiatry

## 2023-11-26 ENCOUNTER — Encounter: Payer: Self-pay | Admitting: Orthopedic Surgery

## 2023-11-26 ENCOUNTER — Ambulatory Visit: Admitting: Neurology

## 2023-11-26 DIAGNOSIS — R202 Paresthesia of skin: Secondary | ICD-10-CM

## 2023-11-26 NOTE — Telephone Encounter (Signed)
 EMG of bilateral lower extremities dated 11/26/23:  NCV & EMG Findings: Electrodiagnostic testing of the right lower extremity and additional studies of the left shows: Bilateral sural and superficial peroneal sensory responses are within normal limits. Bilateral peroneal and tibial motor responses are within normal limits. Bilateral tibial H reflex studies are within normal limits. There is no evidence of active or chronic motor axonal changes affecting any of the tested muscles.  Motor unit configuration and recruitment pattern is within normal limits.   Impression: This is a normal study of the lower extremities.  In particular, there is no evidence of a large fiber sensorimotor polyneuropathy or lumbosacral radiculopathy.       ___________________________ Tonita Blanch, DO    She has f/u with Dr. Clois on 12/09/23.

## 2023-11-26 NOTE — Procedures (Signed)
 [facility] Neurology  [address] [place], [address]  [place], [place] [address] Tel: [phone] Fax: [phone] Test Date:  11/26/2023  Patient: Tamara Mcclain DOB: 2000-06-06 Physician: Tonita Blanch, DO  Sex: Female Height: 5' 0 Ref Phys: Glade Boys, DEVONNA  ID#: [id number]   Technician:    History: This is a 23 year old female with history of lumbar surgery (left L5-S1 laminoforaminotomy and right L5/S1 miscrodisectomy) referred for evaluation of bilateral leg numbness and right foot weakness.  NCV & EMG Findings: Electrodiagnostic testing of the right lower extremity and additional studies of the left shows: Bilateral sural and superficial peroneal sensory responses are within normal limits. Bilateral peroneal and tibial motor responses are within normal limits. Bilateral tibial H reflex studies are within normal limits. There is no evidence of active or chronic motor axonal changes affecting any of the tested muscles.  Motor unit configuration and recruitment pattern is within normal limits.  Impression: This is a normal study of the lower extremities.  In particular, there is no evidence of a large fiber sensorimotor polyneuropathy or lumbosacral radiculopathy.    ___________________________ Tonita Blanch, DO    Nerve Conduction Studies   Stim Site NR Peak (ms) Norm Peak (ms) O-P Amp (V) Norm O-P Amp  Left Sup Peroneal Anti Sensory (Ant Lat Mall)  32 C  12 cm    2.1 <4.4 32.9 >6  Right Sup Peroneal Anti Sensory (Ant Lat Mall)  32 C  12 cm    2.2 <4.4 33.0 >6  Left Sural Anti Sensory (Lat Mall)  32 C  Calf    2.6 <4.4 51.0 >6  Right Sural Anti Sensory (Lat Mall)  32 C  Calf    2.6 <4.4 55.6 >6     Stim Site NR Onset (ms) Norm Onset (ms) O-P Amp (mV) Norm O-P Amp Site1 Site2 Delta-0 (ms) Dist (cm) Vel (m/s) Norm Vel (m/s)  Left Peroneal Motor (Ext Dig Brev)  32 C  Ankle    3.4 <5.5 4.8 >3 B Fib Ankle 6.2 35.0 56 >41  B Fib    9.6  4.4  Poplt B Fib 1.4 8.0 57 >41   Poplt    11.0  4.1         Right Peroneal Motor (Ext Dig Brev)  32 C  Ankle    2.6 <5.5 4.5 >3 B Fib Ankle 6.4 36.0 56 >41  B Fib    9.0  4.3  Poplt B Fib 1.3 8.0 62 >41  Poplt    10.3  4.2         Left Tibial Motor (Abd Hall Brev)  32 C  Ankle    3.3 <5.8 17.1 >8 Knee Ankle 7.6 37.0 49 >41  Knee    10.9  14.6         Right Tibial Motor (Abd Hall Brev)  32 C  Ankle    3.3 <5.8 18.7 >8 Knee Ankle 8.0 37.0 46 >41  Knee    11.3  15.4          Electromyography   Side Muscle Ins.Act Fibs Fasc Recrt Amp Dur Poly Activation Comment  Right AntTibialis Nml Nml Nml Nml Nml Nml Nml Nml N/A  Right Gastroc Nml Nml Nml Nml Nml Nml Nml Nml N/A  Right Flex Dig Long Nml Nml Nml Nml Nml Nml Nml Nml N/A  Right RectFemoris Nml Nml Nml Nml Nml Nml Nml Nml N/A  Right BicepsFemS Nml Nml Nml Nml Nml Nml Nml Nml  N/A  Right GluteusMed Nml Nml Nml Nml Nml Nml Nml Nml N/A  Left AntTibialis Nml Nml Nml Nml Nml Nml Nml Nml N/A  Left Gastroc Nml Nml Nml Nml Nml Nml Nml Nml N/A  Left Flex Dig Long Nml Nml Nml Nml Nml Nml Nml Nml N/A  Left BicepsFemS Nml Nml Nml Nml Nml Nml Nml Nml N/A  Left RectFemoris Nml Nml Nml Nml Nml Nml Nml Nml N/A  Left GluteusMed Nml Nml Nml Nml Nml Nml Nml Nml N/A      Waveforms:

## 2023-12-02 ENCOUNTER — Ambulatory Visit (INDEPENDENT_AMBULATORY_CARE_PROVIDER_SITE_OTHER): Admitting: Neurosurgery

## 2023-12-02 ENCOUNTER — Encounter: Payer: Self-pay | Admitting: Neurosurgery

## 2023-12-02 VITALS — BP 130/84 | Ht 60.0 in | Wt 177.1 lb

## 2023-12-02 DIAGNOSIS — M5416 Radiculopathy, lumbar region: Secondary | ICD-10-CM | POA: Diagnosis not present

## 2023-12-02 DIAGNOSIS — M5126 Other intervertebral disc displacement, lumbar region: Secondary | ICD-10-CM

## 2023-12-02 NOTE — Progress Notes (Signed)
   REFERRING PHYSICIAN:  Antonette Angeline LELON Carrolyn [address] [place],  [place] [address]  DOS: 07/12/23 Left L5-S1 laminoforaminotomy  DOS: 03/17/2023 (R L5/S1 microdiscectomy)   HISTORY OF PRESENT ILLNESS:  12/02/2023 She continues to have left leg pain.  She has had some difficult changes to her living situation.  She has been back at home.  She is not ready to consider any surgical intervention.  She did see physical therapy and is doing home exercises.  10/26/2023 Tamara Mcclain presents with left greater than right leg numbness.  She was doing well when I last saw her.  Over the past several weeks, she developed right sided leg numbness and then began having worse left-sided leg numbness on June 10.  She is having numbness on the front, side, and back of her leg.  It extends down to the top of her foot.  The bottom of her foot feels normal.  She denies weakness.  She is able to feel her private area and has no bowel or bladder concerns.  Her mom reports that she walks abnormally.    PHYSICAL EXAMINATION:  General: Patient is well developed, well nourished, calm, collected, and in no apparent distress.  Awake, alert, oriented to person, place, and time.  Speech is clear and fluent. Fund of knowledge is appropriate.    Cranial Nerves: Pupils equal round and reactive to light.     No posterior cervical tenderness. Mild tenderness in right trapezial region. Mild periscapular tenderness on right > left.   Well healed lumbar incision. No tenderness noted.    No abnormal lesions on exposed skin.    Strength:  Side Iliopsoas Quads Hamstring PF DF EHL  R 5 5 5 5 4 4   L 5 5 5 5 5 5     Reflexes are 2+ and symmetric at the patella and achilles.    Clonus is not present.    Positive SLR on right.    L5 and S1 diminished sensation L versus R   Gait is abnormal.      ROS (Neurologic):  Negative except as noted above  IMAGING: MRI L spine 10/17/2023 IMPRESSION: 1. Mildly transitional  lumbosacral anatomy with vestigial S1-S2 disc space, the same numbering system used on February MRI. Correlation with radiographs is recommended prior to any operative intervention.   2. Postoperative changes at L5-S1 with ongoing broad-based disc protrusion into the left lateral recess, appears smaller when compared to February, with improved spinal canal and right lateral recess patency there. 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   3. Otherwise stable lumbar spine, mild degeneration elsewhere without stenosis.     Electronically Signed   By: VEAR Hurst M.D.   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   She has not improved with physical therapy and home exercises.  She will continue to do these.  Will set her up for an injection.  I will see her back in September.  I spent a total of 10 minutes in face-to-face and non-face-to-face activities related to this patient's care today including review of outside records, review of imaging, review of symptoms, physical exam, discussion of differential diagnosis, discussion of treatment options, and documentation.   Reeves Daisy MD Department of neurosurgery

## 2023-12-06 ENCOUNTER — Ambulatory Visit (INDEPENDENT_AMBULATORY_CARE_PROVIDER_SITE_OTHER): Admitting: Obstetrics & Gynecology

## 2023-12-06 ENCOUNTER — Other Ambulatory Visit (HOSPITAL_COMMUNITY)
Admission: RE | Admit: 2023-12-06 | Discharge: 2023-12-06 | Disposition: A | Discharge status: Home / Self Care | Source: Ambulatory Visit | Attending: Obstetrics & Gynecology | Admitting: Obstetrics & Gynecology

## 2023-12-06 ENCOUNTER — Encounter: Payer: Self-pay | Admitting: Obstetrics & Gynecology

## 2023-12-06 VITALS — BP 127/69 | HR 59 | Ht 60.0 in | Wt 175.2 lb

## 2023-12-06 DIAGNOSIS — Z0000 Encounter for general adult medical examination without abnormal findings: Secondary | ICD-10-CM | POA: Diagnosis present

## 2023-12-06 DIAGNOSIS — Z01419 Encounter for gynecological examination (general) (routine) without abnormal findings: Secondary | ICD-10-CM

## 2023-12-06 DIAGNOSIS — Z124 Encounter for screening for malignant neoplasm of cervix: Secondary | ICD-10-CM | POA: Diagnosis present

## 2023-12-06 NOTE — Progress Notes (Signed)
 GYNECOLOGY ANNUAL PHYSICAL EXAM PROGRESS NOTE  Subjective:    Tamara Mcclain is a 23 y.o. married [phone] (1 yo son) who presents for an annual exam.  The patient is not currently sexually active. The patient participates in regular exercise: no. Has the patient ever been transfused or tattooed?: yes. The patient reports that there is not domestic violence in her life.   The patient has the following complaints today: No problems today. Periods are rare, about every other month. She has a Mirena  in.  Menstrual History: Menarche age: 47 No LMP recorded. (Menstrual status: IUD). Period Pattern: (!) Irregular   Gynecologic History:   History of STI's:  Last Pap: 2022. Results were: normal.       Upstream - 12/06/23 1316       Pregnancy Intention Screening   Does the patient want to become pregnant in the next year? No    Does the patient's partner want to become pregnant in the next year? No    Would the patient like to discuss contraceptive options today? No      Contraception Wrap Up   Current Method IUD or IUS    End Method IUD or IUS    Contraception Counseling Provided No            OB History  Gravida Para Term Preterm AB Living  3 1 1  0 2 1  SAB IAB Ectopic Multiple Live Births  2 0 0 0 1    # Outcome Date GA Lbr Len/2nd Weight Sex Type Anes PTL Lv  3 Term 08/23/22 [redacted]w[redacted]d / 00:34 5 lb 11 oz (2.58 kg) M Vag-Vacuum EPI  LIV     Name: Tamara Mcclain     Apgar1: 8  Apgar5: 9  2 SAB 01/29/21          1 SAB 06/27/20            Past Medical History:  Diagnosis Date   Allergy    Anxiety    Asthma    mild; flares up only when sick   Clotting disorder (HCC)    Epiploic appendagitis    Lumbosacral disc herniation    L5-S1   MTHFR mutation    Primary hypertension    Von Willebrand disease (HCC) Dr. Sebastian at [facility]   diagnosed when teenager due to large menstrual blood clots; after seeing hematology 2024, levels did not indicate the disease     Past Surgical History:  Procedure Laterality Date   ESOPHAGOGASTRODUODENOSCOPY (EGD) WITH PROPOFOL  N/A 01/30/2020   Procedure: ESOPHAGOGASTRODUODENOSCOPY (EGD) WITH PROPOFOL ;  Surgeon: Therisa Bi, MD;  Location: [facility] ENDOSCOPY;  Service: Gastroenterology;  Laterality: N/A;   FORAMINOTOMY 1 LEVEL Left 07/12/2023   Procedure: LEFT L5-S1 LAMINOFORAMINOTOMY;  Surgeon: Clois Fret, MD;  Location: [facility] ORS;  Service: Neurosurgery;  Laterality: Left;   LUMBAR LAMINECTOMY/DECOMPRESSION MICRODISCECTOMY Right 03/17/2023   Procedure: RIGHT L5-S1 MICRODISCECTOMY;  Surgeon: Clois Fret, MD;  Location: [facility] ORS;  Service: Neurosurgery;  Laterality: Right;   SPINE SURGERY     WISDOM TOOTH EXTRACTION  08/2018   no bleeding complications    Family History  Problem Relation Age of Onset   Hypertension Father    Pulmonary embolism Father    Anxiety disorder Father    Cancer Maternal Grandmother 76       breast   Arthritis Maternal Grandmother    Hyperlipidemia Maternal Grandmother    CAD Maternal Grandfather    Cancer Paternal Grandmother  melanoma, sinus cancer   Colon polyps Paternal Grandmother    Heart disease Paternal Grandfather    Gaucher's disease Half-Brother    Birth defects Brother    Birth defects Brother    Esophageal cancer Neg Hx    Stomach cancer Neg Hx    Liver disease Neg Hx     Social History   Socioeconomic History   Marital status: Married    Spouse name: Calleen Alvis.   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  Other Topics Concern   Not on file  Social History Narrative   Not on file   Social Drivers of Health   Financial Resource Strain: Low Risk  (09/13/2023)   Overall Financial Resource Strain (CARDIA)    Difficulty of Paying Living Expenses: Not hard at all  Food Insecurity: No Food Insecurity (09/13/2023)   Hunger Vital Sign    Worried About Running Out of Food in the Last Year: Never true    Ran Out of Food in the Last Year: Never true  Transportation Needs: No Transportation Needs (09/13/2023)   PRAPARE - Administrator, Civil Service (Medical): No    Lack of Transportation (Non-Medical): No  Physical Activity: Inactive (09/13/2023)   Exercise Vital Sign    Days of Exercise per Week: 0 days    Minutes of Exercise per Session: 20 min  Stress: No Stress Concern Present (09/13/2023)   [employer] of Occupational Health - Occupational Stress Questionnaire    Feeling of Stress : Not at all  Social Connections: Moderately Integrated (09/13/2023)   Social Connection and Isolation Panel    Frequency of Communication with Friends and Family: More than three times a week    Frequency of Social Gatherings with Friends and Family: More than three times a week    Attends Religious Services: 1 to 4 times per year    Active Member of [employer] or Organizations: No    Attends Engineer, structural: Not on file    Marital Status: Married  Catering manager Violence: Not At Risk (08/23/2022)   Humiliation, Afraid, Rape, and Kick questionnaire    Fear of Current or Ex-Partner: No    Emotionally Abused: No    Physically Abused: No    Sexually Abused: No    Current Outpatient Medications on File Prior to Visit  Medication Sig Dispense Refill   albuterol  (VENTOLIN  HFA) 108 (90 Base) MCG/ACT inhaler Inhale 1-2 puffs into the lungs every 6 (six) hours as needed for wheezing or shortness of breath. 8 g 2   busPIRone  (BUSPAR ) 5 MG tablet Take 1 tablet (5 mg  total) by mouth 2 (two) times daily as needed. 60 tablet 1   cyclobenzaprine  (FLEXERIL ) 10 MG tablet Take 1 tablet (10 mg total) by mouth 3 (three) times daily as needed for muscle spasms. This can make you sleepy. 60 tablet 0   escitalopram  (LEXAPRO ) 10 MG tablet Take 1  tablet (10 mg total) by mouth daily. 90 tablet 1   ibuprofen  (ADVIL ) 200 MG tablet Take 400-600 mg by mouth every 8 (eight) hours as needed (pain.).     levonorgestrel  (MIRENA ) 20 MCG/DAY IUD 1 each by Intrauterine route once.     ondansetron  (ZOFRAN ) 4 MG tablet Take 1 tablet (4 mg total) by mouth every 8 (eight) hours as needed for nausea or vomiting. 20 tablet 0   oxycodone  (OXY-IR) 5 MG capsule Take 1 capsule (5 mg total) by mouth every 12 (twelve) hours as needed (severe pain). 10 capsule 0   No current facility-administered medications on file prior to visit.    Allergies  Allergen Reactions   Tylenol  [Acetaminophen ] Other (See Comments)    fever   Depo-Medrol  [Methylprednisolone ] Nausea Only    Feeling flushed     Review of Systems Constitutional: negative for chills, fatigue, fevers and sweats Eyes: negative for irritation, redness and visual disturbance Ears, nose, mouth, throat, and face: negative for hearing loss, nasal congestion, snoring and tinnitus Respiratory: negative for asthma, cough, sputum Cardiovascular: negative for chest pain, dyspnea, exertional chest pressure/discomfort, irregular heart beat, palpitations and syncope Gastrointestinal: negative for abdominal pain, change in bowel habits, nausea and vomiting Genitourinary: negative for abnormal menstrual periods, genital lesions, sexual problems and vaginal discharge, dysuria and urinary incontinence Integument/breast: negative for breast lump, breast tenderness and nipple discharge Hematologic/lymphatic: negative for bleeding and easy bruising Musculoskeletal:negative for back pain and muscle weakness Neurological: negative for dizziness,  headaches, vertigo and weakness Endocrine: negative for diabetic symptoms including polydipsia, polyuria and skin dryness Allergic/Immunologic: negative for hay fever and urticaria      Objective:  Blood pressure 127/69, pulse (!) 59, height 5' (1.524 m), weight 175 lb 3.2 oz (79.5 kg), not currently breastfeeding. Body mass index is 34.22 kg/m.    General Appearance:    Alert, cooperative, no distress, appears stated age  Head:    Normocephalic, without obvious abnormality, atraumatic  Eyes:    PERRL, conjunctiva/corneas clear, EOM's intact, both eyes  Ears:    Normal external ear canals, both ears  Nose:   Nares normal, septum midline, mucosa normal, no drainage or sinus tenderness  Throat:   Lips, mucosa, and tongue normal; teeth and gums normal  Neck:   Supple, symmetrical, trachea midline, no adenopathy; thyroid: no enlargement/tenderness/nodules; no carotid bruit or JVD  Back:     Symmetric, no curvature, ROM normal, no CVA tenderness  Lungs:     Clear to auscultation bilaterally, respirations unlabored  Chest Wall:    No tenderness or deformity   Heart:    Regular rate and rhythm, S1 and S2 normal, no murmur, rub or gallop  Breast Exam:    No tenderness, masses, or nipple abnormality  Abdomen:     Soft, non-tender, bowel sounds active all four quadrants, no masses, no organomegaly.    Genitalia:    Pelvic:external genitalia normal, vagina without lesions, discharge, or tenderness, Cervix normal in appearance, no cervical motion tenderness, Mirena  strings visible,  no adnexal masses or tenderness.  Uterus normal size, shape, mobile, regular contours, nontender.  Rectal:    No hemorrhoids appreciated. Internal exam not done.   Extremities:   Extremities normal, atraumatic, no cyanosis or edema  Pulses:   2+ and symmetric all extremities  Skin:   Skin color, texture, turgor normal, no rashes or lesions  Lymph nodes:   Cervical, supraclavicular, and axillary nodes normal  Neurologic:    CNII-XII intact, normal strength, sensation  and reflexes throughout   .  Labs:  Lab Results  Component Value Date   WBC 8.3 10/22/2023   HGB 14.0 10/22/2023   HCT 43.2 10/22/2023   MCV 84.7 10/22/2023   PLT 323 10/22/2023    Lab Results  Component Value Date   CREATININE 0.66 10/22/2023   BUN 10 10/22/2023   NA 138 10/22/2023   K 4.4 10/22/2023   CL 105 10/22/2023   CO2 25 10/22/2023    Lab Results  Component Value Date   ALT 10 10/22/2023   AST 14 10/22/2023   ALKPHOS 89 12/15/2022   BILITOT 1.5 (H) 10/22/2023    Lab Results  Component Value Date   TSH 1.520 06/17/2020     Assessment:   1. Annual physical exam   2. Cervical cancer screening   3. Well woman exam with routine gynecological exam      Plan:  Pap smear done today. Follow up in 1 year for annual exam   Starla Harland BROCKS, MD [facility]

## 2023-12-09 ENCOUNTER — Ambulatory Visit: Admitting: Neurosurgery

## 2023-12-14 LAB — CYTOLOGY - PAP
Diagnosis: NEGATIVE
Diagnosis: REACTIVE

## 2023-12-16 ENCOUNTER — Encounter: Admitting: Neurology

## 2024-01-03 ENCOUNTER — Telehealth: Payer: Self-pay

## 2024-01-03 ENCOUNTER — Telehealth: Payer: Self-pay | Admitting: Orthopedic Surgery

## 2024-01-03 NOTE — Telephone Encounter (Signed)
 Please call her and find out more about her pain.   LBP and left leg pain?  Any new weakness?  Any bowel or bladder issues?   Any new injury?  What is she taking for it?   If she is having any bowel/bladder issues, then she should go to ED.   Please let me know.   Thanks.

## 2024-01-03 NOTE — Telephone Encounter (Signed)
 LBP and left leg pain? LBP radiates to left buttocks down her left leg-pain is present in the back and front of her leg. Any new weakness? no Any bowel or bladder issues? no   Any new injury? no What is she taking for it? Ibuprofen  200 mg-took 4 of the 200 mg tablets this morning and it did not ease off the pain at all.  Started with LBP when she woke up Saturday 01/01/24, it was mild, it kept getting and by Sunday morning pain was excruciating and radiating down her left leg. Any movement makes pain intense. She gets some relief with laying on her right side. Her pain was so intense this morning she was nauseas but did not throw up. So far no vomiting or syncope. Noticed some off balance issue in the last 2 days with walking that lasts a minute or 2 and happens off and on not often. Advised patient if symptoms gets worse, she is not able to feel her legs or walk or move at all, starts to throw up from pain or any other new symptoms weakness to go to ER

## 2024-01-03 NOTE — Telephone Encounter (Signed)
 No red flag symptoms noted. She is scheduled to be seen in office tomorrow.

## 2024-01-03 NOTE — Telephone Encounter (Signed)
 I received the following secure chat from [place] (front desk) at 2:19pm today.     Per discussion with Glade Boys, PA-C, she was advised to try OTC medications and ice/heat. She was also offered an appointment with our office for tomorrow.

## 2024-01-04 ENCOUNTER — Other Ambulatory Visit: Payer: Self-pay

## 2024-01-04 ENCOUNTER — Encounter: Payer: Self-pay | Admitting: Neurosurgery

## 2024-01-04 ENCOUNTER — Ambulatory Visit (INDEPENDENT_AMBULATORY_CARE_PROVIDER_SITE_OTHER): Admitting: Neurosurgery

## 2024-01-04 VITALS — BP 122/78 | Ht 60.0 in | Wt 175.0 lb

## 2024-01-04 DIAGNOSIS — M5416 Radiculopathy, lumbar region: Secondary | ICD-10-CM

## 2024-01-04 DIAGNOSIS — M5116 Intervertebral disc disorders with radiculopathy, lumbar region: Secondary | ICD-10-CM | POA: Diagnosis not present

## 2024-01-04 DIAGNOSIS — M5126 Other intervertebral disc displacement, lumbar region: Secondary | ICD-10-CM

## 2024-01-04 MED ORDER — GABAPENTIN 300 MG PO CAPS
300.0000 mg | ORAL_CAPSULE | Freq: Three times a day (TID) | ORAL | 0 refills | Status: DC
  Filled 2024-01-04: qty 90, 30d supply, fill #0

## 2024-01-04 MED ORDER — METHOCARBAMOL 500 MG PO TABS
500.0000 mg | ORAL_TABLET | Freq: Four times a day (QID) | ORAL | 0 refills | Status: AC | PRN
  Filled 2024-01-04: qty 120, 30d supply, fill #0

## 2024-01-04 NOTE — Progress Notes (Addendum)
 REFERRING PHYSICIAN:  Antonette Angeline LELON Carrolyn [address] [place],  [place] [address]  DOS: 07/12/23 Left L5-S1 laminoforaminotomy  DOS: 03/17/2023 (R L5/S1 microdiscectomy)   HISTORY OF PRESENT ILLNESS:  01/04/2024 She has been to physical therapy 1 time.  It did not help.  She has been doing exercises at home.  She started a new job recently.  12/02/2023 She continues to have left leg pain.  She has had some difficult changes to her living situation.  She has been back at home.  She is not ready to consider any surgical intervention.  She did see physical therapy and is doing home exercises.  10/26/2023 Ms. Seel presents with left greater than right leg numbness.  She was doing well when I last saw her.  Over the past several weeks, she developed right sided leg numbness and then began having worse left-sided leg numbness on June 10.  She is having numbness on the front, side, and back of her leg.  It extends down to the top of her foot.  The bottom of her foot feels normal.  She denies weakness.  She is able to feel her private area and has no bowel or bladder concerns.  Her mom reports that she walks abnormally.    PHYSICAL EXAMINATION:  General: Patient is well developed, well nourished, calm, collected, and in no apparent distress.  Awake, alert, oriented to person, place, and time.  Speech is clear and fluent. Fund of knowledge is appropriate.    Cranial Nerves: Pupils equal round and reactive to light.     No posterior cervical tenderness. Mild tenderness in right trapezial region. Mild periscapular tenderness on right > left.   Well healed lumbar incision. No tenderness noted.    No abnormal lesions on exposed skin.    Strength:  Side Iliopsoas Quads Hamstring PF DF EHL  R 5 5 5 5 4 4   L 5 5 5 5 5 5     Reflexes are 2+ and symmetric at the patella and achilles.    Clonus is not present.    Positive SLR on right.    L5 and S1 diminished sensation L versus R   Gait is  abnormal.      ROS (Neurologic):  Negative except as noted above  IMAGING: MRI L spine 10/17/2023 IMPRESSION: 1. Mildly transitional lumbosacral anatomy with vestigial S1-S2 disc space, the same numbering system used on February MRI. Correlation with radiographs is recommended prior to any operative intervention.   2. Postoperative changes at L5-S1 with ongoing broad-based disc protrusion into the left lateral recess, appears smaller when compared to February, with improved spinal canal and right lateral recess patency there. 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   3. Otherwise stable lumbar spine, mild degeneration elsewhere without stenosis.     Electronically Signed   By: VEAR Hurst M.D.   On: 10/17/2023 08:38    ASSESSMENT/PLAN:  Jozey Ellen Pillay has symptoms of ongoing radicular dysfunction.  She has had a second reherniation.  She is only going to 1 visit a physical therapy.  Have asked her to return for reevaluation.  If she does not improve, we will discuss a transforaminal lumbar interbody fusion.  She is currently parenting her 69-year-old son without assistance.  I have discussed starting to work on plans for how she might handle the postoperative period should we need to take that step.  She will continue NSAIDs for now.  She will start gabapentin  300 mg at nighttime and then increase her dose as tolerated.  I have recommended starting the initial dose for 1 week before any changes.  Additionally I will send in some muscle relaxants as needed.  I spent a total of 10 minutes in face-to-face and non-face-to-face activities related to this patient's care today including review of outside records, review of imaging, review of symptoms, physical exam, discussion of differential diagnosis, discussion of treatment options, and documentation.   Reeves Daisy MD Department of neurosurgery

## 2024-01-17 DIAGNOSIS — M6281 Muscle weakness (generalized): Secondary | ICD-10-CM | POA: Diagnosis not present

## 2024-01-17 DIAGNOSIS — M5416 Radiculopathy, lumbar region: Secondary | ICD-10-CM | POA: Diagnosis not present

## 2024-01-20 ENCOUNTER — Other Ambulatory Visit: Payer: Self-pay

## 2024-01-20 ENCOUNTER — Encounter: Payer: Self-pay | Admitting: Internal Medicine

## 2024-01-20 ENCOUNTER — Ambulatory Visit (INDEPENDENT_AMBULATORY_CARE_PROVIDER_SITE_OTHER): Admitting: Internal Medicine

## 2024-01-20 VITALS — BP 124/78 | Ht 60.0 in | Wt 174.2 lb

## 2024-01-20 DIAGNOSIS — E66811 Obesity, class 1: Secondary | ICD-10-CM | POA: Diagnosis not present

## 2024-01-20 DIAGNOSIS — K6389 Other specified diseases of intestine: Secondary | ICD-10-CM

## 2024-01-20 DIAGNOSIS — F439 Reaction to severe stress, unspecified: Secondary | ICD-10-CM | POA: Diagnosis not present

## 2024-01-20 DIAGNOSIS — Z6834 Body mass index (BMI) 34.0-34.9, adult: Secondary | ICD-10-CM | POA: Diagnosis not present

## 2024-01-20 DIAGNOSIS — G44209 Tension-type headache, unspecified, not intractable: Secondary | ICD-10-CM

## 2024-01-20 DIAGNOSIS — E6609 Other obesity due to excess calories: Secondary | ICD-10-CM

## 2024-01-20 MED ORDER — ONDANSETRON 4 MG PO TBDP
4.0000 mg | ORAL_TABLET | Freq: Three times a day (TID) | ORAL | 0 refills | Status: DC | PRN
  Filled 2024-01-20: qty 18, 21d supply, fill #0

## 2024-01-20 NOTE — Patient Instructions (Signed)
 Managing Stress, Adult Feeling a certain amount of stress is normal. Stress helps our body and mind get ready to deal with the demands of life. Stress hormones can motivate you to do well at work and meet your responsibilities. But severe or long-term (chronic) stress can affect your mental and physical health. Chronic stress puts you at higher risk for: Anxiety and depression. Other health problems such as digestive problems, muscle aches, heart disease, high blood pressure, and stroke. What are the causes? Common causes of stress include: Demands from work, such as deadlines, feeling overworked, or having long hours. Pressures at home, such as money issues, disagreements with a spouse, or parenting issues. Pressures from major life changes, such as divorce, moving, loss of a loved one, or chronic illness. You may be at higher risk for stress-related problems if you: Do not get enough sleep. Are in poor health. Do not have emotional support. Have a mental health disorder such as anxiety or depression. How to recognize stress Stress can make you: Have trouble sleeping. Feel sad, anxious, irritable, or overwhelmed. Lose your appetite. Overeat or want to eat unhealthy foods. Want to use drugs or alcohol. Stress can also cause physical symptoms, such as: Sore, tense muscles, especially in the shoulders and neck. Headaches. Trouble breathing. A faster heart rate. Stomach pain, nausea, or vomiting. Diarrhea or constipation. Trouble concentrating. Follow these instructions at home: Eating and drinking Eat a healthy diet. This includes: Eating foods that are high in fiber, such as beans, whole grains, and fresh fruits and vegetables. Limiting foods that are high in fat and processed sugars, such as fried or sweet foods. Do not skip meals or overeat. Drink enough fluid to keep your urine pale yellow. Alcohol use Do not drink alcohol if: Your health care provider tells you not to  drink. You are pregnant, may be pregnant, or are planning to become pregnant. Drinking alcohol is a way some people try to ease their stress. This can be dangerous, so if you drink alcohol: Limit how much you have to: 0-1 drink a day for women. 0-2 drinks a day for men. Know how much alcohol is in your drink. In the [place] one drink equals one 12 oz bottle of beer (355 mL), one 5 oz glass of wine (148 mL), or one 1 oz glass of hard liquor (44 mL). Activity  Include 30 minutes of exercise in your daily schedule. Exercise is a good stress reducer. Include time in your day for an activity that you find relaxing. Try taking a walk, going on a bike ride, reading a book, or listening to music. Schedule your time in a way that lowers stress, and keep a regular schedule. Focus on doing what is most important to get done. Lifestyle Identify the source of your stress and your reaction to it. See a therapist who can help you change unhelpful reactions. When there are stressful events: Talk about them with family, friends, or coworkers. Try to think realistically about stressful events and not ignore them or overreact. Try to find the positives in a stressful situation and not focus on the negatives. Cut back on responsibilities at work and home, if possible. Ask for help from friends or family members if you need it. Find ways to manage stress, such as: Mindfulness, meditation, or deep breathing. Yoga or tai chi. Progressive muscle relaxation. Spending time in nature. Doing art, playing music, or reading. Making time for fun activities. Spending time with family and friends. Get support  from family, friends, or spiritual resources. General instructions Get enough sleep. Try to go to sleep and get up at about the same time every day. Take over-the-counter and prescription medicines only as told by your health care provider. Do not use any products that contain nicotine or tobacco. These products  include cigarettes, chewing tobacco, and vaping devices, such as e-cigarettes. If you need help quitting, ask your health care provider. Do not use drugs or smoke to deal with stress. Keep all follow-up visits. This is important. Where to find support Talk with your health care provider about stress management or finding a support group. Find a therapist to work with you on your stress management techniques. Where to find more information [employer] on Mental Illness: www.nami.org American Psychological Association: [employer].ca Contact a health care provider if: Your stress symptoms get worse. You are unable to manage your stress at home. You are struggling to stop using drugs or alcohol. Get help right away if: You may be a danger to yourself or others. You have any thoughts of death or suicide. Get help right awayif you feel like you may hurt yourself or others, or have thoughts about taking your own life. Go to your nearest emergency room or: Call 911. Call the National Suicide Prevention Lifeline at (304)678-3299 or 988 in the [place] This is open 24 hours a day. If you're a Veteran: Call 988 and press 1. This is open 24 hours a day. Text the [employer] at 226-132-3352. Summary Feeling a certain amount of stress is normal, but severe or long-term (chronic) stress can affect your mental and physical health. Chronic stress can put you at higher risk for anxiety, depression, and other health problems such as digestive problems, muscle aches, heart disease, high blood pressure, and stroke. You may be at higher risk for stress-related problems if you do not get enough sleep, are in poor health, lack emotional support, or have a mental health disorder such as anxiety or depression. Identify the source of your stress and your reaction to it. Try talking about stressful events with family, friends, or coworkers, finding a coping method, or getting support from spiritual resources. If  you need more help, talk with your health care provider about finding a support group or a mental health therapist. This information is not intended to replace advice given to you by your health care provider. Make sure you discuss any questions you have with your health care provider. Document Revised: 12/10/2022 Document Reviewed: 11/19/2020 Elsevier Patient Education  2024 [employer].

## 2024-01-20 NOTE — Progress Notes (Signed)
 Subjective:    Patient ID: Tamara Mcclain, female    DOB: 08-13-2000, 23 y.o.   MRN: [id number]  HPI  Discussed the use of AI scribe software for clinical note transcription with the patient, who gave verbal consent to proceed.   Tamara Mcclain is a 24 year old female who presents with nausea and right-sided abdominal pain.  She experienced sudden nausea at work yesterday, accompanied by a sensation of needing to vomit, though she did not actually vomit. She describes a sharp, noticeable pain on her right side, similar to previous episodes of epiploic appendagitis. The pain began this morning and is eased slightly by taking ibuprofen , two pills twice a day. No heartburn or reflux is present. She denies constipation, diarrhea or blood in her stool. She is not currently under the care of a gastroenterologist.  She also experiences headaches, described as pressure or throbbing,  mainly in her forehead. They seem worse particularly when staring at a computer screen or moving a lot at work. The headaches are more severe during the day and less so on weekends. She wears prescription glasses and last visited an eye doctor earlier this year.  She takes ibuprofen  OTC and methocarbamol  with some relief of symptoms.  She is under significant stress due to her husband's recent incarceration, leaving her as the sole provider for her child. She has moved into her father's house in [place] after a difficult living situation with her father's wife. 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     She is also concerned about her weight and would like to explore her options for weight loss medication.  Her current weight is 174 pounds with a BMI of 34.02.  She reports she is only eating 1-2 meals a day and has been trying to eat healthier.  She does get some walking in but does not exercise routinely.  Review of Systems     Past Medical  History:  Diagnosis Date   Allergy    Anxiety    Asthma    mild; flares up only when sick   Clotting disorder (HCC)    Epiploic appendagitis    Lumbosacral disc herniation    L5-S1   MTHFR mutation    Primary hypertension    Von Willebrand disease (HCC) Dr. Sebastian at [facility]   diagnosed when teenager due to large menstrual blood clots; after seeing hematology 2024, levels did not indicate the disease    Current Outpatient Medications  Medication Sig Dispense Refill   albuterol  (VENTOLIN  HFA) 108 (90 Base) MCG/ACT inhaler Inhale 1-2 puffs into the lungs every 6 (six) hours as needed for wheezing or shortness of breath. 8 g 2   busPIRone  (BUSPAR ) 5 MG tablet Take 1 tablet (5 mg total) by mouth 2 (two) times daily as needed. 60 tablet 1   escitalopram  (LEXAPRO ) 10 MG tablet Take 1 tablet (10 mg total) by mouth daily. 90 tablet 1   gabapentin  (NEURONTIN ) 300 MG capsule Take 1 capsule (300 mg total) by mouth 3 (three) times daily. Start 1 pill at night time for 1 week, then add a morning dose for a week, then go to 3 times per day 90 capsule 0   ibuprofen  (ADVIL ) 200 MG tablet Take 400-600 mg by mouth every 8 (eight) hours as needed (pain.).     levonorgestrel  (MIRENA ) 20 MCG/DAY IUD 1 each by Intrauterine route once.  methocarbamol  (ROBAXIN ) 500 MG tablet Take 1 tablet (500 mg total) by mouth every 6 (six) hours as needed for muscle spasms. 120 tablet 0   ondansetron  (ZOFRAN ) 4 MG tablet Take 1 tablet (4 mg total) by mouth every 8 (eight) hours as needed for nausea or vomiting. 20 tablet 0   No current facility-administered medications for this visit.    Allergies  Allergen Reactions   Tylenol  [Acetaminophen ] Other (See Comments)    fever   Depo-Medrol  [Methylprednisolone ] Nausea Only    Feeling flushed    Family History  Problem Relation Age of Onset   Hypertension Father    Pulmonary embolism Father    Anxiety disorder Father    Cancer Maternal Grandmother 34       breast    Arthritis Maternal Grandmother    Hyperlipidemia Maternal Grandmother    CAD Maternal Grandfather    Cancer Paternal Grandmother        melanoma, sinus cancer   Colon polyps Paternal Grandmother    Heart disease Paternal Grandfather    Gaucher's disease Half-Brother    Birth defects Brother    Birth defects Brother    Esophageal cancer Neg Hx    Stomach cancer Neg Hx    Liver disease Neg Hx     Social History   Socioeconomic History   Marital status: Married    Spouse name: Icela Glymph.   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  Other Topics Concern   Not on file  Social History Narrative   Not on file   Social Drivers of Health   Financial Resource Strain: Low Risk  (01/19/2024)   Overall Financial Resource Strain (CARDIA)    Difficulty of Paying Living Expenses: Not hard at all  Food Insecurity: No Food Insecurity (01/19/2024)   Hunger Vital Sign    Worried About Running Out of Food in the Last Year: Never true    Ran Out of Food in the Last Year: Never true  Transportation Needs: No Transportation Needs (01/19/2024)   PRAPARE - Administrator, Civil Service (Medical): No    Lack of Transportation (Non-Medical): No  Physical Activity: Inactive (01/19/2024)   Exercise Vital Sign    Days of Exercise per Week: 0 days    Minutes of Exercise per Session: Not on file  Stress: No Stress Concern Present (01/19/2024)   [employer] of Occupational Health - Occupational Stress Questionnaire     Feeling of Stress: Only a little  Social Connections: Moderately Integrated (01/19/2024)   Social Connection and Isolation Panel    Frequency of Communication with Friends and Family: More than three times a week    Frequency of Social Gatherings with Friends and Family: Twice a week    Attends Religious Services: 1 to 4 times per year    Active Member of [employer] or Organizations: No    Attends Banker Meetings: Not on file  Marital Status: Married  Catering manager Violence: Not At Risk (08/23/2022)   Humiliation, Afraid, Rape, and Kick questionnaire    Fear of Current or Ex-Partner: No    Emotionally Abused: No    Physically Abused: No    Sexually Abused: No     Constitutional: Patient reports headaches.  Denies fever, malaise, fatigue, or abrupt weight changes.  HEENT: Denies eye pain, eye redness, ear pain, ringing in the ears, wax buildup, runny nose, nasal congestion, bloody nose, or sore throat. Respiratory: Denies difficulty breathing, shortness of breath, cough or sputum production.   Cardiovascular: Denies chest pain, chest tightness, palpitations or swelling in the hands or feet.  Gastrointestinal: Patient reports intermittent nausea, right side abdominal pain.  Denies bloating, constipation, diarrhea or blood in the stool.  GU: Denies urgency, frequency, pain with urination, burning sensation, blood in urine, odor or discharge. Musculoskeletal: Patient reports chronic back pain.  Denies decrease in range of motion, difficulty with gait, muscle pain or joint swelling.  Skin: Denies redness, rashes, lesions or ulcercations.  Neurological: Denies dizziness, difficulty with memory, difficulty with speech or problems with balance and coordination.  Psych: Patient has a history of anxiety, reports recent increase in stress.  Denies depression, SI/HI.  No other specific complaints in a complete review of systems (except as listed in HPI above).  Objective:   Physical  Exam  BP 124/78 (BP Location: Left Arm, Patient Position: Sitting, Cuff Size: Normal)   Ht 5' (1.524 m)   Wt 174 lb 3.2 oz (79 kg)   LMP 12/22/2023 (Approximate)   BMI 34.02 kg/m     Wt Readings from Last 3 Encounters:  01/04/24 175 lb (79.4 kg)  12/06/23 175 lb 3.2 oz (79.5 kg)  12/02/23 177 lb 2 oz (80.3 kg)    General: Appears her stated age, obese, in NAD. Skin: Warm, dry and intact.  HEENT: Head: normal shape and size; Eyes: sclera white, no icterus, conjunctiva pink, PERRLA and EOMs intact;  Cardiovascular: Normal rate and rhythm.  Pulmonary/Chest: Normal effort and positive vesicular breath sounds. No respiratory distress. No wheezes, rales or ronchi noted.  Abdomen: Soft and tender in the RUQ.  Normal bowel sounds. Musculoskeletal:  No difficulty with gait.  Neurological: Alert and oriented. Coordination normal.  Psychiatric: Mood and affect normal.  Mildly anxious appearing. Judgment and thought content normal.    BMET    Component Value Date/Time   NA 138 10/22/2023 0926   NA 136 01/28/2022 1047   K 4.4 10/22/2023 0926   CL 105 10/22/2023 0926   CO2 25 10/22/2023 0926   GLUCOSE 94 10/22/2023 0926   BUN 10 10/22/2023 0926   BUN 4 (L) 01/28/2022 1047   CREATININE 0.66 10/22/2023 0926   CALCIUM 9.3 10/22/2023 0926   GFRNONAA >60 07/06/2023 0840   GFRAA >60 04/09/2018 2151    Lipid Panel     Component Value Date/Time   CHOL 165 10/22/2023 0926   TRIG 110 10/22/2023 0926   HDL 36 (L) 10/22/2023 0926   CHOLHDL 4.6 10/22/2023 0926   LDLCALC 108 (H) 10/22/2023 0926    CBC    Component Value Date/Time   WBC 8.3 10/22/2023 0926   RBC 5.10 10/22/2023 0926   HGB 14.0 10/22/2023 0926   HGB 11.8 06/09/2022 0926   HCT 43.2 10/22/2023 0926   HCT 36.2 06/09/2022 0926   PLT 323 10/22/2023 0926   PLT 324 06/09/2022 0926   MCV 84.7 10/22/2023 0926   MCV 90 06/09/2022  0926   MCH 27.5 10/22/2023 0926   MCHC 32.4 10/22/2023 0926   RDW 14.4 10/22/2023 0926    RDW 13.4 06/09/2022 0926   LYMPHSABS 1.5 06/09/2022 0926   MONOABS 0.6 07/24/2021 1612   EOSABS 0.0 06/09/2022 0926   BASOSABS 0.0 06/09/2022 0926    Hgb A1C Lab Results  Component Value Date   HGBA1C 5.3 10/22/2023            Assessment & Plan:    Assessment and Plan    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 Symptoms resolve with anti-inflammatory treatment. - Continue ibuprofen , 2 pills twice a day-consume with food. - Prescribe Zofran  4 mg every 8 hours as needed for nausea. - Offer referral to GI for a second opinion if desired.  Headache, likely tension-type Headaches likely tension-type, exacerbated by stress and screen exposure. Current glasses may lack blue light protection. - Consider seeing the eye doctor sooner if headaches persist. - Inquire with employer about blue light reducing cover for computer. - Continue using ibuprofen  and methocarbamol  as previously prescribed. - Ensure adequate hydration and sleep.  Psychosocial stressors due to family disruption and increased responsibilities Significant stress due to husband's incarceration and increased responsibilities. Living situation stabilized. - Acknowledge stress and encourage stress reduction techniques.  Overweight Desire for weight loss discussed. Insurance does not cover weight loss medications unless comorbid conditions are present. Recent weight loss likely due to stress and reduced food intake. - Encourage consistent eating of three meals a day, focusing on meat and vegetables. - Advise cutting calories and carbohydrates for weight loss.       RTC in 3 months, follow-up chronic conditions Angeline Laura, NP

## 2024-01-23 DIAGNOSIS — Z1331 Encounter for screening for depression: Secondary | ICD-10-CM | POA: Diagnosis not present

## 2024-01-23 DIAGNOSIS — J029 Acute pharyngitis, unspecified: Secondary | ICD-10-CM | POA: Diagnosis not present

## 2024-01-26 ENCOUNTER — Encounter: Payer: Self-pay | Admitting: Internal Medicine

## 2024-01-26 ENCOUNTER — Other Ambulatory Visit: Payer: Self-pay

## 2024-01-26 ENCOUNTER — Ambulatory Visit (INDEPENDENT_AMBULATORY_CARE_PROVIDER_SITE_OTHER): Admitting: Internal Medicine

## 2024-01-26 VITALS — BP 128/84 | Temp 97.5°F | Ht 60.0 in | Wt 171.6 lb

## 2024-01-26 DIAGNOSIS — J020 Streptococcal pharyngitis: Secondary | ICD-10-CM

## 2024-01-26 LAB — POCT RAPID STREP A (OFFICE): Rapid Strep A Screen: POSITIVE — AB

## 2024-01-26 MED ORDER — AMOXICILLIN 500 MG PO CAPS: 500.0000 mg | ORAL_CAPSULE | Freq: Two times a day (BID) | ORAL | 0 refills | Status: DC

## 2024-01-26 MED ORDER — AMOXICILLIN 500 MG PO CAPS
500.0000 mg | ORAL_CAPSULE | Freq: Two times a day (BID) | ORAL | 0 refills | Status: DC
  Filled 2024-01-26: qty 20, 10d supply, fill #0

## 2024-01-26 NOTE — Progress Notes (Signed)
 Subjective:    Patient ID: Tamara Mcclain, female    DOB: 10/23/2000, 23 y.o.   MRN: [id number]  HPI  Discussed the use of AI scribe software for clinical note transcription with the patient, who gave verbal consent to proceed.  Tamara Mcclain is a 23 year old female who presents with a sore throat.  She has been experiencing a sore throat that began suddenly on Saturday evening around 5:30 to 6:00 PM. By Sunday, the pain had intensified, leading her to have a virtual appointment with a provider at [facility], who suspected strep throat. However, she did not believe it was strep as she had experienced it before and the symptoms were not as severe.  Currently, the sore throat is less painful than on Sunday, but it still causes discomfort when she eats or drinks. She manages the pain with ibuprofen  at night, as symptoms worsen during that time, and uses Mucinex sore throat lozenges during the day. She has also been gargling with salt water. No fever, chills, or body aches are present.   She has not had sick contacts that she is aware of.     Review of Systems     Past Medical History:  Diagnosis Date   Allergy    Anxiety    Asthma    mild; flares up only when sick   Clotting disorder (HCC)    Epiploic appendagitis    Lumbosacral disc herniation    L5-S1   MTHFR mutation    Primary hypertension    Von Willebrand disease (HCC) Dr. Sebastian at [facility]   diagnosed when teenager due to large menstrual blood clots; after seeing hematology 2024, levels did not indicate the disease    Current Outpatient Medications  Medication Sig Dispense Refill   albuterol  (VENTOLIN  HFA) 108 (90 Base) MCG/ACT inhaler Inhale 1-2 puffs into the lungs every 6 (six) hours as needed for wheezing or shortness of breath. 8 g 2   busPIRone  (BUSPAR ) 5 MG tablet Take 1 tablet (5 mg total) by mouth 2 (two) times daily as needed. 60 tablet 1   escitalopram  (LEXAPRO ) 10 MG tablet Take 1 tablet (10 mg total)  by mouth daily. 90 tablet 1   gabapentin  (NEURONTIN ) 300 MG capsule Take 1 capsule (300 mg total) by mouth 3 (three) times daily. Start 1 pill at night time for 1 week, then add a morning dose for a week, then go to 3 times per day (Patient taking differently: Take 300 mg by mouth as needed. Start 1 pill at night time for 1 week, then add a morning dose for a week, then go to 3 times per day) 90 capsule 0   ibuprofen  (ADVIL ) 200 MG tablet Take 400-600 mg by mouth every 8 (eight) hours as needed (pain.).     levonorgestrel  (MIRENA ) 20 MCG/DAY IUD 1 each by Intrauterine route once.     methocarbamol  (ROBAXIN ) 500 MG tablet Take 1 tablet (500 mg total) by mouth every 6 (six) hours as needed for muscle spasms. 120 tablet 0   ondansetron  (ZOFRAN -ODT) 4 MG disintegrating tablet Take 1 tablet (4 mg total) by mouth every 8 (eight) hours as needed for nausea or vomiting. 30 tablet 0   No current facility-administered medications for this visit.    Allergies  Allergen Reactions   Tylenol  [Acetaminophen ] Other (See Comments)    fever   Depo-Medrol  [Methylprednisolone ] Nausea Only    Feeling flushed    Family History  Problem Relation Age  of Onset   Hypertension Father    Pulmonary embolism Father    Anxiety disorder Father    Cancer Maternal Grandmother 25       breast   Arthritis Maternal Grandmother    Hyperlipidemia Maternal Grandmother    CAD Maternal Grandfather    Cancer Paternal Grandmother        melanoma, sinus cancer   Colon polyps Paternal Grandmother    Heart disease Paternal Grandfather    Gaucher's disease Half-Brother    Birth defects Brother    Birth defects Brother    Esophageal cancer Neg Hx    Stomach cancer Neg Hx    Liver disease Neg Hx     Social History   Socioeconomic History   Marital status: Married    Spouse name: Tanji Storrs.   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  Other Topics Concern   Not on file  Social History Narrative   Not on file   Social Drivers of Health   Financial Resource Strain: Low Risk  (01/19/2024)   Overall Financial Resource Strain (CARDIA)    Difficulty of Paying Living Expenses: Not hard at all  Food Insecurity: No Food Insecurity (01/19/2024)   Hunger Vital Sign    Worried About Running Out of Food in the Last Year: Never true    Ran Out of Food in the Last Year: Never true  Transportation Needs: No Transportation Needs (01/19/2024)   PRAPARE - Administrator, Civil Service (Medical): No    Lack of Transportation (Non-Medical): No  Physical Activity: Inactive (01/19/2024)   Exercise Vital Sign    Days of Exercise per Week: 0 days    Minutes of Exercise per Session: Not on file  Stress: No Stress Concern Present (01/19/2024)   [employer] of Occupational Health - Occupational Stress Questionnaire    Feeling of Stress: Only a little  Social Connections: Moderately Integrated (01/19/2024)   Social Connection and Isolation Panel    Frequency of Communication with Friends and Family: More than three times a week    Frequency of Social Gatherings with Friends and Family: Twice a week    Attends Religious Services: 1 to 4 times per year    Active Member of [employer] or Organizations: No    Attends Banker Meetings: Not on file    Marital Status: Married  Catering manager Violence: Not At Risk (08/23/2022)   Humiliation, Afraid, Rape, and Kick questionnaire    Fear of Current or Ex-Partner: No    Emotionally Abused: No     Physically Abused: No    Sexually Abused: No     Constitutional: Patient reports headaches.  Denies fever, malaise, fatigue, or abrupt weight changes.  HEENT: Pt reports sore throat. Denies eye pain, eye redness, ear pain, ringing in the ears, wax buildup, runny nose, nasal congestion, bloody nose. Respiratory: Denies difficulty breathing, shortness of breath, cough or sputum  production.   Cardiovascular: Denies chest pain, chest tightness, palpitations or swelling in the hands or feet.  Skin: Denies redness, rashes, lesions or ulcercations.    No other specific complaints in a complete review of systems (except as listed in HPI above).  Objective:   Physical Exam  BP 128/84 (BP Location: Left Arm, Patient Position: Sitting, Cuff Size: Normal)   Temp (!) 97.5 F (36.4 C)   Ht 5' (1.524 m)   Wt 171 lb 9.6 oz (77.8 kg)   LMP 12/22/2023 (Approximate)   BMI 33.51 kg/m     Wt Readings from Last 3 Encounters:  01/26/24 171 lb 9.6 oz (77.8 kg)  01/20/24 174 lb 3.2 oz (79 kg)  01/04/24 175 lb (79.4 kg)    General: Appears her stated age, obese, in NAD. Skin: Warm, dry and intact.  HEENT: Head: normal shape and size; Eyes: sclera white, no icterus, conjunctiva pink, PERRLA and EOMs intact; Throat: mucosa erythematous including uvula, no exudate, lesions noted. Neck: No adenopathy noted. Cardiovascular: Normal rate and rhythm.  Pulmonary/Chest: Normal effort and positive vesicular breath sounds. No respiratory distress. No wheezes, rales or ronchi noted.  Musculoskeletal:  No difficulty with gait.  Neurological: Alert and oriented.   BMET    Component Value Date/Time   NA 138 10/22/2023 0926   NA 136 01/28/2022 1047   K 4.4 10/22/2023 0926   CL 105 10/22/2023 0926   CO2 25 10/22/2023 0926   GLUCOSE 94 10/22/2023 0926   BUN 10 10/22/2023 0926   BUN 4 (L) 01/28/2022 1047   CREATININE 0.66 10/22/2023 0926   CALCIUM 9.3 10/22/2023 0926   GFRNONAA >60 07/06/2023 0840    GFRAA >60 04/09/2018 2151    Lipid Panel     Component Value Date/Time   CHOL 165 10/22/2023 0926   TRIG 110 10/22/2023 0926   HDL 36 (L) 10/22/2023 0926   CHOLHDL 4.6 10/22/2023 0926   LDLCALC 108 (H) 10/22/2023 0926    CBC    Component Value Date/Time   WBC 8.3 10/22/2023 0926   RBC 5.10 10/22/2023 0926   HGB 14.0 10/22/2023 0926   HGB 11.8 06/09/2022 0926   HCT 43.2 10/22/2023 0926   HCT 36.2 06/09/2022 0926   PLT 323 10/22/2023 0926   PLT 324 06/09/2022 0926   MCV 84.7 10/22/2023 0926   MCV 90 06/09/2022 0926   MCH 27.5 10/22/2023 0926   MCHC 32.4 10/22/2023 0926   RDW 14.4 10/22/2023 0926   RDW 13.4 06/09/2022 0926   LYMPHSABS 1.5 06/09/2022 0926   MONOABS 0.6 07/24/2021 1612   EOSABS 0.0 06/09/2022 0926   BASOSABS 0.0 06/09/2022 0926    Hgb A1C Lab Results  Component Value Date   HGBA1C 5.3 10/22/2023            Assessment & Plan:   Assessment and Plan    Acute strep pharyngitis E-visit notes reviewed.  Rapid strep positive. - Prescribed amoxicillin  500 mg twice daily for 10 days. - Advised ibuprofen  up to 600 mg every 8 hours as needed for analgesia. - Recommended saline gargles as needed. - Instructed to report if symptoms do not improve within 48 hours.      RTC in 3 months, follow-up chronic conditions Angeline Laura, NP

## 2024-01-26 NOTE — Patient Instructions (Signed)
 Strep Throat, Adult Strep throat is an infection of the throat. It is caused by germs (bacteria). Strep throat is common during the cold months of the year. It mostly affects children who are 8-24 years old. However, people of all ages can get it at any time of the year. This infection spreads from person to person through coughing, sneezing, or having close contact. What are the causes? This condition is caused by the Streptococcus pyogenes germ. What increases the risk? 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 Children are more likely to get strep throat and may spread it to others. You go to crowded places. Germs can spread easily in such places. You kiss or touch someone who has strep throat. What are the signs or symptoms? Fever or chills. Redness, swelling, or pain in the tonsils or throat. Pain or trouble when swallowing. White or yellow spots on the tonsils or throat. Tender glands in the neck and under the jaw. Bad breath. Red rash all over the body. This is rare. How is this treated? Medicines that kill germs (antibiotics). Medicines that treat pain or fever. These include: Ibuprofen or acetaminophen. Aspirin, only for people who are over the age of 54. Cough drops. Throat sprays. Follow these instructions at home: Medicines  Take over-the-counter and prescription medicines only as told by your doctor. Take your antibiotic medicine as told by your doctor. Do not stop taking the antibiotic even if you start to feel better. Eating and drinking  If you have trouble swallowing, eat soft foods until your throat feels better. Drink enough fluid to keep your pee (urine) pale yellow. To help with pain, you may have: Warm fluids, such as soup and tea. Cold fluids, such as frozen desserts or popsicles. General instructions Rinse your mouth (gargle) with a salt-water mixture 3-4 times a day or as needed. To make a salt-water mixture, dissolve -1 tsp (3-6 g) of salt in 1 cup (237 mL) of warm  water. Rest as much as you can. Stay home from work or school until you have been taking antibiotics for 24 hours. Do not smoke or use any products that contain nicotine or tobacco. If you need help quitting, ask your doctor. Keep all follow-up visits. How is this prevented?  Do not share food, drinking cups, or personal items. They can cause the germs to spread. Wash your hands well with soap and water. Make sure that all people in your house wash their hands well. Have family members tested if they have a fever or a sore throat. They may need an antibiotic if they have strep throat. Contact a doctor if: You have swelling in your neck that keeps getting bigger. You get a rash, cough, or earache. You cough up a thick fluid that is green, yellow-brown, or bloody. You have pain that does not get better with medicine. Your symptoms get worse instead of getting better. You have a fever. Get help right away if: You vomit. You have a very bad headache. Your neck hurts or feels stiff. You have chest pain or are short of breath. You have drooling, very bad throat pain, or changes in your voice. Your neck is swollen, or the skin gets red and tender. Your mouth is dry, or you are peeing less than normal. You keep feeling more tired or have trouble waking up. Your joints are red or painful. These symptoms may be an emergency. Do not wait to see if the symptoms will go away. Get help right away. Call  your local emergency services (911 in the [place] Summary Strep throat is an infection of the throat. It is caused by germs (bacteria). This infection can spread from person to person through coughing, sneezing, or having close contact. Take your medicines, including antibiotics, as told by your doctor. Do not stop taking the antibiotic even if you start to feel better. To prevent the spread of germs, wash your hands well with soap and water. Have others do the same. Do not share food, drinking cups,  or personal items. Get help right away if you have a bad headache, chest pain, shortness of breath, a stiff or painful neck, or you vomit. This information is not intended to replace advice given to you by your health care provider. Make sure you discuss any questions you have with your health care provider. Document Revised: 08/20/2020 Document Reviewed: 08/20/2020 Elsevier Patient Education  2024 [employer].

## 2024-02-03 ENCOUNTER — Ambulatory Visit: Admitting: Neurosurgery

## 2024-02-17 ENCOUNTER — Encounter: Payer: Self-pay | Admitting: Neurosurgery

## 2024-03-10 ENCOUNTER — Other Ambulatory Visit: Payer: Self-pay

## 2024-03-10 ENCOUNTER — Telehealth (INDEPENDENT_AMBULATORY_CARE_PROVIDER_SITE_OTHER): Admitting: Internal Medicine

## 2024-03-10 ENCOUNTER — Encounter: Payer: Self-pay | Admitting: Internal Medicine

## 2024-03-10 DIAGNOSIS — F411 Generalized anxiety disorder: Secondary | ICD-10-CM

## 2024-03-10 DIAGNOSIS — F410 Panic disorder [episodic paroxysmal anxiety] without agoraphobia: Secondary | ICD-10-CM

## 2024-03-10 DIAGNOSIS — F439 Reaction to severe stress, unspecified: Secondary | ICD-10-CM

## 2024-03-10 MED ORDER — BUSPIRONE HCL 10 MG PO TABS
10.0000 mg | ORAL_TABLET | Freq: Two times a day (BID) | ORAL | 0 refills | Status: AC
  Filled 2024-03-10: qty 180, 90d supply, fill #0

## 2024-03-10 MED ORDER — ESCITALOPRAM OXALATE 20 MG PO TABS
20.0000 mg | ORAL_TABLET | Freq: Every day | ORAL | 0 refills | Status: DC
  Filled 2024-03-10: qty 90, 90d supply, fill #0

## 2024-03-10 NOTE — Progress Notes (Signed)
 Virtual Visit via Video Note  I connected with Tamara Mcclain on 03/10/24 at 11:40 AM EDT by a video enabled telemedicine application and verified that I am speaking with the correct person using two identifiers.  Location: Patient: Work Provider: Engineer, Structural in this video call: Angeline Laura, NP-C and Tanija Ciano   I discussed the limitations of evaluation and management by telemedicine and the availability of in person appointments. The patient expressed understanding and agreed to proceed.  History of Present Illness:   Discussed the use of AI scribe software for clinical note transcription with the patient, who gave verbal consent to proceed.  Tamara Mcclain is a 23 year old female who presents with worsening anxiety and panic attacks.  She has been experiencing severe panic attacks, which have intensified to the point of having to leave work early. The stress is exacerbated by ongoing legal issues involving her partner, which have been a significant source of anxiety.  She is currently taking lexapro  and buspirone  as needed for anxiety.  No current depression or trouble sleeping. She is managing the care of her child alone, as her partner is incarcerated. Although she is technically allowed to visit her, she feels mentally unprepared to do so.  She has not been seeing a therapist.     Past Medical History:  Diagnosis Date   Allergy    Anxiety    Asthma    mild; flares up only when sick   Clotting disorder    Epiploic appendagitis    Lumbosacral disc herniation    L5-S1   MTHFR mutation    Primary hypertension    Von Willebrand disease (HCC) Dr. Sebastian at [facility]   diagnosed when teenager due to large menstrual blood clots; after seeing hematology 2024, levels did not indicate the disease    Current Outpatient Medications  Medication Sig Dispense Refill   albuterol  (VENTOLIN  HFA) 108 (90 Base) MCG/ACT inhaler Inhale 1-2 puffs into the  lungs every 6 (six) hours as needed for wheezing or shortness of breath. 8 g 2   amoxicillin  (AMOXIL ) 500 MG capsule Take 1 capsule (500 mg total) by mouth 2 (two) times daily. 4 capsule 0   busPIRone  (BUSPAR ) 5 MG tablet Take 1 tablet (5 mg total) by mouth 2 (two) times daily as needed. 60 tablet 1   escitalopram  (LEXAPRO ) 10 MG tablet Take 1 tablet (10 mg total) by mouth daily. 90 tablet 1   gabapentin  (NEURONTIN ) 300 MG capsule Take 1 capsule (300 mg total) by mouth 3 (three) times daily. Start 1 pill at night time for 1 week, then add a morning dose for a week, then go to 3 times per day (Patient taking differently: Take 300 mg by mouth as needed. Start 1 pill at night time for 1 week, then add a morning dose for a week, then go to 3 times per day) 90 capsule 0   ibuprofen  (ADVIL ) 200 MG tablet Take 400-600 mg by mouth every 8 (eight) hours as needed (pain.).     levonorgestrel  (MIRENA ) 20 MCG/DAY IUD 1 each by Intrauterine route once.     methocarbamol  (ROBAXIN ) 500 MG tablet Take 1 tablet (500 mg total) by mouth every 6 (six) hours as needed for muscle spasms. 120 tablet 0   ondansetron  (ZOFRAN -ODT) 4 MG disintegrating tablet Take 1 tablet (4 mg total) by mouth every 8 (eight) hours as needed for nausea or vomiting. 30 tablet 0   No current facility-administered medications  for this visit.    Allergies  Allergen Reactions   Tylenol  [Acetaminophen ] Other (See Comments)    fever   Depo-Medrol  [Methylprednisolone ] Nausea Only    Feeling flushed    Family History  Problem Relation Age of Onset   Hypertension Father    Pulmonary embolism Father    Anxiety disorder Father    Cancer Maternal Grandmother 5       breast   Arthritis Maternal Grandmother    Hyperlipidemia Maternal Grandmother    CAD Maternal Grandfather    Cancer Paternal Grandmother        melanoma, sinus cancer   Colon polyps Paternal Grandmother    Heart disease Paternal Grandfather    Gaucher's disease Half-Brother     Birth defects Brother    Birth defects Brother    Esophageal cancer Neg Hx    Stomach cancer Neg Hx    Liver disease Neg Hx     Social History   Socioeconomic History   Marital status: Married    Spouse name: Sashia Campas.   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  Other Topics Concern   Not on file  Social History Narrative   Not on file   Social Drivers of Health   Financial Resource Strain: Low Risk  (03/09/2024)   Overall Financial Resource Strain (CARDIA)    Difficulty of Paying Living Expenses: Not hard at all  Food Insecurity: No Food Insecurity (03/09/2024)   Hunger Vital Sign    Worried About Running Out of Food in the Last Year: Never true    Ran Out of Food in the Last Year: Never true  Transportation Needs: No Transportation Needs (03/09/2024)   PRAPARE - Administrator, Civil Service (Medical): No    Lack of Transportation (Non-Medical): No  Physical Activity: Insufficiently Active (03/09/2024)   Exercise Vital Sign    Days of Exercise per Week: 2 days    Minutes of Exercise per Session: 10 min  Stress: Stress Concern Present (03/09/2024)   [employer] of Occupational Health - Occupational Stress Questionnaire    Feeling of Stress: To some extent  Social Connections: Moderately Isolated (03/09/2024)   Social Connection and Isolation Panel    Frequency of Communication with Friends and Family: More than three times a week    Frequency of Social Gatherings with Friends and  Family: Once a week    Attends Religious Services: Never    Database Administrator or Organizations: No    Attends Engineer, Structural: Not on file    Marital Status: Married  Catering Manager Violence: Not At Risk (08/23/2022)   Humiliation, Afraid, Rape, and Kick questionnaire    Fear of Current or Ex-Partner: No    Emotionally Abused: No    Physically Abused: No    Sexually Abused: No     Constitutional: Denies fever, malaise, fatigue, headache or abrupt weight changes.  HEENT: Denies  eye pain, eye redness, ear pain, ringing in the ears, wax buildup, runny nose, nasal congestion, bloody nose, or sore throat. Respiratory: Denies difficulty breathing, shortness of breath, cough or sputum production.   Cardiovascular: Denies chest pain, chest tightness, palpitations or swelling in the hands or feet.  Gastrointestinal: Denies abdominal pain, bloating, constipation, diarrhea or blood in the stool.  GU: Denies urgency, frequency, pain with urination, burning sensation, blood in urine, odor or discharge. Musculoskeletal: Patient reports low back pain.  Denies decrease in range of motion, difficulty with gait, muscle pain or joint swelling.  Skin: Denies redness, rashes, lesions or ulcercations.  Neurological: Patient reports paresthesia of lower extremities.  Denies dizziness, difficulty with memory, difficulty with speech or problems with balance and coordination.  Psych: Patient has a history of anxiety.  Denies depression, SI/HI.  No other specific complaints in a complete review of systems (except as listed in HPI above).  Observations/Objective:   Wt Readings from Last 3 Encounters:  01/26/24 171 lb 9.6 oz (77.8 kg)  01/20/24 174 lb 3.2 oz (79 kg)  01/04/24 175 lb (79.4 kg)    General: Appears her stated age, obese, in NAD. Pulmonary/Chest: Normal effort. No respiratory distress.   Neurological: Alert and oriented.  Psychiatric: Mood and affect normal.  Mildly anxious  appearing.  Judgment and thought content normal.     BMET    Component Value Date/Time   NA 138 10/22/2023 0926   NA 136 01/28/2022 1047   K 4.4 10/22/2023 0926   CL 105 10/22/2023 0926   CO2 25 10/22/2023 0926   GLUCOSE 94 10/22/2023 0926   BUN 10 10/22/2023 0926   BUN 4 (L) 01/28/2022 1047   CREATININE 0.66 10/22/2023 0926   CALCIUM 9.3 10/22/2023 0926   GFRNONAA >60 07/06/2023 0840   GFRAA >60 04/09/2018 2151    Lipid Panel     Component Value Date/Time   CHOL 165 10/22/2023 0926   TRIG 110 10/22/2023 0926   HDL 36 (L) 10/22/2023 0926   CHOLHDL 4.6 10/22/2023 0926   LDLCALC 108 (H) 10/22/2023 0926    CBC    Component Value Date/Time   WBC 8.3 10/22/2023 0926   RBC 5.10 10/22/2023 0926   HGB 14.0 10/22/2023 0926   HGB 11.8 06/09/2022 0926   HCT 43.2 10/22/2023 0926   HCT 36.2 06/09/2022 0926   PLT 323 10/22/2023 0926   PLT 324 06/09/2022 0926   MCV 84.7 10/22/2023 0926   MCV 90 06/09/2022 0926   MCH 27.5 10/22/2023 0926   MCHC 32.4 10/22/2023 0926   RDW 14.4 10/22/2023 0926   RDW 13.4 06/09/2022 0926   LYMPHSABS 1.5 06/09/2022 0926   MONOABS 0.6 07/24/2021 1612   EOSABS 0.0 06/09/2022 0926   BASOSABS 0.0 06/09/2022 0926    Hgb A1C Lab Results  Component Value Date   HGBA1C 5.3 10/22/2023       Assessment and Plan: Assessment and Plan    Generalized anxiety disorder Exacerbation of symptoms with severe panic attacks. Current medication regimen inadequate. Hydroxyzine  declined due to sedative effects. - Increase Lexapro  to 20 mg daily. Use two 10 mg tablets if available. - Increase buspirone  to 10 mg twice daily. Use two 5 mg tablets if available.  - Support offered        RTC in 2 months for follow-up of chronic conditions Follow Up Instructions:    I discussed the assessment and treatment plan with the patient. The patient was provided an opportunity to ask  questions and all were answered. The patient agreed with the plan and  demonstrated an understanding of the instructions.   The patient was advised to call back or seek an in-person evaluation if the symptoms worsen or if the condition fails to improve as anticipated.   Angeline Laura, NP

## 2024-03-10 NOTE — Patient Instructions (Signed)

## 2024-03-20 ENCOUNTER — Ambulatory Visit: Admitting: Physician Assistant

## 2024-03-29 ENCOUNTER — Ambulatory Visit (INDEPENDENT_AMBULATORY_CARE_PROVIDER_SITE_OTHER): Admitting: Nurse Practitioner

## 2024-03-29 ENCOUNTER — Encounter: Payer: Self-pay | Admitting: Nurse Practitioner

## 2024-03-29 ENCOUNTER — Other Ambulatory Visit: Payer: Self-pay

## 2024-03-29 VITALS — BP 118/72 | HR 98 | Temp 98.5°F | Resp 16 | Ht 60.0 in | Wt 178.3 lb

## 2024-03-29 DIAGNOSIS — J4520 Mild intermittent asthma, uncomplicated: Secondary | ICD-10-CM | POA: Diagnosis not present

## 2024-03-29 DIAGNOSIS — F411 Generalized anxiety disorder: Secondary | ICD-10-CM

## 2024-03-29 DIAGNOSIS — F410 Panic disorder [episodic paroxysmal anxiety] without agoraphobia: Secondary | ICD-10-CM | POA: Diagnosis not present

## 2024-03-29 DIAGNOSIS — M5416 Radiculopathy, lumbar region: Secondary | ICD-10-CM

## 2024-03-29 DIAGNOSIS — D6800 Von Willebrand disease, unspecified: Secondary | ICD-10-CM

## 2024-03-29 DIAGNOSIS — Z7689 Persons encountering health services in other specified circumstances: Secondary | ICD-10-CM

## 2024-03-29 DIAGNOSIS — E7800 Pure hypercholesterolemia, unspecified: Secondary | ICD-10-CM

## 2024-03-29 DIAGNOSIS — E66812 Obesity, class 2: Secondary | ICD-10-CM

## 2024-03-29 DIAGNOSIS — Z6836 Body mass index (BMI) 36.0-36.9, adult: Secondary | ICD-10-CM

## 2024-03-29 NOTE — Progress Notes (Signed)
 BP 118/72 (Cuff Size: Large)   Pulse 98   Temp 98.5 F (36.9 C) (Oral)   Resp 16   Ht 5' (1.524 m)   Wt 178 lb 4.8 oz (80.9 kg)   SpO2 98%   BMI 34.82 kg/m    Subjective:    Patient ID: Demira Ellen Fildes, female    DOB: 22-Jan-2001, 23 y.o.   MRN: [id number]  HPI: Kyri Ellen Elbert is a 23 y.o. female  Chief Complaint  Patient presents with   Establish Care   Anxiety   Discussed the use of AI scribe software for clinical note transcription with the patient, who gave verbal consent to proceed.  History of Present Illness Norelle Montia Haslip is a 23 year old female who presents to establish care.  Anxiety symptoms and medication side effects - Severe anxiety, currently managed with Lexapro  20 mg daily and buspirone  10 mg twice daily - Anxiety exacerbated by husband's wrongful conviction and imprisonment, resulting in increased stress as sole provider for one-year-old child - Lexapro  causes persistent nausea, even at lower doses  Lumbar radiculopathy and recurrent herniated discs - Undergoing treatment for lumbar radiculopathy and recurrent herniated discs - History of two back surgeries within the past year - Gabapentin  300 mg taken during severe pain episodes - Pain intensity occasionally approaches threshold for emergency care  Asthma symptoms - Asthma exacerbates during upper respiratory infections, requiring frequent inhaler use - Asymptomatic outside of cold episodes  Hypertensive disorders of pregnancy - History of elevated blood pressure during pregnancy - Currently normotensive, with recent blood pressure of 118/72  Bleeding diathesis and von willebrand disease - Diagnosed with Von Willebrand disease at age 69 due to menorrhagia and passage of large blood clots - Subsequent diagnostic tests have yielded both positive and negative results, resulting in ongoing diagnostic uncertainty - Prolonged bleeding from minor cuts  Hyperlipidemia - Slightly  elevated cholesterol in June 2025 - A1c and other blood counts within normal limits  Left ear fullness - Muffled sensation in left ear for several days - Took Zyrtec last week for symptom management   -obesity Wt Readings from Last 3 Encounters:  03/29/24 178 lb 4.8 oz (80.9 kg)  01/26/24 171 lb 9.6 oz (77.8 kg)  01/20/24 174 lb 3.2 oz (79 kg)   Body mass index is 34.82 kg/m.   - Encourage continuation of lifestyle modifications, including dietary management and regular exercise. -continue to increase physical activity, getting at least 150 min of physical activity a week.  Work on including runner, broadcasting/film/video 2 days a week.  - continue eating at a calorie deficit 1600-1700 cal a day, eating a well balanced diet with whole foods, avoiding processed foods.   Patient is motivated to continue working on lifestyle modification.       03/29/2024    7:40 AM 01/26/2024    9:04 AM 01/20/2024    8:44 AM  Depression screen PHQ 2/9  Decreased Interest 0 0 0  Down, Depressed, Hopeless 0 0 0  PHQ - 2 Score 0 0 0  Altered sleeping 0 0 0  Tired, decreased energy 0 0 1  Change in appetite 0 0 0  Feeling bad or failure about yourself  0 0 0  Trouble concentrating 0 0 0  Moving slowly or fidgety/restless 0 0 0  Suicidal thoughts 0 0 0  PHQ-9 Score 0 0  1   Difficult doing work/chores Not difficult at all Not difficult at all Not difficult at all  Data saved with a previous flowsheet row definition       03/29/2024    7:40 AM 01/26/2024    9:04 AM 01/20/2024    8:44 AM 12/06/2023    1:56 PM  GAD 7 : Generalized Anxiety Score  Nervous, Anxious, on Edge 1 0 2 1  Control/stop worrying 1 0 1 0  Worry too much - different things 1 0 2 0  Trouble relaxing 0 0 0 0  Restless 0 0 0 0  Easily annoyed or irritable 1 0 1 1  Afraid - awful might happen 1 0 0 0  Total GAD 7 Score 5 0 6 2  Anxiety Difficulty Somewhat difficult Not difficult at all Not difficult at all Not difficult at all      Relevant past medical, surgical, family and social history reviewed and updated as indicated. Interim medical history since our last visit reviewed. Allergies and medications reviewed and updated.  Review of Systems  Constitutional: Negative for fever or weight change.  Respiratory: Negative for cough and shortness of breath.   Cardiovascular: Negative for chest pain or palpitations.  Gastrointestinal: Negative for abdominal pain, no bowel changes.  Musculoskeletal: Negative for gait problem or joint swelling.  Skin: Negative for rash.  Neurological: Negative for dizziness or headache.  No other specific complaints in a complete review of systems (except as listed in HPI above).      Objective:      BP 118/72 (Cuff Size: Large)   Pulse 98   Temp 98.5 F (36.9 C) (Oral)   Resp 16   Ht 5' (1.524 m)   Wt 178 lb 4.8 oz (80.9 kg)   SpO2 98%   BMI 34.82 kg/m    Wt Readings from Last 3 Encounters:  03/29/24 178 lb 4.8 oz (80.9 kg)  01/26/24 171 lb 9.6 oz (77.8 kg)  01/20/24 174 lb 3.2 oz (79 kg)    Physical Exam VITALS: BP- 118/72 GENERAL: Alert, cooperative, well developed, no acute distress. HEENT: Normocephalic, normal oropharynx, moist mucous membranes, ears normal. CHEST: Clear to auscultation bilaterally, no wheezes, rhonchi, or crackles. CARDIOVASCULAR: Normal heart rate and rhythm, S1 and S2 normal without murmurs, heart sounds normal. ABDOMEN: Soft, non-tender, non-distended, without organomegaly, normal bowel sounds. EXTREMITIES: No cyanosis or edema. NEUROLOGICAL: Cranial nerves grossly intact, moves all extremities without gross motor or sensory deficit.          Assessment & Plan:   Problem List Items Addressed This Visit       Respiratory   Asthma - Primary     Nervous and Auditory   Lumbar radiculopathy     Hematopoietic and Hemostatic   Von Willebrand disease (HCC) (Chronic)   Relevant Orders   Ambulatory referral to Hematology / Oncology      Other   Panic attacks   Class 2 obesity due to excess calories with body mass index (BMI) of 36.0 to 36.9 in adult   GAD (generalized anxiety disorder)   Pure hypercholesterolemia   Other Visit Diagnoses       Encounter to establish care            Assessment and Plan Assessment & Plan Establishing Care Establishing care with multiple ongoing health issues requiring management.  Lumbar radiculopathy with recurrent herniated discs Chronic lumbar radiculopathy with recurrent herniated discs. Two previous surgeries with a recommendation for a third surgery and fusion, which she is hesitant to pursue due to recovery concerns. Gabapentin  is used sparingly due to  sedative effects. - Recommended taking gabapentin  300 mg at bedtime to prevent severe pain episodes.  Generalized anxiety disorder and panic disorder, medication-induced nausea Generalized anxiety disorder and panic disorder exacerbated by personal stressors. Current treatment with Lexapro  20 mg daily and buspirone  10 mg twice daily. Lexapro  causes significant nausea, even at lower doses. Gene site testing discussed to guide medication choice. - Reduce Lexapro  to 10 mg daily for one week. - Ordered gene site testing to guide future medication choices. - Continue buspirone  as needed for anxiety.  Asthma, mild intermittent Mild intermittent asthma exacerbated by colds, requiring frequent inhaler use during these episodes. - Refilled inhaler prescription.  Suspected von Willebrand disease Conflicting test results. Previous tests have been both positive and negative, causing confusion. Hematology referral needed for further evaluation. - Referred to hematology for further evaluation of von Willebrand disease.  Pure hypercholesterolemia Mildly elevated cholesterol levels noted in June. Advised to monitor dietary intake to manage cholesterol levels. - Advised dietary modifications to reduce intake of fatty foods such as butter  and red meat.  Eustachian tube dysfunction, left ear Left ear muffled hearing likely due to sinus congestion. No abnormalities on ear examination. - Continue Zyrtec nightly to manage sinus congestion.   -obesity Wt Readings from Last 3 Encounters:  03/29/24 178 lb 4.8 oz (80.9 kg)  01/26/24 171 lb 9.6 oz (77.8 kg)  01/20/24 174 lb 3.2 oz (79 kg)   Body mass index is 34.82 kg/m.   - Encourage continuation of lifestyle modifications, including dietary management and regular exercise. -continue to increase physical activity, getting at least 150 min of physical activity a week.  Work on including runner, broadcasting/film/video 2 days a week.  - continue eating at a calorie deficit 1600-1700 cal a day, eating a well balanced diet with whole foods, avoiding processed foods.   Patient is motivated to continue working on lifestyle modification.        Follow up plan: Return in about 4 weeks (around 04/26/2024) for follow up.

## 2024-04-03 ENCOUNTER — Encounter: Payer: Self-pay | Admitting: Nurse Practitioner

## 2024-04-03 ENCOUNTER — Other Ambulatory Visit: Payer: Self-pay | Admitting: Nurse Practitioner

## 2024-04-03 DIAGNOSIS — F410 Panic disorder [episodic paroxysmal anxiety] without agoraphobia: Secondary | ICD-10-CM

## 2024-04-03 DIAGNOSIS — J4520 Mild intermittent asthma, uncomplicated: Secondary | ICD-10-CM

## 2024-04-03 DIAGNOSIS — K6389 Other specified diseases of intestine: Secondary | ICD-10-CM

## 2024-04-03 DIAGNOSIS — Z6836 Body mass index (BMI) 36.0-36.9, adult: Secondary | ICD-10-CM

## 2024-04-03 MED ORDER — DULOXETINE HCL 30 MG PO CPEP: 30.0000 mg | ORAL_CAPSULE | Freq: Every day | ORAL | 0 refills | Status: AC

## 2024-04-03 MED ORDER — ONDANSETRON 4 MG PO TBDP: 4.0000 mg | ORAL_TABLET | Freq: Three times a day (TID) | ORAL | 1 refills | Status: DC | PRN

## 2024-04-03 MED ORDER — ALBUTEROL SULFATE HFA 108 (90 BASE) MCG/ACT IN AERS: 1.0000 | INHALATION_SPRAY | Freq: Four times a day (QID) | RESPIRATORY_TRACT | 2 refills | Status: AC | PRN

## 2024-04-04 ENCOUNTER — Telehealth: Payer: Self-pay | Admitting: Oncology

## 2024-04-04 NOTE — Telephone Encounter (Signed)
 I called pt to schedule the appts from inbasket and pt stated she wants to see a different provider. She stated nothing against Dr.Yu she just wants to see anybody else. I told her I would have to send a message and then someone else would call her back.

## 2024-04-12 ENCOUNTER — Inpatient Hospital Stay: Attending: Nurse Practitioner | Admitting: Nurse Practitioner

## 2024-04-12 ENCOUNTER — Inpatient Hospital Stay

## 2024-04-12 VITALS — BP 129/89 | HR 90 | Resp 18 | Ht 60.0 in | Wt 177.0 lb

## 2024-04-12 DIAGNOSIS — D6800 Von Willebrand disease, unspecified: Secondary | ICD-10-CM | POA: Insufficient documentation

## 2024-04-12 NOTE — Progress Notes (Unsigned)
 She wants to know if she actually has VWD or not.

## 2024-04-13 NOTE — Progress Notes (Signed)
 Hematology/Oncology Progress Note [phone] [phone]      CHIEF COMPLAINTS/REASON FOR VISIT:  Follow up for history of von Willebrand disease   HISTORY OF PRESENTING ILLNESS:   Tamara Mcclain is a  23 y.o.  female with PMH listed below was seen in consultation at the request of  [name], FNP  for evaluation of von Willebrand disease Patient is currently pregnant, 11 weeks Patient reports history of von Willebrand disease which was diagnosed at [facility] in 2014.  She had heavy menstrual period when she was 23 years of age.  Extensive medical records review in care everywhere was performed by me.  02/16/2013 patient was seen by Dr. Armin Hummer at [facility] for menorrhagia.  Patient did not have prior history of bleeding symptoms. denies increase in bruising or other type of bleeding such as epistaxis, gum bleeds or hematuria. She had several teeth extractions and family can not recall complications due to bleeding  02/16/2013  PT and PTT were both within normal limits.  Normal platelet function testing.  Von Willebrand panels were sent. von Willebrand factor antigen 134 [47-157%], von Willebrand factor activity 140 [50-168%], factor VIII activity 180 [54-161%].  Multimer was not indicated.  Antithrombin III activity 126 [83-123%] patient also had hypercoagulable work-up which were negative [normal protein S activity, protein S antigen, normal protein C activity, negative anticardiolipin antibodies, negative beta-2 glycoprotein antibodies.  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 Patient was recommended to start on OCP which she continued use until 2020. 03/16/2013, patient had a follow-up visit.  There was no documentation of official von Willebrand disease in her note.  The test result is not consistent with von Willebrand disease.    Patient reports that she was told by her  parents that she has von Willebrand disease. Denies any easy bruising or active bleeding events.  INTERVAL HISTORY Tamara Mcclain is a 23 y.o. female who has above history reviewed by me today presents for follow up visit for Follow up for history of von Willebrand disease Patient was pregnant during last visit.  She has since given birth to a healthy child, no complications during child birth, no excessive bleeding events with childbirth or since.  Currently has IUD and no longer having monthly menstrual cycle. Patient request to switch physicians from Dr. Babara to Dr. Jacobo care because he is the physician that takes care of her family member.  Reviewed case with Dr. Jacobo.  We reviewed all blood work as well as postpartum von Counsellor which did not indicate von Willebrand's disease.  Being that she has a normal von Willebrand's panel postpartum.  No significant bleeding events.  Healthy childbirth without hemorrhage.  He does not feel that there is a need for ongoing follow-up.  Her blood work is not compatible with von Willebrand's disease.We discussed that during pregnancy the von Willebrand's panel could be falsely elevated in her case it was.  However this still does not indicate von Willebrand's disease.  The postpartum von Willebrand's panel was negative.  As well as the coag panel was negative for von Willebrand's disease as well.  I instructed her to continue to follow-up closely with primary care and OB/GYN for routine health exams.  I encouraged her to come back and establish care here at hematology and any event of anemia or any other hematology concerns that may arise.  She verbalizes understanding and agreement with  plan.   MEDICAL HISTORY:  Past Medical History:  Diagnosis Date   Allergy    Anxiety    Asthma    mild; flares up only when sick   Clotting disorder    Epiploic appendagitis    Lumbosacral disc herniation    L5-S1   MTHFR mutation    Primary  hypertension    Von Willebrand disease (HCC) Dr. Sebastian at [facility]   diagnosed when teenager due to large menstrual blood clots; after seeing hematology 2024, levels did not indicate the disease    SURGICAL HISTORY: Past Surgical History:  Procedure Laterality Date   ESOPHAGOGASTRODUODENOSCOPY (EGD) WITH PROPOFOL  N/A 01/30/2020   Procedure: ESOPHAGOGASTRODUODENOSCOPY (EGD) WITH PROPOFOL ;  Surgeon: Therisa Bi, MD;  Location: [facility] ENDOSCOPY;  Service: Gastroenterology;  Laterality: N/A;   FORAMINOTOMY 1 LEVEL Left 07/12/2023   Procedure: LEFT L5-S1 LAMINOFORAMINOTOMY;  Surgeon: Clois Fret, MD;  Location: [facility] ORS;  Service: Neurosurgery;  Laterality: Left;   LUMBAR LAMINECTOMY/DECOMPRESSION MICRODISCECTOMY Right 03/17/2023   Procedure: RIGHT L5-S1 MICRODISCECTOMY;  Surgeon: Clois Fret, MD;  Location: [facility] ORS;  Service: Neurosurgery;  Laterality: Right;   SPINE SURGERY     WISDOM TOOTH EXTRACTION  08/2018   no bleeding complications    SOCIAL HISTORY: Social History   Socioeconomic History   Marital status: Married    Spouse name: Starleen Trussell.   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  Other Topics Concern   Not on file  Social History Narrative   CMA at [employer]   Social Drivers of Health   Financial Resource Strain: Low Risk  (03/09/2024)   Overall Financial Resource Strain (CARDIA)    Difficulty of Paying Living Expenses: Not hard at all  Food Insecurity: No Food Insecurity (03/09/2024)    Hunger Vital Sign    Worried About Running Out of Food in the Last Year: Never true    Ran Out of Food in the Last Year: Never true  Transportation Needs: No Transportation Needs (03/09/2024)   PRAPARE - Administrator, Civil Service (Medical): No    Lack of Transportation (Non-Medical): No  Physical Activity: Insufficiently Active (03/09/2024)   Exercise Vital Sign    Days of Exercise per Week: 2 days    Minutes of Exercise per Session: 10 min  Stress: Stress Concern Present (03/09/2024)   [employer] of Occupational Health - Occupational Stress Questionnaire    Feeling of Stress: To some extent  Social Connections: Moderately Isolated (03/09/2024)   Social Connection and Isolation Panel    Frequency of Communication with Friends and Family: More than three times a week    Frequency of Social Gatherings with Friends and Family: Once a week    Attends Religious Services: Never    Database Administrator or Organizations: No    Attends Banker Meetings: Not on file    Marital Status: Married  Intimate Partner Violence: Not At Risk (08/23/2022)  Humiliation, Afraid, Rape, and Kick questionnaire    Fear of Current or Ex-Partner: No    Emotionally Abused: No    Physically Abused: No    Sexually Abused: No    FAMILY HISTORY: Family History  Problem Relation Age of Onset   Hypertension Father    Pulmonary embolism Father    Anxiety disorder Father    Cancer Maternal Grandmother 20       breast   Arthritis Maternal Grandmother    Hyperlipidemia Maternal Grandmother    CAD Maternal Grandfather    Cancer Paternal Grandmother        melanoma, sinus cancer   Colon polyps Paternal Grandmother    Heart disease Paternal Grandfather    Gaucher's disease Half-Brother    Birth defects Brother    Birth defects Brother    Esophageal cancer Neg Hx    Stomach cancer Neg Hx    Liver disease Neg Hx     ALLERGIES:  is allergic to tylenol  [acetaminophen ] and  depo-medrol  [methylprednisolone ].  MEDICATIONS:  Current Outpatient Medications  Medication Sig Dispense Refill   albuterol  (VENTOLIN  HFA) 108 (90 Base) MCG/ACT inhaler Inhale 1-2 puffs into the lungs every 6 (six) hours as needed for wheezing or shortness of breath. 8 g 2   busPIRone  (BUSPAR ) 10 MG tablet Take 1 tablet (10 mg total) by mouth 2 (two) times daily. 180 tablet 0   DULoxetine  (CYMBALTA ) 30 MG capsule Take 1 capsule (30 mg total) by mouth daily. 30 capsule 0   gabapentin  (NEURONTIN ) 300 MG capsule Take 1 capsule (300 mg total) by mouth 3 (three) times daily. Start 1 pill at night time for 1 week, then add a morning dose for a week, then go to 3 times per day 90 capsule 0   ibuprofen  (ADVIL ) 200 MG tablet Take 400-600 mg by mouth every 8 (eight) hours as needed (pain.).     levonorgestrel  (MIRENA ) 20 MCG/DAY IUD 1 each by Intrauterine route once.     methocarbamol  (ROBAXIN ) 500 MG tablet Take 1 tablet (500 mg total) by mouth every 6 (six) hours as needed for muscle spasms. 120 tablet 0   ondansetron  (ZOFRAN -ODT) 4 MG disintegrating tablet Take 1 tablet (4 mg total) by mouth every 8 (eight) hours as needed for nausea or vomiting. 30 tablet 1   No current facility-administered medications for this visit.    Review of Systems  Constitutional:  Negative for appetite change, chills, fatigue and fever.  HENT:   Negative for hearing loss and voice change.   Eyes:  Negative for eye problems.  Respiratory:  Negative for chest tightness and cough.   Cardiovascular:  Negative for chest pain.  Gastrointestinal:  Negative for abdominal distention, abdominal pain and blood in stool.  Endocrine: Negative for hot flashes.  Genitourinary:  Negative for difficulty urinating and frequency.   Musculoskeletal:  Negative for arthralgias.  Skin:  Negative for itching and rash.  Neurological:  Negative for extremity weakness.  Hematological:  Negative for adenopathy.  Psychiatric/Behavioral:   Negative for confusion.    PHYSICAL EXAMINATION: ECOG PERFORMANCE STATUS: 0 - Asymptomatic Vitals:   04/12/24 0907  BP: 129/89  Pulse: 90  Resp: 18  SpO2: 95%   Filed Weights   04/12/24 0900  Weight: 177 lb (80.3 kg)    Physical Exam Constitutional:      General: She is not in acute distress. HENT:     Head: Normocephalic and atraumatic.  Eyes:     General: No scleral  icterus. Cardiovascular:     Rate and Rhythm: Normal rate.  Pulmonary:     Effort: Pulmonary effort is normal. No respiratory distress.  Abdominal:     General: There is no distension.  Musculoskeletal:        General: No deformity. Normal range of motion.     Cervical back: Normal range of motion and neck supple.  Skin:    Findings: No erythema.  Neurological:     Mental Status: She is alert and oriented to person, place, and time. Mental status is at baseline.  Psychiatric:        Mood and Affect: Mood normal.     LABORATORY DATA:  I have reviewed the data as listed    Latest Ref Rng & Units 10/22/2023    9:26 AM 07/06/2023    8:40 AM 12/15/2022   10:30 AM  CBC  WBC 3.8 - 10.8 Thousand/uL 8.3  7.4  9.8   Hemoglobin 11.7 - 15.5 g/dL 85.9  85.9  86.9   Hematocrit 35.0 - 45.0 % 43.2  43.1  39.7   Platelets 140 - 400 Thousand/uL 323  319  397       Latest Ref Rng & Units 10/22/2023    9:26 AM 07/06/2023    8:40 AM 12/15/2022   10:30 AM  CMP  Glucose 65 - 99 mg/dL 94  88  99   BUN 7 - 25 mg/dL 10  8  7    Creatinine 0.50 - 0.96 mg/dL 9.33  9.25  9.30   Sodium 135 - 146 mmol/L 138  136  138   Potassium 3.5 - 5.3 mmol/L 4.4  4.5  4.3   Chloride 98 - 110 mmol/L 105  105  106   CO2 20 - 32 mmol/L 25  25  24    Calcium 8.6 - 10.2 mg/dL 9.3  9.2  9.2   Total Protein 6.1 - 8.1 g/dL 6.7   7.6   Total Bilirubin 0.2 - 1.2 mg/dL 1.5   1.2   Alkaline Phos 38 - 126 U/L   89   AST 10 - 30 U/L 14   19   ALT 6 - 29 U/L 10   19       Component Ref Range & Units (hover) 1 yr ago  Factor VIII Activity 112   Von Willebrand Ag 167  Von Willebrand Factor 93        Component Ref Range & Units (hover) 1 yr ago  Interpretation Note  Comment: (NOTE) ------------------------------- COAGULATION: VON WILLEBRAND FACTOR ASSESSMENT CURRENT RESULTS ASSESSMENT The VWF:Ag is elevated. The VWF:RCo is elevated. The FVIII is elevated. VON WILLEBRAND FACTOR ASSESSMENT CURRENT RESULTS INTERPRETATION - These results are not consistent with a diagnosis of VWD according to the current NHLBI guideline. Persistently elevated FVIII activity is a risk factor for venous thrombosis as well as recurrence of venous thrombosis. Risk is graded and increases with the degree of elevation. Although elevated FVIII activity has been identified to cluster within families, a genetic basis for the elevation has not yet been elucidated (Br J Haematol. 2012; 157(6):653-663). VON WILLEBRAND FACTOR ASSESSMENT - VWF and FVIII may elevate as compared to baseline in pregnancy, in samples drawn from patients (particularly children) who are visibly stressed at the time of phlebotomy, as acute phase reactants, or in response to certain drug therapies such as desmopressin and estrogen. Repeat testing may be necessary before excluding a diagnosis of VWD especially if the clinical suspicion is high for  an underlying bleeding disorder. The setting for phlebotomy should be as calm as possible and patients should be encouraged to sit quietly prior to the blood draw. VON WILLEBRAND FACTOR ASSESSMENT CUMULATIVE RESULTS SUMMARY - Interpretation is based on current and most recent VWF Assessment profiles performed at [facility]. Results on two occasions are not consistent with a laboratory diagnosis of VWD. If there is a high clinical suspicion of VWD and other causes of bleeding have been ruled out, consider testing again. Particular attention should be paid to conditions of specimen collection to avoid acute phase elevations of  VWF. VON WILLEBRAND FACTOR ASSESSMENT DEFINITIONS - VWD - von Willebrand disease; VWF - von Willebrand factor; VWF:Ag - VWF antigen; VWF:RCo - VWF ristocetin cofactor activity; FVIII - factor VIII activity. MEDICAL DIRECTOR: For questions regarding panel interpretation, please contact Redell Deems, M.D. at [facility] at [phone].    Results Von Willebrand panel (Order [id number]) MyChart Results Release  MyChart Status: Active  Results Release       View Follow-Up Encounter  important suggestion  Newer results are available. Click to view them now.      Component Ref Range & Units (hover) 2 yr ago  Coagulation Factor VIII 128  Ristocetin Co-factor, Plasma 117  Comment: (NOTE) Performed At: [facility] [address] [place], [place] [id number] Jennette Shorter MD Ey:[id number]  Von Willebrand Antigen, Plasma 138  Comment: (NOTE) This test was developed and its performance characteristics determined by [facility]. It has not been cleared or approved by the Food and Drug Administration.     Assessment and plan:  Von Willebrand's disease:  Reviewed all blood work with Dr. Jacobo including von willebrand panel while elevated and post partum normal von willebrands panel.  The elevated von willebrands panel during pregnancy was likely falsely elevated because of pregnancy.  No significant bleeding events.  Normal childbirth without post partum hemorrhage.  Hg wnl.  Does not appear to have von willebrands disease no need to follow up unless new hemological concerns arise.   Morna Husband AGNP-C 04/12/24

## 2024-04-21 DIAGNOSIS — D6800 Von Willebrand disease, unspecified: Secondary | ICD-10-CM | POA: Diagnosis not present

## 2024-04-21 DIAGNOSIS — Z308 Encounter for other contraceptive management: Secondary | ICD-10-CM | POA: Diagnosis not present

## 2024-04-24 ENCOUNTER — Ambulatory Visit: Admitting: Internal Medicine

## 2024-04-27 ENCOUNTER — Other Ambulatory Visit: Payer: Self-pay | Admitting: Nurse Practitioner

## 2024-04-27 DIAGNOSIS — F410 Panic disorder [episodic paroxysmal anxiety] without agoraphobia: Secondary | ICD-10-CM

## 2024-04-28 ENCOUNTER — Other Ambulatory Visit: Payer: Self-pay

## 2024-04-28 ENCOUNTER — Ambulatory Visit: Admitting: Nurse Practitioner

## 2024-04-28 ENCOUNTER — Encounter: Payer: Self-pay | Admitting: Nurse Practitioner

## 2024-04-28 VITALS — BP 118/80 | HR 100 | Temp 98.5°F | Resp 16 | Ht 60.0 in | Wt 179.5 lb

## 2024-04-28 DIAGNOSIS — F410 Panic disorder [episodic paroxysmal anxiety] without agoraphobia: Secondary | ICD-10-CM | POA: Diagnosis not present

## 2024-04-28 DIAGNOSIS — Z6836 Body mass index (BMI) 36.0-36.9, adult: Secondary | ICD-10-CM | POA: Diagnosis not present

## 2024-04-28 DIAGNOSIS — E66812 Obesity, class 2: Secondary | ICD-10-CM

## 2024-04-28 MED ORDER — WEGOVY 0.25 MG/0.5ML ~~LOC~~ SOAJ
0.2500 mg | SUBCUTANEOUS | 0 refills | Status: DC
  Filled 2024-04-28: qty 2, 28d supply, fill #0

## 2024-04-28 MED ORDER — DULOXETINE HCL 30 MG PO CPEP
30.0000 mg | ORAL_CAPSULE | Freq: Every day | ORAL | 1 refills | Status: AC
  Filled 2024-04-28: qty 90, 90d supply, fill #0

## 2024-04-28 NOTE — Progress Notes (Signed)
 "  BP 118/80 (Cuff Size: Large)   Pulse 100   Temp 98.5 F (36.9 C) (Oral)   Resp 16   Ht 5' (1.524 m)   Wt 179 lb 8 oz (81.4 kg)   SpO2 99%   BMI 35.06 kg/m    Subjective:    Patient ID: Tamara Mcclain, female    DOB: 2000-06-29, 23 y.o.   MRN: [id number]  HPI: Tamara Mcclain is a 23 y.o. female  Chief Complaint  Patient presents with   Follow-up   Discussed the use of AI scribe software for clinical note transcription with the patient, who gave verbal consent to proceed.  History of Present Illness Tamara Mcclain is a 23 year old female with anxiety who presents for a four-week follow-up.  Anxiety - Currently treated with Cymbalta  30 mg daily. - Previously treated with Lexapro , which was ineffective. - Experiences nausea in the early afternoon, managed with Zofran .  Weight management - Previous weight 177 pounds with BMI of 34.82. - Tends to eat when bored, even if not hungry. - No family history of thyroid cancer. - No personal history of pancreatitis.  Wt Readings from Last 3 Encounters:  04/28/24 179 lb 8 oz (81.4 kg)  04/12/24 177 lb (80.3 kg)  03/29/24 178 lb 4.8 oz (80.9 kg)   Body mass index is 35.06 kg/m.           04/28/2024    8:02 AM 04/12/2024    9:04 AM 03/29/2024    7:40 AM  Depression screen PHQ 2/9  Decreased Interest 0 0 0  Down, Depressed, Hopeless 2 0 0  PHQ - 2 Score 2 0 0  Altered sleeping 0 0 0  Tired, decreased energy 2 0 0  Change in appetite 1 0 0  Feeling bad or failure about yourself  0 0 0  Trouble concentrating 0 0 0  Moving slowly or fidgety/restless 0 0 0  Suicidal thoughts 0 0 0  PHQ-9 Score 5 0 0  Difficult doing work/chores Somewhat difficult  Not difficult at all       04/28/2024    8:02 AM 03/29/2024    7:40 AM 01/26/2024    9:04 AM 01/20/2024    8:44 AM  GAD 7 : Generalized Anxiety Score  Nervous, Anxious, on Edge 3 1 0 2  Control/stop worrying 1 1 0 1  Worry too much - different things 1  1 0 2  Trouble relaxing 1 0 0 0  Restless 0 0 0 0  Easily annoyed or irritable 2 1 0 1  Afraid - awful might happen 0 1 0 0  Total GAD 7 Score 8 5 0 6  Anxiety Difficulty  Somewhat difficult Not difficult at all Not difficult at all     Relevant past medical, surgical, family and social history reviewed and updated as indicated. Interim medical history since our last visit reviewed. Allergies and medications reviewed and updated.  Review of Systems  Constitutional: Negative for fever or weight change.  Respiratory: Negative for cough and shortness of breath.   Cardiovascular: Negative for chest pain or palpitations.  Gastrointestinal: Negative for abdominal pain, no bowel changes.  Musculoskeletal: Negative for gait problem or joint swelling.  Skin: Negative for rash.  Neurological: Negative for dizziness or headache.  No other specific complaints in a complete review of systems (except as listed in HPI above).      Objective:      BP 118/80 (Cuff  Size: Large)   Pulse 100   Temp 98.5 F (36.9 C) (Oral)   Resp 16   Ht 5' (1.524 m)   Wt 179 lb 8 oz (81.4 kg)   SpO2 99%   BMI 35.06 kg/m    Wt Readings from Last 3 Encounters:  04/28/24 179 lb 8 oz (81.4 kg)  04/12/24 177 lb (80.3 kg)  03/29/24 178 lb 4.8 oz (80.9 kg)    Physical Exam MEASUREMENTS: Weight- 177, BMI- 34.82. GENERAL: Alert, cooperative, well developed, no acute distress HEENT: Normocephalic, normal oropharynx, moist mucous membranes CHEST: Clear to auscultation bilaterally, No wheezes, rhonchi, or crackles CARDIOVASCULAR: Normal heart rate and rhythm, S1 and S2 normal without murmurs ABDOMEN: Soft, non-tender, non-distended, without organomegaly, Normal bowel sounds EXTREMITIES: No cyanosis or edema NEUROLOGICAL: Cranial nerves grossly intact, Moves all extremities without gross motor or sensory deficit  Results for orders placed or performed in visit on 01/26/24  POCT rapid strep A   Collection Time:  01/26/24  8:38 AM  Result Value Ref Range   Rapid Strep A Screen Positive (A) Negative          Assessment & Plan:   Problem List Items Addressed This Visit       Other   Panic attacks   Relevant Medications   DULoxetine  (CYMBALTA ) 30 MG capsule   Class 2 obesity due to excess calories with body mass index (BMI) of 36.0 to 36.9 in adult - Primary   Relevant Medications   WEGOVY 0.25 MG/0.5ML SOAJ SQ injection     Assessment and Plan Assessment & Plan Panic disorder Managed with Cymbalta  30 mg daily. Reports improvement in symptoms but experiences nausea, managed with Zofran . No longer on Lexapro  due to ineffectiveness. - Continue Cymbalta  30 mg daily - Refilled Cymbalta  prescription  Class 2 obesity BMI of 34.82. Interested in weight loss management and has completed dietary and exercise recommendations. Discussed GLP-1 receptor agonists (Wegovy, Ozempic, Mounjaro, Zepbound) for weight loss. No family history of thyroid cancer or personal history of pancreatitis. Informed about potential side effects including nausea, abdominal pain, constipation, and fatigue. Discussed injection sites and dose escalation plan. Oral Wegovy expected to be more affordable in January. - Initiated GLP-1 receptor agonist therapy (Wegovy or Ozempic) - Provided samples if available - Follow up every three months to monitor progress and adjust dosage as needed - Advised on injection sites and dose escalation plan - Discussed potential for oral Wegovy in January if current options are not affordable  - Encourage continuation of lifestyle modifications, including dietary management and regular exercise. -continue to increase physical activity, getting at least 150 min of physical activity a week.  Work on including runner, broadcasting/film/video 2 days a week.  - continue eating at a calorie deficit 1600-1700 cal a day, eating a well balanced diet with whole foods, avoiding processed foods.   Patient is motivated  to continue working on lifestyle modification.         Follow up plan: No follow-ups on file. "

## 2024-05-01 NOTE — Telephone Encounter (Signed)
 Duplicate request.  Requested Prescriptions  Pending Prescriptions Disp Refills   DULoxetine  (CYMBALTA ) 30 MG capsule [Pharmacy Med Name: DULOXETINE  HCL DR 30 MG CAP] 90 capsule 1    Sig: TAKE 1 CAPSULE BY MOUTH EVERY DAY     Psychiatry: Antidepressants - SNRI - duloxetine  Passed - 05/01/2024 12:37 PM      Passed - Cr in normal range and within 360 days    Creat  Date Value Ref Range Status  10/22/2023 0.66 0.50 - 0.96 mg/dL Final         Passed - eGFR is 30 or above and within 360 days    GFR calc Af Amer  Date Value Ref Range Status  04/09/2018 >60 >60 mL/min Final   GFR, Estimated  Date Value Ref Range Status  07/06/2023 >60 >60 mL/min Final    Comment:    (NOTE) Calculated using the CKD-EPI Creatinine Equation (2021)    GFR  Date Value Ref Range Status  10/24/2019 98.24 >60.00 mL/min Final   eGFR  Date Value Ref Range Status  10/22/2023 126 > OR = 60 mL/min/1.46m2 Final  01/28/2022 107 >59 mL/min/1.73 Final         Passed - Completed PHQ-2 or PHQ-9 in the last 360 days      Passed - Last BP in normal range    BP Readings from Last 1 Encounters:  04/28/24 118/80         Passed - Valid encounter within last 6 months    Recent Outpatient Visits           3 days ago Class 2 severe obesity due to excess calories with serious comorbidity and body mass index (BMI) of 36.0 to 36.9 in adult   [facility] [name], FNP   1 month ago Mild intermittent asthma without complication   [facility] [name], FNP   1 month ago GAD (generalized anxiety disorder)   [facility] [place], Angeline ORN, NP   3 months ago Strep pharyngitis   [place] [place], Angeline ORN, NP   3 months ago Epiploic appendagitis   [place] [facility] [place], Angeline ORN, [place]

## 2024-05-05 ENCOUNTER — Encounter: Payer: Self-pay | Admitting: Nurse Practitioner

## 2024-05-09 ENCOUNTER — Other Ambulatory Visit: Payer: Self-pay

## 2024-05-10 ENCOUNTER — Encounter: Admitting: Nurse Practitioner

## 2024-05-10 ENCOUNTER — Other Ambulatory Visit: Payer: Self-pay

## 2024-05-10 ENCOUNTER — Encounter: Payer: Self-pay | Admitting: Nurse Practitioner

## 2024-05-10 VITALS — BP 118/72 | HR 91 | Temp 98.2°F | Resp 16 | Ht 60.0 in | Wt 179.3 lb

## 2024-05-10 DIAGNOSIS — E785 Hyperlipidemia, unspecified: Secondary | ICD-10-CM | POA: Diagnosis not present

## 2024-05-10 DIAGNOSIS — Z0000 Encounter for general adult medical examination without abnormal findings: Secondary | ICD-10-CM | POA: Insufficient documentation

## 2024-05-10 DIAGNOSIS — Z130 Encounter for screening for diseases of the blood and blood-forming organs and certain disorders involving the immune mechanism: Secondary | ICD-10-CM

## 2024-05-10 DIAGNOSIS — Z131 Encounter for screening for diabetes mellitus: Secondary | ICD-10-CM

## 2024-05-10 NOTE — Progress Notes (Signed)
 Name: Tamara Mcclain   MRN: [id number]    DOB: 07-May-2001   Date:05/10/2024       Progress Note  Subjective  Chief Complaint  Chief Complaint  Patient presents with   Annual Exam    HPI  Patient presents for annual CPE. Discussed the use of AI scribe software for clinical note transcription with the patient, who gave verbal consent to proceed.  History of Present Illness Tamara Mcclain is a 23 year old female who presents for an annual physical exam.  Abdominal pain - Mild abdominal pain associated with medication use - Pain is described as 'nothing bad' and only mildly noticeable  Sensory changes related to herniated disc - History of herniated disc - Sensation differences present on left side  Hyperlipidemia - Recent lipid panel showed LDL of 108 mg/dL  Diet and exercise - Follows a regular diet - Exercises two days per week for approximately thirty minutes per session  Depression screening - Depression screening was negative  Gynecologic and sexual health - Sexually active - Uses an intrauterine device (IUD) for contraception - No issues with incontinence - No experience of violence at home  Laboratory findings - A1c was normal as of June 2025 - Comprehensive metabolic panel (CMP) and complete blood count (CBC) were normal    Diet: Regular Exercise: 2 days 30 minutes  Last Eye Exam: completed Last Dental Exam: completed  Flowsheet Row Office Visit from 05/10/2024 in [facility]  AUDIT-C Score 0   Depression: Phq 9 is  negative    04/28/2024    8:02 AM 04/12/2024    9:04 AM 03/29/2024    7:40 AM 01/26/2024    9:04 AM 01/20/2024    8:44 AM  Depression screen PHQ 2/9  Decreased Interest 0 0 0 0 0  Down, Depressed, Hopeless 2 0 0 0 0  PHQ - 2 Score 2 0 0 0 0  Altered sleeping 0 0 0 0 0  Tired, decreased energy 2 0 0 0 1  Change in appetite 1 0 0 0 0  Feeling bad or failure about yourself  0 0 0 0 0  Trouble  concentrating 0 0 0 0 0  Moving slowly or fidgety/restless 0 0 0 0 0  Suicidal thoughts 0 0 0 0 0  PHQ-9 Score 5 0 0 0  1   Difficult doing work/chores Somewhat difficult  Not difficult at all Not difficult at all Not difficult at all     Data saved with a previous flowsheet row definition   Hypertension: BP Readings from Last 3 Encounters:  05/10/24 118/72  04/28/24 118/80  04/12/24 129/89   Obesity: Wt Readings from Last 3 Encounters:  05/10/24 179 lb 4.8 oz (81.3 kg)  04/28/24 179 lb 8 oz (81.4 kg)  04/12/24 177 lb (80.3 kg)   BMI Readings from Last 3 Encounters:  05/10/24 35.02 kg/m  04/28/24 35.06 kg/m  04/12/24 34.57 kg/m     Vaccines: reviewed with the patient.   Hep C Screening: completed STD testing and prevention (HIV/chl/gon/syphilis): completed Intimate partner violence: negative screen  Sexual History :active Menstrual History/LMP/Abnormal Bleeding: IUD Discussed importance of follow up if any post-menopausal bleeding: not applicable  Incontinence Symptoms: negative for symptoms   Breast cancer:  - Last Mammogram: Na - BRCA gene screening:   Osteoporosis Prevention : Discussed high calcium and vitamin D supplementation, weight bearing exercises Bone density :not applicable   Cervical cancer screening: up-to-date  Skin  cancer: Discussed monitoring for atypical lesions  Colorectal cancer: NA   Lung cancer:  Low Dose CT Chest recommended if Age 78-80 years, 20 pack-year currently smoking OR have quit w/in 15years. Patient does not qualify for screen   ECG: 03/18/2023  Advanced Care Planning: A voluntary discussion about advance care planning including the explanation and discussion of advance directives.  Discussed health care proxy and Living will, and the patient was able to identify a health care proxy.  Patient does not have a living will and power of attorney of health care   Patient Active Problem List   Diagnosis Date Noted   Annual physical  exam 05/10/2024   Hyperlipidemia 05/10/2024   Lumbar radiculopathy 03/17/2023   GAD (generalized anxiety disorder) 10/27/2022   Pure hypercholesterolemia 10/27/2022   Asthma 02/10/2022   Class 2 obesity due to excess calories with body mass index (BMI) of 36.0 to 36.9 in adult 11/10/2021   Panic attacks 12/05/2019   Epiploic appendagitis 06/28/2019   Von Willebrand disease (HCC) 08/18/2016    Past Surgical History:  Procedure Laterality Date   ESOPHAGOGASTRODUODENOSCOPY (EGD) WITH PROPOFOL  N/A 01/30/2020   Procedure: ESOPHAGOGASTRODUODENOSCOPY (EGD) WITH PROPOFOL ;  Surgeon: Therisa Bi, MD;  Location: [facility] ENDOSCOPY;  Service: Gastroenterology;  Laterality: N/A;   FORAMINOTOMY 1 LEVEL Left 07/12/2023   Procedure: LEFT L5-S1 LAMINOFORAMINOTOMY;  Surgeon: Clois Fret, MD;  Location: [facility] ORS;  Service: Neurosurgery;  Laterality: Left;   LUMBAR LAMINECTOMY/DECOMPRESSION MICRODISCECTOMY Right 03/17/2023   Procedure: RIGHT L5-S1 MICRODISCECTOMY;  Surgeon: Clois Fret, MD;  Location: [facility] ORS;  Service: Neurosurgery;  Laterality: Right;   SPINE SURGERY     WISDOM TOOTH EXTRACTION  08/2018   no bleeding complications    Family History  Problem Relation Age of Onset   Hypertension Father    Pulmonary embolism Father    Anxiety disorder Father    Cancer Maternal Grandmother 33       breast   Arthritis Maternal Grandmother    Hyperlipidemia Maternal Grandmother    CAD Maternal Grandfather    Cancer Paternal Grandmother        melanoma, sinus cancer   Colon polyps Paternal Grandmother    Heart disease Paternal Grandfather    Gaucher's disease Half-Brother    Birth defects Brother    Birth defects Brother    Esophageal cancer Neg Hx    Stomach cancer Neg Hx    Liver disease Neg Hx     Social History   Socioeconomic History   Marital status: Married    Spouse name: Elbert Polyakov.   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  Other Topics Concern   Not on file  Social History Narrative   CMA at [employer]   Social Drivers of Health   Tobacco Use: Low Risk (05/10/2024)   Patient History    Smoking Tobacco Use: Never    Smokeless Tobacco Use: Never    Passive Exposure: Past  Financial Resource Strain: Low Risk (03/09/2024)   Overall Financial Resource Strain (CARDIA)    Difficulty of Paying Living Expenses: Not hard at all  Food Insecurity: No Food Insecurity (05/10/2024)   Epic    Worried About Programme Researcher, Broadcasting/film/video in the Last Year: Never true    Ran Out of Food in the Last Year: Never true  Transportation Needs: No Transportation Needs (05/10/2024)   Epic    Lack of Transportation (Medical): No    Lack of Transportation (Non-Medical): No  Physical Activity: Insufficiently Active (03/09/2024)   Exercise Vital Sign    Days of Exercise per Week: 2 days    Minutes of Exercise per Session: 10 min  Stress: Stress Concern Present (03/09/2024)   [employer] of Occupational Health - Occupational Stress Questionnaire    Feeling of Stress: To some extent  Social Connections: Moderately Isolated (03/09/2024)   Social Connection and Isolation Panel    Frequency of Communication with Friends and Family: More than three times a week    Frequency of Social Gatherings with Friends and Family: Once a week    Attends Religious Services: Never    Database Administrator or Organizations: No    Attends Engineer, Structural: Not on file    Marital Status: Married  Catering Manager  Violence: Not At Risk (05/10/2024)   Epic    Fear of Current or Ex-Partner: No    Emotionally Abused: No    Physically Abused: No    Sexually Abused: No  Depression (PHQ2-9): Medium Risk (04/28/2024)   Depression (PHQ2-9)    PHQ-2 Score: 5  Alcohol Screen: Low Risk (05/10/2024)   Alcohol Screen    Last Alcohol Screening Score (AUDIT): 0  Housing: Unknown (05/10/2024)   Epic    Unable to Pay for Housing in the Last Year: No    Number of Times Moved in the Last Year: Not on file    Homeless in the Last Year: No  Utilities: Not At Risk (05/10/2024)   Epic    Threatened with loss of utilities: No  Health Literacy: Adequate Health Literacy (05/10/2024)   B1300 Health Literacy    Frequency of need for help with medical instructions: Never    Current Medications[1]  Allergies[2]   ROS  Constitutional: Negative for fever or weight change.  Respiratory: Negative for cough and shortness of breath.   Cardiovascular: Negative for chest pain or palpitations.  Gastrointestinal: Negative for abdominal pain, no bowel changes.  Musculoskeletal: Negative for gait problem or joint swelling.  Skin: Negative for rash.  Neurological: Negative for dizziness or headache.  No other specific complaints in a complete review of systems (except as listed in HPI above).   Objective  Vitals:   05/10/24 0737  BP: 118/72  Pulse: 91  Resp: 16  Temp: 98.2 F (36.8 C)  TempSrc: Oral  SpO2: 98%  Weight: 179 lb 4.8 oz (81.3 kg)  Height: 5' (1.524 m)    Body mass index is 35.02 kg/m.  Physical Exam Vitals reviewed.  Constitutional:      Appearance: Normal appearance.  HENT:     Head: Normocephalic.     Right Ear: Tympanic membrane normal.     Left Ear: Tympanic membrane normal.     Nose: Nose normal.  Eyes:     Extraocular Movements: Extraocular movements intact.     Conjunctiva/sclera: Conjunctivae normal.     Pupils: Pupils are equal, round, and reactive to light.  Neck:  Thyroid: No thyroid mass, thyromegaly or thyroid tenderness.  Cardiovascular:     Rate and Rhythm: Normal rate and regular rhythm.     Pulses: Normal pulses.     Heart sounds: Normal heart sounds.  Pulmonary:     Effort: Pulmonary effort is normal.     Breath sounds: Normal breath sounds.  Abdominal:     General: Bowel sounds are normal.     Palpations: Abdomen is soft.  Musculoskeletal:        General: Normal range of motion.     Cervical back: Normal range of motion and neck supple.     Right lower leg: No edema.     Left lower leg: No edema.  Skin:    General: Skin is warm and dry.     Capillary Refill: Capillary refill takes less than 2 seconds.  Neurological:     General: No focal deficit present.     Mental Status: She is alert and oriented to person, place, and time. Mental status is at baseline.  Psychiatric:        Mood and Affect: Mood normal.        Behavior: Behavior normal.        Thought Content: Thought content normal.        Judgment: Judgment normal.        Assessment & Plan  Problem List Items Addressed This Visit       Other   Annual physical exam - Primary   Relevant Orders   CBC with Differential/Platelet   Comprehensive metabolic panel with GFR   Lipid panel   Hemoglobin A1c   Hyperlipidemia   Relevant Orders   Lipid panel   Other Visit Diagnoses       Screening for diabetes mellitus       Relevant Orders   Comprehensive metabolic panel with GFR   Hemoglobin A1c     Screening for deficiency anemia       Relevant Orders   CBC with Differential/Platelet      Assessment and Plan Assessment & Plan Hyperlipidemia LDL level of 108 mg/dL. No current symptoms or side effects reported from previous treatment.  General Health Maintenance Annual physical examination conducted. Pap smear is up to date. Lipid panel showed hyperlipidemia. A1c was normal in June 2025. CMP and CBC were normal. Depression screening negative. Blood pressure is  good. Weight is stable. Cervical cancer screening is up to date. Does not qualify for mammogram or colorectal cancer screening yet. Sexually active with no violence at home. No incontinence. IUD in place. - Recommended 150 minutes of physical activity per week. - Continue current diet and exercise regimen.    -USPSTF grade A and B recommendations reviewed with patient; age-appropriate recommendations, preventive care, screening tests, etc discussed and encouraged; healthy living encouraged; see AVS for patient education given to patient -Discussed importance of 150 minutes of physical activity weekly, eat two servings of fish weekly, eat one serving of tree nuts ( cashews, pistachios, pecans, almonds.SABRA) every other day, eat 6 servings of fruit/vegetables daily and drink plenty of water and avoid sweet beverages.   -Reviewed Health Maintenance: Yes.       [1]  Current Outpatient Medications:    albuterol  (VENTOLIN  HFA) 108 (90 Base) MCG/ACT inhaler, Inhale 1-2 puffs into the lungs every 6 (six) hours as needed for wheezing or shortness of breath., Disp: 8 g, Rfl: 2   busPIRone  (BUSPAR ) 10 MG tablet, Take 1 tablet (10 mg total) by mouth  2 (two) times daily., Disp: 180 tablet, Rfl: 0   DULoxetine  (CYMBALTA ) 30 MG capsule, Take 1 capsule (30 mg total) by mouth daily., Disp: 90 capsule, Rfl: 1   gabapentin  (NEURONTIN ) 300 MG capsule, Take 1 capsule (300 mg total) by mouth 3 (three) times daily. Start 1 pill at night time for 1 week, then add a morning dose for a week, then go to 3 times per day, Disp: 90 capsule, Rfl: 0   ibuprofen  (ADVIL ) 200 MG tablet, Take 400-600 mg by mouth every 8 (eight) hours as needed (pain.)., Disp: , Rfl:    methocarbamol  (ROBAXIN ) 500 MG tablet, Take 1 tablet (500 mg total) by mouth every 6 (six) hours as needed for muscle spasms., Disp: 120 tablet, Rfl: 0   ondansetron  (ZOFRAN -ODT) 4 MG disintegrating tablet, Take 1 tablet (4 mg total) by mouth every 8 (eight) hours as  needed for nausea or vomiting., Disp: 30 tablet, Rfl: 1   WEGOVY  0.25 MG/0.5ML SOAJ SQ injection, Inject 0.25 mg into the skin once a week. Use this dose for 1 month (4 shots) and then increase to next higher dose., Disp: 2 mL, Rfl: 0   levonorgestrel  (MIRENA ) 20 MCG/DAY IUD, 1 each by Intrauterine route once., Disp: , Rfl:  [2]  Allergies Allergen Reactions   Tylenol  [Acetaminophen ] Other (See Comments)    fever   Depo-Medrol  [Methylprednisolone ] Nausea Only    Feeling flushed

## 2024-05-11 LAB — CBC WITH DIFFERENTIAL/PLATELET
Absolute Lymphocytes: 2120 {cells}/uL (ref 850–3900)
Absolute Monocytes: 464 {cells}/uL (ref 200–950)
Basophils Absolute: 40 {cells}/uL (ref 0–200)
Basophils Relative: 0.5 %
Eosinophils Absolute: 80 {cells}/uL (ref 15–500)
Eosinophils Relative: 1 %
HCT: 41.2 % (ref 35.9–46.0)
Hemoglobin: 13.6 g/dL (ref 11.7–15.5)
MCH: 27.8 pg (ref 27.0–33.0)
MCHC: 33 g/dL (ref 31.6–35.4)
MCV: 84.1 fL (ref 81.4–101.7)
MPV: 10.8 fL (ref 7.5–12.5)
Monocytes Relative: 5.8 %
Neutro Abs: 5296 {cells}/uL (ref 1500–7800)
Neutrophils Relative %: 66.2 %
Platelets: 326 Thousand/uL (ref 140–400)
RBC: 4.9 Million/uL (ref 3.80–5.10)
RDW: 12.9 % (ref 11.0–15.0)
Total Lymphocyte: 26.5 %
WBC: 8 Thousand/uL (ref 3.8–10.8)

## 2024-05-11 LAB — COMPREHENSIVE METABOLIC PANEL WITH GFR
AG Ratio: 1.5 (calc) (ref 1.0–2.5)
ALT: 9 U/L (ref 6–29)
AST: 12 U/L (ref 10–30)
Albumin: 4 g/dL (ref 3.6–5.1)
Alkaline phosphatase (APISO): 84 U/L (ref 31–125)
BUN: 9 mg/dL (ref 7–25)
CO2: 27 mmol/L (ref 20–32)
Calcium: 8.7 mg/dL (ref 8.6–10.2)
Chloride: 106 mmol/L (ref 98–110)
Creat: 0.61 mg/dL (ref 0.50–0.96)
Globulin: 2.6 g/dL (ref 1.9–3.7)
Glucose, Bld: 86 mg/dL (ref 65–99)
Potassium: 4.2 mmol/L (ref 3.5–5.3)
Sodium: 138 mmol/L (ref 135–146)
Total Bilirubin: 1.4 mg/dL — ABNORMAL HIGH (ref 0.2–1.2)
Total Protein: 6.6 g/dL (ref 6.1–8.1)
eGFR: 128 mL/min/1.73m2

## 2024-05-11 LAB — HEMOGLOBIN A1C
Hgb A1c MFr Bld: 5 %
Mean Plasma Glucose: 97 mg/dL
eAG (mmol/L): 5.4 mmol/L

## 2024-05-11 LAB — LIPID PANEL
Cholesterol: 126 mg/dL
HDL: 35 mg/dL — ABNORMAL LOW
LDL Cholesterol (Calc): 77 mg/dL
Non-HDL Cholesterol (Calc): 91 mg/dL
Total CHOL/HDL Ratio: 3.6 (calc)
Triglycerides: 59 mg/dL

## 2024-05-12 ENCOUNTER — Ambulatory Visit: Admitting: Nurse Practitioner

## 2024-05-12 ENCOUNTER — Ambulatory Visit: Payer: Self-pay | Admitting: Nurse Practitioner

## 2024-05-16 ENCOUNTER — Encounter: Payer: Self-pay | Admitting: Physician Assistant

## 2024-05-16 ENCOUNTER — Other Ambulatory Visit: Payer: Self-pay

## 2024-05-16 ENCOUNTER — Ambulatory Visit: Admitting: Physician Assistant

## 2024-05-16 VITALS — BP 122/88 | Wt 175.2 lb

## 2024-05-16 DIAGNOSIS — M5116 Intervertebral disc disorders with radiculopathy, lumbar region: Secondary | ICD-10-CM | POA: Diagnosis not present

## 2024-05-16 DIAGNOSIS — M5126 Other intervertebral disc displacement, lumbar region: Secondary | ICD-10-CM

## 2024-05-16 MED ORDER — OXYCODONE HCL 5 MG PO TABS
5.0000 mg | ORAL_TABLET | Freq: Three times a day (TID) | ORAL | 0 refills | Status: AC | PRN
  Filled 2024-05-16: qty 10, 4d supply, fill #0

## 2024-05-16 MED ORDER — GABAPENTIN 100 MG PO CAPS
100.0000 mg | ORAL_CAPSULE | Freq: Three times a day (TID) | ORAL | 2 refills | Status: AC
  Filled 2024-05-16: qty 90, 30d supply, fill #0

## 2024-05-16 NOTE — Progress Notes (Signed)
 "  REFERRING PHYSICIAN:  Antonette Angeline ORN, N[address] [place],  [place] [address]  DOS: 07/12/23 Left L5-S1 laminoforaminotomy  DOS: 03/17/2023 (R L5/S1 microdiscectomy)   HISTORY OF PRESENT ILLNESS:  05/16/2024 Patient comes in today with significant pain exacerbated by twisting movement approximately 3 days ago.  She is having severe pain in her low back radiating down her left leg to her ankle and at times her foot.  She is having difficulty walking and has been taking Robaxin  and oxycodone  to help with her pain.  Unfortunately she has been unable to tolerate physical therapy secondary to her pain.  She feels that her weakness is worse.    12/02/2023 She continues to have left leg pain.  She has had some difficult changes to her living situation.  She has been back at home.  She is not ready to consider any surgical intervention.  She did see physical therapy and is doing home exercises.  10/26/2023 Ms. Ludvigsen presents with left greater than right leg numbness.  She was doing well when I last saw her.  Over the past several weeks, she developed right sided leg numbness and then began having worse left-sided leg numbness on June 10.  She is having numbness on the front, side, and back of her leg.  It extends down to the top of her foot.  The bottom of her foot feels normal.  She denies weakness.  She is able to feel her private area and has no bowel or bladder concerns.  Her mom reports that she walks abnormally.    PHYSICAL EXAMINATION:  General: Patient is well developed, well nourished, calm, collected, and in no apparent distress.  Awake, alert, oriented to person, place, and time.  Speech is clear and fluent. Fund of knowledge is appropriate.    Cranial Nerves: Pupils equal round and reactive to light.     No posterior cervical tenderness. Mild tenderness in right trapezial region. Mild periscapular tenderness on right > left.   Well healed lumbar incision. No tenderness noted.    No  abnormal lesions on exposed skin.    Strength:   Positive straight leg raise of left lower extremities.  She has full strength approximately in both lower extremities with the exception of a 4/5 in left hamstring.  Patient has quite a bit of difficulty with full extension of the left leg.  Difficult to test dorsiflexion, plantarflexion, or clonus in the left lower extremity as a result secondary to significant pain.  She does have active range of motion of her toes and some movement in her ankle, but cannot fully test strength at this time.  She does have decreased sensation in her left lower extremity in the L5-S1 distribution.     Gait is abnormal.  Patient walks with a limp.    ROS (Neurologic):  Negative except as noted above  IMAGING: MRI L spine 10/17/2023 IMPRESSION: 1. Mildly transitional lumbosacral anatomy with vestigial S1-S2 disc space, the same numbering system used on February MRI. Correlation with radiographs is recommended prior to any operative intervention.   2. Postoperative changes at L5-S1 with ongoing broad-based disc protrusion into the left lateral recess, appears smaller when compared to February, with improved spinal canal and right lateral recess patency there. 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   3. Otherwise stable lumbar spine, mild degeneration elsewhere without stenosis.     Electronically Signed   By: VEAR Hurst  M.D.   On: 10/17/2023 08:38    ASSESSMENT/PLAN:  Aleicia Ellen Callejo has symptoms of ongoing lumbar radiculopathy  She has had a second reherniation.  She has a recent acute exacerbation of her pain 3 days ago when she twisted which resulted in severe pain in the left L5 distribution.  Unfortunately patient is unable to tolerate physical therapy.  She did previously discuss a transforaminal lumbar interbody fusion with Dr. Katrina however she is currently a single parent to her young son  at this time and has no assistance.  Talked her at length about the likely need for surgery due to pain and weakness moving forward, but she states that she is unable to do that at this time and she hopes to get FMLA later this year through her job would like to wait if at all possible.  The risks of this were reiterated.  Patient is unable to undergo lumbar injections or oral steroids.  I discussed with her at length the utility and gabapentin  in which she states that she has previously gotten a prescription for but never tried.  I have asked her to start this at nighttime and then increase her dose as tolerated.  I plan to call her in 2 weeks to see how this is working.  We may increase the dose depending on how she is tolerating the medication.  A short dose of oxycodone  was given her due to severe pain.     Lyle Decamp  Department of neurosurgery    "

## 2024-05-18 ENCOUNTER — Telehealth: Admitting: Physician Assistant

## 2024-05-18 DIAGNOSIS — H9202 Otalgia, left ear: Secondary | ICD-10-CM

## 2024-05-18 MED ORDER — IPRATROPIUM BROMIDE 0.03 % NA SOLN: 2.0000 | Freq: Two times a day (BID) | NASAL | 0 refills | Status: DC

## 2024-05-18 NOTE — Progress Notes (Signed)

## 2024-05-19 ENCOUNTER — Ambulatory Visit (INDEPENDENT_AMBULATORY_CARE_PROVIDER_SITE_OTHER)

## 2024-05-19 ENCOUNTER — Other Ambulatory Visit: Payer: Self-pay

## 2024-05-19 VITALS — BP 127/80 | HR 86 | Temp 97.7°F | Wt 180.7 lb

## 2024-05-19 DIAGNOSIS — H6690 Otitis media, unspecified, unspecified ear: Secondary | ICD-10-CM | POA: Diagnosis not present

## 2024-05-19 MED ORDER — AMOXICILLIN-POT CLAVULANATE 875-125 MG PO TABS
1.0000 | ORAL_TABLET | Freq: Two times a day (BID) | ORAL | 0 refills | Status: AC
  Filled 2024-05-19: qty 14, 7d supply, fill #0

## 2024-05-19 NOTE — Progress Notes (Signed)
" °  ° ° °  Acute visit   Patient: Tamara Mcclain   DOB: [birth year]   23 y.o. Female  MRN: [id number] PCP: [name], FNP   Chief Complaint  Patient presents with   Ear Pain    Left ear Woke up with the pain yesterday morning No swelling noticed and no drainage   Subjective    Discussed the use of AI scribe software for clinical note transcription with the patient, who gave verbal consent to proceed.  History of Present Illness Tamara Mcclain is a 24 year old female who presents with left ear pain.  She woke up yesterday morning with pain in her left ear, initially attributing it to sleeping on her left side. The pain persisted throughout the day and worsened by last night.   No recent cold, but she reports slight congestion, less severe than her usual allergies.  She had an e-visit yesterday but did not receive any treatment as the provider could not examine her ear. She has a history of ear infections, though it has been some time since the last occurrence. No throat pain.  No known allergies to antibiotics and has previously taken amoxicillin  without issues.  Review of systems as noted in HPI.   Objective    BP 127/80   Pulse 86   Temp 97.7 F (36.5 C) (Oral)   Wt 180 lb 11.2 oz (82 kg)   Breastfeeding No   BMI 35.29 kg/m  Physical Exam Constitutional:      Appearance: Normal appearance.  HENT:     Head: Normocephalic and atraumatic.     Right Ear: Tympanic membrane, ear canal and external ear normal.     Left Ear: Tympanic membrane is erythematous and bulging.     Mouth/Throat:     Mouth: Mucous membranes are moist.  Eyes:     Pupils: Pupils are equal, round, and reactive to light.  Pulmonary:     Effort: Pulmonary effort is normal.  Skin:    General: Skin is warm.  Neurological:     General: No focal deficit present.     Mental Status: She is alert.       No results found for any visits on 05/19/24.  Assessment & Plan     Problem  List Items Addressed This Visit   None Visit Diagnoses       Acute otitis media, unspecified otitis media type    -  Primary   Relevant Medications   amoxicillin -clavulanate (AUGMENTIN ) 875-125 MG tablet       Assessment & Plan Acute otitis media, left ear Patient with 2 day hx of L ear pain. Exam notable for erythema and bulging of L TM concerning for acute otitis media. - Prescribed Augmentin  for 7 days, twice daily with food. - Sent prescription to pharmacy. - Return precautions given   Meds ordered this encounter  Medications   amoxicillin -clavulanate (AUGMENTIN ) 875-125 MG tablet    Sig: Take 1 tablet by mouth 2 (two) times daily for 7 days.    Dispense:  14 tablet    Refill:  0     No follow-ups on file.      Isaiah DELENA Pepper, MD  [facility] [phone] (phone) [phone] (fax) "

## 2024-05-30 ENCOUNTER — Ambulatory Visit: Admitting: Physician Assistant

## 2024-05-30 ENCOUNTER — Other Ambulatory Visit: Payer: Self-pay | Admitting: Physician Assistant

## 2024-05-30 DIAGNOSIS — Z9889 Other specified postprocedural states: Secondary | ICD-10-CM

## 2024-05-30 DIAGNOSIS — M5416 Radiculopathy, lumbar region: Secondary | ICD-10-CM | POA: Diagnosis not present

## 2024-05-30 NOTE — Progress Notes (Signed)
" °  DOS: 07/12/23 Left L5-S1 laminoforaminotomy  DOS: 03/17/2023 (R L5/S1 microdiscectomy)   Called patient to discuss her trial of gabapentin .  She states that it made her nauseous and was causing her to throw up.  She states that she does not feel this is related to oxycodone  or her Wegovy .  I discussed with her at length again about how with her not being able to tolerate neuropathic pain medicine, not consistently taking muscle relaxer, due to reasons to be under control not able to undergo PT or surgery at this time there is not much more we can do from our standpoint.  Moving forward, asked that she talk to her PCP about any continuing pain meds I also offered to send in a new pain management referral.  If she were to change her mind about formal physical therapy or surgery, I asked her to please contact us .   This visit was performed via telephone.  Patient location: home Provider location: office  I spent a total of 15 minutes non-face-to-face activities for this visit on the date of this encounter including review of current clinical condition and response to treatment.  The patient is aware of and accepts the limits of this telehealth visit.     "

## 2024-05-31 ENCOUNTER — Ambulatory Visit: Admitting: Physician Assistant

## 2024-05-31 NOTE — Addendum Note (Signed)
 Addended by: ULIS BOTTCHER on: 05/31/2024 10:54 AM   Modules accepted: Level of Service

## 2024-06-06 ENCOUNTER — Telehealth: Admitting: Family

## 2024-06-06 ENCOUNTER — Ambulatory Visit: Payer: Self-pay

## 2024-06-06 DIAGNOSIS — R1114 Bilious vomiting: Secondary | ICD-10-CM

## 2024-06-06 DIAGNOSIS — E66812 Obesity, class 2: Secondary | ICD-10-CM

## 2024-06-06 DIAGNOSIS — Z6836 Body mass index (BMI) 36.0-36.9, adult: Secondary | ICD-10-CM

## 2024-06-06 DIAGNOSIS — K6389 Other specified diseases of intestine: Secondary | ICD-10-CM

## 2024-06-06 MED ORDER — ONDANSETRON 4 MG PO TBDP: 4.0000 mg | ORAL_TABLET | Freq: Three times a day (TID) | ORAL | 0 refills | Status: AC | PRN

## 2024-06-06 NOTE — Assessment & Plan Note (Signed)
 Refill sent for zofran  4 mg prn  Suspect glp 1 related with wegovy  Advised pt to d/c  Follow a gallbladder friendly diet   Monitor for s/s pancreatitis, constipation/obstruction  If sx persist and or worsen then advised to see pcp  If this were to occur could consider cholecytitis, obstruction, gerd, appendicitis although would most likely correlate with additional symptoms.  Do not suspect viral/bacterial due to nature of symptoms   Advised low fodmap diet and to try an elimination diet (stop dairy x 14 days if no improvement or change then restart, stop gluten for same if no improvement restart etc)

## 2024-06-06 NOTE — Telephone Encounter (Signed)
 FYI Only or Action Required?: Action required by provider: request for appointment.  Patient was last seen in primary care on 05/19/2024 by Franchot Isaiah LABOR, MD.  Called Nurse Triage reporting Vomiting.  Symptoms began a week ago.  Interventions attempted: Nothing.  Symptoms are: unchanged. States started Wegovy  a few weeks ago and has had mild vomiting 2-3 x week, nausea. Thinks the medicine is causing this. Requests appointment today.  Triage Disposition: See Physician Within 24 Hours  Patient/caregiver understands and will follow disposition?: Yes       Reason for Triage: pt is currently taking wegoovy she is experiencing nausea and feels sick. Vomited 3x within a week  Reason for Disposition  [1] MILD or MODERATE vomiting AND [2] present > 48 hours (2 days)  (Exception: Mild vomiting with associated diarrhea.)  Answer Assessment - Initial Assessment Questions 1. VOMITING SEVERITY: How many times have you vomited in the past 24 hours?      1 2. ONSET: When did the vomiting begin?      2 weeks ago 3. FLUIDS: What fluids or food have you vomited up today? Have you been able to keep any fluids down?     Drinking well 4. ABDOMEN PAIN: Are your having any abdomen pain? If Yes : How bad is it and what does it feel like? (e.g., crampy, dull, intermittent, constant)      no 5. DIARRHEA: Is there any diarrhea? If Yes, ask: How many times today?      Very mild 6. CONTACTS: Is there anyone else in the family with the same symptoms?      no 7. CAUSE: What do you think is causing your vomiting?     Wegovy  8. HYDRATION STATUS: Any signs of dehydration? (e.g., dry mouth [not only dry lips], too weak to stand) When did you last urinate?     no 9. OTHER SYMPTOMS: Do you have any other symptoms? (e.g., fever, headache, vertigo, vomiting blood or coffee grounds, recent head injury)     nausea 10. PREGNANCY: Is there any chance you are pregnant? When was your  last menstrual period?       no  Protocols used: Vomiting-A-AH

## 2024-06-06 NOTE — Progress Notes (Signed)
 "  Virtual Visit via Video note  I connected with Tamara Mcclain on 06/06/24 at home by video and verified that I am speaking with the correct person using two identifiers.The provider, Ginger Patrick, FNP is located in their home at time of visit.  I discussed the limitations, risks, security and privacy concerns of performing an evaluation and management service by video and the availability of in person appointments. I also discussed with the patient that there may be a patient responsible charge related to this service. The patient expressed understanding and agreed to proceed.  Subjective: PCP: Tamara Mliss FALCON, FNP  Chief Complaint  Patient presents with   Acute Visit    Reports nausea and vomiting x5-6 days. Is currenlty on wegovy .    HPI  Discussed the use of AI scribe software for clinical note transcription with the patient, who gave verbal consent to proceed.  History of Present Illness  Obesity, started wegovy  0.25 mg about 1.5 months ago, is currently on her 6th dose. She states she has been tolerating it well until about a week or two ago. Slightly over a week ago she woke up and was throwing up yellow bile, this was a day after her wegovy  shot. Two days ago she had vomited up yellow bile again, and again this am. And she had again had wegovy  the night before this restarted again. Denies fever or body aches. No congestion or sore throat or ear pain.   She states she has always had stomach problems. She would wake up and throw up clear or yellow bile in elementary school 'I have had this my entire life'.   She is not currently taking oxycodone  at this time as she has run out, she maybe was taking this last week but unsure but she does report she has not had any constipation side effects.   She states no matter what she eats she will have to have a bowel movement right after (sometimes diarrhea, sometimes soft), this started about two months ago. She does notice abdominal  cramping throughout the day but usually right before she needs to have a bowel movement. Sometimes the cramping will be so bad she will be hunched over in pain but once she goes to the bathroom she is fine afterwards.   She is trying to 'eat better' but doesn't feel she is doing a 'great job with it'. It has decreased her appetite so her portions are not nearly as much as it was. Denies heartburn, denies increased flatulence.       ROS: Per HPI Current Medications[1]  Observations/Objective: Physical Exam Constitutional:      General: She is not in acute distress.    Appearance: Normal appearance. She is not ill-appearing.  Pulmonary:     Effort: Pulmonary effort is normal.  Neurological:     General: No focal deficit present.     Mental Status: She is alert and oriented to person, place, and time.  Psychiatric:        Mood and Affect: Mood normal.        Behavior: Behavior normal.        Thought Content: Thought content normal.     Assessment and Plan: Bilious vomiting with nausea Assessment & Plan: Refill sent for zofran  4 mg prn  Suspect glp 1 related with wegovy  Advised pt to d/c  Follow a gallbladder friendly diet   Monitor for s/s pancreatitis, constipation/obstruction  If sx persist and or worsen then advised to  see pcp  If this were to occur could consider cholecytitis, obstruction, gerd, appendicitis although would most likely correlate with additional symptoms.  Do not suspect viral/bacterial due to nature of symptoms   Advised low fodmap diet and to try an elimination diet (stop dairy x 14 days if no improvement or change then restart, stop gluten for same if no improvement restart etc)   Epiploic appendagitis -     Ondansetron ; Take 1 tablet (4 mg total) by mouth every 8 (eight) hours as needed for nausea or vomiting.  Dispense: 30 tablet; Refill: 0  Class 2 severe obesity due to excess calories with serious comorbidity and body mass index (BMI) of 36.0 to  36.9 in adult    Follow Up Instructions: Return for f/u PCP if no improvement in symptoms.   I discussed the assessment and treatment plan with the patient. The patient was provided an opportunity to ask questions and all were answered. The patient agreed with the plan and demonstrated an understanding of the instructions.   The patient was advised to call back or seek an in-person evaluation if the symptoms worsen or if the condition fails to improve as anticipated.  The above assessment and management plan was discussed with the patient. The patient verbalized understanding of and has agreed to the management plan. Patient is aware to call the clinic if symptoms persist or worsen. Patient is aware when to return to the clinic for a follow-up visit. Patient educated on when it is appropriate to go to the emergency department.     Ginger Patrick, MSN, APRN, FNP-C [name]    [1]  Current Outpatient Medications:    albuterol  (VENTOLIN  HFA) 108 (90 Base) MCG/ACT inhaler, Inhale 1-2 puffs into the lungs every 6 (six) hours as needed for wheezing or shortness of breath., Disp: 8 g, Rfl: 2   busPIRone  (BUSPAR ) 10 MG tablet, Take 1 tablet (10 mg total) by mouth 2 (two) times daily., Disp: 180 tablet, Rfl: 0   DULoxetine  (CYMBALTA ) 30 MG capsule, Take 1 capsule (30 mg total) by mouth daily., Disp: 90 capsule, Rfl: 1   gabapentin  (NEURONTIN ) 100 MG capsule, Take 1 capsule (100 mg total) by mouth 3 (three) times daily. Begin by taking at night and then gradually increase as tolerated up to 3x/day, Disp: 90 capsule, Rfl: 2   ibuprofen  (ADVIL ) 200 MG tablet, Take 400-600 mg by mouth every 8 (eight) hours as needed (pain.)., Disp: , Rfl:    levonorgestrel  (MIRENA ) 20 MCG/DAY IUD, 1 each by Intrauterine route once., Disp: , Rfl:    methocarbamol  (ROBAXIN ) 500 MG tablet, Take 1 tablet (500 mg total) by mouth every 6 (six) hours as needed for muscle spasms., Disp: 120 tablet,  Rfl: 0   oxyCODONE  (OXY IR/ROXICODONE ) 5 MG immediate release tablet, Take 5 mg by mouth every 4 (four) hours as needed for severe pain (pain score 7-10)., Disp: , Rfl:    oxyCODONE  (ROXICODONE ) 5 MG immediate release tablet, Take 1 tablet (5 mg total) by mouth every 8 (eight) hours as needed., Disp: 10 tablet, Rfl: 0   ondansetron  (ZOFRAN -ODT) 4 MG disintegrating tablet, Take 1 tablet (4 mg total) by mouth every 8 (eight) hours as needed for nausea or vomiting., Disp: 30 tablet, Rfl: 0  "

## 2024-06-07 ENCOUNTER — Ambulatory Visit: Payer: Self-pay | Admitting: Nurse Practitioner

## 2024-06-07 ENCOUNTER — Ambulatory Visit (INDEPENDENT_AMBULATORY_CARE_PROVIDER_SITE_OTHER): Admitting: Nurse Practitioner

## 2024-06-07 ENCOUNTER — Encounter: Payer: Self-pay | Admitting: Nurse Practitioner

## 2024-06-07 ENCOUNTER — Ambulatory Visit
Admission: RE | Admit: 2024-06-07 | Discharge: 2024-06-07 | Disposition: A | Discharge status: Home / Self Care | Source: Ambulatory Visit | Attending: Nurse Practitioner | Admitting: Nurse Practitioner

## 2024-06-07 ENCOUNTER — Other Ambulatory Visit: Payer: Self-pay

## 2024-06-07 VITALS — BP 136/88 | HR 103 | Temp 98.0°F | Ht 60.0 in | Wt 180.0 lb

## 2024-06-07 DIAGNOSIS — R1031 Right lower quadrant pain: Secondary | ICD-10-CM | POA: Diagnosis not present

## 2024-06-07 DIAGNOSIS — R1011 Right upper quadrant pain: Secondary | ICD-10-CM

## 2024-06-07 LAB — POCT URINE DIPSTICK
Bilirubin, UA: NEGATIVE
Blood, UA: NEGATIVE
Glucose, UA: NEGATIVE mg/dL
Ketones, POC UA: NEGATIVE mg/dL
Leukocytes, UA: NEGATIVE
Nitrite, UA: NEGATIVE
POC PROTEIN,UA: >=300 — AB
Spec Grav, UA: 1.025
Urobilinogen, UA: 0.2 U/dL
pH, UA: 5

## 2024-06-07 LAB — POCT URINE PREGNANCY: Preg Test, Ur: NEGATIVE

## 2024-06-07 MED ORDER — DICYCLOMINE HCL 10 MG PO CAPS
10.0000 mg | ORAL_CAPSULE | Freq: Three times a day (TID) | ORAL | 0 refills | Status: AC
  Filled 2024-06-07: qty 40, 10d supply, fill #0

## 2024-06-07 MED ORDER — IOHEXOL 300 MG/ML  SOLN
100.0000 mL | Freq: Once | INTRAMUSCULAR | Status: AC | PRN
  Administered 2024-06-07: 100 mL via INTRAVENOUS

## 2024-06-07 NOTE — Progress Notes (Signed)
 "  BP 136/88   Pulse (!) 103   Temp 98 F (36.7 C)   Ht 5' (1.524 m)   Wt 180 lb (81.6 kg)   SpO2 99%   BMI 35.15 kg/m    Subjective:    Patient ID: Tamara Mcclain, female    DOB: June 29, 2000, 24 y.o.   MRN: [id number]  HPI: Tamara Mcclain is a 24 y.o. female  Chief Complaint  Patient presents with   Abdominal Pain    Pt c/o stomach pain x1 day. Eating increased pain.    Discussed the use of AI scribe software for clinical note transcription with the patient, who gave verbal consent to proceed.  History of Present Illness Tamara Mcclain is a 24 year old female who presents with severe abdominal pain.  Abdominal pain - Severe pain began last night - Pain is diffuse, involving both upper and lower abdomen - Pain worsens after eating - Pain intensity after eating this morning was similar to last night  Nausea and vomiting - Nausea and vomiting present for the past five to six days - Vomited last night due to pain - Continues to experience nausea and vomiting despite starting Zofran  after a telemedicine appointment yesterday  Gynecologic history - IUD in place - Irregular menstrual periods  Associated symptoms and review of systems - No fever - No diarrhea - No urinary problems  Medications - Currently taking Wegovy  - Started Zofran  for nausea and vomiting         04/28/2024    8:02 AM 04/12/2024    9:04 AM 03/29/2024    7:40 AM  Depression screen PHQ 2/9  Decreased Interest 0 0 0  Down, Depressed, Hopeless 2 0 0  PHQ - 2 Score 2 0 0  Altered sleeping 0 0 0  Tired, decreased energy 2 0 0  Change in appetite 1 0 0  Feeling bad or failure about yourself  0 0 0  Trouble concentrating 0 0 0  Moving slowly or fidgety/restless 0 0 0  Suicidal thoughts 0 0 0  PHQ-9 Score 5 0 0  Difficult doing work/chores Somewhat difficult  Not difficult at all    Relevant past medical, surgical, family and social history reviewed and updated as  indicated. Interim medical history since our last visit reviewed. Allergies and medications reviewed and updated.  Review of Systems  Ten systems reviewed and is negative except as mentioned in HPI      Objective:      BP 136/88   Pulse (!) 103   Temp 98 F (36.7 C)   Ht 5' (1.524 m)   Wt 180 lb (81.6 kg)   SpO2 99%   BMI 35.15 kg/m    Wt Readings from Last 3 Encounters:  06/07/24 180 lb (81.6 kg)  05/19/24 180 lb 11.2 oz (82 kg)  05/16/24 175 lb 3.2 oz (79.5 kg)    Physical Exam GENERAL: Alert, cooperative, well developed, no acute distress HEENT: Normocephalic, normal oropharynx, moist mucous membranes CHEST: Clear to auscultation bilaterally, no wheezes, rhonchi, or crackles CARDIOVASCULAR: Normal heart rate and rhythm, S1 and S2 normal without murmurs ABDOMEN: Soft, very tender in the right lower quadrant, tenderness in the right upper quadrant, rebound tenderness in the right lower quadrant, non-distended, without organomegaly, normal bowel sounds EXTREMITIES: No cyanosis or edema NEUROLOGICAL: Cranial nerves grossly intact, moves all extremities without gross motor or sensory deficit  Results for orders placed or performed in visit on 06/07/24  POCT URINE DIPSTICK   Collection Time: 06/07/24  1:34 PM  Result Value Ref Range   Color, UA yellow yellow   Clarity, UA clear clear   Glucose, UA negative negative mg/dL   Bilirubin, UA negative negative   Ketones, POC UA negative negative mg/dL   Spec Grav, UA 8.974 8.989 - 1.025   Blood, UA negative negative   pH, UA 5.0 5.0 - 8.0   POC PROTEIN,UA >=300 (A) negative, trace   Urobilinogen, UA 0.2 0.2 or 1.0 E.U./dL   Nitrite, UA Negative Negative   Leukocytes, UA Negative Negative  POCT urine pregnancy   Collection Time: 06/07/24  1:35 PM  Result Value Ref Range   Preg Test, Ur Negative Negative          Assessment & Plan:   Problem List Items Addressed This Visit   None Visit Diagnoses       Right  lower quadrant abdominal pain    -  Primary   Relevant Orders   CBC with Differential/Platelet   Comprehensive metabolic panel with GFR   Lipase   CT ABDOMEN PELVIS W CONTRAST   POCT URINE DIPSTICK (Completed)   POCT urine pregnancy (Completed)     Right upper quadrant abdominal pain            Assessment and Plan Assessment & Plan Acute abdominal pain Severe acute abdominal pain, primarily in the right lower quadrant with tenderness in the right upper quadrant. Pain exacerbated by eating, with associated vomiting. No fever, diarrhea, or urinary symptoms. Differential diagnosis includes peritoneal irritation, given the location and nature of the pain, and rebound tenderness. - Ordered CT scan of the abdomen with contrast - Advised against eating or drinking until further evaluation - Discussed option of outpatient imaging versus ER visit, she prefers outpatient        Follow up plan: Return if symptoms worsen or fail to improve. "

## 2024-06-08 ENCOUNTER — Other Ambulatory Visit: Payer: Self-pay | Admitting: Nurse Practitioner

## 2024-06-08 ENCOUNTER — Ambulatory Visit
Admission: RE | Admit: 2024-06-08 | Discharge: 2024-06-08 | Disposition: A | Discharge status: Home / Self Care | Source: Ambulatory Visit | Attending: Nurse Practitioner | Admitting: Nurse Practitioner

## 2024-06-08 ENCOUNTER — Ambulatory Visit: Payer: Self-pay | Admitting: Nurse Practitioner

## 2024-06-08 DIAGNOSIS — R1011 Right upper quadrant pain: Secondary | ICD-10-CM

## 2024-06-08 DIAGNOSIS — R1031 Right lower quadrant pain: Secondary | ICD-10-CM

## 2024-06-08 LAB — COMPREHENSIVE METABOLIC PANEL WITH GFR
AG Ratio: 1.6 (calc) (ref 1.0–2.5)
ALT: 17 U/L (ref 6–29)
AST: 18 U/L (ref 10–30)
Albumin: 4.6 g/dL (ref 3.6–5.1)
Alkaline phosphatase (APISO): 85 U/L (ref 31–125)
BUN: 8 mg/dL (ref 7–25)
CO2: 26 mmol/L (ref 20–32)
Calcium: 10 mg/dL (ref 8.6–10.2)
Chloride: 106 mmol/L (ref 98–110)
Creat: 0.81 mg/dL (ref 0.50–0.96)
Globulin: 2.8 g/dL (ref 1.9–3.7)
Glucose, Bld: 88 mg/dL (ref 65–99)
Potassium: 4.4 mmol/L (ref 3.5–5.3)
Sodium: 142 mmol/L (ref 135–146)
Total Bilirubin: 2 mg/dL — ABNORMAL HIGH (ref 0.2–1.2)
Total Protein: 7.4 g/dL (ref 6.1–8.1)
eGFR: 104 mL/min/{1.73_m2}

## 2024-06-08 LAB — CBC WITH DIFFERENTIAL/PLATELET
Absolute Lymphocytes: 2305 {cells}/uL (ref 850–3900)
Absolute Monocytes: 842 {cells}/uL (ref 200–950)
Basophils Absolute: 41 {cells}/uL (ref 0–200)
Basophils Relative: 0.3 %
Eosinophils Absolute: 331 {cells}/uL (ref 15–500)
Eosinophils Relative: 2.4 %
HCT: 46.2 % — ABNORMAL HIGH (ref 35.9–46.0)
Hemoglobin: 15 g/dL (ref 11.7–15.5)
MCH: 27.4 pg (ref 27.0–33.0)
MCHC: 32.5 g/dL (ref 31.6–35.4)
MCV: 84.5 fL (ref 81.4–101.7)
MPV: 10.9 fL (ref 7.5–12.5)
Monocytes Relative: 6.1 %
Neutro Abs: 10281 {cells}/uL — ABNORMAL HIGH (ref 1500–7800)
Neutrophils Relative %: 74.5 %
Platelets: 388 10*3/uL (ref 140–400)
RBC: 5.47 Million/uL — ABNORMAL HIGH (ref 3.80–5.10)
RDW: 13.1 % (ref 11.0–15.0)
Total Lymphocyte: 16.7 %
WBC: 13.8 10*3/uL — ABNORMAL HIGH (ref 3.8–10.8)

## 2024-06-08 LAB — LIPASE: Lipase: 13 U/L (ref 7–60)

## 2024-06-13 ENCOUNTER — Other Ambulatory Visit: Payer: Self-pay

## 2024-06-13 DIAGNOSIS — R1012 Left upper quadrant pain: Secondary | ICD-10-CM

## 2024-06-13 DIAGNOSIS — R1114 Bilious vomiting: Secondary | ICD-10-CM

## 2024-06-14 ENCOUNTER — Encounter: Payer: Self-pay | Admitting: Family Medicine

## 2024-06-21 ENCOUNTER — Ambulatory Visit: Admitting: Student in an Organized Health Care Education/Training Program

## 2024-06-26 ENCOUNTER — Other Ambulatory Visit

## 2024-06-27 ENCOUNTER — Ambulatory Visit: Admitting: Family Medicine

## 2024-07-27 ENCOUNTER — Ambulatory Visit: Admitting: Nurse Practitioner

## 2025-05-15 ENCOUNTER — Encounter: Admitting: Nurse Practitioner
